# Patient Record
Sex: Male | Born: 1935 | Race: White | Hispanic: No | Marital: Married | Smoking: Former smoker
Health system: Southern US, Community
[De-identification: ages and names within clinical notes are randomized; demographics above are authoritative.]

## PROBLEM LIST (undated history)

## (undated) DIAGNOSIS — N390 Urinary tract infection, site not specified: Secondary | ICD-10-CM

## (undated) DIAGNOSIS — R079 Chest pain, unspecified: Secondary | ICD-10-CM

## (undated) DIAGNOSIS — N12 Tubulo-interstitial nephritis, not specified as acute or chronic: Secondary | ICD-10-CM

## (undated) DIAGNOSIS — I219 Acute myocardial infarction, unspecified: Secondary | ICD-10-CM

## (undated) DIAGNOSIS — L0233 Carbuncle of buttock: Secondary | ICD-10-CM

## (undated) DIAGNOSIS — I2510 Atherosclerotic heart disease of native coronary artery without angina pectoris: Secondary | ICD-10-CM

## (undated) DIAGNOSIS — L723 Sebaceous cyst: Secondary | ICD-10-CM

## (undated) DIAGNOSIS — M109 Gout, unspecified: Secondary | ICD-10-CM

## (undated) DIAGNOSIS — N179 Acute kidney failure, unspecified: Secondary | ICD-10-CM

## (undated) DIAGNOSIS — C4490 Unspecified malignant neoplasm of skin, unspecified: Secondary | ICD-10-CM

## (undated) DIAGNOSIS — F329 Major depressive disorder, single episode, unspecified: Secondary | ICD-10-CM

## (undated) DIAGNOSIS — I714 Abdominal aortic aneurysm, without rupture, unspecified: Secondary | ICD-10-CM

## (undated) DIAGNOSIS — M2550 Pain in unspecified joint: Secondary | ICD-10-CM

## (undated) DIAGNOSIS — N99528 Other complication of other external stoma of urinary tract: Secondary | ICD-10-CM

## (undated) DIAGNOSIS — C61 Malignant neoplasm of prostate: Secondary | ICD-10-CM

## (undated) DIAGNOSIS — K439 Ventral hernia without obstruction or gangrene: Secondary | ICD-10-CM

## (undated) DIAGNOSIS — L539 Erythematous condition, unspecified: Secondary | ICD-10-CM

## (undated) DIAGNOSIS — G609 Hereditary and idiopathic neuropathy, unspecified: Secondary | ICD-10-CM

## (undated) DIAGNOSIS — L210 Seborrhea capitis: Secondary | ICD-10-CM

## (undated) DIAGNOSIS — R609 Edema, unspecified: Secondary | ICD-10-CM

## (undated) DIAGNOSIS — K432 Incisional hernia without obstruction or gangrene: Secondary | ICD-10-CM

## (undated) DIAGNOSIS — G629 Polyneuropathy, unspecified: Secondary | ICD-10-CM

## (undated) DIAGNOSIS — A6920 Lyme disease, unspecified: Secondary | ICD-10-CM

## (undated) DIAGNOSIS — H259 Unspecified age-related cataract: Secondary | ICD-10-CM

## (undated) DIAGNOSIS — K9409 Other complications of colostomy: Secondary | ICD-10-CM

## (undated) DIAGNOSIS — J449 Chronic obstructive pulmonary disease, unspecified: Secondary | ICD-10-CM

## (undated) DIAGNOSIS — F32A Depression, unspecified: Secondary | ICD-10-CM

## (undated) DIAGNOSIS — K4090 Unilateral inguinal hernia, without obstruction or gangrene, not specified as recurrent: Secondary | ICD-10-CM

## (undated) DIAGNOSIS — L409 Psoriasis, unspecified: Secondary | ICD-10-CM

## (undated) DIAGNOSIS — D696 Thrombocytopenia, unspecified: Secondary | ICD-10-CM

## (undated) DIAGNOSIS — J069 Acute upper respiratory infection, unspecified: Secondary | ICD-10-CM

## (undated) DIAGNOSIS — IMO0002 Reserved for concepts with insufficient information to code with codable children: Secondary | ICD-10-CM

## (undated) DIAGNOSIS — N360 Urethral fistula: Secondary | ICD-10-CM

## (undated) DIAGNOSIS — M545 Low back pain: Secondary | ICD-10-CM

## (undated) DIAGNOSIS — R5381 Other malaise: Secondary | ICD-10-CM

## (undated) DIAGNOSIS — K5730 Diverticulosis of large intestine without perforation or abscess without bleeding: Secondary | ICD-10-CM

## (undated) DIAGNOSIS — R634 Abnormal weight loss: Secondary | ICD-10-CM

## (undated) DIAGNOSIS — R5383 Other fatigue: Secondary | ICD-10-CM

## (undated) DIAGNOSIS — E785 Hyperlipidemia, unspecified: Secondary | ICD-10-CM

## (undated) DIAGNOSIS — R001 Bradycardia, unspecified: Secondary | ICD-10-CM

## (undated) DIAGNOSIS — N321 Vesicointestinal fistula: Secondary | ICD-10-CM

## (undated) HISTORY — DX: Thrombocytopenia, unspecified: D69.6

## (undated) HISTORY — DX: Acute upper respiratory infection, unspecified: J06.9

## (undated) HISTORY — DX: Acute kidney failure, unspecified: N17.9

## (undated) HISTORY — DX: Incisional hernia without obstruction or gangrene: K43.2

## (undated) HISTORY — DX: Acute myocardial infarction, unspecified: I21.9

## (undated) HISTORY — DX: Abdominal aortic aneurysm, without rupture: I71.4

## (undated) HISTORY — DX: Other fatigue: R53.83

## (undated) HISTORY — DX: Diverticulosis of large intestine without perforation or abscess without bleeding: K57.30

## (undated) HISTORY — DX: Atherosclerotic heart disease of native coronary artery without angina pectoris: I25.10

## (undated) HISTORY — DX: Gout, unspecified: M10.9

## (undated) HISTORY — DX: Other malaise: R53.81

## (undated) HISTORY — DX: Pain in unspecified joint: M25.50

## (undated) HISTORY — DX: Erythematous condition, unspecified: L53.9

## (undated) HISTORY — DX: Vesicointestinal fistula: N32.1

## (undated) HISTORY — DX: Abnormal weight loss: R63.4

## (undated) HISTORY — DX: Edema, unspecified: R60.9

## (undated) HISTORY — DX: Carbuncle of buttock: L02.33

## (undated) HISTORY — DX: Ventral hernia without obstruction or gangrene: K43.9

## (undated) HISTORY — DX: Unilateral inguinal hernia, without obstruction or gangrene, not specified as recurrent: K40.90

## (undated) HISTORY — DX: Abdominal aortic aneurysm, without rupture, unspecified: I71.40

## (undated) HISTORY — DX: Sebaceous cyst: L72.3

## (undated) HISTORY — DX: Unspecified age-related cataract: H25.9

## (undated) HISTORY — DX: Lyme disease, unspecified: A69.20

## (undated) HISTORY — PX: EYE SURGERY: SHX253

## (undated) HISTORY — DX: Low back pain: M54.5

## (undated) HISTORY — DX: Seborrhea capitis: L21.0

## (undated) HISTORY — DX: Reserved for concepts with insufficient information to code with codable children: IMO0002

## (undated) HISTORY — DX: Tubulo-interstitial nephritis, not specified as acute or chronic: N12

## (undated) HISTORY — DX: Other complication of incontinent external stoma of urinary tract: N99.528

## (undated) HISTORY — DX: Chest pain, unspecified: R07.9

## (undated) HISTORY — DX: Hereditary and idiopathic neuropathy, unspecified: G60.9

---
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
## 1968-11-29 HISTORY — PX: HERNIA REPAIR: SHX51

## 1987-11-30 DIAGNOSIS — A6920 Lyme disease, unspecified: Secondary | ICD-10-CM

## 1987-11-30 HISTORY — DX: Lyme disease, unspecified: A69.20

## 1995-11-30 DIAGNOSIS — I219 Acute myocardial infarction, unspecified: Secondary | ICD-10-CM

## 1995-11-30 HISTORY — DX: Acute myocardial infarction, unspecified: I21.9

## 1996-08-10 DIAGNOSIS — I2510 Atherosclerotic heart disease of native coronary artery without angina pectoris: Secondary | ICD-10-CM

## 1996-08-10 HISTORY — DX: Atherosclerotic heart disease of native coronary artery without angina pectoris: I25.10

## 1998-04-29 ENCOUNTER — Ambulatory Visit (HOSPITAL_COMMUNITY): Admission: RE | Admit: 1998-04-29 | Discharge: 1998-04-29 | Payer: Self-pay | Admitting: Internal Medicine

## 1998-05-08 ENCOUNTER — Ambulatory Visit (HOSPITAL_COMMUNITY): Admission: RE | Admit: 1998-05-08 | Discharge: 1998-05-08 | Payer: Self-pay | Admitting: Internal Medicine

## 1999-03-27 ENCOUNTER — Other Ambulatory Visit: Admission: RE | Admit: 1999-03-27 | Discharge: 1999-03-27 | Payer: Self-pay | Admitting: Urology

## 1999-04-08 ENCOUNTER — Encounter: Admission: RE | Admit: 1999-04-08 | Discharge: 1999-06-29 | Payer: Self-pay | Admitting: Radiation Oncology

## 1999-06-30 ENCOUNTER — Encounter: Admission: RE | Admit: 1999-06-30 | Discharge: 1999-09-28 | Payer: Self-pay | Admitting: Radiation Oncology

## 1999-07-07 ENCOUNTER — Encounter: Payer: Self-pay | Admitting: Urology

## 1999-07-07 ENCOUNTER — Ambulatory Visit (HOSPITAL_BASED_OUTPATIENT_CLINIC_OR_DEPARTMENT_OTHER): Admission: RE | Admit: 1999-07-07 | Discharge: 1999-07-07 | Payer: Self-pay | Admitting: Urology

## 1999-07-28 ENCOUNTER — Ambulatory Visit (HOSPITAL_COMMUNITY): Admission: RE | Admit: 1999-07-28 | Discharge: 1999-07-28 | Payer: Self-pay | Admitting: Radiation Oncology

## 1999-08-10 ENCOUNTER — Ambulatory Visit (HOSPITAL_COMMUNITY): Admission: RE | Admit: 1999-08-10 | Discharge: 1999-08-10 | Payer: Self-pay | Admitting: Radiation Oncology

## 1999-12-09 ENCOUNTER — Encounter: Admission: RE | Admit: 1999-12-09 | Discharge: 2000-03-08 | Payer: Self-pay | Admitting: Radiation Oncology

## 1999-12-15 ENCOUNTER — Ambulatory Visit (HOSPITAL_BASED_OUTPATIENT_CLINIC_OR_DEPARTMENT_OTHER): Admission: RE | Admit: 1999-12-15 | Discharge: 1999-12-15 | Payer: Self-pay | Admitting: Urology

## 1999-12-15 ENCOUNTER — Encounter: Payer: Self-pay | Admitting: Urology

## 2000-07-07 ENCOUNTER — Ambulatory Visit (HOSPITAL_COMMUNITY): Admission: RE | Admit: 2000-07-07 | Discharge: 2000-07-07 | Payer: Self-pay | Admitting: Gastroenterology

## 2000-08-10 DIAGNOSIS — G609 Hereditary and idiopathic neuropathy, unspecified: Secondary | ICD-10-CM

## 2000-08-10 HISTORY — DX: Hereditary and idiopathic neuropathy, unspecified: G60.9

## 2000-11-29 DIAGNOSIS — N321 Vesicointestinal fistula: Secondary | ICD-10-CM

## 2000-11-29 HISTORY — PX: ILEOSTOMY: SHX1783

## 2000-11-29 HISTORY — DX: Vesicointestinal fistula: N32.1

## 2000-11-29 HISTORY — PX: OTHER SURGICAL HISTORY: SHX169

## 2000-12-01 ENCOUNTER — Ambulatory Visit (HOSPITAL_COMMUNITY): Admission: RE | Admit: 2000-12-01 | Discharge: 2000-12-01 | Payer: Self-pay | Admitting: Gastroenterology

## 2000-12-23 ENCOUNTER — Ambulatory Visit (HOSPITAL_COMMUNITY): Admission: RE | Admit: 2000-12-23 | Discharge: 2000-12-23 | Payer: Self-pay | Admitting: Gastroenterology

## 2001-01-11 ENCOUNTER — Ambulatory Visit (HOSPITAL_COMMUNITY): Admission: RE | Admit: 2001-01-11 | Discharge: 2001-01-11 | Payer: Self-pay | Admitting: Gastroenterology

## 2001-02-20 ENCOUNTER — Encounter: Admission: RE | Admit: 2001-02-20 | Discharge: 2001-05-21 | Payer: Self-pay | Admitting: Anesthesiology

## 2001-03-12 ENCOUNTER — Encounter: Payer: Self-pay | Admitting: Emergency Medicine

## 2001-03-12 ENCOUNTER — Emergency Department (HOSPITAL_COMMUNITY): Admission: EM | Admit: 2001-03-12 | Discharge: 2001-03-12 | Payer: Self-pay | Admitting: Emergency Medicine

## 2001-03-20 ENCOUNTER — Ambulatory Visit (HOSPITAL_COMMUNITY): Admission: RE | Admit: 2001-03-20 | Discharge: 2001-03-20 | Payer: Self-pay | Admitting: Gastroenterology

## 2001-03-20 ENCOUNTER — Encounter (INDEPENDENT_AMBULATORY_CARE_PROVIDER_SITE_OTHER): Payer: Self-pay

## 2001-03-28 ENCOUNTER — Encounter (INDEPENDENT_AMBULATORY_CARE_PROVIDER_SITE_OTHER): Payer: Self-pay | Admitting: Specialist

## 2001-03-29 ENCOUNTER — Encounter: Payer: Self-pay | Admitting: Urology

## 2001-03-29 ENCOUNTER — Inpatient Hospital Stay (HOSPITAL_COMMUNITY): Admission: EM | Admit: 2001-03-29 | Discharge: 2001-04-01 | Payer: Self-pay | Admitting: Emergency Medicine

## 2001-06-17 ENCOUNTER — Emergency Department (HOSPITAL_COMMUNITY): Admission: EM | Admit: 2001-06-17 | Discharge: 2001-06-17 | Payer: Self-pay | Admitting: Emergency Medicine

## 2001-06-29 ENCOUNTER — Inpatient Hospital Stay (HOSPITAL_COMMUNITY): Admission: RE | Admit: 2001-06-29 | Discharge: 2001-07-20 | Payer: Self-pay | Admitting: Urology

## 2001-06-29 ENCOUNTER — Encounter (INDEPENDENT_AMBULATORY_CARE_PROVIDER_SITE_OTHER): Payer: Self-pay | Admitting: Specialist

## 2001-06-29 ENCOUNTER — Encounter: Payer: Self-pay | Admitting: Urology

## 2001-07-03 ENCOUNTER — Encounter: Payer: Self-pay | Admitting: *Deleted

## 2001-07-04 HISTORY — PX: CARDIAC CATHETERIZATION: SHX172

## 2001-07-05 ENCOUNTER — Encounter: Payer: Self-pay | Admitting: Urology

## 2001-07-06 ENCOUNTER — Encounter: Payer: Self-pay | Admitting: Anesthesiology

## 2001-07-08 ENCOUNTER — Encounter: Payer: Self-pay | Admitting: Pulmonary Disease

## 2001-07-25 ENCOUNTER — Encounter: Payer: Self-pay | Admitting: General Surgery

## 2001-07-25 ENCOUNTER — Inpatient Hospital Stay (HOSPITAL_COMMUNITY): Admission: EM | Admit: 2001-07-25 | Discharge: 2001-08-25 | Payer: Self-pay | Admitting: Emergency Medicine

## 2001-07-26 ENCOUNTER — Encounter: Payer: Self-pay | Admitting: General Surgery

## 2001-07-27 ENCOUNTER — Encounter: Payer: Self-pay | Admitting: General Surgery

## 2001-07-28 ENCOUNTER — Encounter: Payer: Self-pay | Admitting: General Surgery

## 2001-07-29 ENCOUNTER — Encounter: Payer: Self-pay | Admitting: General Surgery

## 2001-07-30 ENCOUNTER — Encounter: Payer: Self-pay | Admitting: General Surgery

## 2001-08-01 ENCOUNTER — Encounter: Payer: Self-pay | Admitting: General Surgery

## 2001-08-02 ENCOUNTER — Encounter: Payer: Self-pay | Admitting: General Surgery

## 2001-08-03 ENCOUNTER — Encounter: Payer: Self-pay | Admitting: General Surgery

## 2001-08-03 HISTORY — PX: COLOSTOMY: SHX63

## 2001-08-05 ENCOUNTER — Encounter: Payer: Self-pay | Admitting: General Surgery

## 2001-08-08 ENCOUNTER — Encounter: Payer: Self-pay | Admitting: Urology

## 2001-08-21 ENCOUNTER — Encounter: Payer: Self-pay | Admitting: Urology

## 2003-09-25 DIAGNOSIS — M545 Low back pain, unspecified: Secondary | ICD-10-CM

## 2003-09-25 DIAGNOSIS — R609 Edema, unspecified: Secondary | ICD-10-CM

## 2003-09-25 HISTORY — DX: Low back pain, unspecified: M54.50

## 2003-09-25 HISTORY — DX: Edema, unspecified: R60.9

## 2005-02-11 DIAGNOSIS — K5730 Diverticulosis of large intestine without perforation or abscess without bleeding: Secondary | ICD-10-CM

## 2005-02-11 HISTORY — DX: Diverticulosis of large intestine without perforation or abscess without bleeding: K57.30

## 2005-06-04 ENCOUNTER — Inpatient Hospital Stay (HOSPITAL_COMMUNITY): Admission: EM | Admit: 2005-06-04 | Discharge: 2005-06-09 | Payer: Self-pay | Admitting: Emergency Medicine

## 2005-08-30 DIAGNOSIS — M2550 Pain in unspecified joint: Secondary | ICD-10-CM

## 2005-08-30 HISTORY — DX: Pain in unspecified joint: M25.50

## 2006-02-18 DIAGNOSIS — H259 Unspecified age-related cataract: Secondary | ICD-10-CM

## 2006-02-18 HISTORY — DX: Unspecified age-related cataract: H25.9

## 2006-07-22 ENCOUNTER — Emergency Department (HOSPITAL_COMMUNITY): Admission: EM | Admit: 2006-07-22 | Discharge: 2006-07-22 | Payer: Self-pay | Admitting: Emergency Medicine

## 2007-09-22 DIAGNOSIS — R5381 Other malaise: Secondary | ICD-10-CM

## 2007-09-22 HISTORY — DX: Other malaise: R53.81

## 2007-11-30 HISTORY — PX: OTHER SURGICAL HISTORY: SHX169

## 2008-02-15 DIAGNOSIS — M109 Gout, unspecified: Secondary | ICD-10-CM

## 2008-02-15 HISTORY — DX: Gout, unspecified: M10.9

## 2008-03-20 DIAGNOSIS — K432 Incisional hernia without obstruction or gangrene: Secondary | ICD-10-CM

## 2008-03-20 HISTORY — DX: Incisional hernia without obstruction or gangrene: K43.2

## 2008-11-29 HISTORY — PX: OTHER SURGICAL HISTORY: SHX169

## 2008-12-04 DIAGNOSIS — K439 Ventral hernia without obstruction or gangrene: Secondary | ICD-10-CM

## 2008-12-04 HISTORY — DX: Ventral hernia without obstruction or gangrene: K43.9

## 2009-01-08 DIAGNOSIS — L0233 Carbuncle of buttock: Secondary | ICD-10-CM

## 2009-01-08 HISTORY — DX: Carbuncle of buttock: L02.33

## 2009-01-22 DIAGNOSIS — L723 Sebaceous cyst: Secondary | ICD-10-CM

## 2009-01-22 HISTORY — DX: Sebaceous cyst: L72.3

## 2009-10-02 DIAGNOSIS — J069 Acute upper respiratory infection, unspecified: Secondary | ICD-10-CM

## 2009-10-02 HISTORY — DX: Acute upper respiratory infection, unspecified: J06.9

## 2009-12-04 ENCOUNTER — Inpatient Hospital Stay (HOSPITAL_COMMUNITY): Admission: EM | Admit: 2009-12-04 | Discharge: 2009-12-22 | Payer: Self-pay | Admitting: Emergency Medicine

## 2010-10-31 IMAGING — CR DG ABDOMEN 2V
4 series · 4 of 4 positions shown · non-contrast
Comparison: Abdomen films of 12/14/2009

CLINICAL DATA: Abdominal pain, small bowel obstruction

ABDOMEN - 2 VIEW

[w abdomen upright *]
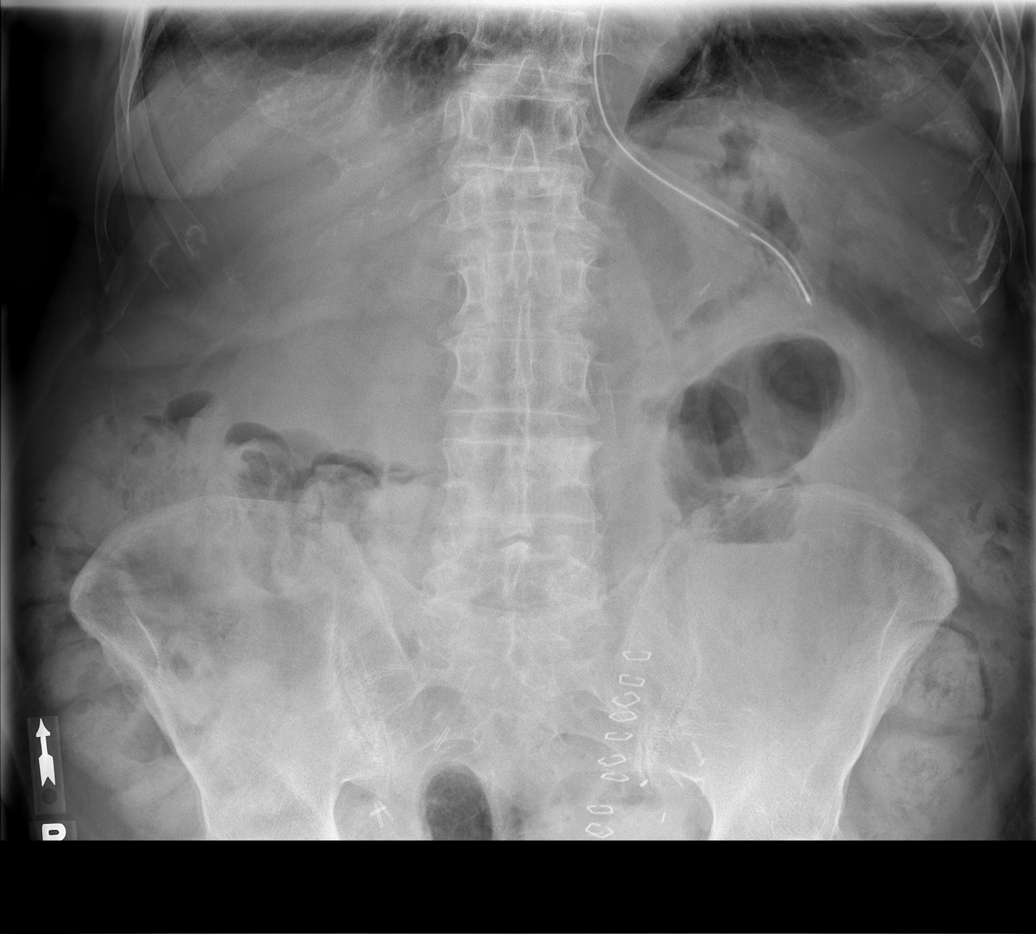

[t abdomen supine (1 of 2)]
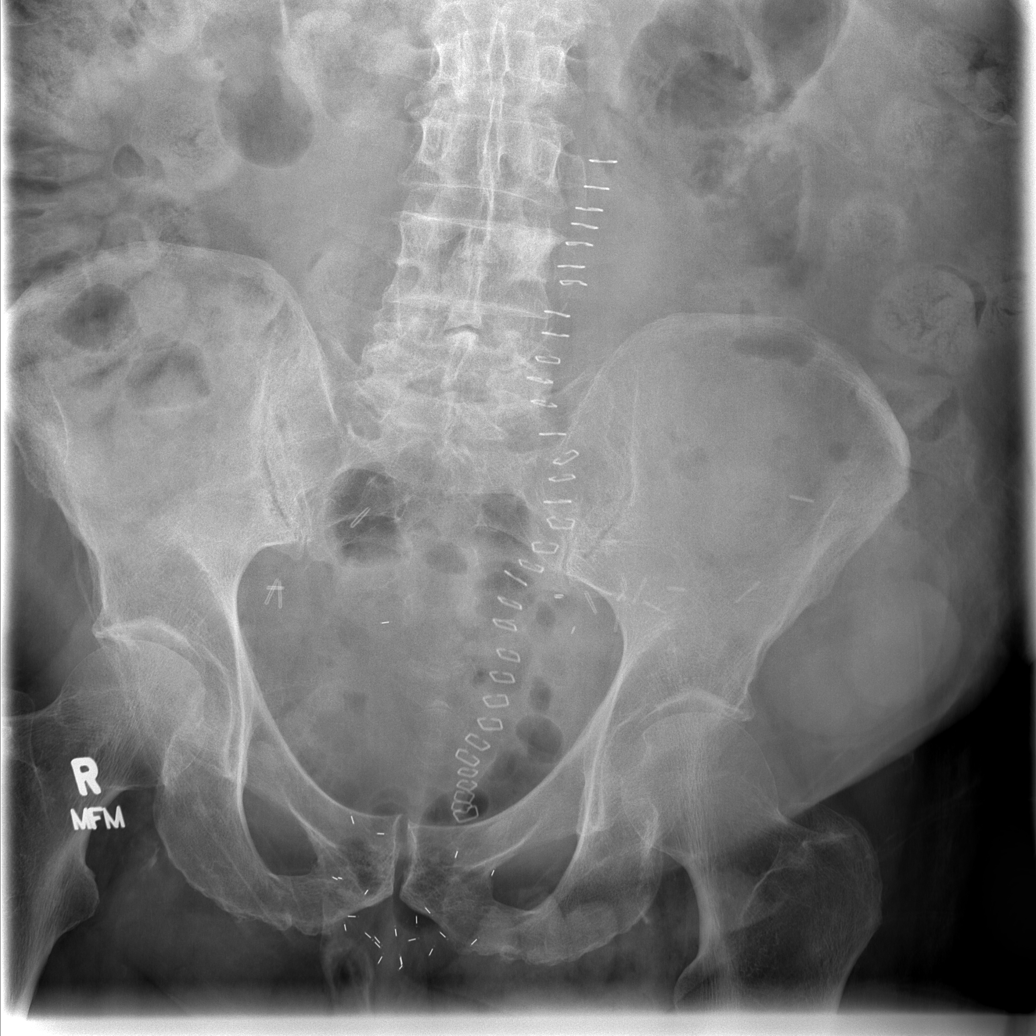

[t abdomen supine (2 of 2)]
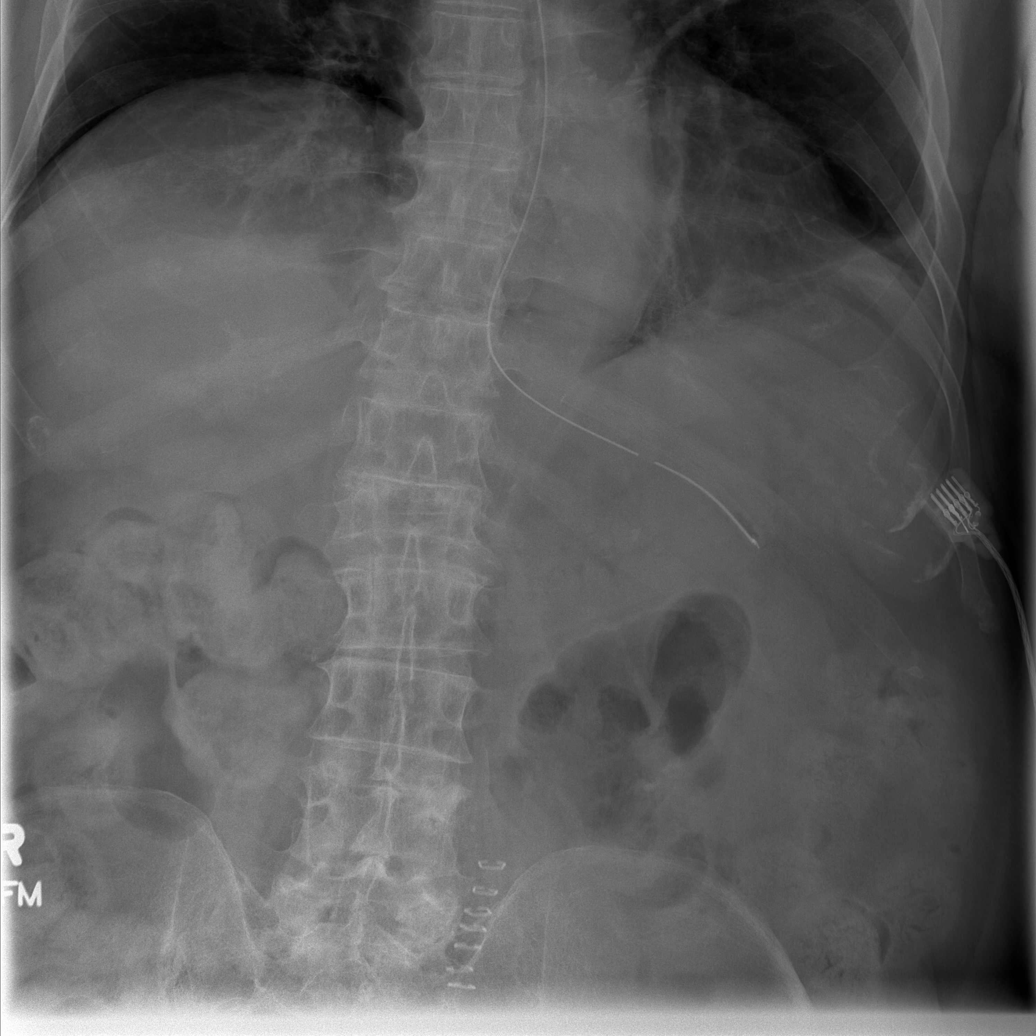

[view not recorded]
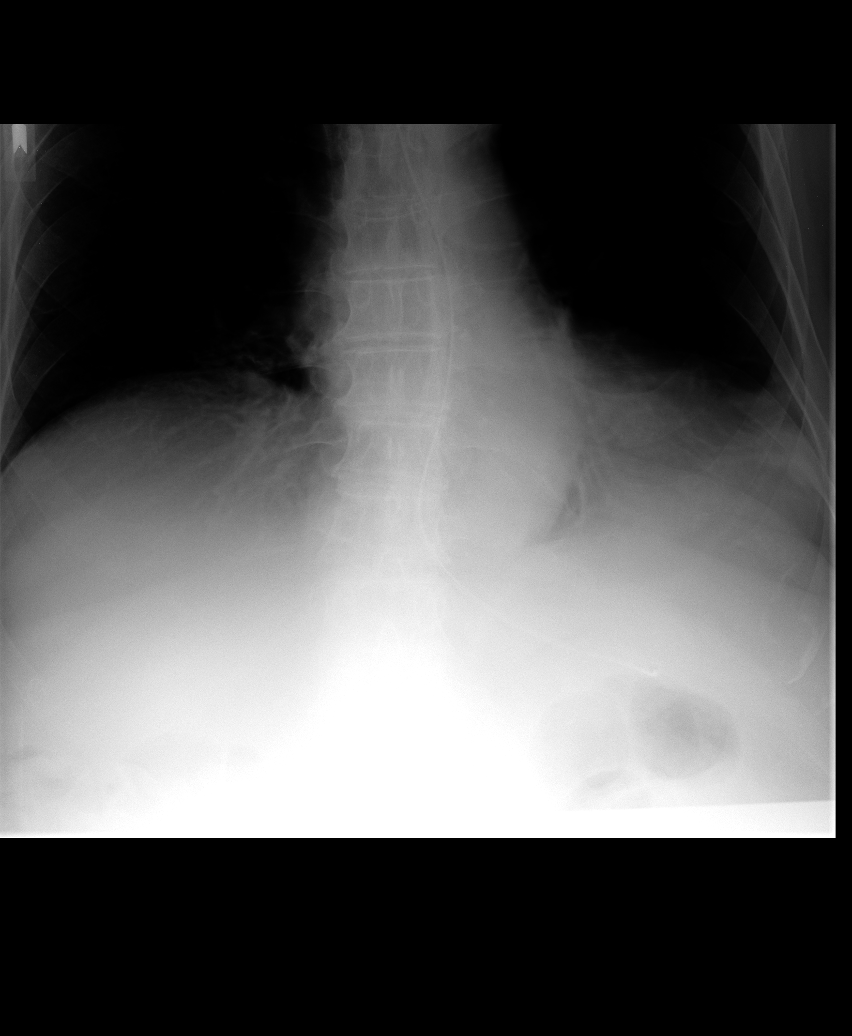

[4 of 4 positions shown; findings below may reference images not displayed]

FINDINGS: An NG tube remains with the tip in the proximal body of
the stomach.  No evidence of small bowel obstruction is currently
seen.  A small amount of colonic bowel gas is present, with feces
noted throughout the entire colon.  Surgical staples are noted in
the mid pelvis and lower abdomen with prostate implant seeds noted
in the mid pelvis as well.  Mild bibasilar linear atelectasis is
present.
IMPRESSION: NG tube remains with tip in the proximal body of the stomach.
Resolution of SBO.  No free air.

## 2011-02-13 LAB — CBC
HCT: 38.6 % — ABNORMAL LOW (ref 39.0–52.0)
HCT: 42.2 % (ref 39.0–52.0)
HCT: 43.1 % (ref 39.0–52.0)
Hemoglobin: 12.4 g/dL — ABNORMAL LOW (ref 13.0–17.0)
Hemoglobin: 12.9 g/dL — ABNORMAL LOW (ref 13.0–17.0)
Hemoglobin: 13.3 g/dL (ref 13.0–17.0)
Hemoglobin: 14.1 g/dL (ref 13.0–17.0)
Hemoglobin: 14.6 g/dL (ref 13.0–17.0)
Hemoglobin: 17 g/dL (ref 13.0–17.0)
MCHC: 33.4 g/dL (ref 30.0–36.0)
MCHC: 33.6 g/dL (ref 30.0–36.0)
MCHC: 33.7 g/dL (ref 30.0–36.0)
MCHC: 33.7 g/dL (ref 30.0–36.0)
MCHC: 33.9 g/dL (ref 30.0–36.0)
MCV: 96.2 fL (ref 78.0–100.0)
MCV: 96.4 fL (ref 78.0–100.0)
Platelets: 153 10*3/uL (ref 150–400)
Platelets: 161 10*3/uL (ref 150–400)
RBC: 3.89 MIL/uL — ABNORMAL LOW (ref 4.22–5.81)
RBC: 4.38 MIL/uL (ref 4.22–5.81)
RBC: 4.49 MIL/uL (ref 4.22–5.81)
RBC: 4.51 MIL/uL (ref 4.22–5.81)
RDW: 13.7 % (ref 11.5–15.5)
RDW: 13.8 % (ref 11.5–15.5)
RDW: 13.8 % (ref 11.5–15.5)
RDW: 13.9 % (ref 11.5–15.5)
RDW: 13.9 % (ref 11.5–15.5)
RDW: 14.1 % (ref 11.5–15.5)
WBC: 5.9 10*3/uL (ref 4.0–10.5)
WBC: 6 10*3/uL (ref 4.0–10.5)
WBC: 8.8 10*3/uL (ref 4.0–10.5)

## 2011-02-13 LAB — TYPE AND SCREEN
ABO/RH(D): AB POS
Antibody Screen: NEGATIVE

## 2011-02-13 LAB — LIPID PANEL
Cholesterol: 118 mg/dL (ref 0–200)
HDL: 45 mg/dL (ref >39)
LDL Cholesterol: 53 mg/dL (ref 0–99)
Total CHOL/HDL Ratio: 2.6 RATIO
Triglycerides: 102 mg/dL (ref <150)
VLDL: 20 mg/dL (ref 0–40)

## 2011-02-13 LAB — COMPREHENSIVE METABOLIC PANEL
ALT: 14 U/L (ref 0–53)
AST: 22 U/L (ref 0–37)
AST: 30 U/L (ref 0–37)
Albumin: 4.5 g/dL (ref 3.5–5.2)
Alkaline Phosphatase: 24 U/L — ABNORMAL LOW (ref 39–117)
Alkaline Phosphatase: 46 U/L (ref 39–117)
CO2: 28 mEq/L (ref 19–32)
CO2: 30 mEq/L (ref 19–32)
Creatinine, Ser: 1.45 mg/dL (ref 0.4–1.5)
GFR calc Af Amer: 57 mL/min — ABNORMAL LOW (ref >60)
GFR calc Af Amer: >60 mL/min (ref >60)
Glucose, Bld: 157 mg/dL — ABNORMAL HIGH (ref 70–99)
Potassium: 3.3 mEq/L — ABNORMAL LOW (ref 3.5–5.1)
Potassium: 4.6 mEq/L (ref 3.5–5.1)
Sodium: 139 mEq/L (ref 135–145)
Total Bilirubin: 0.9 mg/dL (ref 0.3–1.2)
Total Protein: 5.2 g/dL — ABNORMAL LOW (ref 6.0–8.3)

## 2011-02-13 LAB — URINALYSIS, ROUTINE W REFLEX MICROSCOPIC
Bilirubin Urine: NEGATIVE
Specific Gravity, Urine: 1.029 (ref 1.005–1.030)

## 2011-02-13 LAB — DIFFERENTIAL
Basophils Absolute: 0 10*3/uL (ref 0.0–0.1)
Basophils Absolute: 0 10*3/uL (ref 0.0–0.1)
Basophils Relative: 0 % (ref 0–1)
Eosinophils Absolute: 0 10*3/uL (ref 0.0–0.7)
Eosinophils Absolute: 0 10*3/uL (ref 0.0–0.7)
Eosinophils Relative: 0 % (ref 0–5)
Eosinophils Relative: 0 % (ref 0–5)
Eosinophils Relative: 1 % (ref 0–5)
Lymphocytes Relative: 13 % (ref 12–46)
Lymphocytes Relative: 8 % — ABNORMAL LOW (ref 12–46)
Lymphs Abs: 0.7 10*3/uL (ref 0.7–4.0)
Lymphs Abs: 0.8 10*3/uL (ref 0.7–4.0)
Lymphs Abs: 1 10*3/uL (ref 0.7–4.0)
Monocytes Relative: 5 % (ref 3–12)
Monocytes Relative: 9 % (ref 3–12)
Neutrophils Relative %: 70 % (ref 43–77)
Neutrophils Relative %: 73 % (ref 43–77)
Neutrophils Relative %: 87 % — ABNORMAL HIGH (ref 43–77)

## 2011-02-13 LAB — BASIC METABOLIC PANEL
BUN: 23 mg/dL (ref 6–23)
CO2: 26 mEq/L (ref 19–32)
CO2: 27 mEq/L (ref 19–32)
CO2: 27 mEq/L (ref 19–32)
CO2: 28 mEq/L (ref 19–32)
CO2: 33 mEq/L — ABNORMAL HIGH (ref 19–32)
Calcium: 8 mg/dL — ABNORMAL LOW (ref 8.4–10.5)
Calcium: 8 mg/dL — ABNORMAL LOW (ref 8.4–10.5)
Calcium: 8.2 mg/dL — ABNORMAL LOW (ref 8.4–10.5)
Calcium: 8.7 mg/dL (ref 8.4–10.5)
Calcium: 8.7 mg/dL (ref 8.4–10.5)
Calcium: 8.7 mg/dL (ref 8.4–10.5)
Chloride: 103 mEq/L (ref 96–112)
Chloride: 105 mEq/L (ref 96–112)
Chloride: 107 mEq/L (ref 96–112)
Creatinine, Ser: 1.32 mg/dL (ref 0.4–1.5)
Creatinine, Ser: 1.35 mg/dL (ref 0.4–1.5)
Creatinine, Ser: 1.45 mg/dL (ref 0.4–1.5)
GFR calc Af Amer: 55 mL/min — ABNORMAL LOW (ref >60)
GFR calc Af Amer: 57 mL/min — ABNORMAL LOW (ref >60)
GFR calc Af Amer: >60 mL/min (ref >60)
GFR calc Af Amer: >60 mL/min (ref >60)
GFR calc Af Amer: >60 mL/min (ref >60)
GFR calc Af Amer: >60 mL/min (ref >60)
GFR calc non Af Amer: 51 mL/min — ABNORMAL LOW (ref >60)
GFR calc non Af Amer: 53 mL/min — ABNORMAL LOW (ref >60)
GFR calc non Af Amer: 55 mL/min — ABNORMAL LOW (ref >60)
GFR calc non Af Amer: >60 mL/min (ref >60)
Glucose, Bld: 104 mg/dL — ABNORMAL HIGH (ref 70–99)
Glucose, Bld: 124 mg/dL — ABNORMAL HIGH (ref 70–99)
Glucose, Bld: 146 mg/dL — ABNORMAL HIGH (ref 70–99)
Glucose, Bld: 146 mg/dL — ABNORMAL HIGH (ref 70–99)
Glucose, Bld: 147 mg/dL — ABNORMAL HIGH (ref 70–99)
Potassium: 3.6 mEq/L (ref 3.5–5.1)
Potassium: 3.7 mEq/L (ref 3.5–5.1)
Potassium: 3.8 mEq/L (ref 3.5–5.1)
Sodium: 138 mEq/L (ref 135–145)
Sodium: 139 mEq/L (ref 135–145)
Sodium: 139 mEq/L (ref 135–145)
Sodium: 139 mEq/L (ref 135–145)
Sodium: 140 mEq/L (ref 135–145)
Sodium: 141 mEq/L (ref 135–145)

## 2011-02-13 LAB — TRIGLYCERIDES: Triglycerides: 100 mg/dL (ref <150)

## 2011-02-13 LAB — GLUCOSE, CAPILLARY
Glucose-Capillary: 106 mg/dL — ABNORMAL HIGH (ref 70–99)
Glucose-Capillary: 115 mg/dL — ABNORMAL HIGH (ref 70–99)
Glucose-Capillary: 116 mg/dL — ABNORMAL HIGH (ref 70–99)
Glucose-Capillary: 120 mg/dL — ABNORMAL HIGH (ref 70–99)
Glucose-Capillary: 121 mg/dL — ABNORMAL HIGH (ref 70–99)
Glucose-Capillary: 121 mg/dL — ABNORMAL HIGH (ref 70–99)
Glucose-Capillary: 122 mg/dL — ABNORMAL HIGH (ref 70–99)
Glucose-Capillary: 134 mg/dL — ABNORMAL HIGH (ref 70–99)
Glucose-Capillary: 135 mg/dL — ABNORMAL HIGH (ref 70–99)
Glucose-Capillary: 136 mg/dL — ABNORMAL HIGH (ref 70–99)
Glucose-Capillary: 137 mg/dL — ABNORMAL HIGH (ref 70–99)
Glucose-Capillary: 142 mg/dL — ABNORMAL HIGH (ref 70–99)
Glucose-Capillary: 93 mg/dL (ref 70–99)

## 2011-02-13 LAB — PROTIME-INR: INR: 1.14 (ref 0.00–1.49)

## 2011-02-13 LAB — HEMOGLOBIN A1C
Hgb A1c MFr Bld: 5.9 % (ref 4.6–6.1)
Mean Plasma Glucose: 123 mg/dL

## 2011-02-13 LAB — PREALBUMIN: Prealbumin: 10.4 mg/dL — ABNORMAL LOW (ref 18.0–45.0)

## 2011-02-14 LAB — BASIC METABOLIC PANEL
BUN: 24 mg/dL — ABNORMAL HIGH (ref 6–23)
BUN: 26 mg/dL — ABNORMAL HIGH (ref 6–23)
BUN: 30 mg/dL — ABNORMAL HIGH (ref 6–23)
CO2: 28 mEq/L (ref 19–32)
CO2: 29 mEq/L (ref 19–32)
Calcium: 8.3 mg/dL — ABNORMAL LOW (ref 8.4–10.5)
Calcium: 8.3 mg/dL — ABNORMAL LOW (ref 8.4–10.5)
Calcium: 8.5 mg/dL (ref 8.4–10.5)
Calcium: 8.7 mg/dL (ref 8.4–10.5)
Calcium: 8.9 mg/dL (ref 8.4–10.5)
Chloride: 108 mEq/L (ref 96–112)
Chloride: 99 mEq/L (ref 96–112)
Creatinine, Ser: 0.97 mg/dL (ref 0.4–1.5)
Creatinine, Ser: 0.99 mg/dL (ref 0.4–1.5)
Creatinine, Ser: 1.12 mg/dL (ref 0.4–1.5)
Creatinine, Ser: 1.34 mg/dL (ref 0.4–1.5)
GFR calc Af Amer: >60 mL/min (ref >60)
GFR calc Af Amer: >60 mL/min (ref >60)
GFR calc Af Amer: >60 mL/min (ref >60)
GFR calc Af Amer: >60 mL/min (ref >60)
GFR calc non Af Amer: >60 mL/min (ref >60)
GFR calc non Af Amer: >60 mL/min (ref >60)
GFR calc non Af Amer: >60 mL/min (ref >60)
GFR calc non Af Amer: >60 mL/min (ref >60)
GFR calc non Af Amer: >60 mL/min (ref >60)
Glucose, Bld: 125 mg/dL — ABNORMAL HIGH (ref 70–99)
Glucose, Bld: 164 mg/dL — ABNORMAL HIGH (ref 70–99)
Glucose, Bld: 194 mg/dL — ABNORMAL HIGH (ref 70–99)
Glucose, Bld: 194 mg/dL — ABNORMAL HIGH (ref 70–99)
Potassium: 3.9 mEq/L (ref 3.5–5.1)
Potassium: 4.4 mEq/L (ref 3.5–5.1)
Sodium: 133 mEq/L — ABNORMAL LOW (ref 135–145)
Sodium: 137 mEq/L (ref 135–145)
Sodium: 140 mEq/L (ref 135–145)

## 2011-02-14 LAB — GLUCOSE, CAPILLARY
Glucose-Capillary: 106 mg/dL — ABNORMAL HIGH (ref 70–99)
Glucose-Capillary: 112 mg/dL — ABNORMAL HIGH (ref 70–99)
Glucose-Capillary: 113 mg/dL — ABNORMAL HIGH (ref 70–99)
Glucose-Capillary: 114 mg/dL — ABNORMAL HIGH (ref 70–99)
Glucose-Capillary: 117 mg/dL — ABNORMAL HIGH (ref 70–99)
Glucose-Capillary: 118 mg/dL — ABNORMAL HIGH (ref 70–99)
Glucose-Capillary: 125 mg/dL — ABNORMAL HIGH (ref 70–99)
Glucose-Capillary: 126 mg/dL — ABNORMAL HIGH (ref 70–99)
Glucose-Capillary: 127 mg/dL — ABNORMAL HIGH (ref 70–99)
Glucose-Capillary: 129 mg/dL — ABNORMAL HIGH (ref 70–99)
Glucose-Capillary: 130 mg/dL — ABNORMAL HIGH (ref 70–99)
Glucose-Capillary: 130 mg/dL — ABNORMAL HIGH (ref 70–99)
Glucose-Capillary: 136 mg/dL — ABNORMAL HIGH (ref 70–99)
Glucose-Capillary: 138 mg/dL — ABNORMAL HIGH (ref 70–99)
Glucose-Capillary: 138 mg/dL — ABNORMAL HIGH (ref 70–99)
Glucose-Capillary: 138 mg/dL — ABNORMAL HIGH (ref 70–99)
Glucose-Capillary: 138 mg/dL — ABNORMAL HIGH (ref 70–99)
Glucose-Capillary: 139 mg/dL — ABNORMAL HIGH (ref 70–99)
Glucose-Capillary: 139 mg/dL — ABNORMAL HIGH (ref 70–99)
Glucose-Capillary: 140 mg/dL — ABNORMAL HIGH (ref 70–99)
Glucose-Capillary: 142 mg/dL — ABNORMAL HIGH (ref 70–99)
Glucose-Capillary: 143 mg/dL — ABNORMAL HIGH (ref 70–99)
Glucose-Capillary: 148 mg/dL — ABNORMAL HIGH (ref 70–99)
Glucose-Capillary: 148 mg/dL — ABNORMAL HIGH (ref 70–99)
Glucose-Capillary: 156 mg/dL — ABNORMAL HIGH (ref 70–99)
Glucose-Capillary: 169 mg/dL — ABNORMAL HIGH (ref 70–99)
Glucose-Capillary: 170 mg/dL — ABNORMAL HIGH (ref 70–99)
Glucose-Capillary: 172 mg/dL — ABNORMAL HIGH (ref 70–99)
Glucose-Capillary: 188 mg/dL — ABNORMAL HIGH (ref 70–99)

## 2011-02-14 LAB — CBC
Hemoglobin: 12.5 g/dL — ABNORMAL LOW (ref 13.0–17.0)
MCHC: 33.3 g/dL (ref 30.0–36.0)
MCV: 94.7 fL (ref 78.0–100.0)
MCV: 94.9 fL (ref 78.0–100.0)
Platelets: 316 10*3/uL (ref 150–400)
RBC: 3.89 MIL/uL — ABNORMAL LOW (ref 4.22–5.81)
WBC: 6.4 10*3/uL (ref 4.0–10.5)
WBC: 7.1 10*3/uL (ref 4.0–10.5)

## 2011-02-14 LAB — DIFFERENTIAL
Lymphs Abs: 0.8 10*3/uL (ref 0.7–4.0)
Monocytes Absolute: 0.6 10*3/uL (ref 0.1–1.0)
Monocytes Relative: 10 % (ref 3–12)
Neutro Abs: 5 10*3/uL (ref 1.7–7.7)
Neutrophils Relative %: 78 % — ABNORMAL HIGH (ref 43–77)

## 2011-02-14 LAB — COMPREHENSIVE METABOLIC PANEL
ALT: 16 U/L (ref 0–53)
Albumin: 2.4 g/dL — ABNORMAL LOW (ref 3.5–5.2)
Alkaline Phosphatase: 29 U/L — ABNORMAL LOW (ref 39–117)
Alkaline Phosphatase: 30 U/L — ABNORMAL LOW (ref 39–117)
BUN: 25 mg/dL — ABNORMAL HIGH (ref 6–23)
BUN: 26 mg/dL — ABNORMAL HIGH (ref 6–23)
CO2: 26 mEq/L (ref 19–32)
CO2: 30 mEq/L (ref 19–32)
Calcium: 8.2 mg/dL — ABNORMAL LOW (ref 8.4–10.5)
Calcium: 8.3 mg/dL — ABNORMAL LOW (ref 8.4–10.5)
Chloride: 105 mEq/L (ref 96–112)
Creatinine, Ser: 1.02 mg/dL (ref 0.4–1.5)
GFR calc Af Amer: >60 mL/min (ref >60)
GFR calc non Af Amer: >60 mL/min (ref >60)
Glucose, Bld: 140 mg/dL — ABNORMAL HIGH (ref 70–99)
Glucose, Bld: 146 mg/dL — ABNORMAL HIGH (ref 70–99)
Glucose, Bld: 147 mg/dL — ABNORMAL HIGH (ref 70–99)
Potassium: 3.6 mEq/L (ref 3.5–5.1)
Potassium: 4 mEq/L (ref 3.5–5.1)
Sodium: 140 mEq/L (ref 135–145)
Total Bilirubin: 0.2 mg/dL — ABNORMAL LOW (ref 0.3–1.2)
Total Protein: 5.3 g/dL — ABNORMAL LOW (ref 6.0–8.3)
Total Protein: 5.4 g/dL — ABNORMAL LOW (ref 6.0–8.3)

## 2011-02-14 LAB — PREALBUMIN: Prealbumin: 15.9 mg/dL — ABNORMAL LOW (ref 18.0–45.0)

## 2011-02-14 LAB — PHOSPHORUS
Phosphorus: 3.6 mg/dL (ref 2.3–4.6)
Phosphorus: 3.8 mg/dL (ref 2.3–4.6)

## 2011-02-14 LAB — MAGNESIUM: Magnesium: 2.1 mg/dL (ref 1.5–2.5)

## 2011-04-16 NOTE — H&P (Signed)
[facility]  Patient:    Michael Novak, Michael Novak                      MRN: [id number] Adm. Date:  [id number] Attending:  Thyra Breed CC:         Excell Seltzer. Annabell Howells, M.D.  Titus Dubin. Alwyn Ren, M.D. [facility]   History and Physical  NEW PATIENT EVALUATION:  Michael Novak is a 77 year old gentleman who is sent to [place] by Excell Seltzer. Annabell Howells, M.D. for evaluation of rectal and penile pain.  The patients chief complaint is rectal and penile pain.  The patient states he was in his usual state of health up until he had a health maintenance evaluation by Titus Dubin. Alwyn Ren, M.D. and was found to have a prostate nodule. He was seen by Excell Seltzer. Annabell Howells, M.D. and underwent biopsies which showed cancer of the prostate. He underwent seed implantations at [facility] in August of 2000. A CT scan showed that he had incomplete coverage and had redo seed placements in January of 2001. Since then, he stated that he has had progressively increased problems with pain in his rectal area and bladder. He was initially treated with Ultram and Percocet and eventually evolved to OxyContin with Percocet, Flomax, and Vioxx. He notes that he has pain which he describes as a sense of a hot poker being pushed up his penis and a hand grenade exploding in his rectum. It is made worse with urination or defecation. He has had a lot of difficulties with constipation and hard, bulky stools. He has been using stool bulkers but not laxatives for this. Intermittently, he will use milk of magnesia, and this will extrude a lot of his discomfort. He has been back to see Barbette Hair. Arlyce Dice, M.D., his GI specialist, who states he has some scarring from the radiation therapy. Excell Seltzer. Annabell Howells, M.D. has recystoscoped him recently and demonstrated what the patient described as scarring from the implants in addition. The patient states that bowel movements are such a painful experience that he dreads them. He has some mild weakness of  his legs. He denied numbness or tingling. He denied loss of control of his bowel or bladder, but he does have severe pain and a sense of urgency with both. He states that his discomfort is made better with normal bowel movements.  He does not recall being treated with Neurontin or with amitriptyline. He is currently taking about eight Percocet a day, in addition to 20 mg of OxyContin twice a day. He has not been treated with a laxative.  The patient presents with notes from Excell Seltzer. Annabell Howells, M.D.  CURRENT MEDICATIONS: 1. Norvasc 5 mg per day. 2. Zocor 20 mg per day. 3. Meclizine which he rarely takes. 4. Aspirin 81 mg per day. 5. Paxil 30 mg per day. 6. Vioxx 25 mg a day which he states is not very helpful. 7. OxyContin 20 mg twice a day. 8. Percocet 5/325 two tablets q.4-6h. 9. Vitamin supplements, as well as Metamucil.  ALLERGIES:  AUGMENTIN.  FAMILY HISTORY:  Positive for asthma, congestive heart failure, cancer, renal failure, and hypertension.  PAST SURGICAL HISTORY:  Significant for colonoscopy, laser surgery of the rectum through sigmoidoscopy, catheterization cystoscopies, right shoulder surgery, and inguinal hernia repairs x 4.  SOCIAL HISTORY:  The patient quit smoking 2 years ago. He does not drink alcohol. He retired from the [employer] in [place] and moved to [place] to be closer to his son.  ACTIVE MEDICAL PROBLEMS:  Coronary artery disease, history of MI in 1997, hypertension, peptic ulcer disease, osteoarthritis, history of Lyme disease diagnosed in 1989 associated with arthritis but no neurologic deficits, and history of vertigo which has resolved in recent years.  REVIEW OF SYSTEMS:  GENERAL:  Significant for night sweats, especially over the past 2 weeks, but he attributes this some of his medication changes. May be related to his opiates. HEAD:  Negative. EYES: Significant for corrective lens. NOSE, MOUTH, THROAT:  Significant for dry mouth. EARS:   Significant for decreased hearing and wax build up and recurrent sinusitis. PULMONARY: Negative. CARDIOVASCULAR:  He has a history of hypertension and coronary artery disease. See active medical problems. GI:  History of peptic ulcer disease. GU:  See HPI. MUSCULOSKELETAL:  History of osteoarthritis and Lyme disease. NEUROLOGICAL:  Negative. HEMATOLOGIC:  Negative. CUTANEOUS: Negative. ENDOCRINE:  Negative. PSYCHIATRIC:  Positive for depression related to his pain. ALLERGY/IMMUNOLOGIC:  Negative.  PHYSICAL EXAMINATION:  VITAL SIGNS:  Blood pressure is 114/64, heart rate is 86, respiratory rate is 20, O2 saturation is 96%, pain level is 7/10.  GENERAL:  This is a pleasant male who is in obvious discomfort from his pain.  HEENT:  Head was normocephalic, atraumatic. Eyes:  Extraocular movements intact with conjunctivae and sclerae clear. Nose:  Patent nares with large, bulbous nose. Oropharynx was free of lesions.  NECK:  Demonstrated good range of motion with carotids 2+ and symmetric without bruits.  LUNGS:  Clear.  HEART:  Regular rate and rhythm.  ABDOMEN:  Bowel sounds present with intact abdominal reflexes.  RECTAL:  Mildly decreased rectal sphincter tone with a large amount of hard stool in the rectal vault.  GENITALIA:  Not examined since he just had cystoscopy 2 days ago.  EXTREMITIES:  Radial pulses and dorsalis pedis pulses 2+ and symmetric. He exhibited no active synovitis at the present time.  NEUROLOGICAL:  The patient was oriented x 4. Cranial nerves 2 through 12 are grossly intact. Deep tendon reflexes were symmetric in the upper and lower extremities with hyperreflexia of 3+ at the knees and ankles with five beats of clonus on the right side, three beats on the left side with down ______ . Sensory exam showing attenuated pinprick to the upper calves and attenuated vibratory sense in the lower extremities. Upper extremities were intact.  IMPRESSION:  1.  Penile and rectal pain, status post radiation therapy with associated    scarring for prostate cancer. 2. Constipation exacerbating #1. 3. Multiple other medical problems per Titus Dubin. Alwyn Ren, M.D. which include    coronary artery disease, hypertension, peptic ulcer disease,    osteoarthritis, and remote history of Lyme disease in 1989. 4. Peripheral neuropathy, etiology unclear.  DISPOSITION: 1. I advised the patient that the first order of the day was to try and get    his bowels more regulated and that using Metamucil was probably    exacerbating his underlying problem that he needed to use a stool softener    and a laxative while he is on opiates. It seems clear that some of his    problems are related to the hardness of his stools and the infrequency of    stools followed by the use of milk of magnesia which apparently gives him a    harsh evacuation. I recommended that we start on Senokot S to see whether    we could get some better results with this. 2. He will continue on his current opiates  to see whether improving his stools    will allow him to get by with less of the rescue medication. If he is    requiring eight tablets per day of Percocet on top of his OxyContin 20 mg    twice a day, then we need to go ahead and put him up to 40 mg twice a day    since he is basically taking that amount. 3. I advised him to go ahead and stop the Vioxx, since I do not really think    it is doing a whole lot of help for him. 4. I wrote him prescriptions for OxyContin 20 mg tablets, #60; and Percocet    5/325, #200. 5. We reviewed a controlled substance contract with him. 6. I will see him back in follow-up in 3 to 4 weeks. At which time, we will    address how to manipulate some of his medications. He may benefit from a    trial of Neurontin and/or tricyclic antidepressants to try and reduce some    of his discomfort.  Additional exam:  The patient demonstrated an intact pinprick sense  over his perirectal area. DD:  02/21/01 TD:  02/21/01 Job: [id number]

## 2011-04-16 NOTE — H&P (Signed)
[facility]  Patient:    Michael Novak, Michael Novak                      MRN: [id number] Adm. Date:  [id number] Attending:  Londell Moh                         History and Physical  ADMITTING DIAGNOSES:  1. 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  2. Adenocarcinoma of the prostate.  3. Past history of coronary artery disease.  4. Possible pulmonary insufficiency.  HISTORY OF PRESENT ILLNESS:  This 77 year old male developed carcinoma of the prostate and was under the care of Dr. Bjorn Pippin. The patient underwent a radiation seed implantation and was found on the postoperative CT to have a technically poor quality implant. The decision was made for him to have additional seeds implanted and since that time has had severe problems with urinary urgency, frequency, bladder pain, and rectal pain. The patient eventually went into retention and transferred to my care. The patient underwent a cystoscopy and was found to have evidence of radiation cystitis as well as some degree of bladder outlet obstruction. The obstructing tissue was removed and the patient was able to urinate; however, he eventually required reinsertion of the Foley catheter due to the severity of his pain which was definitely worsened by voiding.  The patient was referred to [facility] for hyperbaric oxygen therapy and while at [facility], the decision was made to remove his catheter because it was thought he had developed a urinary tract infection. When the patients pain got worse, the Foley catheter was reinserted and it was inserted directly into his rectum. He now has a severe GI/GU fistula. The patient did not respond to hyperbaric oxygen and was told at [facility] that he would require a pelvic exenteration. Unfortunately, they were unwilling the operate on him for at least one month and the patient is in such extreme pain that he has requested this be done locally. I have had a  chance to review these findings very carefully with the patient, with his son and with his family and they would like to go ahead and be admitted for pain management and preparation for an eventual pelvic exenteration.  PAST MEDICAL HISTORY:  Remarkable for hernia repairs, Limes disease, as well as his seed implantation. The patient is known to have had a myocardial infarction in 1996 and known to have elevated cholesterol. He generally has minimal pulmonary problems but we do want a pulmonary function tests prior to this surgery. His principle problem right now is the fact that all of his urine is coming out through his rectum.  MEDICATIONS ON ADMISSION:  Multivitamins, Megace which has helped his ______ somewhat, iron, Zocor, Ditropan, Paxil, nortriptyline, Ultram and a Duragesic patch.  SOCIAL HISTORY:  The patient denies tobacco or alcohol.  FAMILY HISTORY:  Noncontributory.  REVIEW OF SYSTEMS:  As described above.  PHYSICAL EXAMINATION:  GENERAL:  He is a very thin, cachectic male in no acute distress.  HEENT:  Normocephalic, atraumatic. Cranial nerves 2-12 appear grossly intact.  NECK:  Supple with no adenopathy, or thyromegaly.  LUNGS:  Clear.  HEART:  Regular rate and rhythm with no murmurs, thrills, gallops, rubs or heaves.  ABDOMEN:  Soft.  RECTAL:  Consistent with a fistula.  GU:  Unremarkable. Testicles normal in size, shape and consistency.  EXTREMITIES:  There is no cyanosis, clubbing or edema.  IMPRESSION:  GI/GU fistula secondary to radiation injury.  PLAN:  Pelvic exenteration with preoperative pulmonary, general surgery and cardiology consults. The patient is placed on TPN to improve his nutritional status. DD:  06/30/01 TD:  07/03/01 Job: [id number]

## 2011-04-16 NOTE — Op Note (Signed)
[facility]  Patient:    Michael Novak, Michael Novak                      MRN: [id number] Proc. Date: 07/06/01 Adm. Date:  [id number] Disc. Date: [id number] Attending:  Londell Moh CC:         Billie Lade, M.D.  Excell Seltzer. Annabell Howells, M.D.  Meade Maw, M.D.  [facility] Critical Care Management Team  Anselm Pancoast. Zachery Dakins, M.D.   Operative Report  PREOPERATIVE DIAGNOSES: 1. Rectourethral fistula, status post radiation therapy. 2. Prostate cancer.  POSTOPERATIVE DIAGNOSES: 1. Rectourethral fistula, status post radiation therapy. 2. Prostate cancer.  PROCEDURE: 1. Pelvic exenteration with ileal conduit diversion and colostomy. 2. Cystoprostatectomy and ileal conduit diversion.  Dr. Logan Bores with    Dr. Brunilda Payor assisting. 3. Anterior peritoneal resection of sigmoid with colostomy, Dr. Zachery Dakins with    Dr. Logan Bores assisting. 4. Incidental appendectomy.  COMPLICATIONS:  None.  SPECIMENS:  Bladder, prostate, seminal vesicles, and sigmoid colon.  SECONDARY SPECIMEN:  Appendix.  DRAINS: 1. NG tube. 2. Internal jugular CVP line. 3. Two #2 flat Blake drains in the pelvis. 4. Two single-j catheters within the ileostomy. 5. Colostomy. 6. 24 French Ainsworth Foley catheter per urethra functioning as a pelvic    drain.  BRIEF HISTORY:  This 77 year old male had carcinoma of the prostate and underwent radiation seed implantation by Dr. Bjorn Pippin and Dr. Arnette Schaumann. The patient had inadequate coverage of the prostate on the postradiation CT scan and had additional radiation seeds implanted.  From a cancer-control standpoint, the patient has done very well but has developed problems with urinary retention as well as severe radiation cystitis and radiation proctitis.  Because of the pain and retention, the patient underwent cystoscopy and TUR to open up the prostate.  This allowed him to urinate, but the patient had such severe pain and such difficulty that he  preferred to keep the catheter in place.  The patient was referred to [facility] for hyperbaric oxygen therapy.  At [facility], the Foley catheter was removed.  When the patient was able to urinate, the catheter was reinserted, and traumatic development of a rectourethral fistula was noted.  The patient had a suprapubic tube placed. the hyperbaric oxygen was continued, and the patient remained quite miserable.  The patient was seen at [facility] by urology as well as by general surgery and was told that a pelvic exenteration with ileal conduit diversion and colostomy was the appropriate form of therapy.  They did tell him, however, that he could not have this done until the every end of August.  This was at the end of July.  The patient was in extreme pain.  He came to my office requesting that the operation be performed in [place].  After a lengthy discussion with the family and careful delineation of the pros and cons of intervention, the decision was made to bring the patient to the hospital for pain management and eventual pelvic exenteration.  The patient was started on TPN because of low albumin and was placed on a PCA pump to try and control his pain.  A preoperative assessment by cardiology was requested.  They felt that the patient needed to undergo a Cardiolite.  The Cardiolite showed possible ischemia on the right-hand side, near the area where he had had previous myocardial infarction.  Fortunately, the cardiac catheterization came back negative.  This did force the cancellation of his scheduled surgery on Tuesday.  The patient  had pulmonary function testing and was cleared for surgery by pulmonary.  The general surgeon saw him and agreed that this was the appropriate course of therapy.  Dr. Ailene Ards. Carolynne Edouard recommended that the patient have a Gastrografin enema to determine what the rest of his bowel looked like.  The Gastrografin enema revealed the presence of a fistula and because of the size of  the fistula, no contrast material could get into the more proximal portions of the bowel.  The patient was clear for surgery after the cardiac catheterization and is now ready to undergo the planned pelvic exenteration.  The patient had been transfused with two units of packed red blood cells on two separate occasions prior to the procedure to get his hematocrit up into the optimal range.  The patients other laboratory studies are important for the fact that he does have low serum albumin as well as the fact that he has mild elevation of his bleeding parameters for reasons that are not well known but may simply be nutritional in nature.  He is certainly not been on any form of blood thinners, and all aspirin has been discontinued. The patient is now as prepared as possible for his pelvic exenteration. Antibiotics have been given.  A full bowel prep has been performed including an antibiotic bowel prep.  He gave full and informed consent.  DESCRIPTION OF PROCEDURE:  After the successful induction of general anesthesia, the patient was placed in the low lithotomy position with PAS stockings in place and Allen stirrups utilized.  The patient had a suprapubic tube in place.  The suprapubic tube was cut and tracked out, and it was ellipsed out with the incision.  An incision was made beginning at the symphysis pubis, extending around the umbilicus, and to a point just above the umbilicus.  The skin around the midline suprapubic tube site was excised and removed.  The incision was carried down through Scarpas fascia until the rectus sheath was identified.  The rectus was opened in the midline.  The posterior peritoneum was opened, and the abdomen was entered.  The incision  was opened all the way down to the symphysis pubis.  The lateral lengths of peritoneum were taken down with the Ligasure.  The cord was identified on each side.  This was clamped with Kelly clamps and was tied off and  divided.  The retroperitoneum was exposed.  The Bookwalter retractor was placed.  The ureters were identified on each side.  They were dissected free from the surrounding tissue, and they were then freed all the way down to the bladder. On each side, they were doubly clipped and divided.  On each side, the iliac artery could be identified.  This was traced down until the bifurcation.  As the internal iliac was exposed, it was tied off at the takeoff of the superior vesicle artery.  It was not divided.  On each side, the superior vesicle artery was then appropriately tied with silk sutures and divided.  The posterior dissection behind the colon allowed for development of a plane back behind the sigmoid colon.  The sigmoid and bladder were then taken as a single specimen.  The pedicles on each side were taken with the Ligasure as well as with appropriate surgical ties, tying off all the branches of the internal iliac.  This dissection was taken all the way down to the endopelvic fascia which was opened.  In the area of the prostate, sharp dissection needed to be utilized,  as the area had become quite fibrotic.  Electrocautery was used to dissect that tissue off the underside of the pubic bone.  This came out nicely.  The prostate itself was noted to be markedly abscessed, and the material there was black and completely necrotic.  The entry into the colon could be identified.  The colon was transected within the sigmoid colon up above the bowel.  The entire specimen consisted of bowel, bladder, prostate, seminal vesicles, and the distal ureter was mobilized all the way down to the rectum.  Dr. Zachery Dakins then performed the abdominoperineal portion of the procedure and removed the rectum so that the entire specimen could be taken as a single specimen.  This was sent off for pathologic evaluation.  During the procedure, a Foley catheter was left in place through the abdominal wall opening, and  this was also sent with the specimen.  During the procedure, several large lymph nodes along the iliac chain were identified.  These were sent to have frozen section and were negative.  Dr. Zachery Dakins closed the rectal area, and he will dictate all of his portions of the procedure.  As he was closing, a careful inspection of the pelvis showed that there was no bleeding and that all of the portions of the pedicle had been adequately controlled.  A section of bowel was then harvested, and the bowel was put back together in the usual fashion with the GIA and a TA 55.  Prior to reanastomosis of the bowel, the tip of the bowel was resected additionally, as it looked a little dusky.  This was carefully inspected by Dr. Zachery Dakins and was felt to be adequate.  The conduit was then constructed in the usual fashion using the single-J stents.  The single-Js were passed out through small stab incisions at the bottom of the ileal conduit and passed up into the kidney on each side.  The stents were stitched in place with a 5-0 Vicryl. The anastomosis was performed on each side with a series of sutures of 4-0 Vicryl.  Prior to the formation of the ileal conduit, the staples had been removed from the bottom of the conduit.  This was then oversewn in two layers with 2-0 Vicryl.  The ileal conduit was then matured in the usual fashion with 3-0 Vicryls in an everting fashion.  The colostomy was also matured in an identical fashion.  This was done, however, after closure.  The colostomy and everything involved with the sigmoid colon will be dictated by Dr. Zachery Dakins. A careful inspection showed that the conduit appeared to be well situated. The ureters were in the retroperitoneal position.  The anastomotic traps had been carefully closed.  The bowel was not going to pinch off the conduit in any way.  The deep pelvis was carefully inspected. It was wide open with no signs of any residual tumor.  Adequate  hemostasis had been obtained, and the area was irrigated with antibiotic solution and then drained.  It should be noted, that an incidental appendectomy was performed during the procedure. This was done in the usual fashion by clamping across the bowel, tying it off with silk sutures, and then burying it with a series of pursestring sutures of Vicryl.  That was inspected at the end of the procedure and appeared to be normal.  The final inspection showed that the conduit and colostomy were appropriately positioned.  All mesenteric traps had been closed.  The conduit and the colostomy had been secured to  the underside of the peritoneum with no openings that could allow for hernia formation.  The patient had two #10 JP drains placed, one on each side.  These were sutured in place with 2-0 silk. The Ainsworth Foley catheter was placed into the urethra.  The entire area was irrigated and drained.  The patient was then closed in two layers.  The first retroperitoneal area consisted of 0 Vicryl.  The fascial layer consisted of #1 PDS.  The incision was irrigated and then closed with surgical clips.  The patients single-Js were brought out through the conduit, and the conduit bag was applied as was the colostomy bag.  The patient tolerated the procedure well and was taken to the recovery room in good condition.  He will have his H&H checked there.  He will be taken to the intensive care unit and may not be extubated immediately. DD:  07/06/01 TD:  07/07/01 Job: [id number]

## 2011-04-16 NOTE — Cardiovascular Report (Signed)
[place]. [facility]  Patient:    MENDEL, BINSFELD                      MRN: [id number] Proc. Date: 07/04/01 Adm. Date:  [id number] Attending:  Meade Maw A CC:         Jamison Neighbor, M.D.   Cardiac Catheterization  REFERRING PHYSICIAN:  Jamison Neighbor, M.D.  INDICATIONS FOR PROCEDURE:  Reversible ischemia in the inferoapical wall prior to planned surgical procedure.  DESCRIPTION OF PROCEDURE:  After obtaining written informed consent, the patient was brought to the cardiac catheterization lab in the postabsorptive state.  Preoperative sedation was achieved using IV Versed.  The right groin was prepped and draped in the usual sterile fashion.  Local anesthesia was achieved using 1% Xylocaine.  A 6 French hemostasis sheath was placed into the right femoral artery using the modified Seldinger technique.  Selective coronary angiography was performed using a JL5 and a JR4 Judkins catheter. Nonionic contrast was used and was hand injected.  All catheter exchange were made over a guide wire.  Single plane ventriculogram was performed in the RAO position using a 6 French pigtail curved catheter.  The patient was given 3000 units of intravenous heparin prior to the initiation of the procedure.  ACT aT the completion of the procedure was 150 and he was transferred to the holding area.  Hemostasis sheath was removed.  Hemostasis was achieved using digital pressure.  FINDINGS:  The aorta pressure is 132/70, LV pressure was 131/11.  Single plane ventriculogram revealed normal wall motion with an ejection fraction of 55%.  CORONARY ANGIOGRAPHY:  All arteries were large and ectatic.  Left main coronary artery bifurcated into the left anterior descending and circumflex vessel.  There was no significant disease in the left main coronary artery.  Left anterior descending:  The left anterior descending gave rise to a small diagonal #1, diagonal #2, diagonal #3 and  went on to end as the apical recurrent branch.  There was no significant disease in the left anterior descending.  The left anterior descending was ectatic.  The circumflex vessel was a large ectatic vessel as well, gave rise to a large OM-1 and went on to end as a large AV groove vessel.  There is a suggestion that there may be a total occlusion of a prior obtuse marginal as demonstrated by the stub.  Right coronary artery was a dominant vessel.  It was also ectatic in its proximal portion and had a 50% mid vessel stenosis.  Single plane ventriculogram revealed normal wall motion.  There was no mitral regurgitation noted.  The ejection fraction was 60%.  IMPRESSION: 1. Ectatic coronary arteries. 2. A 50% right coronary artery lesion previously noted. 3. Suggestion of a first obtuse marginal which was totally occluded. 4. Preserved left ventricular function.  RECOMMENDATION:  No further cardiac work-up as required.  Would restart his aspirin as soon as possible, as ectatic vessels are prone to thrombosis. Would again minimize fluid shifts with the anticipated surgery and attempt to keep his hematocrit more than 25.  I will discuss the patient further with you. DD:  07/04/01 TD:  07/04/01 Job: [id number]

## 2011-04-16 NOTE — Discharge Summary (Signed)
[facility]  Patient:    Michael Novak, Michael Novak Visit Number: [id number] MRN: [id number]          Service Type: SUR Location: 3W [id number] [id number] Attending Physician:  Michael Novak Dictated by:   Anselm Pancoast. Zachery Dakins, M.D. Admit Date:  07/25/2001 Discharge Date: 08/25/2001   CC:         Michael Novak, M.D.  Rockey Situ. Flavia Shipper., M.D.   Discharge Summary  DISCHARGE DIAGNOSES: 1. Postoperative infection of pelvis following a cystectomy and prostatectomy    with ileal loop and end colostomy. 2. Sepsis, fungal, probably secondary to pelvic abscess, possibly    subclavian peripherally inserted central catheter (PICC) line. 3. Partial small-bowel obstruction, resolved.  SURGERY:  No operative surgery, did have a PICC line placed.  CONSULTATIONS:  Infectious disease, Michael Novak.  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  The patient had been treated, developed necrosis of the prostate and bladder with a fistula into the rectum, an in spite of conservative management, had no improvement.  The patient was having significant pelvic pain in addition to the diarrhea, etc., and a cystectomy, prostatectomy, and removal of rectum was recommended by Michael Novak, and I assisted with the procedure performed with the proctectomy on August 8.  The patient had a slow recovery but no definite signs of infection.  He originally had significant drains, both Jackson-Pratts and, of course, he was draining the prostate area through the Foley that was in the balloon in the pelvic bed. The patient was discharged approximately two days prior to this readmission. The patient had been doing well for the few days prior to going home, and then shortly after going home, he started having nausea and vomiting, became bloated.  This persisted over the  next 24 hours.  Michael Novak office was called, and they recommended that he call me since this was an intestinal problem.  The patients wife called, and I suggested I see him in he emergency room, and I did.  HOSPITAL COURSE:  The x-rays and labs showed very dilated proximal small bowel consistent with small-bowel obstruction.  There was no gas actually in the colon even though there was a moderate amount of gas within the colostomy bag when the patient was seen in the emergency room.  The wounds appeared to be healing nicely.  I placed a nasogastric tube, and about 400 cc of bilious drainage was removed, and the patient felt better.  His initial lab study showed a white count of 14,200 with hemoglobin of 12.3. His electrolytes were normal.  Sodium 131, BUN 29, creatinine 1.1.  BUN when discharge had been 12.  It was felt that he was moderately dehydrated.  There were a few bowel sounds.  Both stomas appeared pink and viable and the stents in his ureters.  His current medications had been Megace and Tylox.  His pulse was 120.  He was afebrile.  He appeared chronically ill.  He was cooperative, and I readmitted him, gave him a fluid bolus, and then set his fluids at 175 cc an hour to replace the NG loss, and contacted Michael Novak that I thought that this was a postoperative problem and asked that he also see the patient in spite of his office thinking that it was a general surgical problem.  He saw the patient and was in agreement.  Our original opinion was that we thought he had a partial small-bowel obstruction.  Suction was performed, replaced an intravenous fluids.  Originally I did not place him on antibiotics since I could not obviously demonstrate an infection.  He was afebrile.  We did put him on Pepcid and repeated a flat and upright x-ray and obtained a urine culture.  He continued to be nauseated.  His BUN and creatinine improved.  He developed a low-grade fever, and the culture  of the initial urine grew out Klebsiella greater than 100,000 colonies.  Of course, this was from an ileal loop, and the blood cultures that were originally done did not confirm positive cultures.  Michael Novak recommended starting him on Cipro which we did for the urine, and this was started on August 29.  We obtained a CT of the abdomen and pelvis on that day that showed a probable small-bowel obstruction, NG tube in place, ureteral stents in satisfactory position, mild COPD, small dilatation of the aorta.  Changes thought to be related to the surgery in the pelvis. There were a few remaining radiation seeds in the pelvic area and obvious abscess noted.  The patient, of course, had now only been in about three days, but from his previous surgery, etc., we thought that hyperalimentation was definitely needed.  We obtained a PICC line.  He was beginning to have some gas pass per colostomy, and we thought that he was improving. Hyperalimentation was started; pharmacy managed this.  The patient showed some improvement.  Still mildly dilated small-bowel loops. There was still some bile within the nasogastric tube.  With the increased colostomy output then, we could see the contrast definitely within the colon. We were thinking that this hopefully is not a complete obstruction and were encouraged.  He then, and this was about a week after surgery, started having a low-grade temperature.  Now, his bowels were beginning to work definitely better, and we had removed the nasogastric fever.  But then he spiked a fever of 103 on September 6 and was recultured.  His white count at this time did become abnormal.  We switched the Cipro to Primaxin and repeated the CT of the abdomen and pelvis.  The CT was completed in the early a.m. of September 7, and it showed now a fluid collection basically where the prostate used to be, and a Foley was placed through the penis, and about 4 ounces of white purulent,  foul-smelling material was  removed, and I irrigated this with saline and removed the Foley.  There was no communication from this to the little area of the rectal and skin incision that was still open, and I placed him on vancomycin because of the fever and thought that we possibly had a staph resistant organism.  Michael Novak saw him and thought that we could get better irrigation and drainage if we would leave the Foley in place, and he placed a another larger Foley within the prostate area and placed it to drainage.  The cultures that had been obtained and also the pelvic drainage showed Candida.  I asked for an infectious disease consult by Dr. Roxan Novak.  We removed the subclavian.  I am not sure if it was an infected catheter.  I expect the source was the pelvis and not the catheter.  Michael Novak recommended that we add Diflucan which was done.  The patient, for several days, had significant fevers and chills.  He had been transfused a couple of  units, not that we had any active bleeding, and he was in intensive care unit.  Then, after about two days of the very broad antibiotic coverage, he obviously was not having as much temperature elevation.  He appeared to be improving. Inspection of the pelvis, although he has always had significant pelvic pain, could not find a fluctuant abscess.  There was some question of whether or not we would have to do a repeat laparotomy, but with the patient improving, his temperature subsiding, and the pain decreasing, we felt that continuing the antibiotics was the wiser option instead of trying to do a pelvic exploration with some recent major surgery and the colostomy and the ileal loop, and continued the antibiotic coverage.  He was not having an elevated white count.  He slowly improved.  His appetite was improving, temperature subsiding.  Over the next about one week, he just kind of slowly improved, and the drainage through the catheter  placed through the penis into the old area where the bladder, prostate, and abscess used to be controlled things.  He was then transferred out of the unit, continued on the vancomycin for a total of two weeks, and his wounds, his pain, his everything was all improving, and consideration was being made about possibly discharging him.  The patient had a repeat CT which showed that the little fluid collection where the prostate used to be had resolved.  The Foley was still in place.  A new PICC line had been placed about 48 hours after we removed the subclavian catheter when he was obviously septic and receiving the hyperalimentation.  We first considered removing the Foley before he went home, but then Michael Novak thought that it would probably be best to leave that in and remove it in the office.  I basically removed the PICC line and made sure that he was afebrile for two days prior to letting him go home.  He is still on oral antibiotics.  DISCHARGE MEDICATIONS: 1. Cipro 500 mg 2 times a day. 2. Flagyl 500 mg 2 times a day. 3. Tylox for pain. 4. He continues on his Megace.  DISPOSITION:  He was discharged on September 26 in improved condition.  I think this was a partial bowel obstruction, but probably this was more related to a kind of smoldering pelvic infection from his previous surgery than that of a true small-bowel obstruction.  He is eating and having good colostomy and urine output at this time.  FOLLOWUP:  He will be seen in Michael Novak office in approximately five days at which time we think the Foley will be removed.  He will follow up with me in approximately two to three weeks.  He is aware that if his nausea, vomiting, fever, or has greatly increasing pain to call either my office or Michael Novak office. Dictated by:   Anselm Pancoast. Zachery Dakins, M.D. Attending Physician:  Michael Novak DD:  09/14/01 TD:  09/14/01 Job: [id number]

## 2011-04-16 NOTE — H&P (Signed)
[facility]  Patient:    Michael Novak, Michael Novak                      MRN: [id number] Adm. Date:  [id number] Attending:  Thyra Breed CC:         Excell Seltzer. Annabell Howells, M.D.  Titus Dubin. Alwyn Ren, M.D. [facility]   History and Physical  FOLLOW-UP:  [name] called to day to let me know that the lidocaine ointment is helping for about three or four hours.  He is applying it every eight hours. He developed sweats and does not feel that the Percocet is quite doing what it should be, so I advised him that I would probably switch him on over to Dilaudid, and I suspect the sweats are coming from the OxyContin, which we will have to consider taking him off of if this continues.  I advised him I would like to go ahead and add in Pamelor in a low dose.  He may ultimately need to see Dr. Alwyn Ren with regard to his high level of anxiety.  He has called me on Good Friday, the Saturday following, and then again today because of his pain, and a lot of his concerns are the anxiety associated with this. He is not interested in seeing a psychiatrist right now or a psychologist.  I will go ahead and prescribe Dilaudid 4 mg one to two p.o. q.4h. p.r.n. pain in place of the Percocet, #200, and start him on Pamelor 10 mg one p.o. q.p.m. He is already taking OxyContin 20 mg three times a day at my request.  He did get disimpacted over the weekend, but apparently he did not get up with Dr. Arlyce Dice, and Dr. Virginia Rochester, Dr. Nita Sells associate, did not offer any assistance to the patient with regard to his impaction, so he is very frustrated from that perspective.  He was able to get himself more or less freed up using a Fleets enema with the subsequent use of the lidocaine.  He is scheduled to see me back in the very near future.  He will likely need to go ahead and see Dr. Alwyn Ren for his anxiety. DD:  02/27/01 TD:  02/27/01 Job: [id number]

## 2011-04-16 NOTE — Op Note (Signed)
[facility]  Patient:    Michael, Novak                      MRN: [id number] Proc. Date: 03/29/01 Adm. Date:  [id number] Attending:  Evlyn Clines CC:         Excell Seltzer. Annabell Howells, M.D.   Operative Report  PREOPERATIVE DIAGNOSIS:  Clot urinary retention, status post seed implantation for carcinoma of the prostate.  POSTOPERATIVE DIAGNOSIS:  Clot urinary retention, status post seed implantation for carcinoma of the prostate.  OPERATION PERFORMED:  Cystoscopy, clot evacuation and transurethral resection of the prostate.  SURGEON:  Jamison Neighbor, M.D.  ANESTHESIA:  General.  COMPLICATIONS:  None.  DRAINS:  A 24 French 3-way Foley catheter.  INDICATIONS FOR PROCEDURE:  This 77 year old male has a somewhat complicated urologic history.  The patient was found to have carcinoma of the prostate in April 2000.  The patient underwent external beam radiation therapy and seed implantation performed by Dr. Bjorn Pippin in August of 2001.  The patient had numerous cold spots on his postimplant CT.  Dr. Roselind Messier recommended additional seeds be placed anteriorly.  This was performed by Dr. Annabell Howells in January of 2002.  The patient has had problems with radiation cystitis and radiation proctitis. He is known to have a rectal ulcer.  He was scheduled to go to [facility] for hyperbaric oxygen therapy.  The patient developed problems with retention on or around April 14 and had a Foley catheter placed in the emergency room.  At first his urine ran quite clear and he was very comfortable with the catheter in place.  The patient developed some clot-like material passing around the urethra and the Foley catheter was removed on March 28, 2001.  The patient did not urinate for almost 12 hours and presented to the emergency room on March 28, 2001.  A Foley catheter was inserted.  Grossly bloody urine was obtained, 900 cc was drained.  The patient still was not relieved.  The  catheter was clotted, so it was removed and a second Foley catheter was inserted.  400 cc was obtained.  He still had clots and was not empty.  Attempt to place an Ainsworth catheter was unsuccessful.  Urologic consultation was sought.  The Ainsworth catheter was eventually inserted using a ____________ guide and it was clear that the patient had a very rigid, fixed prostate following the radiation.  This did give him some relief but it clotted off as well.  The decision was made to proceed with clot evacuation and possible TURP.  The patient was told that he would have to wait until he was n.p.o. for an adequate length of time.  He is now to under a cystoscopy, clot evacuation and TURP.  These findings have been discussed with the patient, his wife and his son.  They understand that he might have problems with injury to the bladder or rectum because of the fact that he is postradiation.  He also understands that there is a very real risk for incontinence.  He also knows there may be need for additional therapy to try and stop the rectal bleeding and the bleeding per urethra.  The patient gave full and informed consent.  DESCRIPTION OF PROCEDURE:  After successful induction of general anesthesia, the patient was placed in the dorsal lithotomy position and prepped with Betadine and draped in the usual sterile fashion.  The urethra was calibrated to 30 [place] with Zenaida Niece  Buren sounds.  The Olympus continuous flow resectoscope sheath was then inserted using a Timberlake obturator.  The Stern-McCarthy resectoscope was inserted.  It was clear upon initial inspection that the entire bladder was filled with clot.  The Toomey syringe was used then used to evacuate out the clot until all clot had been removed.  The resectoscope was reinserted.  The bladder was carefully inspected.  No tumors or stones were seen.  It was difficult to completely visualize the bladder because of the fixed and fibrotic  nature of the prostate and bladder neck.  The bleeding was seen to be coming primarily from the prostatic urethra.  The bladder neck was extremely tight and this would explain why the patient had trouble urinating. The [employer] was used to split the bladder neck and then additional resection was performed.  Great care was taken to avoid excessive resection posteriorly because of the patients known rectal injury.  The tissue was felt to be very hard and fibrotic and certainly did not cut well.  Care was taken to avoid removing excessive tissue for fear of exposing a lot of seeds.  Only one seed was actually exposed. The radiation oncologists were contacted.  They will use a Geiger counter to check all of the specimen as well as the drapes, etc. to make sure that there is no activity.  It was felt that the seeds were ____________  but if one was cut it was possible that additional radiation might leak out.  At the end of the procedure, the prostate fossa was wide open.  Great care was taken to avoid excessive resection out towards the veru and the resection in that area was quite minimal.  It was known that this area was very fibrotic and certainly it was feared that the patient might have problems with postoperative stress incontinence.  There was no evidence of any injury to the rectum or to the bladder.  The ureteral orifices were not injured.  The bladder neck was not undermined.  The prostatic fossa did appear to be wide open.  All chips were irrigated from the bladder.  Final inspection showed no additional bleeding spots and there was good coagulation of everything within the prostatic fossa.  The resectoscope was removed and a Foley catheter was inserted.  This went very easily into the bladder and no longer was any restriction to passage of the catheter.  This irrigated freely. Continuous bladder irrigation was started with normal saline.  The patient tolerated the procedure well  and was taken to recovery room in good condition. He will continue with continuous bladder irrigation ____________ at 24 hours  and may be ready for a voiding trial. DD:  03/29/01 TD:  03/29/01 Job: [id number]

## 2011-04-16 NOTE — Discharge Summary (Signed)
[facility]  Patient:    Michael Novak, Michael Novak                      MRN: [id number] Adm. Date:  [id number] Disc. Date: [id number] Attending:  Londell Moh                           Discharge Summary  ADMITTING DIAGNOSES: 1. Clot urinary retention. 2. Radiation cystitis. 3. Radiation proctitis. 4. Past history of carcinoma of the prostate. 5. History of coronary artery disease.  PROCEDURE:  Cystoscopy with clot evacuation and transurethral resection of prostate on Mar 29, 2001.  HISTORY OF PRESENT ILLNESS:  This 77 year old male was found to have carcinoma of the prostate in April 2000.  The patient was treated with seed implantation and underwent this implant by Dr. Bjorn Pippin in August 2000.  Followup CT scan showed what was felt to be an incomplete implant with poor placement anterior, so the patient had additional seeds placed in January 2001.  The patients PSA has apparently dropped into normal level, but unfortunately, the patient has had a lot of problems with hematuria and what has been felt to be radiation cystitis and radiation proctitis.  He apparently underwent cystoscopy for evaluation of bleeding and has had what he describes as a laser fulguration. The patient developed problems with urinary retention and on May 14, had a Foley catheter inserted.  The patient subsequently had his catheter removed on the day of admission.  He could not urinate and developed problems with grossly bloody urine.  The patient presented to the emergency room where initial Foley catheter was placed and 900 cc of grossly bloody urine was drained.  Catheter clotted off and it was removed.  A second catheter was inserted and an additional 400 cc came out before it clotted off.  Attempts at placing and anterior Foley catheter were unsuccessful.  Urologic complications prompted admission and management of the patients problem.  PAST MEDICAL HISTORY: 1. Coronary  artery disease. 2. Small heart attack in 1997. 3. Multiple inguinal hernia repairs, but no other surgery.  The patients family history, social history and review of systems is noncontributory.  One thing that is important to note is that the patients pain is severe and he has been seen in the pain clinic by Dr. Thyra Breed.  MEDICATIONS:  1. Zocor.  2. Multivitamins.  3. Vitamin E and C.  4. Aspirin.  5. Norvasc.  6. OxyContin.  7. Nortriptyline.  8. Paxil.  9. Xylocaine. 10. Senokot. 11. Azo-Standard.  HOSPITAL COURSE:  The patient was taken to the operating room where he underwent cystoscopy and clot evacuation.  The patient had prostatic tissue trimmed in order to help him to urinate.  He did feel much more comfortable with the Foley catheter in place.  The patient had an unremarkable postoperative course.  His urine remained clear.  He had a Foley catheter removed.  On Mar 30, 2001, he failed that voiding trial.  The decision was made to send him home with the Foley catheter as he felt significantly more comfortable with the urine drain.  The patient was sent home with pain medication, antibiotics, Pyridium and B&O suppositories.  We do think the patient will likely require a hypobaric oxygen treatment at [facility] in order to try and work on his radiation proctitis.  More importantly, however, something will need to be done about his long-term urinary drainage  as the patient really has a difficult time voiding with severe pain. DD:  04/06/01 TD:  04/07/01 Job: [id number]

## 2011-04-16 NOTE — Discharge Summary (Signed)
NAME:  Michael Novak, Michael Novak NO.:  [id number]   MEDICAL RECORD NO.:  [id number]          PATIENT TYPE:  INP   LOCATION:  1402                         FACILITY:  [facility]   PHYSICIAN:  Vikki Ports, MDDATE OF BIRTH:  04/08/1930   DATE OF ADMISSION:  06/04/2005  DATE OF DISCHARGE:  06/09/2005                                 DISCHARGE SUMMARY   ADMISSION DIAGNOSIS:  Small bowel obstruction.   DISCHARGE DIAGNOSIS:  Small bowel obstruction.   CONDITION ON DISCHARGE:  Good and improved.   FOLLOW UP:  With me on p.r.n. basis.   HISTORY OF PRESENT ILLNESS:  The patient is a very pleasant 77 year old  white male who presented to the emergency room with bowel obstruction.  He  was admitted for IV hydration and clinical followup, NG tube decompression.   HOSPITAL COURSE:  The patient was admitted, had NG tube placed and IV  fluids.  By hospital day 1, the patient was doing very well, was still not  having much colostomy output but he did respond to a Fleet enema at his  ostomy.  Repeat acute abdominal series showed no evidence of small bowel  obstruction.  He was advanced to a clear liquid diet, then a regular diet  which he was tolerating well.  He is discharged to home with resolution of  his small bowel obstruction and given instructions to follow up with me  p.r.n.       KRH/MEDQ  D:  06/09/2005  T:  06/09/2005  Job:  [id number]

## 2011-04-16 NOTE — H&P (Signed)
NAME:  Michael Novak, Michael Novak NO.:  [id number]   MEDICAL RECORD NO.:  [id number]          PATIENT TYPE:  INP   LOCATION:  0104                         FACILITY:  [facility]   PHYSICIAN:  Vikki Ports, MDDATE OF BIRTH:  13-Nov-1930   DATE OF ADMISSION:  06/04/2005  DATE OF DISCHARGE:                                HISTORY & PHYSICAL   CHIEF COMPLAINT:  Nausea, vomiting, abdominal pain.   HISTORY OF PRESENT ILLNESS:  The patient is a 77 year old white male who is  now 4 years status post pelvic exoneration with ileal conduit for urinary  diversion and end colostomy by Dr. Zachery Dakins and Dr. Logan Bores. The patient  presents to the emergency room with a 2-day history of crampy abdominal  pain, low colostomy output, nausea, and two episodes of emesis.   PAST MEDICAL HISTORY:  None.   PAST SURGICAL HISTORY:  As above.   MEDICATIONS:  None.   PHYSICAL EXAMINATION:  VITAL SIGNS:  His heart rate is 99, temperature is  98.6, blood pressure is 140/72.  GENERAL:  The patient looks apparently in no distress.  HEENT:  Benign, normocephalic, atraumatic. Pupils are equal, round, reactive  to light.  NECK:  Supple and soft without thyromegaly or cervical adenopathy.  LUNGS:  Clear to auscultation and percussion x2.  HEART:  Regular rate and rhythm without murmurs, rubs, or gallops.  ABDOMEN:  Distended though nontender, with a colostomy in the left lower  quadrant and an ileal conduit in the right lower quadrant. No evidence of  colostomy output. There is urine in the ileal conduit bag. There is a  ventral hernia in the midline as well.  EXTREMITIES:  Shows normal muscular tone.   REVIEW OF SYSTEMS:  Significant for emesis and nausea and low colostomy  output. Otherwise, negative for constitutional, respiratory, cardiac,  neurologic symptomatology.   Acute abdominal series is consistent with a partial small-bowel obstruction.   IMPRESSION:  Partial small-bowel obstruction.   PLAN:  NG tube decompression and clinical follow-up. The patient has had  significant pelvic radiation and surgery would be quite difficult and I am  hoping we can manage him nonoperatively with resolution of his partial small-  bowel obstruction.       KRH/MEDQ  D:  06/04/2005  T:  06/04/2005  Job:  [id number]

## 2011-04-16 NOTE — H&P (Signed)
[facility]  Patient:    Michael Novak, Michael Novak                      MRN: [id number] Adm. Date:  [id number] Attending:  Thyra Breed CC:         Billie Lade, M.D.  Excell Seltzer. Annabell Howells, M.D.  Barbette Hair. Arlyce Dice, M.D. [facility]  Titus Dubin. Alwyn Ren, M.D. [facility]   History and Physical  FOLLOWUP EVALUATION:  Tayari comes in for followup evaluation of his rectal pain and penile pain.  Since his last evaluation, we have had to place him on lidocaine topically, which he is using on an average of two times a day, occasionally three times a day, and Pyridium.  The Pyridium was helping but unfortunately, he developed urinary retention, which is probably reflective of the opiates he was taking, as he continued on the Percocet with the Dilaudid, on top of the OxyContin.  He had to go into the hospital on Saturday and have a catheter placed; this has significantly reduced his penile pain and he has none today.  He continues to have rectal pain and has had some blood per rectum.  He plans to see Dr. Barbette Hair. Arlyce Dice tomorrow.  Dr. Billie Lade, his radiation oncologist, has recommended that he go to [facility] for hyperbaric medicine treatments and he is in the process of setting this up.  The patient does not note a great deal of improvement with the opiates at the present time, nor from the nortriptyline he is taking.  He cannot sit.  In general, the patient seems to have a much better disposition than when I spoke to him last on the phone a couple of weeks ago and he does seem to be significantly improved in my eyes, although he is still very anxious over his current situation.  He rates his pain at 7/10.  His pain in his rectal area is brought on by sitting on the area or bowel movements.  PHYSICAL EXAMINATION:  VITAL SIGNS:  Blood pressure 128/73.  Heart rate is 84.  Respiratory rate is 12.  O2 saturation is 91%.  Pain level is 7/10.  GENERAL:  His general exam is little changed  from his last visit.  IMPRESSION: 1. Rectal pain with rectal ulcer with recent bleeding.  He is currently using    topical lidocaine p.r.n. two to three times per day.  He was advised of the    potential risks of this. 2. Constipation secondary to opiates. 3. Urinary retention which I suspect is partially related to his opiates. 4. 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 5. Peripheral neuropathy. 6. Other medical problems per Dr. Titus Dubin. Hopper.  DISPOSITION: 1. Continue with the topical lidocaine but use only twice a day.  He was given    some samples of this. 2. Stop the OxyContin and the Dilaudid and use Duragesic patch 50 mcg    1 applied every 3 days, #10.  The nurses have reviewed this with him. 3. He will continue with Percocet 5/325 mg for breakthrough pain, 1 to 2 p.o.    q.6h., #200 with no refill. 4. Continue with Senokot. 5. Stop Pyridium since he has got a catheter in place. 6. Continue other medications as previously. 7. Follow up with me in four weeks.  In the meantime, he is encouraged to    follow up closely with Dr. Arlyce Dice with regard to his rectal bleeding and    with  Dr. Excell Seltzer. Wrenn with regard to his bladder problem. DD:  03/15/01 TD:  03/16/01 Job: [id number]

## 2011-04-16 NOTE — H&P (Signed)
[facility]  Patient:    Michael Novak, Michael Novak                      MRN: [id number] Adm. Date:  [id number] Attending:  Benny Lennert CC:         Excell Seltzer. Annabell Howells, M.D.   History and Physical  ADMISSION DIAGNOSIS:  Carcinoma of the prostate with clot and urinary retention.  HISTORY OF PRESENT ILLNESS:  This 77 year old male was found to have carcinoma of the prostate in April 2000.  The patient was given several options for therapy including radium seed implantation, external beam radiation therapy, and radical prostatectomy.  The patient elected to undergo radium seed implantation and underwent his first implant in August 2000.  Followup CT scan apparently showed the patient had a very incomplete implant, so he had additional seeds placed in January 2001.  According to the family, the patients PSA dropped to 1.8 but they do not know of any lower values, so it is unknown to me whether the patient had a good clinical response, insofar as cancer control.  It does appear, however, that the patient has had a lot of problems with hematuria and what sounds like radiation cystitis and possible radiation proctitis.  We note that he underwent some form of a cystoscopy with laser fulguration, which may have been for bleeding from the procedure.  The patient presented to the emergency room on April 14 in urinary retention. He had a Foley catheter placed.  The urine at that time ran clear.  He actually felt better with the Foley catheter in place as his urgency, frequency, and spasms disappeared.  The urine flowed freely.  The patient did develop a little bit of discharge around the meatus and for that reason requested the Foley catheter be removed.  The Foley was removed earlier today in Dr. Aaron Edelman office.  The patient was given antibiotics after removal of the catheter.  He was unable to urinate whatsoever and presented to the emergency room this evening.  The  emergency room staff initially put a Foley catheter and got 900 cc of what appeared to be grossly bloody urine.  That catheter clotted off and could not be irrigated.  It was removed.  A second catheter, a 22-French coude, was placed and an additional 400 cc came out before it clotted off.  They attempted to place an ______ Foley catheter without success.  The patient is to be admitted for clot evacuation and further therapy by Dr. Annabell Howells.  PAST MEDICAL HISTORY:  Otherwise remarkable for coronary artery disease.  He had what his family describes as a small heart attack in 1997.  His only other surgery, aside from the urologic procedures described above, are four different inguinal hernia repairs, the last one back in 1974.  FAMILY HISTORY:  The patients father died of old age.  His mother died of old age.  He has one sister with an abdominal cancer of unknown primary and a sister who died of a myocardial infarction.  A brother died of lung cancer, apparently from secondhand smoke, since he was a nonsmoker.  REVIEW OF SYSTEMS:  The patient describes constipation secondary to pain medication.  He has been on a significant number of pain medications since developing his urologic problem.  He has had severe pain and has been seen in the pain clinic by Dr. Thyra Breed.  ALLERGIES:  AUGMENTIN.  MEDICATIONS:  1. Zocor 20 mg daily.  2.  Multivitamin 1 daily.  3. Vitamin E.  4. Vitamin C.  5. Aspirin 1 daily.  6. Norvasc 5 mg daily.  7. OxyContin 20 mg b.i.d.  He previously tried Dilaudid.  9. Nortriptyline 10 mg q.h.s. 10. Flomax 0.4 mg daily. 11. Paxil 30 mg q.a.m. (20 mg tablets 1-1/2 tablets). 12. Xylocaine to the penis. 13. Senokot on a p.r.n. basis. 14. AZO-Standard for burning.  PHYSICAL EXAMINATION:  VITAL SIGNS:  Temperature 97.7, pulse 81, respirations 20, blood pressure 131/78.  GENERAL:  The patient is a pale, diaphoretic, ill-appearing male in significant pain from  his bladder and penis.  HEENT:  Normocephalic, atraumatic.  Cranial nerves II-XII appear grossly intact.  NECK:  Supple.  No adenopathy or thyromegaly.  LUNGS:  Clear.  HEART:  Regular rate and rhythm.  No murmurs, thrills, gallops, rubs, or heaves.  ABDOMEN:  The abdomen was very tender.  The bladder does appear still to be slightly distended but he is very rigid from trying to pass the clots.  GENITOURINARY:  Normal testicles bilaterally.  Penis is unremarkable.  RECTAL:  Exam is not performed secondary to the patients pain.  EXTREMITIES:  No cyanosis, clubbing, or edema.  NEUROLOGIC:  Appears grossly intact.  IMPRESSION:  Clot retention, carcinoma of the prostate, radiation cystitis implanted.  The patient admitted for pain management and will require clot evacuation once he has been n.p.o. for an adequate length of time. DD:  03/29/01 TD:  03/29/01 Job: [id number]

## 2011-04-16 NOTE — H&P (Signed)
[facility]  Patient:    Michael Novak, Michael Novak Visit Number: [id number] MRN: [id number]          Service Type: SUR Location: 4W [id number] [id number] Attending Physician:  Henrene Dodge Dictated by:   Anselm Pancoast. Zachery Dakins, M.D. Adm. Date:  07/25/2001   CC:         Jamison Neighbor, M.D.   History and Physical  CHIEF COMPLAINT:  Nausea and vomiting and abdominal pain.  HISTORY OF PRESENT ILLNESS:  Michael Novak is a 77 year old Caucasian male who was recently hospitalized by Dr. Logan Bores with complications from radiation seed implants for carcinoma of the prostate.  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  He then underwent a cystectomy, prostatectomy, and removal of his rectum by Dr. Logan Bores. I assisted and performed a proctectomy on July 06, 2001.  The patient had sort of a slow recovery, but had no problems with infection.  His ileal loop and also his colostomy were working satisfactory, and he was discharged home on Thursday of last week.  The patient had three days in which he said he was doing well and then after lunch on Sunday started having nausea and vomiting, bloated, and vomited Sunday evening.  He has had persistent nausea and vomiting over the next 24 hours and this morning he called Dr. Logan Bores office who recommended he call me. On discussion with the patients wife, it appears really that he has not been able to keep any fluids down yesterday, and I recommended he come to the emergency room.  He did and lab studies were obtained and x-rays were obtained.  The x-rays show a very dilated proximal small intestine consistent with a small-bowel obstruction.  There is no actual gas that I seen in the colon, even though the colostomy bag is very distended with air.  The patient of course has an ileal loop in the right lower  quadrant and an end-colostomy sigmoid on left.  His wounds appear to be healing nicely.  An NG tube was placed and there was about 400 cc of bilious drainage with some relief of the patient.  His labs studies show a white count of 14,200, hemoglobin 12.3.  His electrolytes were normal; _____ slightly low.  Chloride 95, glucose 139, BUN 29, and creatinine 1.1.  His BUN when he was discharged I think was approximately 12 and with the pulse elevated, I think he is slightly dehydrated.  The patients abdominal exam after NG tube was placed was vaguely tender and a few bowel sounds.  Both stomas appear viable.  There is a little bloody urine where his urine at the time of discharge was clear.  He still has stents in place.  There is a lot of gas in the colostomy bag, but not much stool as noted above.  The patient really does not act like he has an intra-abdominal infection, but mechanical small-bowel obstruction, and he has received some fluids with a small bolus.  I will give him a second bolus and he needs to be readmitted.  I think that we will recheck later today on whether he needs antibiotics.  I do want to get a urine culture and Dr. Logan Bores will be notified of his readmission.  I am looking at the preliminary films, and it looks like that obstruction is probably about two-thirds of the way  down from the small-bowel, and this is of course where the ileal loop is.  CURRENT MEDICATIONS:  Megace and pain medications.  I am not sure what he was taking at home.  I think it was Tylox.  PAST MEDICAL HISTORY:  Please refer to recent admission and hospitalization.  PHYSICAL EXAMINATION:  VITAL SIGNS:  Pulse 120, temperature 97.1, blood pressure 137/91, and respirations 24.  GENERAL:  The patient is a chronically ill male who is cooperative.  HEENT:  Appears not extremely dehydrated.  LUNGS:  Good breath sounds bilaterally.  HEART:  Sinus tachycardia.  ABDOMEN:  Lower midline  incision healing without infection.  End-colostomy and ileal loop both appear viable.  Stents coming out of ileal loop and the urine is a little bloody.  RECTUM:  Closed and no evidence of any infection here.  He does have a little skin excoriation from a fungal infection.  PLAN:  The patients NG tube has been placed.  We will keep him on NG suction. Replace his intravenous fluids and repeat electrolytes in a.m. and obtain urine culture.  Hopefully this will be managed with conservative NG suctioning, but the patient is aware that if he does develop a small-bowel obstruction and it does not promptly respond, he may need a repeat laparotomy. Dictated by:   Anselm Pancoast. Zachery Dakins, M.D. Attending Physician:  Henrene Dodge DD:  07/25/01 TD:  07/25/01 Job: [id number]

## 2011-04-16 NOTE — Consult Note (Signed)
[facility]  Patient:    Michael Novak, Michael Novak                      MRN: [id number] Adm. Date:  [id number] Attending:  Londell Moh                          Consultation Report  REFERRING PHYSICIAN:  Dr. Jamison Neighbor.  INDICATION FOR REFERRAL:  Preop evaluation prior to abdominal laparotomy and repair of enteral fistula.  HISTORY:  Michael Novak is a very pleasant 77 year old gentleman with past medical history of coronary artery disease, status post myocardial infarction in 1997; details of this event are not available.  Michael Novak states that he had a heart catheterization performed at [facility] in 1997; I am unable to locate these records.  He states that no intervention was performed.  His myocardial infarction presented as a sudden onset of chest pain while driving. He has had no further episodes of chest pain.  He has had a relatively sedentary lifestyle in the past six months secondary to co-morbid condition. His most strenuous activities have been that of assisting his wife with grocery shopping.  He has discontinued all tobacco products for approximately two years.  He has had no further cardiac evaluation since his previous MI. Michael Novak notes an episode of shortness of breath in the past week while out in extreme heat.  He notes, "I do fine while under the air conditioner."  The shortness of breath was associated with pedal edema but no chest pain or nausea or vomiting.  PAST MEDICAL HISTORY:  1. Coronary artery disease, myocardial infarction as noted above.  2. Remote tobacco use.  3. Prostate cancer.  4. Dyslipidemia.  5. Poor nutritional status.  6. Anemia.  7. Depression.  PAST SURGICAL HISTORY:  1. Colonoscopy.  2. Laser surgery of the rectum.  3. Right shoulder repair.  4. Inguinal hernia repair.  MEDICATIONS AT TIME OF ADMISSION:  1. Aspirin 81 mg daily.  2. Paxil 30 mg p.o. q.d.  3. Vioxx 25 mg p.r.n.  4.  OxyContin 20 mg p.o. b.i.d.  5. Percocet 5/325 mg q.4-6h.  6. Vitamin supplements.  CURRENT MEDICATIONS:  1. Erythromycin, Neomycin and GoLYTELY for bowel prep.  2. Morphine sulfate.  3. Central TNA.  4. Cefotetan.  5. Compazine.  6. Reglan.  7. Zofran.  8. Benadryl.  9. Tylenol p.r.n. 10. Ambien.  His aspirin has been held.  FAMILY HISTORY:  Significant for asthma, congestive heart failure, cancer, renal failure and hypertension.  SOCIAL HISTORY:  Stopped smoking two years ago, as noted above, while receiving encouragement from his wife.  He is retired from the [employer] and moved to [place] eight years prior to live with his son.  REVIEW OF SYSTEMS:  Significant for ongoing night sweats, constipation, hematuria, as noted above.  He had been living independently at home.  He has chronic back pain, wears glasses, alternating constipation and diarrhea. Appetite had been poor until the initiation of the Megace.  PHYSICAL EXAMINATION:  GENERAL:  Physical exam reveals an elderly, cachectic-appearing male in no acute distress.  VITAL SIGNS:  His systolic blood pressure is ranging 110-130 over a diastolic of 70.  Heart rate is ranging 70s to 80 with an O2 saturation of 96%.  His T-max has been 99.9.  HEENT:  Unremarkable.  NECK:  Good carotid upstrokes.  No carotid bruit.  Thyroid is  not palpable.  PULMONARY:  Exam reveals breath sounds which are equal and clear to auscultation.  CARDIOVASCULAR:  Exam reveals a regular rate and rhythm, normal S1, normal S2. No rubs, murmurs or gallops noted.  His PMI is within normal limits.  ABDOMEN:  Soft and benign.  No unusual bruits or pulsations are noted. Hyperactive bowel sounds are noted.  EXTREMITIES:  No clubbing, cyanosis, or edema.  Distal pulses are equal and palpable.  LABORATORY DATA:  Laboratory data was reviewed.  His ECG reveals a sinus rhythm with left anterior fascicular block, probable old septal  MI.  Chest x-ray reveals normal heart size.  The aorta is tortuous and calcified. He is noted to have discoid atelectasis in his right middle lobe.  His WBC is 10 with hematocrit of 25.  His platelet count is 458,000.  Normal creatinine, normal potassium, normal liver enzymes.  His albumin is 2.7, cholesterol is 103, triglycerides are 65.  IMPRESSION:  15. A 77 year old gentleman with known coronary artery disease.  He is unable     to provide an adequate exercise history to proceed with further stress     testing.  We will schedule an adenosine Cardiolite for review of possible     reversible ischemia.  Would initiate his baby aspirin when felt safe to do     so from a urological standpoint.  2. Anemia.  This is presumed to be secondary to chronic illness and     hematuria.  He is currently receiving a blood transfusion with attempt to     maintain his hematocrit more than 25.  3. Hypoalbuminemia.  Aggressive dietary support as you are doing.  Further recommendations pending the outcome of his adenosine Cardiolite.  We will attempt to obtain the records of his previous cardiac catheterization. DD:  06/30/01 TD:  07/02/01 Job: [id number]

## 2011-04-16 NOTE — Discharge Summary (Signed)
[facility]  Patient:    Michael Novak, Michael Novak Visit Number: [id number] MRN: [id number]          Service Type: SUR Location: 4W [id number] 01 Attending Physician:  Henrene Dodge Dictated by:   Jamison Neighbor, M.D. Adm. Date:  07/25/2001 Disc. Date: 07/20/01   CC:         Billie Lade, M.D.  Excell Seltzer. Annabell Howells, M.D.  Meade Maw, M.D.   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  SECONDARY DIAGNOSES: 1. History of carcinoma of the prostate. 2. History of diverticulosis. 3. Coronary artery disease. 4. Previous myocardial infarction. 5. Postoperative anemia. 6. History of tobacco use. 7. Candidiasis.  PRINCIPAL PROCEDURE DURING THE HOSPITAL STAY:  Pelvic exoneration, including removal of bladder, prostate, and distal colon with ileal conduit diversion and colostomy diversion.  HISTORY OF PRESENT ILLNESS:  This 77 year old male developed carcinoma of the prostate and underwent radiation seed implantation by Excell Seltzer. Annabell Howells, M.D.  The postoperative CT showed this was a technically poor quality implant without reaching additional seeds implanted.  The patient has had good control of his cancer with decreasing PSAs, but has had a terrible problem with radiation cystitis, radiation proctitis, and urinary retention.  The patient had a resection which did help him to urinate.  However, he required reinsertion of the Foley catheter due to the severity of pain.  The patient was eventually referred to [facility] for hyperbaric oxygen therapy.  At [facility], a catheter was placed that went right through the posterior portion of the prostate into his rectum.  The patient has a severe GI/GU fistula.  It was recommended to him that he undergo pelvic exoneration, but he was told at [facility] that they could not do that for nearly one month.  For  that reason, he returned to [place], [place], and requested that this procedure be done for him here.  After careful discussion of the pros and cons, the decision was made to bring the patient in for hyperalimentation to improve his nutritional status and eventually to perform the pelvic exoneration.  HOSPITAL COURSE:  Because of catheter access issues, a PICC line was placed and the patient was started on TPN.  His preoperative laboratory values showed that he had a hematocrit of only 23.6, so he was given transfusions.  The patient had a very low albumin and for that reason it was felt that he would require appropriate nutritional support.  A pulmonary consultation was sought and they felt that the patient would be an adequate candidate for surgery of this nature.  A cardiology consult was also sought.  They recommended that the patient undergo nuclear scanning.  The scan was somewhat equivocal and they recommended that he undergo a cardiac catheterization.  The catheterization was performed.  This did not show any evidence of active disease.  The patients initial surgery was canceled and put on delay because of requirement for the catheterization.  Once that had been cleared, the patient was cleared for surgery and additional plans were made for him to undergo the procedure on July 06, 2001.  The patient did require additional blood in the preoperative period in order to normalize his studies prior to the procedure.  Prior to undergoing the procedure, a Gastrografin enema was done.  This showed a very large fistula and did not really allow for any improved imaging of the remainder of the lower  GI tract.  On July 06, 2001, the patient underwent pelvic exoneration with Dr. Bobbye Charleston assistance.  The GI portion of the procedure was performed by Anselm Pancoast. Zachery Dakins, M.D.  The patient went to the intensive care unit with an NG, an IJ line, a PICC line, two #10 Blake  drains, a Foley through the penis as a pelvic drain, the ileostomy with bilateral double-Js, and a colostomy.  He had a very long, but relatively unremarkable postoperative course.  We had critical care medicine follow him during his time in the ICU.  The patient did surprisingly well in the early postoperative period.  He did not have much JP drainage.  He had good output by the Foley. He did not have much in the way of pain.  The patient was moved to the floor. Once he began to pass gas and had early bowel movements, his NG tube was removed.  He tolerated this without difficulty.  The patient was eventually started on a clear liquid diet and was slowly advanced.  We monitored his laboratory studies carefully and did give him additional blood in the postoperative period.  The patient had some intermittent abdominal pain and decided that he felt "sick."  He really did not like the clear liquid diet. This actually settled down once he was given a full liquid diet.  By July 13, 2001, the patients PCA was stopped, his JP drains were removed, and he was started on p.o. medications.  The patient was very concerned about his long-term care and requested that we look into a nursing home or to the subacute unit.  Arrangements were made for the patient to go to subacute.  We maintained him on TPN until the following week and then began to slowly taper him.  It was thought that once that was tapered, the patient would be a candidate either for discharge or to the subacute unit.  The patient had slow wean of his TPN.  By the time the patient was scheduled to go to [facility], he actually felt well enough to go home.  The patient had been ambulating with physical therapy.  He changed his mind and requested that he be discharged. The patient was sent home on Megace, aspirin, Zocor, and Nystatin to his anal  area.  We will watch to see if the patient will require return of his antidepressants.  The strong  pain medicines that he had required prior to the procedure were no longer required.  The patient had no problems with his wound.  He was able to handle both the colostomy and the ostomy without any difficulty.  He noted that the severe pain he had prior to the procedure had cleared.  The patient will return in follow-up in one to two weeks. Dictated by:   Jamison Neighbor, M.D. Attending Physician:  Henrene Dodge DD:  07/25/01 TD:  07/25/01 Job: [id number]

## 2011-04-16 NOTE — Procedures (Signed)
[facility]  Patient:    Michael Novak, Michael Novak                      MRN: [id number] Proc. Date: 07/07/00 Adm. Date:  [id number] Attending:  Judeth Cornfield CC:         Excell Seltzer. Annabell Howells, M.D.  Billie Lade, M.D.   Procedure Report  HISTORY OF PRESENT ILLNESS:  Mr. Bhola has been complaining of diarrhea. He occasionally has rectal bleeding. He received radiation implants for prostate cancer in August of 2000 and January 2001.  INFORMED CONSENT:  The patient provided consent after risks, benefits, and alternatives were explained.  MEDICATIONS:  Versed 5, fentanyl 100 mcg IV.  DESCRIPTION OF PROCEDURE:  The patient was placed in the left lateral decubitus position, administered continuous low flow oxygen and was placed on pulse oximetry. The Olympus video colonoscope was inserted into the cecum which was identified the ileocecal valve. The prep was good. The scope was then withdrawn. All areas of the colon were examined.  FINDINGS:  There are a few scattered diverticulum in the sigmoid, transverse and ascending colon. No mucosal abnormalities were seen. There was no fresh or old blood.  IMPRESSION: 1. Diverticulosis. 2. Rectal bleeding that is probably secondary to internal hemorrhoids.  RECOMMENDATIONS: 1. Imodium p.r.n. 2. Fiber supplementation Perdiem 1 teaspoon daily). DD:  07/07/00 TD:  07/07/00 Job: [id number]

## 2011-05-18 ENCOUNTER — Encounter (INDEPENDENT_AMBULATORY_CARE_PROVIDER_SITE_OTHER): Payer: Self-pay | Admitting: General Surgery

## 2011-05-18 DIAGNOSIS — Z972 Presence of dental prosthetic device (complete) (partial): Secondary | ICD-10-CM | POA: Insufficient documentation

## 2011-05-18 DIAGNOSIS — H9190 Unspecified hearing loss, unspecified ear: Secondary | ICD-10-CM | POA: Insufficient documentation

## 2011-05-18 DIAGNOSIS — C61 Malignant neoplasm of prostate: Secondary | ICD-10-CM

## 2011-05-18 DIAGNOSIS — K469 Unspecified abdominal hernia without obstruction or gangrene: Secondary | ICD-10-CM | POA: Insufficient documentation

## 2011-05-18 DIAGNOSIS — Z460 Encounter for fitting and adjustment of spectacles and contact lenses: Secondary | ICD-10-CM | POA: Insufficient documentation

## 2011-06-11 ENCOUNTER — Inpatient Hospital Stay (HOSPITAL_COMMUNITY)
Admission: EM | Admit: 2011-06-11 | Discharge: 2011-06-17 | DRG: 683 | Disposition: A | Discharge status: Home / Self Care | Payer: Medicare Other | Attending: Family Medicine | Admitting: Family Medicine

## 2011-06-11 DIAGNOSIS — N179 Acute kidney failure, unspecified: Principal | ICD-10-CM | POA: Diagnosis present

## 2011-06-11 DIAGNOSIS — Z8546 Personal history of malignant neoplasm of prostate: Secondary | ICD-10-CM

## 2011-06-11 DIAGNOSIS — Z938 Other artificial opening status: Secondary | ICD-10-CM

## 2011-06-11 DIAGNOSIS — N1330 Unspecified hydronephrosis: Secondary | ICD-10-CM | POA: Diagnosis present

## 2011-06-11 DIAGNOSIS — J441 Chronic obstructive pulmonary disease with (acute) exacerbation: Secondary | ICD-10-CM | POA: Diagnosis present

## 2011-06-11 DIAGNOSIS — K5900 Constipation, unspecified: Secondary | ICD-10-CM | POA: Diagnosis present

## 2011-06-11 DIAGNOSIS — M109 Gout, unspecified: Secondary | ICD-10-CM | POA: Diagnosis present

## 2011-06-11 DIAGNOSIS — Z933 Colostomy status: Secondary | ICD-10-CM

## 2011-06-11 DIAGNOSIS — E785 Hyperlipidemia, unspecified: Secondary | ICD-10-CM | POA: Diagnosis present

## 2011-06-11 DIAGNOSIS — E860 Dehydration: Secondary | ICD-10-CM | POA: Diagnosis present

## 2011-06-11 DIAGNOSIS — R112 Nausea with vomiting, unspecified: Secondary | ICD-10-CM | POA: Diagnosis present

## 2011-06-11 DIAGNOSIS — R0902 Hypoxemia: Secondary | ICD-10-CM | POA: Diagnosis present

## 2011-06-12 ENCOUNTER — Observation Stay (HOSPITAL_COMMUNITY): Payer: Medicare Other

## 2011-06-12 ENCOUNTER — Emergency Department (HOSPITAL_COMMUNITY): Payer: Medicare Other

## 2011-06-12 LAB — BLOOD GAS, ARTERIAL
Acid-Base Excess: 0.2 mmol/L (ref 0.0–2.0)
Drawn by: 347621
O2 Content: 4 L/min
O2 Saturation: 93.4 %
Patient temperature: 98.6
pO2, Arterial: 70.9 mmHg — ABNORMAL LOW (ref 80.0–100.0)

## 2011-06-12 LAB — PROTIME-INR
INR: 1.29 (ref 0.00–1.49)
Prothrombin Time: 16.3 seconds — ABNORMAL HIGH (ref 11.6–15.2)

## 2011-06-12 LAB — DIFFERENTIAL
Basophils Relative: 0 % (ref 0–1)
Eosinophils Absolute: 0 10*3/uL (ref 0.0–0.7)
Lymphs Abs: 1.4 10*3/uL (ref 0.7–4.0)
Monocytes Absolute: 1.3 10*3/uL — ABNORMAL HIGH (ref 0.1–1.0)
Monocytes Relative: 17 % — ABNORMAL HIGH (ref 3–12)
Neutro Abs: 5.1 10*3/uL (ref 1.7–7.7)
Neutrophils Relative %: 65 % (ref 43–77)

## 2011-06-12 LAB — CBC
HCT: 42.3 % (ref 39.0–52.0)
Hemoglobin: 14.4 g/dL (ref 13.0–17.0)
Hemoglobin: 13 g/dL (ref 13.0–17.0)
MCH: 31 pg (ref 26.0–34.0)
MCH: 30.2 pg (ref 26.0–34.0)
MCHC: 34 g/dL (ref 30.0–36.0)
MCHC: 33.3 g/dL (ref 30.0–36.0)
MCV: 91 fL (ref 78.0–100.0)
MCV: 90.7 fL (ref 78.0–100.0)
Platelets: 189 10*3/uL (ref 150–400)
RBC: 4.3 MIL/uL (ref 4.22–5.81)
RDW: 13.5 % (ref 11.5–15.5)
WBC: 9.6 10*3/uL (ref 4.0–10.5)

## 2011-06-12 LAB — BASIC METABOLIC PANEL
BUN: 28 mg/dL — ABNORMAL HIGH (ref 6–23)
CO2: 27 mEq/L (ref 19–32)
Calcium: 9.5 mg/dL (ref 8.4–10.5)
Calcium: 8.8 mg/dL (ref 8.4–10.5)
Chloride: 99 mEq/L (ref 96–112)
Creatinine, Ser: 1.96 mg/dL — ABNORMAL HIGH (ref 0.50–1.35)
Creatinine, Ser: 2.07 mg/dL — ABNORMAL HIGH (ref 0.50–1.35)
GFR calc Af Amer: 38 mL/min — ABNORMAL LOW (ref >60)
GFR calc non Af Amer: 33 mL/min — ABNORMAL LOW (ref >60)
GFR calc non Af Amer: 31 mL/min — ABNORMAL LOW (ref >60)
Glucose, Bld: 108 mg/dL — ABNORMAL HIGH (ref 70–99)
Potassium: 4.3 mEq/L (ref 3.5–5.1)
Sodium: 135 mEq/L (ref 135–145)
Sodium: 134 mEq/L — ABNORMAL LOW (ref 135–145)

## 2011-06-12 LAB — URINALYSIS, ROUTINE W REFLEX MICROSCOPIC
Glucose, UA: NEGATIVE mg/dL
Ketones, ur: NEGATIVE mg/dL
Protein, ur: >300 mg/dL — AB

## 2011-06-12 LAB — URINE MICROSCOPIC-ADD ON

## 2011-06-12 LAB — APTT: aPTT: 38 seconds — ABNORMAL HIGH (ref 24–37)

## 2011-06-12 MED ORDER — TECHNETIUM TO 99M ALBUMIN AGGREGATED
5.5000 | Freq: Once | INTRAVENOUS | Status: AC | PRN
  Administered 2011-06-12: 6 via INTRAVENOUS

## 2011-06-12 MED ORDER — XENON XE 133 GAS
12.6000 | GAS_FOR_INHALATION | Freq: Once | RESPIRATORY_TRACT | Status: AC | PRN
  Administered 2011-06-12: 13 via RESPIRATORY_TRACT

## 2011-06-13 ENCOUNTER — Observation Stay (HOSPITAL_COMMUNITY): Payer: Medicare Other

## 2011-06-13 LAB — CBC
HCT: 36.7 % — ABNORMAL LOW (ref 39.0–52.0)
Hemoglobin: 12.4 g/dL — ABNORMAL LOW (ref 13.0–17.0)
MCH: 31.1 pg (ref 26.0–34.0)
MCHC: 33.8 g/dL (ref 30.0–36.0)
MCV: 92 fL (ref 78.0–100.0)
RBC: 3.99 MIL/uL — ABNORMAL LOW (ref 4.22–5.81)

## 2011-06-13 LAB — URINE CULTURE
Colony Count: NO GROWTH
Culture  Setup Time: 201207141951
Culture: NO GROWTH

## 2011-06-13 LAB — BASIC METABOLIC PANEL
CO2: 26 mEq/L (ref 19–32)
Calcium: 9.1 mg/dL (ref 8.4–10.5)
Creatinine, Ser: 1.97 mg/dL — ABNORMAL HIGH (ref 0.50–1.35)
Glucose, Bld: 105 mg/dL — ABNORMAL HIGH (ref 70–99)

## 2011-06-14 ENCOUNTER — Inpatient Hospital Stay (HOSPITAL_COMMUNITY): Payer: Medicare Other

## 2011-06-14 ENCOUNTER — Observation Stay (HOSPITAL_COMMUNITY): Payer: Medicare Other

## 2011-06-14 LAB — BASIC METABOLIC PANEL
CO2: 27 mEq/L (ref 19–32)
Calcium: 9.1 mg/dL (ref 8.4–10.5)
Chloride: 103 mEq/L (ref 96–112)
GFR calc Af Amer: 41 mL/min — ABNORMAL LOW (ref >60)
Sodium: 138 mEq/L (ref 135–145)

## 2011-06-14 LAB — CBC
MCH: 30.2 pg (ref 26.0–34.0)
Platelets: 179 10*3/uL (ref 150–400)
RBC: 4.04 MIL/uL — ABNORMAL LOW (ref 4.22–5.81)
RDW: 13.5 % (ref 11.5–15.5)
WBC: 6.3 10*3/uL (ref 4.0–10.5)

## 2011-06-15 ENCOUNTER — Inpatient Hospital Stay (HOSPITAL_COMMUNITY): Payer: Medicare Other

## 2011-06-15 LAB — COMPREHENSIVE METABOLIC PANEL
ALT: 17 U/L (ref 0–53)
AST: 17 U/L (ref 0–37)
Alkaline Phosphatase: 42 U/L (ref 39–117)
CO2: 26 mEq/L (ref 19–32)
Calcium: 9.6 mg/dL (ref 8.4–10.5)
Chloride: 102 mEq/L (ref 96–112)
GFR calc Af Amer: 56 mL/min — ABNORMAL LOW (ref >60)
GFR calc non Af Amer: 46 mL/min — ABNORMAL LOW (ref >60)
Glucose, Bld: 129 mg/dL — ABNORMAL HIGH (ref 70–99)
Potassium: 4.8 mEq/L (ref 3.5–5.1)
Sodium: 138 mEq/L (ref 135–145)
Total Bilirubin: 0.3 mg/dL (ref 0.3–1.2)

## 2011-06-15 LAB — DIFFERENTIAL
Basophils Relative: 0 % (ref 0–1)
Eosinophils Absolute: 0 10*3/uL (ref 0.0–0.7)
Monocytes Relative: 5 % (ref 3–12)
Neutro Abs: 3.6 10*3/uL (ref 1.7–7.7)
Neutrophils Relative %: 73 % (ref 43–77)

## 2011-06-15 LAB — CBC
Hemoglobin: 13 g/dL (ref 13.0–17.0)
MCH: 31.1 pg (ref 26.0–34.0)
Platelets: 199 10*3/uL (ref 150–400)
RBC: 4.18 MIL/uL — ABNORMAL LOW (ref 4.22–5.81)
WBC: 4.9 10*3/uL (ref 4.0–10.5)

## 2011-06-16 LAB — BASIC METABOLIC PANEL
Calcium: 9.6 mg/dL (ref 8.4–10.5)
GFR calc Af Amer: >60 mL/min (ref >60)
GFR calc non Af Amer: 50 mL/min — ABNORMAL LOW (ref >60)
Glucose, Bld: 102 mg/dL — ABNORMAL HIGH (ref 70–99)
Potassium: 4.1 mEq/L (ref 3.5–5.1)
Sodium: 139 mEq/L (ref 135–145)

## 2011-06-16 LAB — CBC
Hemoglobin: 12.3 g/dL — ABNORMAL LOW (ref 13.0–17.0)
MCH: 30.4 pg (ref 26.0–34.0)
MCHC: 33.7 g/dL (ref 30.0–36.0)
Platelets: 213 10*3/uL (ref 150–400)
RDW: 13.1 % (ref 11.5–15.5)

## 2011-06-21 NOTE — H&P (Signed)
NAME:  Michael Novak, Michael Novak NO.:  [id number]  MEDICAL RECORD NO.:  [id number]  LOCATION:  1432                         FACILITY:  [facility]  PHYSICIAN:  Della Goo, M.D. DATE OF BIRTH:  1930-11-05  DATE OF ADMISSION:  06/12/2011 DATE OF DISCHARGE:                             HISTORY & PHYSICAL   PRIMARY CARE PHYSICIAN:  [place].  CHIEF COMPLAINT:  Weakness, nausea, vomiting, fever.  HISTORY OF PRESENT ILLNESS:  This is a 77 year old male who presents to the emergency department secondary to complaints of worsening nausea with vomiting over the past 5 days.  The patient also reports having fevers and chills.  He has a colostomy and states that he did have high output of watery stool from his colostomy the first day, but then began to have no output from the ostomy at all.  He reports also having decreased appetite and not eating for the past 4 days.  The patient was seen in the emergency department and evaluated.  He was administered gentle IV fluids and antiemetic therapy; however, when he was stood up he became hypoxic with decreased O2 saturation into the 80s.  An arterial blood gas was performed along with D-dimer and the patient was retained for medical admission.    The patient has recently been started on antibiotic therapy of Cipro for urinary tract infection, of which he has only taken one days worth.   The second day he could not take any of the medication secondary to his  nausea and vomiting.  In the emergency department, the patient was  evaluated for urinary tract infection and was found to have a positive UA and was administered ciprofloxacin IV.  PAST MEDICAL HISTORY: 1. Prostate cancer diagnosed in 2001, status post radical therapy with     colostomy placement and urostomy placement. 2. History of hyperlipidemia.  PAST SURGICAL HISTORY: 1. History of small bowel obstructions in the past, status post colon     resection. 2. Status post  right clavicle separation repair. 3. Status post inguinal hernias on both sides, repaired x2.  ALLERGIES:  No known drug allergies.  MEDICATIONS:  Ciprofloxacin, Neurontin, Wellbutrin, Zocor, aspirin, multivitamin, and glucosamine/chondroitin.  SOCIAL HISTORY:  The patient is married.  His son lives in the area.  He is a nonsmoker and he does report alcohol usage of 1 glass of red wine with dinner.  He denies any illicit drug usage.  FAMILY HISTORY:  Positive for longevity in both parents.  REVIEW OF SYSTEMS:  Pertinents mentioned above.  PHYSICAL EXAMINATION FINDINGS:  GENERAL:  This is a pleasant, morbidly obese elderly 77 year old Caucasian male who is in no acute distress. VITAL SIGNS:  Temperature 99.1, blood pressure 121/67, heart rate 81, respirations 18, O2 saturation 89% to 94%. HEENT:  Normocephalic, atraumatic.  Pupils equally round, reactive to light.  Extraocular movements are intact.  Funduscopic benign.  There is no scleral icterus.  Nares are patent bilaterally.  Oropharynx is clear. NECK:  Supple.  Full range of motion.  No thyromegaly, adenopathy, jugular venous distention. CARDIOVASCULAR:  Regular rate and rhythm.  No murmurs, gallops, or rubs appreciated. LUNGS:  Clear to auscultation bilaterally.  No rales, rhonchi, or  wheezes. ABDOMEN:  Positive bowel sounds, soft, nontender, nondistended.  No hepatosplenomegaly.  The patient has a colostomy.  On the left lower quadrant of the abdominal area, there is also a large hernia around the colostomy.  The patient also has a urostomy on the right lower quadrant of the abdomen.  The patient has a midline surgical scar, which is healed. EXTREMITIES:  Without cyanosis, clubbing, or edema. NEUROLOGIC:  Generalized weakness, but otherwise no focal deficits.  LABORATORY STUDIES:  White blood cell count 9.6, hemoglobin 14.4, hematocrit 42.3, MCV 91.0, platelets 189.  Sodium 135, potassium 4.5, chloride 99, CO2 27, BUN  27, creatinine 1.96, and glucose 110. Urinalysis, positive large hemoglobin, greater than 300 protein, large leukocyte esterase.  Urine microscopic with white blood cells and red blood cells, which are too numerous to count.  Acute abdominal series revealing no acute abnormality, emphysematous changes are seen.  ASSESSMENT:  A 77 year old male being admitted with: 1. Hypoxic episode. 2. Shortness of breath. 3. Nausea and vomiting. 4. Mild dehydration.  PLAN:  The patient will be admitted.  He will be placed on gentle IV fluids for rehydration therapy.  Antiemetics have been ordered for his nausea and vomiting.  His diet will be started at a clear liquids and advanced as tolerated to a regular diet.  His medications will be further reconciled and the patient has been placed on supplemental oxygen at this time.  An ABG is pending as well as a D-dimer.  Pending these results, further workup will ensue.     Della Goo, M.D.     HJ/MEDQ  D:  06/12/2011  T:  06/12/2011  Job:  [id number]  Electronically Signed by Della Goo M.D. on 06/21/2011 12:38:14 PM

## 2011-06-24 NOTE — Discharge Summary (Signed)
NAME:  Michael Novak, Michael Novak NO.:  [id number]  MEDICAL RECORD NO.:  [id number]  LOCATION:  1432                         FACILITY:  [facility]  PHYSICIAN:  Erick Blinks, MD     DATE OF BIRTH:  06-12-1930  DATE OF ADMISSION:  06/11/2011 DATE OF DISCHARGE:  06/17/2011                              DISCHARGE SUMMARY   PRIMARY CARE PHYSICIAN:  Lenon Curt. Chilton Si, MD  UROLOGIST:  Jamison Neighbor, MD, in [place].  DISCHARGE DIAGNOSES: 1. Acute renal failure, improved. 2. Constipation with decrease colostomy output, resolved. 3. Acute gouty arthritis, improved. 4. Mild exacerbation of chronic obstructive pulmonary disease,     resolved. 5. Chronic bilateral hydronephrosis. 6. Nausea and vomiting secondary to constipation with decrease     colostomy output, resolved. 7. History of prostate cancer, status post transurethral resection of     prostate, ileal conduit and colostomy. 8. Hyperlipidemia.  DISCHARGE MEDICATIONS:1. Prednisone 10 mg via taper. 2. MiraLax 17 g p.o. daily. 3. Neurontin 300 mg p.o. t.i.d. 4. Wellbutrin SR 150 mg p.o. b.i.d. 5. Zocor 20 mg p.o. daily. 6. Enteric-coated aspirin 81 mg daily. 7. Senior multivitamins 1 tablet p.o. daily. 8. Calcium, magnesium over-the-counter 1 tablet p.o. daily. 9. Vitamin B12 500 mg 1 tablet p.o. daily. 10.Glucosamine/chondroitin over-the-counter 1 tablet p.o. b.i.d. 11.Albuterol inhaler 2 puffs inhaled q.4 h p.r.n.  ADMISSION HISTORY:  This is a pleasant 77 year old gentleman who presents to the emergency room with weakness, nausea, and vomiting.  The patient also reported some fevers and chills.  He had noted high output watery stools from his colostomy on the first day after which he noticed no output at all.  He started to have decreased appetite and was not eating anything for 4 days prior to admission.  He was evaluated in the emergency room, was given IV fluids and antiemetic therapy.  Upon standing, he was  noted to have a decrease in oxygen saturation to 80%. He was subsequently admitted for further evaluation.  For details, please refer to the history and physical per Dr. Lovell Sheehan.  HOSPITAL COURSE: 1. Acute renal failure.  The patient was noted to have elevation of     creatinine to 2.07 on admission.  He was given IV fluids and this     improved to 1.38.  Also conducted in the workup was a renal     ultrasound which showed mild-to-moderate right-sided     hydronephrosis.  This was also confirmed on subsequent CAT scan of     the abdomen and pelvis, which showed progressive right-sided     prominent hydronephrosis with persistent moderate left-sided     hydronephrosis.  This was further reviewed with Dr. Annabell Howells from     Urology.  It was compared to prior CT abdomen and pelvis done in     January 2011 and it was noted that the patient has chronic     hydronephrosis.  It was felt unlikely that this was the cause of     his renal failure.  The patient's creatinine improves with IV     hydration.  He is scheduled to follow up with his primary urologist     on  Monday and he is advised to keep this appointment. 2. Constipation.  The patient had a CT of abdomen and pelvis done to     rule out any signs of obstruction.  Due to his complicated bowel     history, it was noted that there was formation of a parastomal     hernia containing bowel loops but not causing obstruction.  There     was right posterior inferior pararectal hernia containing bowel     loops without obstruction.  This was again reviewed with Dr.     Johna Sheriff from [facility].  CT of the abdomen and     pelvis was reviewed and it was felt appropriate to start the     patient on a gentle laxative.  The patient was started on MiraLax     as well as milk of magnesia.  With these, he is having good bowel     movements.  His abdominal distention has since resolved and he is     feeling significantly better. 3. Acute  gout.  Due to the patient's renal failure, indomethacin as     well as colchicine could not be used.  He was placed on a     prednisone taper which improved his gout. 4. Mild COPD.  The patient was noted to have some wheezing on     admission.  He does have smoking history in the past but up until     this point has not had any trouble with his breathing.  The     steroids he received for his gout also improved his breathing.  He     was placed on albuterol nebulization treatments.  Currently he is     not having any breathing difficulty.  He is saturating in the 90s     on room air and is ambulating without any difficulty.  He does not     have any audible wheezes or any abnormal lung sounds.  He will be     discharged on with albuterol inhaler as needed and can follow up     with his primary care physician for further management.  CONSULTATION: 1. Telephone consultation with Dr. Annabell Howells. 2. Telephone consultation with Dr. Johna Sheriff.  DIAGNOSTIC IMAGING: 1. Acute abdominal series on admission shows no acute abnormalities,     scarring, and emphysematous changes at the lung bases.  Abdominal     aortic aneurysm appear stable, no visible bowel obstruction. 2. V/Q scan done on admission shows low probability for acute     pulmonary embolus, obstructive pulmonary disease. 3. Renal ultrasound done on June 14, 2011, shows mild-to-moderate     right-sided hydronephrosis. 4. CT abdomen and pelvis on June 14, 2011, shows post left lower upper     abdomen/pelvic colostomy formation with parastomal hernia     containing bowel loops but not causing obstruction.  Right     posterior inferior pararectal hernia containing bowel loops without     obstruction.  No extraluminal bowel inflammatory process noted.     Post cystectomy with right ureterostomy, progressive right-sided     prominent hydronephrosis with persistent moderate left-sided     hydronephrosis without any gallstones versus radiopaque  sludge,     prominent coronary artery calcifications, slight increase size of     abdominal aneurysm. 5. Chest x-ray from June 15, 2011, shows linear atelectasis in both     lung bases.  DISCHARGE INSTRUCTIONS:  The patient should follow  up with his primary care physician in the next 1 to 2 weeks.  He already has a followup appointment with his urologist on Monday.  He should continue on a heart- healthy, low-calorie diet.  Conduct his activity as tolerated.  CONDITION AT TIME OF DISCHARGE:  Improved.  He was also assessed by Physical Therapy and Occupational Therapy and felt that he did not require any outpatient therapy.  Plan was discussed with the patient as well as his family who agree. Time spent coordinating this discharge is 45 minutes.     Erick Blinks, MD     JM/MEDQ  D:  06/17/2011  T:  06/17/2011  Job:  [id number]  cc:   Lenon Curt. Chilton Si, M.D. Fax: [phone]  Jamison Neighbor, M.D. Fax: [phone]  Electronically Signed by Erick Blinks  on 06/24/2011 12:58:01 AM

## 2011-09-21 ENCOUNTER — Other Ambulatory Visit: Payer: Self-pay | Admitting: Internal Medicine

## 2011-11-03 ENCOUNTER — Other Ambulatory Visit: Payer: Self-pay | Admitting: Urology

## 2011-11-03 DIAGNOSIS — Z9889 Other specified postprocedural states: Secondary | ICD-10-CM

## 2011-11-04 ENCOUNTER — Ambulatory Visit
Admission: RE | Admit: 2011-11-04 | Discharge: 2011-11-04 | Disposition: A | Discharge status: Home / Self Care | Payer: Medicare Other | Source: Ambulatory Visit | Attending: Urology | Admitting: Urology

## 2011-11-04 DIAGNOSIS — Z9889 Other specified postprocedural states: Secondary | ICD-10-CM

## 2011-11-25 ENCOUNTER — Encounter (INDEPENDENT_AMBULATORY_CARE_PROVIDER_SITE_OTHER): Payer: Self-pay | Admitting: Surgery

## 2011-11-26 ENCOUNTER — Ambulatory Visit (INDEPENDENT_AMBULATORY_CARE_PROVIDER_SITE_OTHER): Payer: Medicare Other | Admitting: Surgery

## 2011-11-26 ENCOUNTER — Encounter (INDEPENDENT_AMBULATORY_CARE_PROVIDER_SITE_OTHER): Payer: Self-pay | Admitting: Surgery

## 2011-11-26 VITALS — BP 128/70 | HR 45 | Temp 97.8°F | Resp 16 | Ht 74.0 in | Wt 226.0 lb

## 2011-11-26 DIAGNOSIS — K9409 Other complications of colostomy: Secondary | ICD-10-CM

## 2011-11-26 DIAGNOSIS — R001 Bradycardia, unspecified: Secondary | ICD-10-CM | POA: Insufficient documentation

## 2011-11-26 DIAGNOSIS — IMO0002 Reserved for concepts with insufficient information to code with codable children: Secondary | ICD-10-CM

## 2011-11-26 DIAGNOSIS — I498 Other specified cardiac arrhythmias: Secondary | ICD-10-CM

## 2011-11-26 DIAGNOSIS — J449 Chronic obstructive pulmonary disease, unspecified: Secondary | ICD-10-CM | POA: Insufficient documentation

## 2011-11-26 HISTORY — DX: Other complications of colostomy: K94.09

## 2011-11-26 HISTORY — DX: Bradycardia, unspecified: R00.1

## 2011-11-26 NOTE — Progress Notes (Signed)
Subjective:     Patient ID: Michael Novak, male   DOB: Mar 30, 1930, 77 y.o.   MRN: [id number]  HPI  Michael Novak  1930-09-28 [id number]  Patient Care Team: Kimber Relic, MD as PCP - General (Internal Medicine) Jamison Neighbor as Consulting Physician (Urology)  This patient is a 77 y.o.male who presents today for surgical evaluation at the request of Dr. Logan Bores.   Reason for visit: Recurrent parastomal hernia around the colostomy and left abdomen. Consideration of 3-repair  Patient is an overweight male who's had numerous abdominal surgeries. He had seeding done for prostate cancer. This was complicated with a rectourethral fistula. He required pelvic exoneration with ileal conduit and permanent end colostomy in 2002. He recovered from that. He did develop a bowel obstruction that imroved conservatively in 2006.  He was noted to have parastomal hernias around that time.  Our group evaluated him and felt that risks of repair was higher than risks of not. Therefore no surgery was done.  The parastomal hernias got larger. He went to [facility] & had laparoscopic repair of both ileal conduit and colostomy peristomal hernias. Dr. Barnetta Chapel used Proceed  around of the ileal conduit. They used PArietex & Surgimend around the colostomy site on the left.  He developed obstruction and had to have a hematoma evacuated around the colostomy on postop day one. He gradually recovered  The patient developed a bowel obstruction last year. He required open lysis of adhesions. He required removal of a fair amount of his pericolostomy mesh. He had primary repair of a paracolostomy hernia. He had lysed adhesions for transition point seen to be down in the pelvis. He gradually improved. He's been followed by Dr. Zachery Dakins this past year. He notes that the parastomal hernia around the colostomy has come back and has gotten larger over the past year. Dr. Logan Bores, his urologist, recommended considering surgical evaluation to see if  he would benefit from repeat attempt at repair. Because I felt Dr. Logan Bores in the past, he sent the patient to me.  The patient used exercise regularly but has not done so the past year. No severe shortness of breath or dyspnea.  He did note that his heart rate has been found to be in the 30-40s for the past month. That is unusual for him. No lightheadedness or dizziness. He wonders if he needs to have that checked.  Patient Active Problem List  Diagnoses  . Wears dentures/gum disease  . Prostate cancer  . Hearing loss  . Contact lens/glasses fitting  . Parastomal hernia at left colostomy  . Bradycardia, HR 30-40s, Nov-Dec2012  . COPD (chronic obstructive pulmonary disease)    Past Medical History  Diagnosis Date  . History of colostomy   . Urostomy stenosis     Ask patient to clarify, per history form dated 04/02/11.  Marland Kitchen Peristomal hernia     Past Surgical History  Procedure Date  . Cystectomy, ileal coduit, colostomy 2002    for severe rectourethral fistula  . Ex lap, lysis of adhesions 2010    for SBO  . Lap repair of parastomal hernias 2009    Dr. Barnetta Chapel [facility],  Hematoma evac POD#1    History   Social History  . Marital Status: Married    Spouse Name: N/A    Number of Children: N/A  . Years of Education: N/A   Occupational History  . Not on file.   Social History Main Topics  . Smoking status: Never Smoker   .  Smokeless tobacco: Not on file  . Alcohol Use: No  . Drug Use:   . Sexually Active:    Other Topics Concern  . Not on file   Social History Narrative  . No narrative on file    No family history on file.  Current outpatient prescriptions:aspirin 81 MG tablet, Take 81 mg by mouth daily. Ask patient to clarify dosage. Not indicated on history form dated 04/02/11. , Disp: , Rfl: ;  buPROPion (WELLBUTRIN SR) 150 MG 12 hr tablet, Take 150 mg by mouth 2 (two) times daily.  , Disp: , Rfl: ;  calcium & magnesium carbonates (MYLANTA) 311-232 MG per tablet, Take 1  tablet by mouth daily.  , Disp: , Rfl:  gabapentin (NEURONTIN) 300 MG capsule, Take 300 mg by mouth 3 (three) times daily.  , Disp: , Rfl: ;  Multiple Vitamin (MULTIVITAMIN) tablet, Take 1 tablet by mouth daily.  , Disp: , Rfl: ;  simvastatin (ZOCOR) 20 MG tablet, Take 20 mg by mouth at bedtime.  , Disp: , Rfl: ;  vitamin B-12 (CYANOCOBALAMIN) 500 MCG tablet, Take 500 mcg by mouth daily.  , Disp: , Rfl:   No Known Allergies  BP 128/70  Pulse 45  Temp(Src) 97.8 F (36.6 C) (Temporal)  Resp 16  Ht 6\' 2"  (1.88 m)  Wt 226 lb (102.513 kg)  BMI 29.02 kg/m2     Review of Systems  Constitutional: Negative for fever, chills and diaphoresis.  HENT: Positive for hearing loss and congestion. Negative for nosebleeds, sore throat, facial swelling, mouth sores, trouble swallowing and ear discharge.   Eyes: Negative for photophobia, discharge and visual disturbance.  Respiratory: Negative for cough, choking, chest tightness, wheezing and stridor.   Cardiovascular: Negative for chest pain, palpitations and leg swelling.       Low heart rate noted past month  Gastrointestinal: Negative for nausea, vomiting, abdominal pain, diarrhea, constipation, blood in stool, anal bleeding and rectal pain.  Genitourinary: Negative for dysuria, urgency, difficulty urinating and testicular pain.  Musculoskeletal: Negative for myalgias, back pain, arthralgias and gait problem.  Skin: Negative for color change, pallor, rash and wound.  Neurological: Negative for dizziness, speech difficulty, weakness, numbness and headaches.  Hematological: Negative for adenopathy. Does not bruise/bleed easily.  Psychiatric/Behavioral: Negative for hallucinations, confusion and agitation.       Objective:   Physical Exam  Constitutional: He is oriented to person, place, and time. He appears well-developed and well-nourished. No distress.  HENT:  Head: Normocephalic.  Mouth/Throat: Oropharynx is clear and moist. No oropharyngeal  exudate.  Eyes: Conjunctivae and EOM are normal. Pupils are equal, round, and reactive to light. No scleral icterus.  Neck: Normal range of motion. Neck supple. No tracheal deviation present.  Cardiovascular: Normal rate, regular rhythm and intact distal pulses.   Pulmonary/Chest: Effort normal and breath sounds normal. No respiratory distress.  Abdominal: Soft. He exhibits no distension. There is no tenderness. There is no rebound and no guarding. Hernia confirmed negative in the right inguinal area and confirmed negative in the left inguinal area.       Obvious parastomal hernia LLQ at colostomy.  Partially reducible.  No pain.  Colostomy pink with gas & stool.  Ileal conduit urostomy RLQ clean with no parastomal hernia  Musculoskeletal: Normal range of motion. He exhibits no tenderness.  Lymphadenopathy:    He has no cervical adenopathy.       Right: No inguinal adenopathy present.       Left: No  inguinal adenopathy present.  Neurological: He is alert and oriented to person, place, and time. No cranial nerve deficit. He exhibits normal muscle tone. Coordination normal.  Skin: Skin is warm and dry. No rash noted. He is not diaphoretic. No erythema. No pallor.  Psychiatric: He has a normal mood and affect. His behavior is normal. Judgment and thought content normal.       Assessment:     Recurrent parastomal hernia around colostomy site. No definite evidence of obstruction or strangulation at this time. Numerous prior surgeries    Plan:     This is a difficult situation. He is at risk for obstruction and strangulation with obvious bowel up in the paracolostomy hernia. However, with his numerous prior surgeries including prior hernia repair, he is at higher risk of enterotomy, fistula, bowel injury, ileus, Cardiac & other problems. with attempt at repair. It would be reasonable to try and do this laparoscopically with possible need to convert to open. I would leave the ileal conduit alone.  There is no evidence of hernia in July on CT & not by exam now.  He has no symptoms. That mesh has not been disturbed.  I did discuss the hernia repair with that.  The anatomy & physiology of the abdominal wall was discussed.  The pathophysiology of hernias was discussed.  Natural history risks without surgery of enlargement, pain, incarceration & strangulation was discussed.   Contributors to complications such as smoking, obesity, diabetes, prior surgery, etc were discussed.  I explained laparoscopic techniques with possible need for an open approach.  I noted the probable use of mesh to patch and/or buttress hernia repair.  Usually, I used GoreTex mesh as there is good data that it minimizes erosion into the colon with a more permanent repair over biologic only.  Risks such as bleeding, infection, abscess, need for further treatment, heart attack, death, and other risks were discussed.  I noted a good likelihood this will help address the problem.   Goals of post-operative recovery were discussed as well.  Possibility that this will not correct all symptoms was explained.  I stressed the importance of low-impact activity, aggressive pain control, avoiding constipation, & not pushing through pain to minimize risk of post-operative chronic pain or injury. I stressed the importance of trying to continue to lose weight and keep weight down to minimize progression or recurrence.  Possibility of reherniation was discussed.  We will work to minimize complications.   An educational handout further explaining the pathology & treatment options was given as well.  Questions were answered.  The patient expresses understanding & wishes to proceed with surgery.  With a newly diagnosed bradycardia and him not on any medications that should trigger that, I strongly recommend he discuss with his primary care physician about getting a cardiology evaluation to make sure he does not have other problems. I would want cardiac  clearance anyway.  Overall, he does look okay for a 77 year old.  Basically came down if he had severe unbearable pain and he was willing to put up with high risks, I willing to proceed with surgery. However, I did caution he is at a high risk for having significant problems. I did not sugar coat this at all. He and his wife expressed understanding.   For now, they will hold off on doing any surgery.   They agree that they will see a cardiologist to help address his newly diagnosed bradycardia.

## 2011-11-26 NOTE — Patient Instructions (Signed)
See a CARDIOLOGIST for your slow heart rate = Bradycardia Bradycardia is a term for a heart rate (pulse) that, in adults, is slower than 60 beats per minute. A normal rate is 60 to 100 beats per minute. A heart rate below 60 beats per minute may be normal for some adults with healthy hearts. If the rate is too slow, the heart may have trouble pumping the volume of blood the body needs. If the heart rate gets too low, blood flow to the brain may be decreased and may make you feel lightheaded, dizzy, or faint. The heart has a natural pacemaker in the top of the heart called the SA node (sinoatrial or sinus node). This pacemaker sends out regular electrical signals to the muscle of the heart, telling the heart muscle when to beat (contract). The electrical signal travels from the upper parts of the heart (atria) through the AV node (atrioventricular node), to the lower chambers of the heart (ventricles). The ventricles squeeze, pumping the blood from your heart to your lungs and to the rest of your body. CAUSES   Problem with the heart's electrical system.   Problem with the heart's natural pacemaker.   Heart disease, damage, or infection.   Medications.   Problems with minerals and salts (electrolytes).  SYMPTOMS   Fainting (syncope).   Fatigue and weakness.   Shortness of breath (dyspnea).   Chest pain (angina).   Drowsiness.   Confusion.  DIAGNOSIS   An electrocardiogram (ECG) can help your caregiver determine the type of slow heart rate you have.   If the cause is not seen on an ECG, you may need to wear a heart monitor that records your heart rhythm for several hours or days.   Blood tests.  TREATMENT   Electrolyte supplements.   Medications.   Withholding medication which is causing a slow heart rate.   Pacemaker placement.  SEEK IMMEDIATE MEDICAL CARE IF:   You feel lightheaded or faint.   You develop an irregular heart rate.   You feel chest pain or have trouble  breathing.  MAKE SURE YOU:   Understand these instructions.   Will watch your condition.   Will get help right away if you are not doing well or get worse.  Document Released: 08/07/2002 Document Revised: 07/28/2011 Document Reviewed: 07/03/2008 [facility] Patient Information 2012 [place], [place].  Hernia A hernia occurs when an internal organ pushes out through a weak spot in the abdominal wall. Hernias most commonly occur in the groin and around the navel. Hernias often can be pushed back into place (reduced). Most hernias tend to get worse over time. Some abdominal hernias can get stuck in the opening (irreducible or incarcerated hernia) and cannot be reduced. An irreducible abdominal hernia which is tightly squeezed into the opening is at risk for impaired blood supply (strangulated hernia). A strangulated hernia is a medical emergency. Because of the risk for an irreducible or strangulated hernia, surgery may be recommended to repair a hernia. CAUSES   Heavy lifting.   Prolonged coughing.   Straining to have a bowel movement.   A cut (incision) made during an abdominal surgery.  HOME CARE INSTRUCTIONS   Bed rest is not required. You may continue your normal activities.   Avoid lifting more than 10 pounds (4.5 kg) or straining.   Cough gently. If you are a smoker it is best to stop. Even the best hernia repair can break down with the continual strain of coughing. Even if you  do not have your hernia repaired, a cough will continue to aggravate the problem.   Do not wear anything tight over your hernia. Do not try to keep it in with an outside bandage or truss. These can damage abdominal contents if they are trapped within the hernia sac.   Eat a normal diet.   Avoid constipation. Straining over long periods of time will increase hernia size and encourage breakdown of repairs. If you cannot do this with diet alone, stool softeners may be used.  SEEK IMMEDIATE MEDICAL CARE IF:    You have a fever.   You develop increasing abdominal pain.   You feel nauseous or vomit.   Your hernia is stuck outside the abdomen, looks discolored, feels hard, or is tender.   You have any changes in your bowel habits or in the hernia that are unusual for you.   You have increased pain or swelling around the hernia.   You cannot push the hernia back in place by applying gentle pressure while lying down.  MAKE SURE YOU:   Understand these instructions.   Will watch your condition.   Will get help right away if you are not doing well or get worse.  Document Released: 11/15/2005 Document Revised: 07/28/2011 Document Reviewed: 07/04/2008 [facility] Patient Information 2012 [place], [place].

## 2011-12-01 DIAGNOSIS — R079 Chest pain, unspecified: Secondary | ICD-10-CM

## 2011-12-01 DIAGNOSIS — K4090 Unilateral inguinal hernia, without obstruction or gangrene, not specified as recurrent: Secondary | ICD-10-CM

## 2011-12-01 HISTORY — DX: Chest pain, unspecified: R07.9

## 2011-12-01 HISTORY — DX: Unilateral inguinal hernia, without obstruction or gangrene, not specified as recurrent: K40.90

## 2011-12-13 ENCOUNTER — Inpatient Hospital Stay (HOSPITAL_COMMUNITY)
Admission: EM | Admit: 2011-12-13 | Discharge: 2011-12-17 | DRG: 690 | Disposition: A | Discharge status: Home / Self Care | Payer: Medicare Other | Attending: Internal Medicine | Admitting: Internal Medicine

## 2011-12-13 ENCOUNTER — Encounter (HOSPITAL_COMMUNITY): Payer: Self-pay

## 2011-12-13 ENCOUNTER — Emergency Department (HOSPITAL_COMMUNITY): Payer: Medicare Other

## 2011-12-13 DIAGNOSIS — D696 Thrombocytopenia, unspecified: Secondary | ICD-10-CM | POA: Diagnosis present

## 2011-12-13 DIAGNOSIS — R112 Nausea with vomiting, unspecified: Secondary | ICD-10-CM | POA: Diagnosis present

## 2011-12-13 DIAGNOSIS — Z933 Colostomy status: Secondary | ICD-10-CM

## 2011-12-13 DIAGNOSIS — N179 Acute kidney failure, unspecified: Secondary | ICD-10-CM | POA: Diagnosis present

## 2011-12-13 DIAGNOSIS — N183 Chronic kidney disease, stage 3 unspecified: Secondary | ICD-10-CM | POA: Diagnosis present

## 2011-12-13 DIAGNOSIS — N12 Tubulo-interstitial nephritis, not specified as acute or chronic: Principal | ICD-10-CM | POA: Diagnosis present

## 2011-12-13 DIAGNOSIS — E860 Dehydration: Secondary | ICD-10-CM | POA: Diagnosis present

## 2011-12-13 DIAGNOSIS — E871 Hypo-osmolality and hyponatremia: Secondary | ICD-10-CM | POA: Diagnosis present

## 2011-12-13 DIAGNOSIS — Z8546 Personal history of malignant neoplasm of prostate: Secondary | ICD-10-CM

## 2011-12-13 DIAGNOSIS — M109 Gout, unspecified: Secondary | ICD-10-CM | POA: Diagnosis not present

## 2011-12-13 DIAGNOSIS — E669 Obesity, unspecified: Secondary | ICD-10-CM | POA: Diagnosis present

## 2011-12-13 DIAGNOSIS — N19 Unspecified kidney failure: Secondary | ICD-10-CM | POA: Diagnosis present

## 2011-12-13 DIAGNOSIS — Z938 Other artificial opening status: Secondary | ICD-10-CM

## 2011-12-13 DIAGNOSIS — R0902 Hypoxemia: Secondary | ICD-10-CM | POA: Diagnosis present

## 2011-12-13 DIAGNOSIS — B952 Enterococcus as the cause of diseases classified elsewhere: Secondary | ICD-10-CM | POA: Diagnosis present

## 2011-12-13 DIAGNOSIS — E785 Hyperlipidemia, unspecified: Secondary | ICD-10-CM | POA: Diagnosis present

## 2011-12-13 HISTORY — DX: Malignant neoplasm of prostate: C61

## 2011-12-13 HISTORY — DX: Unspecified malignant neoplasm of skin, unspecified: C44.90

## 2011-12-13 LAB — BASIC METABOLIC PANEL
BUN: 30 mg/dL — ABNORMAL HIGH (ref 6–23)
CO2: 21 mEq/L (ref 19–32)
Chloride: 98 mEq/L (ref 96–112)
Glucose, Bld: 168 mg/dL — ABNORMAL HIGH (ref 70–99)
Potassium: 4.5 mEq/L (ref 3.5–5.1)
Sodium: 132 mEq/L — ABNORMAL LOW (ref 135–145)

## 2011-12-13 LAB — DIFFERENTIAL
Lymphocytes Relative: 7 % — ABNORMAL LOW (ref 12–46)
Lymphs Abs: 0.6 10*3/uL — ABNORMAL LOW (ref 0.7–4.0)
Monocytes Relative: 11 % (ref 3–12)
Neutrophils Relative %: 83 % — ABNORMAL HIGH (ref 43–77)

## 2011-12-13 LAB — URINALYSIS, ROUTINE W REFLEX MICROSCOPIC
Bilirubin Urine: NEGATIVE
Nitrite: POSITIVE — AB
Protein, ur: >300 mg/dL — AB
Specific Gravity, Urine: 1.022 (ref 1.005–1.030)
Urobilinogen, UA: 0.2 mg/dL (ref 0.0–1.0)

## 2011-12-13 LAB — URINE MICROSCOPIC-ADD ON

## 2011-12-13 LAB — CBC
Hemoglobin: 15.4 g/dL (ref 13.0–17.0)
Platelets: 140 10*3/uL — ABNORMAL LOW (ref 150–400)
RBC: 4.88 MIL/uL (ref 4.22–5.81)
WBC: 8.4 10*3/uL (ref 4.0–10.5)

## 2011-12-13 LAB — URINE CULTURE

## 2011-12-13 MED ORDER — SODIUM CHLORIDE 0.9 % IV SOLN
INTRAVENOUS | Status: DC
  Administered 2011-12-13 – 2011-12-16 (×7): via INTRAVENOUS

## 2011-12-13 MED ORDER — ALUM & MAG HYDROXIDE-SIMETH 200-200-20 MG/5ML PO SUSP
30.0000 mL | Freq: Four times a day (QID) | ORAL | Status: DC | PRN
  Administered 2011-12-13: 30 mL via ORAL
  Filled 2011-12-13: qty 30

## 2011-12-13 MED ORDER — SODIUM CHLORIDE 0.9 % IV BOLUS (SEPSIS)
1000.0000 mL | Freq: Once | INTRAVENOUS | Status: AC
  Administered 2011-12-13: 1000 mL via INTRAVENOUS

## 2011-12-13 MED ORDER — CALCIUM & MAGNESIUM CARBONATES 311-232 MG PO TABS
1.0000 | ORAL_TABLET | Freq: Every day | ORAL | Status: DC
  Administered 2011-12-13 – 2011-12-14 (×2): 1 via ORAL

## 2011-12-13 MED ORDER — MORPHINE SULFATE 4 MG/ML IJ SOLN
4.0000 mg | Freq: Once | INTRAMUSCULAR | Status: AC
  Administered 2011-12-13: 4 mg via INTRAVENOUS
  Filled 2011-12-13: qty 1

## 2011-12-13 MED ORDER — SODIUM CHLORIDE 0.9 % IV SOLN
Freq: Once | INTRAVENOUS | Status: AC
  Administered 2011-12-13: 03:00:00 via INTRAVENOUS

## 2011-12-13 MED ORDER — HYDROMORPHONE HCL PF 1 MG/ML IJ SOLN
0.5000 mg | INTRAMUSCULAR | Status: DC | PRN
  Administered 2011-12-13: 1 mg via INTRAVENOUS
  Filled 2011-12-13: qty 1

## 2011-12-13 MED ORDER — SIMVASTATIN 20 MG PO TABS
20.0000 mg | ORAL_TABLET | Freq: Every day | ORAL | Status: DC
  Administered 2011-12-13 – 2011-12-16 (×4): 20 mg via ORAL
  Filled 2011-12-13 (×6): qty 1

## 2011-12-13 MED ORDER — CIPROFLOXACIN IN D5W 400 MG/200ML IV SOLN
400.0000 mg | Freq: Once | INTRAVENOUS | Status: AC
  Administered 2011-12-13: 400 mg via INTRAVENOUS
  Filled 2011-12-13: qty 200

## 2011-12-13 MED ORDER — ONDANSETRON HCL 4 MG/2ML IJ SOLN
4.0000 mg | Freq: Once | INTRAMUSCULAR | Status: AC
  Administered 2011-12-13: 4 mg via INTRAVENOUS
  Filled 2011-12-13: qty 2

## 2011-12-13 MED ORDER — DEXTROSE 5 % IV SOLN
1.0000 g | INTRAVENOUS | Status: DC
  Administered 2011-12-13: 1 g via INTRAVENOUS
  Filled 2011-12-13: qty 10

## 2011-12-13 MED ORDER — GLUCOSAMINE-CHONDROITIN 500-400 MG PO TABS: 1.0000 | ORAL_TABLET | Freq: Three times a day (TID) | ORAL | Status: DC

## 2011-12-13 MED ORDER — ASPIRIN 81 MG PO CHEW
81.0000 mg | CHEWABLE_TABLET | Freq: Every day | ORAL | Status: DC
  Administered 2011-12-13 – 2011-12-17 (×5): 81 mg via ORAL
  Filled 2011-12-13 (×6): qty 1

## 2011-12-13 MED ORDER — ALBUTEROL SULFATE (5 MG/ML) 0.5% IN NEBU: 2.5000 mg | INHALATION_SOLUTION | RESPIRATORY_TRACT | Status: DC | PRN

## 2011-12-13 MED ORDER — GABAPENTIN 300 MG PO CAPS
300.0000 mg | ORAL_CAPSULE | Freq: Three times a day (TID) | ORAL | Status: DC
  Administered 2011-12-13 – 2011-12-17 (×14): 300 mg via ORAL
  Filled 2011-12-13 (×18): qty 1

## 2011-12-13 MED ORDER — DEXTROSE 5 % IV SOLN
2.0000 g | INTRAVENOUS | Status: DC
  Administered 2011-12-13 – 2011-12-15 (×3): 2 g via INTRAVENOUS
  Filled 2011-12-13 (×3): qty 2

## 2011-12-13 MED ORDER — HYDROMORPHONE HCL PF 1 MG/ML IJ SOLN
1.0000 mg | Freq: Once | INTRAMUSCULAR | Status: AC
  Administered 2011-12-13: 1 mg via INTRAVENOUS
  Filled 2011-12-13: qty 1

## 2011-12-13 MED ORDER — SODIUM CHLORIDE 0.9 % IV SOLN
INTRAVENOUS | Status: AC
  Administered 2011-12-13: 13:00:00 via INTRAVENOUS

## 2011-12-13 MED ORDER — OXYCODONE HCL 5 MG PO TABS
5.0000 mg | ORAL_TABLET | ORAL | Status: DC | PRN
  Administered 2011-12-15 – 2011-12-16 (×3): 5 mg via ORAL
  Filled 2011-12-13 (×3): qty 1

## 2011-12-13 MED ORDER — ONDANSETRON HCL 4 MG/2ML IJ SOLN
4.0000 mg | Freq: Four times a day (QID) | INTRAMUSCULAR | Status: DC | PRN
  Administered 2011-12-13 (×2): 4 mg via INTRAVENOUS
  Filled 2011-12-13 (×2): qty 2

## 2011-12-13 MED ORDER — ENOXAPARIN SODIUM 40 MG/0.4ML ~~LOC~~ SOLN
40.0000 mg | Freq: Every day | SUBCUTANEOUS | Status: DC
  Administered 2011-12-13 – 2011-12-14 (×2): 40 mg via SUBCUTANEOUS
  Filled 2011-12-13 (×3): qty 0.4

## 2011-12-13 MED ORDER — ZOLPIDEM TARTRATE 5 MG PO TABS: 5.0000 mg | ORAL_TABLET | Freq: Every evening | ORAL | Status: DC | PRN

## 2011-12-13 MED ORDER — ACETAMINOPHEN 325 MG PO TABS
650.0000 mg | ORAL_TABLET | Freq: Four times a day (QID) | ORAL | Status: DC | PRN
  Administered 2011-12-15 (×2): 650 mg via ORAL
  Filled 2011-12-13 (×2): qty 2

## 2011-12-13 MED ORDER — CYANOCOBALAMIN 500 MCG PO TABS
500.0000 ug | ORAL_TABLET | Freq: Every day | ORAL | Status: DC
  Administered 2011-12-13 – 2011-12-17 (×5): 500 ug via ORAL
  Filled 2011-12-13 (×6): qty 1

## 2011-12-13 MED ORDER — BUPROPION HCL ER (SR) 150 MG PO TB12
150.0000 mg | ORAL_TABLET | Freq: Two times a day (BID) | ORAL | Status: DC
  Administered 2011-12-13 – 2011-12-17 (×9): 150 mg via ORAL
  Filled 2011-12-13 (×12): qty 1

## 2011-12-13 MED ORDER — ONDANSETRON HCL 4 MG PO TABS: 4.0000 mg | ORAL_TABLET | Freq: Four times a day (QID) | ORAL | Status: DC | PRN

## 2011-12-13 MED ORDER — ADULT MULTIVITAMIN W/MINERALS CH
1.0000 | ORAL_TABLET | Freq: Every day | ORAL | Status: DC
  Administered 2011-12-13 – 2011-12-17 (×5): 1 via ORAL
  Filled 2011-12-13 (×6): qty 1

## 2011-12-13 MED ORDER — ACETAMINOPHEN 650 MG RE SUPP: 650.0000 mg | Freq: Four times a day (QID) | RECTAL | Status: DC | PRN

## 2011-12-13 NOTE — Progress Notes (Signed)
ANTIBIOTIC CONSULT NOTE - INITIAL  Pharmacy Consult for Cefepime Indication: pyelonephritis  Allergies  Allergen Reactions  . Augmentin     Patient Measurements: Height: 6\' 2"  (188 cm) Weight: 226 lb (102.513 kg) IBW/kg (Calculated) : 82.2    Vital Signs: Temp: 97.4 F (36.3 C) (01/14 0821) Temp src: Oral (01/14 0821) BP: 117/67 mmHg (01/14 1157) Pulse Rate: 57  (01/14 1157) Intake/Output from previous day:   Intake/Output from this shift:    Labs:  [facility] 12/13/11 0253  WBC Michael.4  HGB 15.4  PLT 140*  LABCREA --  CREATININE 1.48*   Estimated Creatinine Clearance: 51.7 ml/min (by C-G formula based on Cr of 1.48). No results found for this basename: VANCOTROUGH:2,VANCOPEAK:2,VANCORANDOM:2,GENTTROUGH:2,GENTPEAK:2,GENTRANDOM:2,TOBRATROUGH:2,TOBRAPEAK:2,TOBRARND:2,AMIKACINPEAK:2,AMIKACINTROU:2,AMIKACIN:2, in the last 72 hours   Microbiology: No results found for this or any previous visit (from the past 720 hour(s)).  Medical History: Past Medical History  Diagnosis Date  . History of colostomy   . Urostomy stenosis     Ask patient to clarify, per history form dated 04/02/11.  Marland Kitchen Peristomal hernia     Medications:  Scheduled:    . sodium chloride   Intravenous Once  . sodium chloride   Intravenous STAT  . aspirin  81 mg Oral Daily  . buPROPion  150 mg Oral BID  . calcium & magnesium carbonates  1 tablet Oral Daily  . ceFEPime (MAXIPIME) IV  2 g Intravenous Q24H  . ciprofloxacin  400 mg Intravenous Once  . cyanocobalamin  500 mcg Oral Daily  . enoxaparin (LOVENOX) injection  40 mg Subcutaneous QHS  . gabapentin  300 mg Oral TID  .  HYDROmorphone (DILAUDID) injection  1 mg Intravenous Once  . morphine  4 mg Intravenous Once  . mulitivitamin with minerals  1 tablet Oral Daily  . ondansetron (ZOFRAN) IV  4 mg Intravenous Once  . simvastatin  20 mg Oral QHS  . sodium chloride  1,000 mL Intravenous Once  . DISCONTD: cefTRIAXone (ROCEPHIN)  IV  1 g Intravenous  Q24H  . DISCONTD: glucosamine-chondroitin  1 tablet Oral TID   Infusions:    . sodium chloride 75 mL/hr at 12/13/11 1253   PRN: acetaminophen, acetaminophen, albuterol, alum & mag hydroxide-simeth, HYDROmorphone, ondansetron (ZOFRAN) IV, ondansetron, oxyCODONE, zolpidem Assessment:  78 yo Michael Novak with pyelonephritis. Pt. Has a history of recurrent UTI with chronic antibiotic suppression therapy (most recently with cipro and macrodantin on Med Rec).   Pt. To start cefepime for anti-pseudomonal coverage  Aware of Augmentin allergy - pt. Received rocephin on admission and tolerated that fine.   Scr 1.48 (CrCl 51 ml/min)  WBC WNL   Urine clx pending  Afebrile  Goal of Therapy:  Cefepime per renal fnx  Plan:  1.) Cefepime 2 gm IV q24h 2.) Monitor Scr and f/u with urine clx  Stephane Niemann, Loma Messing 12/13/2011,1:52 PM

## 2011-12-13 NOTE — ED Notes (Signed)
Jenkins, MD at bedside.  

## 2011-12-13 NOTE — ED Provider Notes (Signed)
History     CSN: [id number]  Arrival date & time 12/13/11  0111   First MD Initiated Contact with Patient 12/13/11 0206      Chief Complaint  Patient presents with  . Flank Pain  . Emesis    (Consider location/radiation/quality/duration/timing/severity/associated sxs/prior treatment) HPI Comments: 78 year old male with a history of prostate cancer status post radiation which resulted in scarring and radiation injury to both kidneys, bladder,:. He has had resultant colostomy and urostomy of his right lower abdomen. Over the last 9 hours he has developed a pain in the right abdomen and bilateral flanks. This pain is gradual in onset, gradually getting worse, constant, severe and is associated with nausea and vomiting. He has not developed a fever or chills, rash, shortness of breath, chest pain. He does have mild headache. He has been known to have urinary infections frequently and has been admitted to the hospital in the past with severe pyelonephritis per his report  Patient is a 78 y.o. male presenting with flank pain and vomiting. The history is provided by the patient and the spouse.  Flank Pain  Emesis     Past Medical History  Diagnosis Date  . History of colostomy   . Urostomy stenosis     Ask patient to clarify, per history form dated 04/02/11.  Marland Kitchen Peristomal hernia     Past Surgical History  Procedure Date  . Cystectomy, ileal coduit, colostomy 2002    for severe rectourethral fistula  . Ex lap, lysis of adhesions 2010    for SBO  . Lap repair of parastomal hernias 2009    Dr. Barnetta [facility],  Hematoma evac POD#1    No family history on file.  History  Substance Use Topics  . Smoking status: Never Smoker   . Smokeless tobacco: Not on file  . Alcohol Use: No      Review of Systems  Gastrointestinal: Positive for vomiting.  Genitourinary: Positive for flank pain.  All other systems reviewed and are negative.    Allergies  Augmentin  Home Medications    Current Outpatient Rx  Name Route Sig Dispense Refill  . ASPIRIN 81 MG PO TABS Oral Take 81 mg by mouth daily. Ask patient to clarify dosage. Not indicated on history form dated 04/02/11.     Marland Kitchen BUPROPION HCL ER (SR) 150 MG PO TB12 Oral Take 150 mg by mouth 2 (two) times daily.      Marland Kitchen CALCIUM & MAGNESIUM CARBONATES 311-232 MG PO TABS Oral Take 1 tablet by mouth daily.      Marland Kitchen CIPROFLOXACIN HCL 500 MG PO TABS Oral Take 500 mg by mouth 2 (two) times daily.    Marland Kitchen GABAPENTIN 300 MG PO CAPS Oral Take 300 mg by mouth 3 (three) times daily.      Marland Kitchen GLUCOSAMINE-CHONDROITIN 500-400 MG PO TABS Oral Take 1 tablet by mouth 3 (three) times daily.    Marland Kitchen ONE-DAILY MULTI VITAMINS PO TABS Oral Take 1 tablet by mouth daily.      Marland Kitchen NITROFURANTOIN MACROCRYSTAL 50 MG PO CAPS Oral Take 50 mg by mouth at bedtime.    Marland Kitchen SIMVASTATIN 20 MG PO TABS Oral Take 20 mg by mouth at bedtime.      Marland Kitchen VITAMIN B-12 500 MCG PO TABS Oral Take 500 mcg by mouth daily.        BP 117/79  Pulse 65  Temp(Src) 98.9 F (37.2 C) (Oral)  Resp 20  SpO2 93%  Physical Exam  Nursing  note and vitals reviewed. Constitutional: He appears well-developed and well-nourished. No distress.  HENT:  Head: Normocephalic and atraumatic.  Mouth/Throat: No oropharyngeal exudate.       MM dry  Eyes: Conjunctivae and EOM are normal. Pupils are equal, round, and reactive to light. Right eye exhibits no discharge. Left eye exhibits no discharge. No scleral icterus.  Neck: Normal range of motion. Neck supple. No JVD present. No thyromegaly present.  Cardiovascular: Normal rate, regular rhythm, normal heart sounds and intact distal pulses.  Exam reveals no gallop and no friction rub.   No murmur heard. Pulmonary/Chest: Effort normal and breath sounds normal. No respiratory distress. He has no wheezes. He has no rales.  Abdominal: Soft. Bowel sounds are normal. He exhibits no distension and no mass. There is tenderness.       Right-sided abdominal, CVA ttp bil   Musculoskeletal: Normal range of motion. He exhibits no edema and no tenderness.  Lymphadenopathy:    He has no cervical adenopathy.  Neurological: He is alert. Coordination normal.  Skin: Skin is warm and dry. No rash noted. No erythema.  Psychiatric: He has a normal mood and affect. His behavior is normal.    ED Course  Procedures (including critical care time)  Labs Reviewed  URINALYSIS, ROUTINE W REFLEX MICROSCOPIC - Abnormal; Notable for the following:    APPearance TURBID (*)    Hgb urine dipstick LARGE (*)    Ketones, ur 40 (*)    Protein, ur >300 (*)    Nitrite POSITIVE (*)    Leukocytes, UA LARGE (*)    All other components within normal limits  CBC - Abnormal; Notable for the following:    Platelets 140 (*)    All other components within normal limits  DIFFERENTIAL - Abnormal; Notable for the following:    Neutrophils Relative 83 (*)    Lymphocytes Relative 7 (*)    Lymphs Abs 0.6 (*)    All other components within normal limits  BASIC METABOLIC PANEL - Abnormal; Notable for the following:    Sodium 132 (*)    Glucose, Bld 168 (*)    BUN 30 (*)    Creatinine, Ser 1.48 (*)    GFR calc non Af Amer 43 (*)    GFR calc Af Amer 50 (*)    All other components within normal limits  URINE MICROSCOPIC-ADD ON - Abnormal; Notable for the following:    Squamous Epithelial / LPF FEW (*)    Bacteria, UA MANY (*)    All other components within normal limits  LACTIC ACID, PLASMA  URINE CULTURE   No results found.   1. Pyelonephritis       MDM  Possible recurrent urinary infection. Urine appears cloudy, according to family member he does look like milk with coffee unit.  Patient has stable vital signs but appears very uncomfortable. Labs, urinalysis, culture, antibiotics anticipated. Intravenous morphine and Zofran given  Patient has ongoing pain, no tachycardia or fever. Blood pressure is: Stable. He does have significant pain in his abdomen and his right flank  greater than left flank. Urine sample shows urinary tract infection, culture sent, ketones, well-hydrated, antibiotics and admit.      Vida Roller, MD 12/13/11 [phone]

## 2011-12-13 NOTE — ED Notes (Signed)
Called to give report nurse unavailable will call back.  

## 2011-12-13 NOTE — ED Notes (Signed)
Room air O2 sat 89%. Patient placed on O2 at 2 liters/min via [place].

## 2011-12-13 NOTE — ED Notes (Signed)
Patient vomited small amount of liquid, green emesis.

## 2011-12-13 NOTE — Progress Notes (Signed)
PATIENT DETAILS Name: Michael Novak Age: 78 y.o. Sex: male Date of Birth: Aug 27, 1930 Admit Date: 12/13/2011 [id number], Lenon Curt, MD, MD  Subjective: Feels better, bilateral flank pain has almost resolved. Still somewhat nauseous. Afebrile so far.  Objective: Vital signs in last 24 hours: Filed Vitals:   12/13/11 0111 12/13/11 0121 12/13/11 0424 12/13/11 0821  BP: 142/92 138/70 117/79 128/76  Pulse: 62 58 65 60  Temp:  98.5 F (36.9 C) 98.9 F (37.2 C) 97.4 F (36.3 C)  TempSrc:  Oral Oral Oral  Resp: 20 22 20    SpO2:  94% 93% 93%    Weight change:   There is no height or weight on file to calculate BMI.  Intake/Output from previous day: No intake or output data in the 24 hours ending 12/13/11 1140  PHYSICAL EXAM: Gen Exam: Awake and alert with clear speech.   Neck: Supple, No JVD.   Chest: B/L Clear.   CVS: S1 S2 Regular, no murmurs.  Abdomen: soft, BS +, non tender, non distended. Urostomy and colostomy in place. Extremities: no edema, lower extremities warm to touch. Neurologic: Non Focal.   Skin: No Rash.   Wounds: N/A.    CONSULTS:  none  LAB RESULTS: CBC  Lab 12/13/11 0253  WBC 8.4  HGB 15.4  HCT 43.4  PLT 140*  MCV 88.9  MCH 31.6  MCHC 35.5  RDW 13.5  LYMPHSABS 0.6*  MONOABS 0.9  EOSABS 0.0  BASOSABS 0.0  BANDABS --    Chemistries   Lab 12/13/11 0253  NA 132*  K 4.5  CL 98  CO2 21  GLUCOSE 168*  BUN 30*  CREATININE 1.48*  CALCIUM 9.5  MG --    GFR The CrCl is unknown because both a height and weight (above a minimum accepted value) are required for this calculation.  Coagulation profile No results found for this basename: INR:5,PROTIME:5 in the last 168 hours  Cardiac Enzymes No results found for this basename: CK:3,CKMB:3,TROPONINI:3,MYOGLOBIN:3 in the last 168 hours  No components found with this basename: POCBNP:3 No results found for this basename: DDIMER:2 in the last 72 hours No results found for this basename:  HGBA1C:2 in the last 72 hours No results found for this basename: CHOL:2,HDL:2,LDLCALC:2,TRIG:2,CHOLHDL:2,LDLDIRECT:2 in the last 72 hours No results found for this basename: TSH,T4TOTAL,FREET3,T3FREE,THYROIDAB in the last 72 hours No results found for this basename: VITAMINB12:2,FOLATE:2,FERRITIN:2,TIBC:2,IRON:2,RETICCTPCT:2 in the last 72 hours No results found for this basename: LIPASE:2,AMYLASE:2 in the last 72 hours  Urine Studies No results found for this basename: UACOL:2,UAPR:2,USPG:2,UPH:2,UTP:2,UGL:2,UKET:2,UBIL:2,UHGB:2,UNIT:2,UROB:2,ULEU:2,UEPI:2,UWBC:2,URBC:2,UBAC:2,CAST:2,CRYS:2,UCOM:2,BILUA:2 in the last 72 hours  MICROBIOLOGY: No results found for this or any previous visit (from the past 240 hour(s)).  RADIOLOGY STUDIES/RESULTS: No results found.  MEDICATIONS: Scheduled Meds:   . sodium chloride   Intravenous Once  . sodium chloride   Intravenous STAT  . aspirin  81 mg Oral Daily  . buPROPion  150 mg Oral BID  . calcium & magnesium carbonates  1 tablet Oral Daily  . ciprofloxacin  400 mg Intravenous Once  . cyanocobalamin  500 mcg Oral Daily  . gabapentin  300 mg Oral TID  .  HYDROmorphone (DILAUDID) injection  1 mg Intravenous Once  . morphine  4 mg Intravenous Once  . mulitivitamin with minerals  1 tablet Oral Daily  . ondansetron (ZOFRAN) IV  4 mg Intravenous Once  . simvastatin  20 mg Oral QHS  . sodium chloride  1,000 mL Intravenous Once  . DISCONTD: cefTRIAXone (ROCEPHIN)  IV  1 g Intravenous Q24H  . DISCONTD: glucosamine-chondroitin  1 tablet Oral TID   Continuous Infusions:   . sodium chloride 75 mL/hr at 12/13/11 0952   PRN Meds:.acetaminophen, acetaminophen, alum & mag hydroxide-simeth, HYDROmorphone, ondansetron (ZOFRAN) IV, ondansetron, oxyCODONE, zolpidem  Antibiotics: Anti-infectives     Start     Dose/Rate Route Frequency Ordered Stop   12/13/11 0630   cefTRIAXone (ROCEPHIN) 1 g in dextrose 5 % 50 mL IVPB  Status:  Discontinued        1  g 100 mL/hr over 30 Minutes Intravenous Every 24 hours 12/13/11 0630 12/13/11 1139   12/13/11 0415   ciprofloxacin (CIPRO) IVPB 400 mg        400 mg 200 mL/hr over 60 Minutes Intravenous  Once 12/13/11 0409 12/13/11 0526          Assessment/Plan: Patient Active Hospital Problem List:  #1 pyelonephritis -Symptomatically better, given history of chronic antibiotic suppression and history of recurrent urinary tract infection, will broaden antibiotic coverage to cefepime to cover for Pseudomonas. -Await urine culture results. We'll get a renal ultrasound as well.  #2 dyslipidemia -Continue with statin, check lipids as outpatient.  #3 mild renal insufficiency -Continue with IV fluids, suspect this is from #1 -Check renal ultrasound.  #4 history of prostate cancer-status post prostatectomy with ileal conduit and colostomy -Follows up with urologist-Dr. Logan Bores in [place] -This seems stable, as noted above patient is on antibiotics and will get a renal ultrasound, if it shows any gross abnormality then we'll consult urology, otherwise further followup will be done as an outpatient with primary urologist  #5 history of COPD -Lungs are clear, will order as needed nebulizers  Disposition: Remaining patient, await urine culture results  DVT Prophylaxis: Subcutaneous Lovenox  Code Status: Full code  Maretta Bees,   MD. 12/13/2011, 11:40 AM

## 2011-12-13 NOTE — Progress Notes (Signed)
PHARMACIST - PHYSICIAN ORDER COMMUNICATION  CONCERNING: P&T Medication Policy on Herbal Medications  DESCRIPTION:  This patient's order for:  Glucosamine-chondroitin  has been noted.  This product(s) is classified as an "herbal" or natural product. Due to a lack of definitive safety studies or FDA approval, nonstandard manufacturing practices, plus the potential risk of unknown drug-drug interactions while on inpatient medications, the Pharmacy and Therapeutics Committee does not permit the use of "herbal" or natural products of this type within [place].   ACTION TAKEN: The pharmacy department is unable to verify this order at this time and your patient has been informed of this safety policy. Please reevaluate patient's clinical condition at discharge and address if the herbal or natural product(s) should be resumed at that time.   

## 2011-12-13 NOTE — ED Notes (Signed)
Per EMS: pt from home, pt c/o flank pain/ 10/10, states started about 1900 last night, intense sharp pain "feels like someone is punching me in the kidnies", pt also reports having frequent kidney infections,pt with ostomy presented.

## 2011-12-13 NOTE — ED Notes (Signed)
[id number]<GN> Expected date:12/13/11<BR> Expected time:12:54 AM<BR> Means of arrival:Ambulance<BR> Comments:<BR> EMS 15 GC, 81 yom kidney pain

## 2011-12-13 NOTE — H&P (Addendum)
DATE OF ADMISSION:  12/13/2011  PCP:    Kimber Relic, MD, MD   Chief Complaint: Back Pain, Nausea and Vomiting   HPI: Michael Novak is an 78 y.o. male who presents with complaints of severe back pain in the kidney areas that started 1 day ago.  He reports taking 2 doses of Cipro that he had but the pain continued to worsen.  He had chills and nausea and dry heaves.  He asked his wife to call the ambulance to take him to the hospital.  He has a colostomy and a urostomy and is on prophylactic nitrofurantoin.  He has a history of recurrent Urinary Tract Infections.     Past Medical History  Diagnosis Date  . History of colostomy   . Urostomy stenosis     Ask patient to clarify, per history form dated 04/02/11.  Marland Kitchen Peristomal hernia        History of Prostate Cancer in 2001      Hyperlipidemia    Past Surgical History  Procedure Date  . Cystectomy, ileal coduit, colostomy 2002    for severe rectourethral fistula  . Ex lap, lysis of adhesions 2010    for SBO  . Lap repair of parastomal hernias 2009    Dr. Barnetta Chapel [facility],  Hematoma evac POD#1        Clavicular Separation Repair  Medications:  HOME MEDS: Prior to Admission medications   Medication Sig Start Date End Date Taking? Authorizing Provider  aspirin 81 MG tablet Take 81 mg by mouth daily. Ask patient to clarify dosage. Not indicated on history form dated 04/02/11.    Yes Historical Provider, MD  buPROPion (WELLBUTRIN SR) 150 MG 12 hr tablet Take 150 mg by mouth 2 (two) times daily.     Yes Historical Provider, MD  calcium & magnesium carbonates (MYLANTA) 311-232 MG per tablet Take 1 tablet by mouth daily.     Yes Historical Provider, MD  ciprofloxacin (CIPRO) 500 MG tablet Take 500 mg by mouth 2 (two) times daily.   Yes Historical Provider, MD  gabapentin (NEURONTIN) 300 MG capsule Take 300 mg by mouth 3 (three) times daily.     Yes Historical Provider, MD  glucosamine-chondroitin 500-400 MG tablet Take 1 tablet by mouth 3  (three) times daily.   Yes Historical Provider, MD  Multiple Vitamin (MULTIVITAMIN) tablet Take 1 tablet by mouth daily.     Yes Historical Provider, MD  nitrofurantoin (MACRODANTIN) 50 MG capsule Take 50 mg by mouth at bedtime.   Yes Historical Provider, MD  simvastatin (ZOCOR) 20 MG tablet Take 20 mg by mouth at bedtime.     Yes Historical Provider, MD  vitamin B-12 (CYANOCOBALAMIN) 500 MCG tablet Take 500 mcg by mouth daily.     Yes Historical Provider, MD    Allergies:  Allergies  Allergen Reactions  . Augmentin     Social History:   reports that he has never smoked. He does not have any smokeless tobacco history on file. He reports that he does not drink alcohol. His drug history not on file.  Family History: No family history on file.  Review of Systems:  The patient denies anorexia, fever, weight loss, vision loss, decreased hearing, hoarseness, chest pain, syncope, dyspnea on exertion, peripheral edema, balance deficits, hemoptysis, abdominal pain, melena, hematochezia, severe indigestion/heartburn, hematuria, incontinence, genital sores, suspicious skin lesions, transient blindness, difficulty walking, depression, unusual weight change, abnormal bleeding, enlarged lymph nodes, angioedema, and breast masses.   Physical  Exam:  GEN:  Pleasant 78 year old elderly Obese Caucasian male examined  and in no acute distress; cooperative with exam Filed Vitals:   12/13/11 0111 12/13/11 0121 12/13/11 0424  BP: 142/92 138/70 117/79  Pulse: 62 58 65  Temp:  98.5 F (36.9 C) 98.9 F (37.2 C)  TempSrc:  Oral Oral  Resp: 20 22 20   SpO2:  94% 93%   Blood pressure 117/79, pulse 65, temperature 98.9 F (37.2 C), temperature source Oral, resp. rate 20, SpO2 93.00%. PSYCH: He is alert and oriented x4; does not appear anxious does not appear depressed; affect is normal HEENT: Normocephalic and Atraumatic, Mucous membranes pink; PERRLA; EOM intact; Fundi:  Benign;  No scleral icterus, Nares:  Patent, Oropharynx: Clear, Neck:  FROM, no cervical lymphadenopathy nor thyromegaly or carotid bruit; no JVD; Breasts:: Not examined CHEST WALL: No tenderness CHEST: Normal respiration, clear to auscultation bilaterally HEART: Regular rate and rhythm; no murmurs rubs or gallops BACK: No kyphosis or scoliosis; no CVA tenderness ABDOMEN: Positive Bowel Sounds, Obese, soft non-tender;  Colostomy and Urostomy present,  Both stomas are pink.   no masses, no organomegaly. Rectal Exam: Not done EXTREMITIES:  No cyanosis, clubbing or edema; no ulcerations. Genitalia: not examined PULSES: 2+ and symmetric SKIN: Normal hydration no rash or ulceration CNS: Cranial nerves 2-12 grossly intact no focal neurologic deficit   Labs & Imaging Results for orders placed during the hospital encounter of 12/13/11 (from the past 48 hour(s))  URINALYSIS, ROUTINE W REFLEX MICROSCOPIC     Status: Abnormal   Collection Time   12/13/11  2:53 AM      Component Value Range Comment   Color, Urine YELLOW  YELLOW     APPearance TURBID (*) CLEAR     Specific Gravity, Urine 1.022  1.005 - 1.030     pH 6.0  5.0 - 8.0     Glucose, UA NEGATIVE  NEGATIVE (mg/dL)    Hgb urine dipstick LARGE (*) NEGATIVE     Bilirubin Urine NEGATIVE  NEGATIVE     Ketones, ur 40 (*) NEGATIVE (mg/dL)    Protein, ur >213 (*) NEGATIVE (mg/dL)    Urobilinogen, UA 0.2  0.0 - 1.0 (mg/dL)    Nitrite POSITIVE (*) NEGATIVE     Leukocytes, UA LARGE (*) NEGATIVE    CBC     Status: Abnormal   Collection Time   12/13/11  2:53 AM      Component Value Range Comment   WBC 8.4  4.0 - 10.5 (K/uL)    RBC 4.88  4.22 - 5.81 (MIL/uL)    Hemoglobin 15.4  13.0 - 17.0 (g/dL)    HCT 08.6  57.8 - 46.9 (%)    MCV 88.9  78.0 - 100.0 (fL)    MCH 31.6  26.0 - 34.0 (pg)    MCHC 35.5  30.0 - 36.0 (g/dL)    RDW 62.9  52.8 - 41.3 (%)    Platelets 140 (*) 150 - 400 (K/uL)   DIFFERENTIAL     Status: Abnormal   Collection Time   12/13/11  2:53 AM      Component Value  Range Comment   Neutrophils Relative 83 (*) 43 - 77 (%)    Neutro Abs 7.0  1.7 - 7.7 (K/uL)    Lymphocytes Relative 7 (*) 12 - 46 (%)    Lymphs Abs 0.6 (*) 0.7 - 4.0 (K/uL)    Monocytes Relative 11  3 - 12 (%)    Monocytes  Absolute 0.9  0.1 - 1.0 (K/uL)    Eosinophils Relative 0  0 - 5 (%)    Eosinophils Absolute 0.0  0.0 - 0.7 (K/uL)    Basophils Relative 0  0 - 1 (%)    Basophils Absolute 0.0  0.0 - 0.1 (K/uL)   BASIC METABOLIC PANEL     Status: Abnormal   Collection Time   12/13/11  2:53 AM      Component Value Range Comment   Sodium 132 (*) 135 - 145 (mEq/L)    Potassium 4.5  3.5 - 5.1 (mEq/L)    Chloride 98  96 - 112 (mEq/L)    CO2 21  19 - 32 (mEq/L)    Glucose, Bld 168 (*) 70 - 99 (mg/dL)    BUN 30 (*) 6 - 23 (mg/dL)    Creatinine, Ser 1.61 (*) 0.50 - 1.35 (mg/dL)    Calcium 9.5  8.4 - 10.5 (mg/dL)    GFR calc non Af Amer 43 (*) >90 (mL/min)    GFR calc Af Amer 50 (*) >90 (mL/min)   LACTIC ACID, PLASMA     Status: Normal   Collection Time   12/13/11  2:53 AM      Component Value Range Comment   Lactic Acid, Venous 1.5  0.5 - 2.2 (mmol/L)   URINE MICROSCOPIC-ADD ON     Status: Abnormal   Collection Time   12/13/11  2:53 AM      Component Value Range Comment   Squamous Epithelial / LPF FEW (*) RARE     WBC, UA TOO NUMEROUS TO COUNT  <3 (WBC/hpf)    RBC / HPF TOO NUMEROUS TO COUNT  <3 (RBC/hpf)    Bacteria, UA MANY (*) RARE     No results found.    Assessment/Plan:  1.  Pyelonephritis 2.  SOB 3.  Hypoxemia 4.  Nausea and Vomiting 5.  ARF 6.  Mild Hyponatremia   Plan:    Admit, Urine sent for Culture and IV Cipro initially given, and changed to IV Rocephin. Supplemental oxygen has been ordered PRN.   IVFs ordered for rehydration, and electrolytes will be monitored and repleted as needed.  Antiemetic and Pain control therapy have been ordered, and SCDs have been ordered for DVT prophylaxis.  And, other plans as per orders.    CODE STATUS:      FULL CODE          Briel Gallicchio C 12/13/2011, 5:45 AM

## 2011-12-14 DIAGNOSIS — E860 Dehydration: Secondary | ICD-10-CM | POA: Diagnosis present

## 2011-12-14 DIAGNOSIS — E785 Hyperlipidemia, unspecified: Secondary | ICD-10-CM | POA: Diagnosis present

## 2011-12-14 DIAGNOSIS — N12 Tubulo-interstitial nephritis, not specified as acute or chronic: Secondary | ICD-10-CM | POA: Diagnosis present

## 2011-12-14 DIAGNOSIS — E871 Hypo-osmolality and hyponatremia: Secondary | ICD-10-CM | POA: Diagnosis present

## 2011-12-14 DIAGNOSIS — N19 Unspecified kidney failure: Secondary | ICD-10-CM | POA: Diagnosis present

## 2011-12-14 HISTORY — DX: Hyperlipidemia, unspecified: E78.5

## 2011-12-14 LAB — BASIC METABOLIC PANEL
Calcium: 8.7 mg/dL (ref 8.4–10.5)
GFR calc Af Amer: 35 mL/min — ABNORMAL LOW (ref >90)
GFR calc non Af Amer: 30 mL/min — ABNORMAL LOW (ref >90)
Sodium: 136 mEq/L (ref 135–145)

## 2011-12-14 LAB — CBC
Platelets: 125 10*3/uL — ABNORMAL LOW (ref 150–400)
RBC: 4.34 MIL/uL (ref 4.22–5.81)
WBC: 7.7 10*3/uL (ref 4.0–10.5)

## 2011-12-14 MED ORDER — VANCOMYCIN HCL 1000 MG IV SOLR
1500.0000 mg | INTRAVENOUS | Status: DC
  Administered 2011-12-14 – 2011-12-16 (×3): 1500 mg via INTRAVENOUS
  Filled 2011-12-14 (×5): qty 1500

## 2011-12-14 MED ORDER — GUAIFENESIN 100 MG/5ML PO SOLN
5.0000 mL | ORAL | Status: DC | PRN
  Administered 2011-12-14 – 2011-12-17 (×4): 100 mg via ORAL
  Filled 2011-12-14 (×4): qty 10

## 2011-12-14 NOTE — Progress Notes (Signed)
CARE MANAGEMENT NOTE 12/14/2011  Patient:  BRANSYN, ADAMI   Account Number:  [id number]  Date Initiated:  12/14/2011  Documentation initiated by:  DAVIS,RHONDA  Subjective/Objective Assessment:   pt with fever, abd pain,     Action/Plan:   lives at home with spouse   Anticipated DC Date:  12/17/2011   Anticipated DC Plan:  HOME/SELF CARE  In-house referral  NA      DC Planning Services  NA      [facility] Choice  NA   Choice offered to / List presented to:  NA   DME arranged  NA      DME agency  NA     HH arranged  NA      HH agency  NA   Status of service:  In process, will continue to follow Medicare Important Message given?  YES (If response is "NO", the following Medicare IM given date fields will be blank) Date Medicare IM given:  12/13/2011 Date Additional Medicare IM given:    Discharge Disposition:    Per UR Regulation:  Reviewed for med. necessity/level of care/duration of stay  Comments:  [id number]/Rhonda Davis,RN,BSN,CCM

## 2011-12-14 NOTE — Progress Notes (Signed)
Subjective: Pt states that he feels better than he did yesterday but still not back to normal. Objective: Filed Vitals:   12/14/11 0535 12/14/11 0945 12/14/11 1325 12/14/11 1805  BP: 115/78 115/68 129/67 134/72  Pulse: 98 74 67 71  Temp: 100 F (37.8 C) 98.7 F (37.1 C) 98.6 F (37 C) 100.6 F (38.1 C)  TempSrc: Oral Oral Oral Oral  Resp: 19 20 18 16   Height:      Weight:      SpO2: 92% 93% 95% 93%   Weight change:   Intake/Output Summary (Last 24 hours) at 12/14/11 1928 Last data filed at 12/14/11 1800  Gross per 24 hour  Intake   3410 ml  Output   1500 ml  Net   1910 ml    General: Alert, awake, oriented x3, in no acute distress.  HEENT: [place]/AT PEERL, EOMI Neck: Trachea midline,  no masses, no thyromegal,y no JVD, no carotid bruit OROPHARYNX:  Moist, No exudate/ erythema/lesions.  Heart: Regular rate and rhythm, without murmurs, rubs, gallops Lungs: Clear to auscultation, no wheezing or rhonchi noted.  Abdomen: Soft, nontender, nondistended, positive bowel sounds. Ostomy sites without signs of infection. Neuro: No focal neurological deficits noted cranial nerves II through XII grossly intact. DTRs 2+ bilaterally upper and lower extremities. Strength functional in bilateral upper and lower extremities. Musculoskeletal: No warm swelling or erythema around joints, no spinal tenderness noted. Psychiatric: Patient alert and oriented x3, good insight and cognition, good recent to remote recall. Lymph node survey: No cervical axillary or inguinal lymphadenopathy noted.     Lab Results:  Basename 12/14/11 0330 12/13/11 0253  NA 136 132*  K 3.8 4.5  CL 99 98  CO2 27 21  GLUCOSE 107* 168*  BUN 31* 30*  CREATININE 1.99* 1.48*  CALCIUM 8.7 9.5  MG -- --  PHOS -- --   No results found for this basename: AST:2,ALT:2,ALKPHOS:2,BILITOT:2,PROT:2,ALBUMIN:2 in the last 72 hours No results found for this basename: LIPASE:2,AMYLASE:2 in the last 72 hours  Basename 12/14/11  0330 12/13/11 0253  WBC 7.7 8.4  NEUTROABS -- 7.0  HGB 13.6 15.4  HCT 39.7 43.4  MCV 91.5 88.9  PLT 125* 140*   No results found for this basename: CKTOTAL:3,CKMB:3,CKMBINDEX:3,TROPONINI:3 in the last 72 hours No components found with this basename: POCBNP:3 No results found for this basename: DDIMER:2 in the last 72 hours No results found for this basename: HGBA1C:2 in the last 72 hours No results found for this basename: CHOL:2,HDL:2,LDLCALC:2,TRIG:2,CHOLHDL:2,LDLDIRECT:2 in the last 72 hours No results found for this basename: TSH,T4TOTAL,FREET3,T3FREE,THYROIDAB in the last 72 hours No results found for this basename: VITAMINB12:2,FOLATE:2,FERRITIN:2,TIBC:2,IRON:2,RETICCTPCT:2 in the last 72 hours  Micro Results: Recent Results (from the past 240 hour(s))  URINE CULTURE     Status: Normal (Preliminary result)   Collection Time   12/13/11  2:53 AM      Component Value Range Status Comment   Specimen Description URINE, CATHETERIZED   Final    Special Requests Normal   Final    Setup Time [id number]   Final    Colony Count PENDING   Incomplete    Culture Culture reincubated for better growth   Final    Report Status PENDING   Incomplete     Studies/Results: US Renal  12/13/2011  *RADIOLOGY REPORT*  Clinical Data: Pyelonephritis.  Ureterostomy.  RENAL/URINARY TRACT ULTRASOUND COMPLETE  Comparison:  Ultrasound dated 11/04/2011 and CT scan dated 06/14/2011  Findings:  Right Kidney:  13 cm in length.  Moderate right hydronephrosis, unchanged.  No new cortical lesions.  Left Kidney:  12.7 cm in length.  Mild left hydronephrosis, slightly more prominent than on the prior study.  No cortical lesions.  Bladder:  Removed.  IMPRESSION: Bilateral hydronephrosis, minimally progressed on the left.  No ultrasound evidence of pyelonephritis.  Original Report Authenticated By: Gwynn Burly, M.D.    Medications: I have reviewed the patient's current medications. Scheduled Meds:   . aspirin   81 mg Oral Daily  . buPROPion  150 mg Oral BID  . calcium & magnesium carbonates  1 tablet Oral Daily  . ceFEPime (MAXIPIME) IV  2 g Intravenous Q24H  . cyanocobalamin  500 mcg Oral Daily  . enoxaparin (LOVENOX) injection  40 mg Subcutaneous QHS  . gabapentin  300 mg Oral TID  . mulitivitamin with minerals  1 tablet Oral Daily  . simvastatin  20 mg Oral QHS   Continuous Infusions:   . sodium chloride 75 mL/hr at 12/14/11 1800   PRN Meds:.acetaminophen, acetaminophen, albuterol, alum & mag hydroxide-simeth, HYDROmorphone, ondansetron (ZOFRAN) IV, ondansetron, oxyCODONE, zolpidem Assessment/Plan: Patient Active Hospital Problem List:  Pyelonephritis (12/14/2011)   Assessment: Urine culture shows Enterococcus. Will add vancomycin and narrow spectrum when cultures back.   Dyslipidemia (12/14/2011)   Assessment: Follow up as out patient   Renal failure (12/14/2011)   Assessment: patient has a baseline Cr of 1.96 six months ago. Thus Cr. is at baseline.   Dehydration (12/14/2011)   Assessment: Pt's urine still concentration will increase fluids.    Hyponatremia (12/14/2011)   Assessment: inproved      LOS: 1 day

## 2011-12-14 NOTE — Progress Notes (Signed)
ANTIBIOTIC CONSULT NOTE - INITIAL  Pharmacy Consult for Vanco Indication: Enterococcal pyelonephritis  Allergies  Allergen Reactions  . Augmentin     Patient Measurements: Height: 6\' 2"  (188 cm) Weight: 226 lb 13.7 oz (102.9 kg) IBW/kg (Calculated) : 82.2    Vital Signs: Temp: 100.6 F (38.1 C) (01/15 1805) Temp src: Oral (01/15 1805) BP: 134/72 mmHg (01/15 1805) Pulse Rate: 71  (01/15 1805) Intake/Output from previous day: 01/14 0701 - 01/15 0700 In: 180 [P.O.:180] Out: 600 [Urine:600] Intake/Output from this shift:    Labs:  Basename 12/14/11 0330 12/13/11 0253  WBC 7.7 8.4  HGB 13.6 15.4  PLT 125* 140*  LABCREA -- --  CREATININE 1.99* 1.48*   Estimated Creatinine Clearance: 38.5 ml/min (by C-G formula based on Cr of 1.99). CrCl ~ 72ml/min (normalized)  No results found for this basename: VANCOTROUGH:2,VANCOPEAK:2,VANCORANDOM:2,GENTTROUGH:2,GENTPEAK:2,GENTRANDOM:2,TOBRATROUGH:2,TOBRAPEAK:2,TOBRARND:2,AMIKACINPEAK:2,AMIKACINTROU:2,AMIKACIN:2, in the last 72 hours   Microbiology: Recent Results (from the past 720 hour(s))  URINE CULTURE     Status: Normal (Preliminary result)   Collection Time   12/13/11  2:53 AM      Component Value Range Status Comment   Specimen Description URINE, CATHETERIZED   Final    Special Requests Normal   Final    Setup Time [id number]   Final    Colony Count >=100,000 COLONIES/ML   Final    Culture ENTEROCOCCUS SPECIES   Final    Report Status PENDING   Incomplete     Medical History: Past Medical History  Diagnosis Date  . History of colostomy   . Urostomy stenosis     Ask patient to clarify, per history form dated 04/02/11.  Marland Kitchen Peristomal hernia   . Prostate cancer   . Skin cancer     Medications:  Scheduled:     . aspirin  81 mg Oral Daily  . buPROPion  150 mg Oral BID  . calcium & magnesium carbonates  1 tablet Oral Daily  . ceFEPime (MAXIPIME) IV  2 g Intravenous Q24H  . cyanocobalamin  500 mcg Oral Daily  .  enoxaparin (LOVENOX) injection  40 mg Subcutaneous QHS  . gabapentin  300 mg Oral TID  . mulitivitamin with minerals  1 tablet Oral Daily  . simvastatin  20 mg Oral QHS   Infusions:     . sodium chloride 75 mL/hr at 12/14/11 1800   PRN: acetaminophen, acetaminophen, albuterol, alum & mag hydroxide-simeth, HYDROmorphone, ondansetron (ZOFRAN) IV, ondansetron, oxyCODONE, zolpidem  Assessment: 78 yo M with pyelonephritis. Pt. has a history of recurrent UTI with chronic antibiotic suppression therapy (most recently with cipro and macrodantin on Med Rec). Started on Cefepime on 12/13/11, now to start on Vanco for Enterococcus spp. growing in urine.  Goal of Therapy:  Vanco trough 10-15  Plan:  1) Vanco 1500 mg IV q24h 2) F/u with urine clx  Thomasena Edis, Cathlean Cower 12/14/2011,8:03 PM

## 2011-12-15 MED ORDER — ALUM & MAG HYDROXIDE-SIMETH 200-200-20 MG/5ML PO SUSP
15.0000 mL | Freq: Every day | ORAL | Status: DC
  Administered 2011-12-16 – 2011-12-17 (×2): 15 mL via ORAL
  Filled 2011-12-15: qty 30

## 2011-12-15 MED ORDER — ENOXAPARIN SODIUM 30 MG/0.3ML ~~LOC~~ SOLN
30.0000 mg | Freq: Every day | SUBCUTANEOUS | Status: DC
  Filled 2011-12-15: qty 0.3

## 2011-12-15 NOTE — Progress Notes (Signed)
Michael Novak is a pleasant  78 y.o. male patient admitted with flank pain, found to have enterococcus uti. I have reviewed his chart, seen and examined him at bed side. He denies any complaints but had temp100.56F last night.    1. Pyelonephritis     Past Medical History  Diagnosis Date  . History of colostomy   . Urostomy stenosis     Ask patient to clarify, per history form dated 04/02/11.  Marland Kitchen Peristomal hernia   . Prostate cancer   . Skin cancer    Current Facility-Administered Medications  Medication Dose Route Frequency Provider Last Rate Last Dose  . 0.9 %  sodium chloride infusion   Intravenous Continuous Michael Heidt, MD 75 mL/hr at 12/15/11 1318    . acetaminophen (TYLENOL) tablet 650 mg  650 mg Oral Q6H PRN Michael Parker, MD   650 mg at 12/15/11 1610   Or  . acetaminophen (TYLENOL) suppository 650 mg  650 mg Rectal Q6H PRN Michael Velora Heckler, MD      . albuterol (PROVENTIL) (5 MG/ML) 0.5% nebulizer solution 2.5 mg  2.5 mg Nebulization Q2H PRN Michael Massed, MD      . alum & mag hydroxide-simeth (MAALOX/MYLANTA) 200-200-20 MG/5ML suspension 15 mL  15 mL Oral Daily Michael Novak, Michael Novak      . alum & mag hydroxide-simeth (MAALOX/MYLANTA) 200-200-20 MG/5ML suspension 30 mL  30 mL Oral Q6H PRN Michael Parker, MD   30 mL at 12/13/11 2307  . aspirin chewable tablet 81 mg  81 mg Oral Daily Michael Parker, MD   81 mg at 12/15/11 1048  . buPROPion (WELLBUTRIN SR) 12 hr tablet 150 mg  150 mg Oral BID Michael Parker, MD   150 mg at 12/15/11 1048  . cyanocobalamin tablet 500 mcg  500 mcg Oral Daily Michael Parker, MD   500 mcg at 12/15/11 1048  . gabapentin (NEURONTIN) capsule 300 mg  300 mg Oral TID Michael Parker, MD   300 mg at 12/15/11 1537  . guaiFENesin (ROBITUSSIN) 100 MG/5ML solution 100 mg  5 mL Oral Q4H PRN Michael A. Ashley Royalty, MD   100 mg at 12/15/11 1537  . HYDROmorphone (DILAUDID) injection 0.5-1 mg  0.5-1 mg Intravenous Q3H PRN Michael Parker, MD   1 mg at 12/13/11 1938  . mulitivitamin with minerals tablet 1 tablet  1 tablet Oral Daily Michael Parker, MD   1 tablet at 12/15/11 1048  . ondansetron (ZOFRAN) tablet 4 mg  4 mg Oral Q6H PRN Michael Parker, MD       Or  . ondansetron [facility]) injection 4 mg  4 mg Intravenous Q6H PRN Michael Parker, MD   4 mg at 12/13/11 2129  . oxyCODONE (Oxy IR/ROXICODONE) immediate release tablet 5 mg  5 mg Oral Q4H PRN Michael Parker, MD      . simvastatin (ZOCOR) tablet 20 mg  20 mg Oral QHS Michael Parker, MD   20 mg at 12/14/11 2253  . vancomycin (VANCOCIN) 1,500 mg in sodium chloride 0.9 % 500 mL IVPB  1,500 mg Intravenous Q24H Michael Novak, Michael Novak   1,500 mg at 12/14/11 2251  . zolpidem (AMBIEN) tablet 5 mg  5 mg Oral QHS PRN Michael Parker, MD      . DISCONTD: calcium & magnesium carbonates (MYLANTA) 960-454 MG per tablet 1 tablet  1 tablet Oral Daily Michael Parker, MD   1 tablet at  12/14/11 1000  . DISCONTD: ceFEPIme (MAXIPIME) 2 g in dextrose 5 % 50 mL IVPB  2 g Intravenous Q24H Michael Massed, MD   2 g at 12/15/11 1434  . DISCONTD: enoxaparin (LOVENOX) injection 30 mg  30 mg Subcutaneous QHS Michael Bugaj, MD      . DISCONTD: enoxaparin (LOVENOX) injection 40 mg  40 mg Subcutaneous QHS Michael Massed, MD   40 mg at 12/14/11 2252   Allergies  Allergen Reactions  . Augmentin    Active Problems:  Pyelonephritis  Dyslipidemia  Renal failure  Dehydration  Hyponatremia   Vital signs in last 24 hours: Temp:  [97.9 F (36.6 C)-100.6 F (38.1 C)] 97.9 F (36.6 C) (01/16 1315) Pulse Rate:  [57-82] 57  (01/16 1315) Resp:  [16-20] 18  (01/16 1315) BP: (117-134)/(68-81) 122/70 mmHg (01/16 1315) SpO2:  [91 %-94 %] 93 % (01/16 1315) Weight change:  Last BM Date: 12/14/11  Intake/Output from previous day: 01/15 0701 - 01/16 0700 In: 3230 [P.O.:440; I.V.:2690; IV Piggyback:100] Out: 4300 [Urine:4300] Intake/Output this shift: Total I/O In:  1180 [P.O.:480; I.V.:600; IV Piggyback:100] Out: 600 [Urine:600]  Lab Results:  [facility] 12/14/11 0330 12/13/11 0253  WBC 7.7 8.4  HGB 13.6 15.4  HCT 39.7 43.4  PLT 125* 140*   BMET  Basename 12/14/11 0330 12/13/11 0253  NA 136 132*  K 3.8 4.5  CL 99 98  CO2 27 21  GLUCOSE 107* 168*  BUN 31* 30*  CREATININE 1.99* 1.48*  CALCIUM 8.7 9.5    Studies/Results: No results found.  Medications: I have reviewed the patient's current medications.   Physical exam GENERAL- alert HEAD- normal atraumatic, no neck masses, normal thyroid, no jvd RESPIRATORY- appears well, vitals normal, no respiratory distress, acyanotic, normal RR, ear and throat exam is normal, neck free of mass or lymphadenopathy, chest clear, no wheezing, crepitations, rhonchi, normal symmetric air entry CVS- regular rate and rhythm, S1, S2 normal, no murmur, click, rub or gallop ABDOMEN-colostomy and urostomy bags in place. Non tender. +BS. NEURO- Grossly normal EXTREMITIES- extremities normal, atraumatic, no cyanosis or edema  Plan  1. Enterococcus Pyelonephritis- sensitivity reviewed. Continue vanc for now. ?slight increase in hydronephrosis left side. Needs 7 days of vanc/ampicillin/linezolid.  2. Acute on chronic ckd stage 3- wonder hiow much is due to hydronephrosis. Monitor with ivf.  3. Thrombocytopenia- new. ? meds related versus infection versus hit. D/c lovenox. Follow level in am.  4. Dyslipidemia     Michael Novak 12/15/2011 4:25 PM Pager: [phone].

## 2011-12-16 LAB — CBC
HCT: 34.7 % — ABNORMAL LOW (ref 39.0–52.0)
Hemoglobin: 11.7 g/dL — ABNORMAL LOW (ref 13.0–17.0)
WBC: 5.5 10*3/uL (ref 4.0–10.5)

## 2011-12-16 LAB — COMPREHENSIVE METABOLIC PANEL
ALT: 15 U/L (ref 0–53)
Albumin: 2.8 g/dL — ABNORMAL LOW (ref 3.5–5.2)
Alkaline Phosphatase: 37 U/L — ABNORMAL LOW (ref 39–117)
BUN: 23 mg/dL (ref 6–23)
Chloride: 104 mEq/L (ref 96–112)
GFR calc Af Amer: 58 mL/min — ABNORMAL LOW (ref >90)
Glucose, Bld: 103 mg/dL — ABNORMAL HIGH (ref 70–99)
Potassium: 3.9 mEq/L (ref 3.5–5.1)
Total Bilirubin: 0.3 mg/dL (ref 0.3–1.2)

## 2011-12-16 LAB — PROTIME-INR: Prothrombin Time: 14.9 seconds (ref 11.6–15.2)

## 2011-12-16 MED ORDER — PREDNISONE 20 MG PO TABS
40.0000 mg | ORAL_TABLET | Freq: Every day | ORAL | Status: DC
  Administered 2011-12-17: 40 mg via ORAL
  Filled 2011-12-16: qty 2

## 2011-12-16 MED ORDER — PREDNISONE 20 MG PO TABS
40.0000 mg | ORAL_TABLET | Freq: Once | ORAL | Status: AC
  Administered 2011-12-16: 40 mg via ORAL
  Filled 2011-12-16: qty 2

## 2011-12-16 MED ORDER — POLYETHYLENE GLYCOL 3350 17 G PO PACK
17.0000 g | PACK | Freq: Every day | ORAL | Status: DC
  Administered 2011-12-16 – 2011-12-17 (×2): 17 g via ORAL
  Filled 2011-12-16 (×3): qty 1

## 2011-12-16 MED ORDER — PREDNISONE 20 MG PO TABS
20.0000 mg | ORAL_TABLET | Freq: Once | ORAL | Status: AC
Start: 2011-12-16 — End: 2011-12-16
  Administered 2011-12-16: 20 mg via ORAL
  Filled 2011-12-16: qty 1

## 2011-12-16 NOTE — Progress Notes (Signed)
SUBJECTIVE Complaints of pain and swelling of big toes, consistent with previous gout attacks.   1. Pyelonephritis     Past Medical History  Diagnosis Date  . History of colostomy   . Urostomy stenosis     Ask patient to clarify, per history form dated 04/02/11.  Marland Kitchen Peristomal hernia   . Prostate cancer   . Skin cancer    Current Facility-Administered Medications  Medication Dose Route Frequency Provider Last Rate Last Dose  . 0.9 %  sodium chloride infusion   Intravenous Continuous Laure Leone, MD 40 mL/hr at 12/16/11 1440    . acetaminophen (TYLENOL) tablet 650 mg  650 mg Oral Q6H PRN Ron Parker, MD   650 mg at 12/15/11 2204   Or  . acetaminophen (TYLENOL) suppository 650 mg  650 mg Rectal Q6H PRN Harvette Velora Heckler, MD      . albuterol (PROVENTIL) (5 MG/ML) 0.5% nebulizer solution 2.5 mg  2.5 mg Nebulization Q2H PRN Jeoffrey Massed, MD      . alum & mag hydroxide-simeth (MAALOX/MYLANTA) 200-200-20 MG/5ML suspension 15 mL  15 mL Oral Daily Elvira Chika Madueme, PHARMD   15 mL at 12/16/11 1057  . alum & mag hydroxide-simeth (MAALOX/MYLANTA) 200-200-20 MG/5ML suspension 30 mL  30 mL Oral Q6H PRN Ron Parker, MD   30 mL at 12/13/11 2307  . aspirin chewable tablet 81 mg  81 mg Oral Daily Ron Parker, MD   81 mg at 12/16/11 1056  . buPROPion (WELLBUTRIN SR) 12 hr tablet 150 mg  150 mg Oral BID Ron Parker, MD   150 mg at 12/16/11 1056  . cyanocobalamin tablet 500 mcg  500 mcg Oral Daily Ron Parker, MD   500 mcg at 12/16/11 1056  . gabapentin (NEURONTIN) capsule 300 mg  300 mg Oral TID Ron Parker, MD   300 mg at 12/16/11 1750  . guaiFENesin (ROBITUSSIN) 100 MG/5ML solution 100 mg  5 mL Oral Q4H PRN Michelle A. Ashley Royalty, MD   100 mg at 12/15/11 1537  . HYDROmorphone (DILAUDID) injection 0.5-1 mg  0.5-1 mg Intravenous Q3H PRN Ron Parker, MD   1 mg at 12/13/11 1938  . mulitivitamin with minerals tablet 1 tablet  1 tablet Oral Daily  Ron Parker, MD   1 tablet at 12/16/11 1056  . ondansetron (ZOFRAN) tablet 4 mg  4 mg Oral Q6H PRN Ron Parker, MD       Or  . ondansetron [facility]) injection 4 mg  4 mg Intravenous Q6H PRN Ron Parker, MD   4 mg at 12/13/11 2129  . oxyCODONE (Oxy IR/ROXICODONE) immediate release tablet 5 mg  5 mg Oral Q4H PRN Ron Parker, MD   5 mg at 12/16/11 1439  . predniSONE (DELTASONE) tablet 20 mg  20 mg Oral Once Melanie Pellot, MD      . predniSONE (DELTASONE) tablet 40 mg  40 mg Oral Once Serene Kopf, MD   40 mg at 12/16/11 1406  . predniSONE (DELTASONE) tablet 40 mg  40 mg Oral Q breakfast Decklin Weddington, MD      . simvastatin (ZOCOR) tablet 20 mg  20 mg Oral QHS Ron Parker, MD   20 mg at 12/15/11 2029  . vancomycin (VANCOCIN) 1,500 mg in sodium chloride 0.9 % 500 mL IVPB  1,500 mg Intravenous Q24H Annia Belt, PHARMD   1,500 mg at 12/15/11 2030  . zolpidem (AMBIEN) tablet 5 mg  5  mg Oral QHS PRN Ron Parker, MD       Allergies  Allergen Reactions  . Augmentin    Active Problems:  Pyelonephritis  Dyslipidemia  Renal failure  Dehydration  Hyponatremia   Vital signs in last 24 hours: Temp:  [98.2 F (36.8 C)-99.2 F (37.3 C)] 98.7 F (37.1 C) (01/17 1357) Pulse Rate:  [59-60] 59  (01/17 1357) Resp:  [18] 18  (01/17 1357) BP: (128-161)/(75-90) 161/90 mmHg (01/17 1357) SpO2:  [93 %-97 %] 95 % (01/17 1357) Weight change:  Last BM Date: 12/13/11  Intake/Output from previous day: 01/16 0701 - 01/17 0700 In: 1180 [P.O.:480; I.V.:600; IV Piggyback:100] Out: 2500 [Urine:2500] Intake/Output this shift: Total I/O In: 240 [P.O.:240] Out: 1150 [Urine:1150]  Lab Results:  Basename 12/16/11 0335 12/14/11 0330  WBC 5.5 7.7  HGB 11.7* 13.6  HCT 34.7* 39.7  PLT 143* 125*   BMET  Basename 12/16/11 0335 12/14/11 0330  NA 136 136  K 3.9 3.8  CL 104 99  CO2 24 27  GLUCOSE 103* 107*  BUN 23 31*  CREATININE 1.32 1.99*  CALCIUM 8.5 8.7     Studies/Results: No results found.  Medications: I have reviewed the patient's current medications.   Physical exam GENERAL- alert HEAD- normal atraumatic, no neck masses, normal thyroid, no jvd RESPIRATORY- appears well, vitals normal, no respiratory distress, acyanotic, normal RR, ear and throat exam is normal, neck free of mass or lymphadenopathy, chest clear, no wheezing, crepitations, rhonchi, normal symmetric air entry CVS- regular rate and rhythm, S1, S2 normal, no murmur, click, rub or gallop ABDOMEN- no acute changes. NEURO- Grossly normal EXTREMITIES- some erythema and swelling both great toes.  Plan  1. Enterococcus Pyelonephritis- day3 vanc. Afebrile. Wbc normal. Continue vanc for now.  Needs 7 days of vanc/ampicillin/linezolid.  2. Acute on chronic ckd stage 3-improved with ivf. Monitor. 3. Thrombocytopenia- improving. Monitor.  4. Dyslipidemia 5. Gout attack- steroids, in view of ckd.      Tyrhonda Georgiades 12/16/2011 6:49 PM Pager: [phone].

## 2011-12-17 DIAGNOSIS — M109 Gout, unspecified: Secondary | ICD-10-CM | POA: Diagnosis not present

## 2011-12-17 LAB — BASIC METABOLIC PANEL
BUN: 18 mg/dL (ref 6–23)
CO2: 23 mEq/L (ref 19–32)
Calcium: 9.2 mg/dL (ref 8.4–10.5)
Creatinine, Ser: 1.11 mg/dL (ref 0.50–1.35)
Glucose, Bld: 165 mg/dL — ABNORMAL HIGH (ref 70–99)

## 2011-12-17 LAB — CBC
HCT: 36.7 % — ABNORMAL LOW (ref 39.0–52.0)
MCH: 30.8 pg (ref 26.0–34.0)
MCV: 88.9 fL (ref 78.0–100.0)
Platelets: 168 10*3/uL (ref 150–400)
RBC: 4.13 MIL/uL — ABNORMAL LOW (ref 4.22–5.81)

## 2011-12-17 MED ORDER — PREDNISONE 20 MG PO TABS
30.0000 mg | ORAL_TABLET | Freq: Every day | ORAL | Status: DC
  Filled 2011-12-17: qty 1

## 2011-12-17 MED ORDER — PREDNISONE 10 MG PO TABS: ORAL_TABLET | ORAL | Status: DC

## 2011-12-17 MED ORDER — INDOMETHACIN 50 MG PO CAPS
50.0000 mg | ORAL_CAPSULE | Freq: Once | ORAL | Status: AC
  Administered 2011-12-17: 50 mg via ORAL
  Filled 2011-12-17: qty 1

## 2011-12-17 MED ORDER — LINEZOLID 600 MG PO TABS: 600.0000 mg | ORAL_TABLET | Freq: Two times a day (BID) | ORAL | Status: AC

## 2011-12-17 NOTE — Progress Notes (Signed)
ANTIBIOTIC CONSULT NOTE - FOLLOW UP  Pharmacy Consult for Vancomycin Indication: Pyelonephritis  Allergies  Allergen Reactions  . Augmentin     Patient Measurements: Height: 6\' 2"  (188 cm) Weight: 226 lb 13.7 oz (102.9 kg) IBW/kg (Calculated) : 82.2    Vital Signs: Temp: 98.1 F (36.7 C) (01/18 0456) Temp src: Oral (01/18 0456) BP: 125/63 mmHg (01/18 0456) Pulse Rate: 55  (01/18 0456) Intake/Output from previous day: 01/17 0701 - 01/18 0700 In: 1330 [P.O.:1330] Out: 4550 [Urine:4550] Intake/Output from this shift:    Labs:  Basename 12/17/11 0347 12/16/11 0335  WBC 5.1 5.5  HGB 12.7* 11.7*  PLT 168 143*  LABCREA -- --  CREATININE 1.11 1.32   Estimated Creatinine Clearance: 69.1 ml/min (by C-G formula based on Cr of 1.11). Normalized CrCl 22ml/min/1.73m2    Microbiology: Recent Results (from the past 720 hour(s))  URINE CULTURE     Status: Normal   Collection Time   12/13/11  2:53 AM      Component Value Range Status Comment   Specimen Description URINE, CATHETERIZED   Final    Special Requests Normal   Final    Setup Time [id number]   Final    Colony Count >=100,000 COLONIES/ML   Final    Culture ENTEROCOCCUS SPECIES   Final    Report Status 12/15/2011 FINAL   Final    Organism ID, Bacteria ENTEROCOCCUS SPECIES   Final     Anti-infectives     Start     Dose/Rate Route Frequency Ordered Stop   12/14/11 2100   vancomycin (VANCOCIN) 1,500 mg in sodium chloride 0.9 % 500 mL IVPB        1,500 mg 250 mL/hr over 120 Minutes Intravenous Every 24 hours 12/14/11 2018     12/13/11 1430   ceFEPIme (MAXIPIME) 2 g in dextrose 5 % 50 mL IVPB  Status:  Discontinued        2 g 100 mL/hr over 30 Minutes Intravenous Every 24 hours 12/13/11 1351 12/15/11 1602   12/13/11 0630   cefTRIAXone (ROCEPHIN) 1 g in dextrose 5 % 50 mL IVPB  Status:  Discontinued        1 g 100 mL/hr over 30 Minutes Intravenous Every 24 hours 12/13/11 0630 12/13/11 1139   12/13/11 0415    ciprofloxacin (CIPRO) IVPB 400 mg        400 mg 200 mL/hr over 60 Minutes Intravenous  Once 12/13/11 0409 12/13/11 0526          Assessment: 78 yo M on D#4 Vancomycin 1500mg  IV q24h. SCr improved. WBC wnl.   Goal of Therapy:  Vancomycin trough level 10-15 mcg/ml  Plan:  Current dose is still appropriate. No change. Will not check trough given expected d/c tomorrow. Continue to monitor.   Gwen Her PharmD  [phone] 12/17/2011 1:37 PM

## 2011-12-17 NOTE — Progress Notes (Signed)
SUBJECTIVE Says leg pain better.   1. Pyelonephritis     Past Medical History  Diagnosis Date  . History of colostomy   . Urostomy stenosis     Ask patient to clarify, per history form dated 04/02/11.  Marland Kitchen Peristomal hernia   . Prostate cancer   . Skin cancer    Current Facility-Administered Medications  Medication Dose Route Frequency Provider Last Rate Last Dose  . 0.9 %  sodium chloride infusion   Intravenous Continuous Naketa Daddario, MD 40 mL/hr at 12/16/11 1440    . acetaminophen (TYLENOL) tablet 650 mg  650 mg Oral Q6H PRN Ron Parker, MD   650 mg at 12/15/11 2204   Or  . acetaminophen (TYLENOL) suppository 650 mg  650 mg Rectal Q6H PRN Harvette Velora Heckler, MD      . albuterol (PROVENTIL) (5 MG/ML) 0.5% nebulizer solution 2.5 mg  2.5 mg Nebulization Q2H PRN Jeoffrey Massed, MD      . alum & mag hydroxide-simeth (MAALOX/MYLANTA) 200-200-20 MG/5ML suspension 15 mL  15 mL Oral Daily Elvira Chika Madueme, PHARMD   15 mL at 12/17/11 1100  . alum & mag hydroxide-simeth (MAALOX/MYLANTA) 200-200-20 MG/5ML suspension 30 mL  30 mL Oral Q6H PRN Ron Parker, MD   30 mL at 12/13/11 2307  . aspirin chewable tablet 81 mg  81 mg Oral Daily Ron Parker, MD   81 mg at 12/17/11 1100  . buPROPion (WELLBUTRIN SR) 12 hr tablet 150 mg  150 mg Oral BID Ron Parker, MD   150 mg at 12/17/11 1100  . cyanocobalamin tablet 500 mcg  500 mcg Oral Daily Ron Parker, MD   500 mcg at 12/17/11 1100  . gabapentin (NEURONTIN) capsule 300 mg  300 mg Oral TID Ron Parker, MD   300 mg at 12/17/11 1100  . guaiFENesin (ROBITUSSIN) 100 MG/5ML solution 100 mg  5 mL Oral Q4H PRN Michelle A. Ashley Royalty, MD   100 mg at 12/15/11 1537  . HYDROmorphone (DILAUDID) injection 0.5-1 mg  0.5-1 mg Intravenous Q3H PRN Ron Parker, MD   1 mg at 12/13/11 1938  . indomethacin (INDOCIN) capsule 50 mg  50 mg Oral Once Conley Canal, MD      . mulitivitamin with minerals tablet 1 tablet  1 tablet  Oral Daily Ron Parker, MD   1 tablet at 12/17/11 1100  . ondansetron (ZOFRAN) tablet 4 mg  4 mg Oral Q6H PRN Ron Parker, MD       Or  . ondansetron [facility]) injection 4 mg  4 mg Intravenous Q6H PRN Ron Parker, MD   4 mg at 12/13/11 2129  . oxyCODONE (Oxy IR/ROXICODONE) immediate release tablet 5 mg  5 mg Oral Q4H PRN Ron Parker, MD   5 mg at 12/16/11 1439  . polyethylene glycol (MIRALAX / GLYCOLAX) packet 17 g  17 g Oral Daily Catalea Labrecque, MD   17 g at 12/17/11 1100  . predniSONE (DELTASONE) tablet 20 mg  20 mg Oral Once Lyllie Cobbins, MD   20 mg at 12/16/11 2328  . predniSONE (DELTASONE) tablet 30 mg  30 mg Oral Q breakfast Miela Desjardin, MD      . predniSONE (DELTASONE) tablet 40 mg  40 mg Oral Once Chalonda Schlatter, MD   40 mg at 12/16/11 1406  . simvastatin (ZOCOR) tablet 20 mg  20 mg Oral QHS Ron Parker, MD   20 mg at 12/16/11 2329  .  vancomycin (VANCOCIN) 1,500 mg in sodium chloride 0.9 % 500 mL IVPB  1,500 mg Intravenous Q24H Annia Belt, PHARMD   1,500 mg at 12/16/11 2328  . zolpidem (AMBIEN) tablet 5 mg  5 mg Oral QHS PRN Ron Parker, MD      . DISCONTD: predniSONE (DELTASONE) tablet 40 mg  40 mg Oral Q breakfast Kerly Rigsbee, MD   40 mg at 12/17/11 0909   Allergies  Allergen Reactions  . Augmentin    Active Problems:  Pyelonephritis  Dyslipidemia  Renal failure  Dehydration  Hyponatremia   Vital signs in last 24 hours: Temp:  [89.3 F (31.8 C)-98.7 F (37.1 C)] 98.1 F (36.7 C) (01/18 0456) Pulse Rate:  [55-62] 55  (01/18 0456) Resp:  [18-20] 18  (01/18 0456) BP: (125-161)/(63-90) 125/63 mmHg (01/18 0456) SpO2:  [92 %-96 %] 96 % (01/18 0456) Weight change:  Last BM Date: 12/13/11  Intake/Output from previous day: 01/17 0701 - 01/18 0700 In: 1330 [P.O.:1330] Out: 4550 [Urine:4550] Intake/Output this shift:    Lab Results:  Basename 12/17/11 0347 12/16/11 0335  WBC 5.1 5.5  HGB 12.7* 11.7*  HCT 36.7*  34.7*  PLT 168 143*   BMET  Basename 12/17/11 0347 12/16/11 0335  NA 134* 136  K 4.4 3.9  CL 102 104  CO2 23 24  GLUCOSE 165* 103*  BUN 18 23  CREATININE 1.11 1.32  CALCIUM 9.2 8.5    Studies/Results: No results found.  Medications: I have reviewed the patient's current medications.   Physical exam GENERAL- alert HEAD- normal atraumatic, no neck masses, normal thyroid, no jvd RESPIRATORY- appears well, vitals normal, no respiratory distress, acyanotic, normal RR, ear and throat exam is normal, neck free of mass or lymphadenopathy, chest clear, no wheezing, crepitations, rhonchi, normal symmetric air entry CVS- regular rate and rhythm, S1, S2 normal, no murmur, click, rub or gallop ABDOMEN- abdomen is soft without significant changes. NEURO- Grossly normal EXTREMITIES- left great remains swollen, though some what subsided. Right toe seems at baseline.  Plan  1. Enterococcus Pyelonephritis- day4 vanc. Continues to improve. Afebrile. Wbc normal. Continue vanc for now. Needs 7 days of vanc/ampicillin/linezolid.  2. Acute on chronic ckd stage 3-improved with ivf.  D/c ivf. 3. Thrombocytopenia- improving. Monitor.  4. Dyslipidemia  5. Gout attack- clinically better. renal fxn better. Will give dose of indocid. 6. Mobilize. Hopefully home in am.     Katiria Calame 12/17/2011 11:34 AM Pager: [phone].

## 2011-12-17 NOTE — Discharge Summary (Signed)
DISCHARGE SUMMARY  Michael Novak  MR#: [id number]  DOB:1930/04/12  Date of Admission: 12/13/2011 Date of Discharge: 12/17/2011  Attending Physician:Clarabell Matsuoka  Patient's WUJ:WJXBJ, Lenon Curt, MD, MD  Consults: none.  Discharge Diagnoses: Present on Admission:  .Enterococcus Pyelonephritis .Dyslipidemia .Renal failure .Dehydration .Hyponatremia .Thrombocytopenia . Gout arthropathy.    Current Discharge Medication List    START taking these medications   Details  linezolid (ZYVOX) 600 MG tablet Take 1 tablet (600 mg total) by mouth every 12 (twelve) hours. Qty: 10 tablet, Refills: 0    predniSONE (DELTASONE) 10 MG tablet Take 3 tablets daily for 2 days, then 2 tablets daily for 2 days, then 1 tablet daily for 3 days. Qty: 13 tablet, Refills: 0      CONTINUE these medications which have NOT CHANGED   Details  aspirin 81 MG tablet Take 81 mg by mouth daily. Ask patient to clarify dosage. Not indicated on history form dated 04/02/11.     buPROPion (WELLBUTRIN SR) 150 MG 12 hr tablet Take 150 mg by mouth 2 (two) times daily.      calcium & magnesium carbonates (MYLANTA) 311-232 MG per tablet Take 1 tablet by mouth daily.      gabapentin (NEURONTIN) 300 MG capsule Take 300 mg by mouth 3 (three) times daily.      glucosamine-chondroitin 500-400 MG tablet Take 1 tablet by mouth 3 (three) times daily.    Multiple Vitamin (MULTIVITAMIN) tablet Take 1 tablet by mouth daily.      nitrofurantoin (MACRODANTIN) 50 MG capsule Take 50 mg by mouth at bedtime.    simvastatin (ZOCOR) 20 MG tablet Take 20 mg by mouth at bedtime.      vitamin B-12 (CYANOCOBALAMIN) 500 MCG tablet Take 500 mcg by mouth daily.        STOP taking these medications     ciprofloxacin (CIPRO) 500 MG tablet          Hospital Course: Pleasant elderly male with multiple comorbids who came in with costophrenic angle pain. He was found to have Enterococcus pyelonephritis. The enterococcus sensitive to  ampicillin and vanc. He  Has been discharged on 5 more days of abx(linezolid). He will need follow up of platelets in the next week as plt count was slightly low. He developed a gout attack involving both great toes. Responded to prednisone. He uses indocid at home but I held this because of the ckd. Renal function improved with ivf, bun/cr 18/1.11 at d/c. plt count 168. He will d/c on zyvox to complete course of treatment.   Day of Discharge BP 132/62  Pulse 63  Temp(Src) 97.5 F (36.4 C) (Oral)  Resp 20  Ht 6\' 2"  (1.88 m)  Wt 102.9 kg (226 lb 13.7 oz)  BMI 29.13 kg/m2  SpO2 91%  Physical Exam: At baseline.  Results for orders placed during the hospital encounter of 12/13/11 (from the past 24 hour(s))  CBC     Status: Abnormal   Collection Time   12/17/11  3:47 AM      Component Value Range   WBC 5.1  4.0 - 10.5 (K/uL)   RBC 4.13 (*) 4.22 - 5.81 (MIL/uL)   Hemoglobin 12.7 (*) 13.0 - 17.0 (g/dL)   HCT 47.8 (*) 29.5 - 52.0 (%)   MCV 88.9  78.0 - 100.0 (fL)   MCH 30.8  26.0 - 34.0 (pg)   MCHC 34.6  30.0 - 36.0 (g/dL)   RDW 62.1  30.8 - 65.7 (%)   Platelets  168  150 - 400 (K/uL)  BASIC METABOLIC PANEL     Status: Abnormal   Collection Time   12/17/11  3:47 AM      Component Value Range   Sodium 134 (*) 135 - 145 (mEq/L)   Potassium 4.4  3.5 - 5.1 (mEq/L)   Chloride 102  96 - 112 (mEq/L)   CO2 23  19 - 32 (mEq/L)   Glucose, Bld 165 (*) 70 - 99 (mg/dL)   BUN 18  6 - 23 (mg/dL)   Creatinine, Ser 1.61  0.50 - 1.35 (mg/dL)   Calcium 9.2  8.4 - 09.6 (mg/dL)   GFR calc non Af Amer 61 (*) >90 (mL/min)   GFR calc Af Amer 71 (*) >90 (mL/min)    Disposition:    Follow-up Appts: Discharge Orders    Future Orders Please Complete By Expires   Diet - low sodium heart healthy      Increase activity slowly         Follow-up Information    Follow up with GREEN, Lenon Curt, MD .         Tests Needing Follow-up: Cbc/bmp.  Time spent in discharge (includes decision making &  examination of pt): 40 minutes  Signed: Teneka Malmberg 12/17/2011, 7:00 PM

## 2011-12-22 DIAGNOSIS — N12 Tubulo-interstitial nephritis, not specified as acute or chronic: Secondary | ICD-10-CM

## 2011-12-22 DIAGNOSIS — D696 Thrombocytopenia, unspecified: Secondary | ICD-10-CM

## 2011-12-22 DIAGNOSIS — N179 Acute kidney failure, unspecified: Secondary | ICD-10-CM

## 2011-12-22 HISTORY — DX: Thrombocytopenia, unspecified: D69.6

## 2011-12-22 HISTORY — DX: Tubulo-interstitial nephritis, not specified as acute or chronic: N12

## 2011-12-22 HISTORY — DX: Acute kidney failure, unspecified: N17.9

## 2012-01-25 ENCOUNTER — Encounter (HOSPITAL_COMMUNITY): Payer: Self-pay | Admitting: *Deleted

## 2012-01-25 ENCOUNTER — Inpatient Hospital Stay (HOSPITAL_COMMUNITY)
Admission: EM | Admit: 2012-01-25 | Discharge: 2012-02-01 | DRG: 689 | Disposition: A | Discharge status: Home / Self Care | Payer: Medicare Other | Attending: Internal Medicine | Admitting: Internal Medicine

## 2012-01-25 ENCOUNTER — Emergency Department (HOSPITAL_COMMUNITY): Payer: Medicare Other

## 2012-01-25 DIAGNOSIS — N189 Chronic kidney disease, unspecified: Secondary | ICD-10-CM | POA: Diagnosis present

## 2012-01-25 DIAGNOSIS — Z8546 Personal history of malignant neoplasm of prostate: Secondary | ICD-10-CM

## 2012-01-25 DIAGNOSIS — G9341 Metabolic encephalopathy: Secondary | ICD-10-CM | POA: Diagnosis not present

## 2012-01-25 DIAGNOSIS — M109 Gout, unspecified: Secondary | ICD-10-CM

## 2012-01-25 DIAGNOSIS — Z7982 Long term (current) use of aspirin: Secondary | ICD-10-CM

## 2012-01-25 DIAGNOSIS — N12 Tubulo-interstitial nephritis, not specified as acute or chronic: Secondary | ICD-10-CM | POA: Diagnosis present

## 2012-01-25 DIAGNOSIS — Z79899 Other long term (current) drug therapy: Secondary | ICD-10-CM

## 2012-01-25 DIAGNOSIS — R197 Diarrhea, unspecified: Secondary | ICD-10-CM | POA: Diagnosis not present

## 2012-01-25 DIAGNOSIS — N1330 Unspecified hydronephrosis: Secondary | ICD-10-CM | POA: Diagnosis present

## 2012-01-25 DIAGNOSIS — E876 Hypokalemia: Secondary | ICD-10-CM | POA: Diagnosis not present

## 2012-01-25 DIAGNOSIS — N179 Acute kidney failure, unspecified: Secondary | ICD-10-CM | POA: Diagnosis present

## 2012-01-25 DIAGNOSIS — Z933 Colostomy status: Secondary | ICD-10-CM

## 2012-01-25 DIAGNOSIS — E860 Dehydration: Secondary | ICD-10-CM | POA: Diagnosis present

## 2012-01-25 DIAGNOSIS — J449 Chronic obstructive pulmonary disease, unspecified: Secondary | ICD-10-CM | POA: Diagnosis present

## 2012-01-25 DIAGNOSIS — Z87440 Personal history of urinary (tract) infections: Secondary | ICD-10-CM

## 2012-01-25 DIAGNOSIS — Z906 Acquired absence of other parts of urinary tract: Secondary | ICD-10-CM

## 2012-01-25 DIAGNOSIS — J4489 Other specified chronic obstructive pulmonary disease: Secondary | ICD-10-CM | POA: Diagnosis present

## 2012-01-25 DIAGNOSIS — B961 Klebsiella pneumoniae [K. pneumoniae] as the cause of diseases classified elsewhere: Secondary | ICD-10-CM | POA: Diagnosis present

## 2012-01-25 DIAGNOSIS — D696 Thrombocytopenia, unspecified: Secondary | ICD-10-CM | POA: Diagnosis present

## 2012-01-25 LAB — URINE MICROSCOPIC-ADD ON

## 2012-01-25 LAB — BASIC METABOLIC PANEL
CO2: 24 mEq/L (ref 19–32)
Calcium: 9.5 mg/dL (ref 8.4–10.5)
Chloride: 99 mEq/L (ref 96–112)
Potassium: 4.3 mEq/L (ref 3.5–5.1)
Sodium: 135 mEq/L (ref 135–145)

## 2012-01-25 LAB — CBC
Platelets: 137 10*3/uL — ABNORMAL LOW (ref 150–400)
RDW: 14.3 % (ref 11.5–15.5)
WBC: 9 10*3/uL (ref 4.0–10.5)

## 2012-01-25 LAB — DIFFERENTIAL
Basophils Absolute: 0 10*3/uL (ref 0.0–0.1)
Lymphocytes Relative: 8 % — ABNORMAL LOW (ref 12–46)
Neutro Abs: 7.2 10*3/uL (ref 1.7–7.7)
Neutrophils Relative %: 81 % — ABNORMAL HIGH (ref 43–77)

## 2012-01-25 LAB — URINALYSIS, ROUTINE W REFLEX MICROSCOPIC
Glucose, UA: NEGATIVE mg/dL
Protein, ur: >300 mg/dL — AB
pH: 5.5 (ref 5.0–8.0)

## 2012-01-25 MED ORDER — HYDROMORPHONE HCL PF 1 MG/ML IJ SOLN
0.5000 mg | INTRAMUSCULAR | Status: DC | PRN
  Administered 2012-01-25: 17:00:00 via INTRAVENOUS
  Administered 2012-01-25: 0.5 mg via INTRAVENOUS
  Filled 2012-01-25 (×2): qty 1

## 2012-01-25 MED ORDER — VITAMIN B-12 500 MCG PO TABS
500.0000 ug | ORAL_TABLET | Freq: Every day | ORAL | Status: DC
Start: 2012-01-25 — End: 2012-01-25

## 2012-01-25 MED ORDER — CIPROFLOXACIN IN D5W 400 MG/200ML IV SOLN
400.0000 mg | Freq: Two times a day (BID) | INTRAVENOUS | Status: DC
  Filled 2012-01-25 (×2): qty 200

## 2012-01-25 MED ORDER — ONE-DAILY MULTI VITAMINS PO TABS
1.0000 | ORAL_TABLET | Freq: Every day | ORAL | Status: DC
Start: 2012-01-25 — End: 2012-01-25

## 2012-01-25 MED ORDER — OMEGA-3-ACID ETHYL ESTERS 1 G PO CAPS
1.0000 g | ORAL_CAPSULE | Freq: Two times a day (BID) | ORAL | Status: DC
  Administered 2012-01-25 – 2012-02-01 (×15): 1 g via ORAL
  Filled 2012-01-25 (×16): qty 1

## 2012-01-25 MED ORDER — GUAIFENESIN-DM 100-10 MG/5ML PO SYRP
10.0000 mL | ORAL_SOLUTION | ORAL | Status: DC | PRN
  Administered 2012-01-25 – 2012-01-27 (×3): 10 mL via ORAL
  Filled 2012-01-25 (×5): qty 10

## 2012-01-25 MED ORDER — ONDANSETRON HCL 4 MG/2ML IJ SOLN: 4.0000 mg | Freq: Three times a day (TID) | INTRAMUSCULAR | Status: DC | PRN

## 2012-01-25 MED ORDER — DEXTROSE 5 % IV SOLN
1.0000 g | INTRAVENOUS | Status: DC
  Administered 2012-01-25 – 2012-01-29 (×5): 1 g via INTRAVENOUS
  Filled 2012-01-25 (×5): qty 10

## 2012-01-25 MED ORDER — ALUM & MAG HYDROXIDE-SIMETH 200-200-20 MG/5ML PO SUSP
5.0000 mL | Freq: Every day | ORAL | Status: DC
  Administered 2012-01-25: 11:00:00 via ORAL
  Administered 2012-01-26 – 2012-02-01 (×8): 5 mL via ORAL
  Filled 2012-01-25 (×9): qty 30

## 2012-01-25 MED ORDER — BUPROPION HCL ER (SR) 150 MG PO TB12
150.0000 mg | ORAL_TABLET | Freq: Two times a day (BID) | ORAL | Status: DC
  Administered 2012-01-25 – 2012-02-01 (×15): 150 mg via ORAL
  Filled 2012-01-25 (×16): qty 1

## 2012-01-25 MED ORDER — SODIUM CHLORIDE 0.9 % IV SOLN: INTRAVENOUS | Status: DC

## 2012-01-25 MED ORDER — ASPIRIN EC 81 MG PO TBEC
81.0000 mg | DELAYED_RELEASE_TABLET | Freq: Every day | ORAL | Status: DC
  Administered 2012-01-25 – 2012-02-01 (×8): 81 mg via ORAL
  Filled 2012-01-25 (×8): qty 1

## 2012-01-25 MED ORDER — CIPROFLOXACIN IN D5W 400 MG/200ML IV SOLN
400.0000 mg | Freq: Once | INTRAVENOUS | Status: AC
  Administered 2012-01-25: 400 mg via INTRAVENOUS
  Filled 2012-01-25: qty 200

## 2012-01-25 MED ORDER — SODIUM CHLORIDE 0.9 % IV SOLN
INTRAVENOUS | Status: DC
  Administered 2012-01-25: 12:00:00 via INTRAVENOUS
  Administered 2012-01-26 (×2): 1000 mL via INTRAVENOUS
  Administered 2012-01-26: 06:00:00 via INTRAVENOUS
  Administered 2012-01-27 (×3): 1000 mL via INTRAVENOUS
  Administered 2012-01-28 (×2): via INTRAVENOUS
  Administered 2012-01-29: 1000 mL via INTRAVENOUS
  Administered 2012-01-29: 100 mL/h via INTRAVENOUS
  Administered 2012-01-30: 1000 mL via INTRAVENOUS
  Administered 2012-01-30: 23:00:00 via INTRAVENOUS
  Administered 2012-01-31: 100 mL/h via INTRAVENOUS
  Administered 2012-01-31: 20:00:00 via INTRAVENOUS

## 2012-01-25 MED ORDER — HYDROMORPHONE HCL PF 1 MG/ML IJ SOLN: 1.0000 mg | INTRAMUSCULAR | Status: DC | PRN

## 2012-01-25 MED ORDER — ONDANSETRON HCL 4 MG/2ML IJ SOLN
4.0000 mg | Freq: Once | INTRAMUSCULAR | Status: AC
  Administered 2012-01-25: 4 mg via INTRAVENOUS
  Filled 2012-01-25: qty 2

## 2012-01-25 MED ORDER — ALBUTEROL SULFATE (5 MG/ML) 0.5% IN NEBU: 2.5000 mg | INHALATION_SOLUTION | Freq: Four times a day (QID) | RESPIRATORY_TRACT | Status: DC | PRN

## 2012-01-25 MED ORDER — CYANOCOBALAMIN 500 MCG PO TABS
500.0000 ug | ORAL_TABLET | Freq: Every day | ORAL | Status: DC
  Administered 2012-01-25 – 2012-02-01 (×8): 500 ug via ORAL
  Filled 2012-01-25 (×8): qty 1

## 2012-01-25 MED ORDER — PROMETHAZINE HCL 25 MG/ML IJ SOLN
25.0000 mg | INTRAMUSCULAR | Status: AC
  Administered 2012-01-25: 25 mg via INTRAVENOUS
  Filled 2012-01-25: qty 1

## 2012-01-25 MED ORDER — HYDROMORPHONE HCL PF 1 MG/ML IJ SOLN
1.0000 mg | Freq: Once | INTRAMUSCULAR | Status: AC
  Administered 2012-01-25: 1 mg via INTRAVENOUS
  Filled 2012-01-25: qty 1

## 2012-01-25 MED ORDER — GABAPENTIN 300 MG PO CAPS
300.0000 mg | ORAL_CAPSULE | Freq: Three times a day (TID) | ORAL | Status: DC
  Administered 2012-01-25 – 2012-02-01 (×22): 300 mg via ORAL
  Filled 2012-01-25 (×24): qty 1

## 2012-01-25 MED ORDER — SODIUM CHLORIDE 0.9 % IV SOLN
Freq: Once | INTRAVENOUS | Status: AC
  Administered 2012-01-25: 06:00:00 via INTRAVENOUS

## 2012-01-25 MED ORDER — VANCOMYCIN HCL 1000 MG IV SOLR
1500.0000 mg | INTRAVENOUS | Status: DC
  Administered 2012-01-25: 1500 mg via INTRAVENOUS
  Filled 2012-01-25 (×2): qty 1500

## 2012-01-25 MED ORDER — ADULT MULTIVITAMIN W/MINERALS CH
1.0000 | ORAL_TABLET | Freq: Every day | ORAL | Status: DC
  Administered 2012-01-25 – 2012-02-01 (×8): 1 via ORAL
  Filled 2012-01-25 (×8): qty 1

## 2012-01-25 MED ORDER — CALCIUM & MAGNESIUM CARBONATES 311-232 MG PO TABS: 1.0000 | ORAL_TABLET | Freq: Every day | ORAL | Status: DC

## 2012-01-25 MED ORDER — SIMVASTATIN 20 MG PO TABS
20.0000 mg | ORAL_TABLET | Freq: Every day | ORAL | Status: DC
  Administered 2012-01-25 – 2012-01-31 (×7): 20 mg via ORAL
  Filled 2012-01-25 (×8): qty 1

## 2012-01-25 MED ORDER — ASPIRIN 81 MG PO TABS: 81.0000 mg | ORAL_TABLET | Freq: Every day | ORAL | Status: DC

## 2012-01-25 NOTE — ED Provider Notes (Signed)
History     CSN: [id number]  Arrival date & time 01/25/12  0455   First MD Initiated Contact with Patient 01/25/12 [phone]      Chief Complaint  Patient presents with  . Nausea  . Emesis    (Consider location/radiation/quality/duration/timing/severity/associated sxs/prior treatment) HPI Comments: 78 year old male with a history of prostate cancer, diverticula urostomy and divergent colostomy who presents with the complaint of bilateral flank pain, nausea. Symptoms are gradual in onset over the course of the last 12 hours, persistent, gradually getting worse and associated with nausea and vomiting. He denies fevers. According to my review of the medical record the patient was admitted to the hospital approximately one month ago with similar symptoms and found to have pyelonephritis at that time. He does note that he started ciprofloxacin tonight with medication that he had set aside just in case he develop symptoms again. He denies chest pain, shortness of breath, headache, rashes, swelling, diarrhea, fever  Patient is a 78 y.o. male presenting with vomiting. The history is provided by the patient and medical records.  Emesis     Past Medical History  Diagnosis Date  . History of colostomy   . Urostomy stenosis     Ask patient to clarify, per history form dated 04/02/11.  Marland Kitchen Peristomal hernia   . Prostate cancer   . Skin cancer     Past Surgical History  Procedure Date  . Cystectomy, ileal coduit, colostomy 2002    for severe rectourethral fistula  . Ex lap, lysis of adhesions 2010    for SBO  . Lap repair of parastomal hernias 2009    Dr. Barnetta [facility],  Hematoma evac POD#1    History reviewed. No pertinent family history.  History  Substance Use Topics  . Smoking status: Never Smoker   . Smokeless tobacco: Never Used  . Alcohol Use: No      Review of Systems  Gastrointestinal: Positive for vomiting.  All other systems reviewed and are negative.    Allergies    Augmentin  Home Medications   Current Outpatient Rx  Name Route Sig Dispense Refill  . ASPIRIN 81 MG PO TABS Oral Take 81 mg by mouth daily. Ask patient to clarify dosage. Not indicated on history form dated 04/02/11.     Marland Kitchen BUPROPION HCL ER (SR) 150 MG PO TB12 Oral Take 150 mg by mouth 2 (two) times daily.      Marland Kitchen CALCIUM & MAGNESIUM CARBONATES 311-232 MG PO TABS Oral Take 1 tablet by mouth daily.      Marland Kitchen CIPROFLOXACIN HCL 500 MG PO TABS Oral Take 500 mg by mouth 2 (two) times daily.    Marland Kitchen GABAPENTIN 300 MG PO CAPS Oral Take 300 mg by mouth 3 (three) times daily.      Marland Kitchen GLUCOSAMINE-CHONDROITIN 500-400 MG PO TABS Oral Take 1 tablet by mouth 3 (three) times daily.    Marland Kitchen ONE-DAILY MULTI VITAMINS PO TABS Oral Take 1 tablet by mouth daily.      Marland Kitchen NITROFURANTOIN MACROCRYSTAL 50 MG PO CAPS Oral Take 50 mg by mouth at bedtime.    . OMEGA-3-ACID ETHYL ESTERS 1 G PO CAPS Oral Take 1 g by mouth 2 (two) times daily.    Marland Kitchen SIMVASTATIN 20 MG PO TABS Oral Take 20 mg by mouth at bedtime.      Marland Kitchen VITAMIN B-12 500 MCG PO TABS Oral Take 500 mcg by mouth daily.        BP 146/83  Pulse  59  Temp(Src) 98.5 F (36.9 C) (Oral)  Resp 24  SpO2 92%  Physical Exam  Nursing note and vitals reviewed. Constitutional: He appears well-developed and well-nourished. No distress.  HENT:  Head: Normocephalic and atraumatic.  Mouth/Throat: Oropharynx is clear and moist. No oropharyngeal exudate.  Eyes: Conjunctivae and EOM are normal. Pupils are equal, round, and reactive to light. Right eye exhibits no discharge. Left eye exhibits no discharge. No scleral icterus.  Neck: Normal range of motion. Neck supple. No JVD present. No thyromegaly present.  Cardiovascular: Normal rate, regular rhythm, normal heart sounds and intact distal pulses.  Exam reveals no gallop and no friction rub.   No murmur heard. Pulmonary/Chest: Effort normal and breath sounds normal. No respiratory distress. He has no wheezes. He has no rales.   Abdominal: Soft. Bowel sounds are normal. He exhibits no distension and no mass. There is tenderness ( Mild midabdominal tenderness, urostomy and colostomy bags appear to be well-healed and in place with bags appropriately attached to the skin. Urostomy is full of milky colored fluid).  Musculoskeletal: Normal range of motion. He exhibits no edema and no tenderness.  Lymphadenopathy:    He has no cervical adenopathy.  Neurological: He is alert. Coordination normal.  Skin: Skin is warm and dry. No rash noted. No erythema.  Psychiatric: He has a normal mood and affect. His behavior is normal.    ED Course  Procedures (including critical care time)  Labs Reviewed  CBC - Abnormal; Notable for the following:    Platelets 137 (*)    All other components within normal limits  DIFFERENTIAL - Abnormal; Notable for the following:    Neutrophils Relative 81 (*)    Lymphocytes Relative 8 (*)    All other components within normal limits  BASIC METABOLIC PANEL - Abnormal; Notable for the following:    Glucose, Bld 158 (*)    BUN 32 (*)    Creatinine, Ser 1.55 (*)    GFR calc non Af Amer 41 (*)    GFR calc Af Amer 47 (*)    All other components within normal limits  URINALYSIS, ROUTINE W REFLEX MICROSCOPIC - Abnormal; Notable for the following:    APPearance CLOUDY (*)    Hgb urine dipstick LARGE (*)    Protein, ur >300 (*)    Nitrite POSITIVE (*)    Leukocytes, UA MODERATE (*)    All other components within normal limits  URINE MICROSCOPIC-ADD ON - Abnormal; Notable for the following:    Bacteria, UA MANY (*)    All other components within normal limits  URINE CULTURE   No results found.   1. Pyelonephritis       MDM  Has bilateral CVA tenderness, urostomy for foot appears to be urinary infection, vital signs without tachycardia, hypotension or fever. CBC pending, fluids, anticipate antibiotics.   Laboratory evaluation shows no leukocytosis, renal function has decreased with  creatinine of 1.5 BUN of 32 a urinalysis confirms urinary infection likely in the kidneys positive nitrate, moderate leukocytes, many bacteria in the urine.  Discussed with hospitalist who will admit     Vida Roller, MD 01/25/12 [phone]

## 2012-01-25 NOTE — ED Notes (Signed)
JYN:[id number]<AO> Expected date:01/25/12<BR> Expected time: 4:26 AM<BR> Means of arrival:Ambulance<BR> Comments:<BR> Nausea, 78 yr old

## 2012-01-25 NOTE — Progress Notes (Signed)
ANTIBIOTIC CONSULT NOTE - INITIAL  Pharmacy Consult for Vancomycin Indication: UTI, recurrent  Allergies  Allergen Reactions  . Augmentin     Patient Measurements: Normalized Cr Cl ~40 ml/min   Vital Signs: Temp: 98.7 F (37.1 C) (02/26 1012) Temp src: Oral (02/26 1012) BP: 137/72 mmHg (02/26 1012) Pulse Rate: 65  (02/26 1012) Intake/Output from previous day: 02/25 0701 - 02/26 0700 In: -  Out: 250 [Urine:250] Intake/Output from this shift:    Labs:  [facility] 01/25/12 0524  WBC 9.0  HGB 15.5  PLT 137*  LABCREA --  CREATININE 1.55*   The CrCl is unknown because both a height and weight (above a minimum accepted value) are required for this calculation. No results found for this basename: VANCOTROUGH:2,VANCOPEAK:2,VANCORANDOM:2,GENTTROUGH:2,GENTPEAK:2,GENTRANDOM:2,TOBRATROUGH:2,TOBRAPEAK:2,TOBRARND:2,AMIKACINPEAK:2,AMIKACINTROU:2,AMIKACIN:2, in the last 72 hours   Microbiology: No results found for this or any previous visit (from the past 720 hour(s)).  Medical History: Past Medical History  Diagnosis Date  . History of colostomy   . Urostomy stenosis     Ask patient to clarify, per history form dated 04/02/11.  Marland Kitchen Peristomal hernia   . Prostate cancer   . Skin cancer     Medications:  Anti-infectives     Start     Dose/Rate Route Frequency Ordered Stop   01/25/12 1200   vancomycin (VANCOCIN) 1,500 mg in sodium chloride 0.9 % 500 mL IVPB        1,500 mg 250 mL/hr over 120 Minutes Intravenous Every 24 hours 01/25/12 1130     01/25/12 1100   cefTRIAXone (ROCEPHIN) 1 g in dextrose 5 % 50 mL IVPB        1 g 100 mL/hr over 30 Minutes Intravenous Every 24 hours 01/25/12 1050     01/25/12 0715   ciprofloxacin (CIPRO) IVPB 400 mg        400 mg 200 mL/hr over 60 Minutes Intravenous  Once 01/25/12 0709 01/25/12 0834         Assessment:  78 yo male with hx recurrent UTI  Was on Cipro PTA  Clearance ~ 40 ml/min (normalized)  Goal of Therapy:  Vancomycin  trough level 10-15 mcg/ml   Plan:  Begin Vancomycin 1500mg  IV q24hr Follow up culture results Narrow abx if able based on culture results, response.  Otho Bellows PharmD Pager [phone] 01/25/2012,11:32 AM

## 2012-01-25 NOTE — ED Notes (Signed)
Per EMS: pt is from home with c/o n/v. Pt was dx last month with chronic kidney infections. Pt started Cipro today for kidney infections. Pt reports lower abdominal pain. Pt is A&O x4. ems reports pt was dry heaving en route. Pt vomited x1 upon arrival to room.

## 2012-01-25 NOTE — H&P (Signed)
PCP:  Kimber Relic, MD, MD   DOA:  01/25/2012  4:55 AM  Chief Complaint:  Nausea,vomiting and flanks pain  HPI: 78 years old man with history of prostate cancer status post urostomy and diverting  Colostomy, who was recently admitted to the hospital for enterococcal pyelonephritis about a month ago . Patient presented to the ER today with chief complaint of nausea, vomiting and bilateral flanks pain since yesterday which is progressively worsening. He denies any change in bowel habits, fever or chills. Denies any chest pain, cough or shortness of breath. In the ED urinalysis showed pyuria, his creatinine elevated to 1.5 (baseline 1.1) .urine culture was sent ,patient was given ciprofloxacin, Zofran, dilaudid  and IV fluid. We are asked to admit for further management.   Alergies: Allergies  Allergen Reactions  . Augmentin     Prior to Admission medications   Medication Sig Start Date End Date Taking? Authorizing Provider  aspirin 81 MG tablet Take 81 mg by mouth daily. Ask patient to clarify dosage. Not indicated on history form dated 04/02/11.    Yes Historical Provider, MD  buPROPion (WELLBUTRIN SR) 150 MG 12 hr tablet Take 150 mg by mouth 2 (two) times daily.     Yes Historical Provider, MD  calcium & magnesium carbonates (MYLANTA) 311-232 MG per tablet Take 1 tablet by mouth daily.     Yes Historical Provider, MD  ciprofloxacin (CIPRO) 500 MG tablet Take 500 mg by mouth 2 (two) times daily.   Yes Historical Provider, MD  gabapentin (NEURONTIN) 300 MG capsule Take 300 mg by mouth 3 (three) times daily.     Yes Historical Provider, MD  glucosamine-chondroitin 500-400 MG tablet Take 1 tablet by mouth 3 (three) times daily.   Yes Historical Provider, MD  Multiple Vitamin (MULTIVITAMIN) tablet Take 1 tablet by mouth daily.     Yes Historical Provider, MD  nitrofurantoin (MACRODANTIN) 50 MG capsule Take 50 mg by mouth at bedtime.   Yes Historical Provider, MD  omega-3 acid ethyl esters  (LOVAZA) 1 G capsule Take 1 g by mouth 2 (two) times daily.   Yes Historical Provider, MD  simvastatin (ZOCOR) 20 MG tablet Take 20 mg by mouth at bedtime.     Yes Historical Provider, MD  vitamin B-12 (CYANOCOBALAMIN) 500 MCG tablet Take 500 mcg by mouth daily.     Yes Historical Provider, MD    Past Medical History  Diagnosis Date  . History of colostomy   . Urostomy stenosis     Ask patient to clarify, per history form dated 04/02/11.  Marland Kitchen Peristomal hernia   . Prostate cancer   . Skin cancer     Past Surgical History  Procedure Date  . Cystectomy, ileal coduit, colostomy 2002    for severe rectourethral fistula  . Ex lap, lysis of adhesions 2010    for SBO  . Lap repair of parastomal hernias 2009    Dr. Barnetta [facility],  Hematoma evac POD#1    Social History: Lives with his wife, reports that he has never smoked.  Marland Kitchen He reports that he does not drink alcohol or use illicit drugs .   Review of Systems:  Constitutional: Denies fever, chills, diaphoresis, appetite change and fatigue.  HEENT: Denies photophobia, eye pain, redness, hearing loss, ear pain, congestion, sore throat, rhinorrhea, sneezing, mouth sores, trouble swallowing, neck pain, neck stiffness and tinnitus.   Respiratory: Denies SOB, DOE, cough, chest tightness,  and wheezing.   Cardiovascular: Denies chest pain,  palpitations and leg swelling.  Gastrointestinal: c/o  nausea, vomiting, abdominal pain,  Flanks pain . Denies blood in stool and abdominal distention.  Neurological: Denies dizziness, seizures, syncope, weakness, light-headedness, numbness and headaches.     Physical Exam:  Filed Vitals:   01/25/12 0500 01/25/12 0507  BP: 146/83   Pulse: 59   Temp: 98.5 F (36.9 C)   TempSrc: Oral   Resp: 24   SpO2: 92% 92%    Constitutional: Vital signs reviewed.  Patient is  in no acute distress and cooperative with exam. Alert but slightly lethargic after given meds as above.  Eyes: PERRL, EOMI, conjunctivae  normal, No scleral icterus.  Neck: Supple, Trachea midline normal ROM, No JVD, mass, thyromegaly, or carotid bruit present.  Cardiovascular: RRR, S1 normal, S2 normal, no MRG, pulses symmetric and intact bilaterally Pulmonary/Chest: CTAB, no wheezes, rales, or rhonchi Abdominal: Soft. Positive CVA tenderness , non-distended, bowel sounds are normal, no masses, organomegaly, or guarding present. Urostomy and colostomy bags in place Ext: no edema and no cyanosis, pulses palpable bilaterally  Neurological: A&O x3, Strenght is normal and symmetric bilaterally, cranial nerve II-XII are grossly intact, no focal motor deficit, sensory intact to light touch bilaterally.    Labs on Admission:  Results for orders placed during the hospital encounter of 01/25/12 (from the past 48 hour(s))  CBC     Status: Abnormal   Collection Time   01/25/12  5:24 AM      Component Value Range Comment   WBC 9.0  4.0 - 10.5 (K/uL)    RBC 4.92  4.22 - 5.81 (MIL/uL)    Hemoglobin 15.5  13.0 - 17.0 (g/dL)    HCT 14.7  82.9 - 56.2 (%)    MCV 90.4  78.0 - 100.0 (fL)    MCH 31.5  26.0 - 34.0 (pg)    MCHC 34.8  30.0 - 36.0 (g/dL)    RDW 13.0  86.5 - 78.4 (%)    Platelets 137 (*) 150 - 400 (K/uL)   DIFFERENTIAL     Status: Abnormal   Collection Time   01/25/12  5:24 AM      Component Value Range Comment   Neutrophils Relative 81 (*) 43 - 77 (%)    Neutro Abs 7.2  1.7 - 7.7 (K/uL)    Lymphocytes Relative 8 (*) 12 - 46 (%)    Lymphs Abs 0.8  0.7 - 4.0 (K/uL)    Monocytes Relative 11  3 - 12 (%)    Monocytes Absolute 1.0  0.1 - 1.0 (K/uL)    Eosinophils Relative 0  0 - 5 (%)    Eosinophils Absolute 0.0  0.0 - 0.7 (K/uL)    Basophils Relative 0  0 - 1 (%)    Basophils Absolute 0.0  0.0 - 0.1 (K/uL)   BASIC METABOLIC PANEL     Status: Abnormal   Collection Time   01/25/12  5:24 AM      Component Value Range Comment   Sodium 135  135 - 145 (mEq/L)    Potassium 4.3  3.5 - 5.1 (mEq/L)    Chloride 99  96 - 112 (mEq/L)      CO2 24  19 - 32 (mEq/L)    Glucose, Bld 158 (*) 70 - 99 (mg/dL)    BUN 32 (*) 6 - 23 (mg/dL)    Creatinine, Ser 6.96 (*) 0.50 - 1.35 (mg/dL)    Calcium 9.5  8.4 - 10.5 (mg/dL)    GFR  calc non Af Amer 41 (*) >90 (mL/min)    GFR calc Af Amer 47 (*) >90 (mL/min)   URINALYSIS, ROUTINE W REFLEX MICROSCOPIC     Status: Abnormal   Collection Time   01/25/12  5:49 AM      Component Value Range Comment   Color, Urine YELLOW  YELLOW     APPearance CLOUDY (*) CLEAR     Specific Gravity, Urine 1.022  1.005 - 1.030     pH 5.5  5.0 - 8.0     Glucose, UA NEGATIVE  NEGATIVE (mg/dL)    Hgb urine dipstick LARGE (*) NEGATIVE     Bilirubin Urine NEGATIVE  NEGATIVE     Ketones, ur NEGATIVE  NEGATIVE (mg/dL)    Protein, ur >147 (*) NEGATIVE (mg/dL)    Urobilinogen, UA 0.2  0.0 - 1.0 (mg/dL)    Nitrite POSITIVE (*) NEGATIVE     Leukocytes, UA MODERATE (*) NEGATIVE    URINE MICROSCOPIC-ADD ON     Status: Abnormal   Collection Time   01/25/12  5:49 AM      Component Value Range Comment   Squamous Epithelial / LPF RARE  RARE     WBC, UA 11-20  <3 (WBC/hpf)    RBC / HPF TOO NUMEROUS TO COUNT  <3 (RBC/hpf) WITH CLUMPS   Bacteria, UA MANY (*) RARE     Urine-Other MUCOUS PRESENT   AMORPHOUS URATES/PHOSPHATES    Radiological Exams on Admission: No results found.  Assessment/Plan Principal Problem:  *Pyelonephritis  admit to med/surg floor  Hydrate normal saline. Zofran when necessary for nausea and vomiting.  Given recurrent UTIs, antibiotics choice was discussed with Dr. Daiva Eves , would treat with Rocephin and vancomycin , follow culture results and adjust accordingly.  Active Problems:  COPD (chronic obstructive pulmonary disease)  stable, nebs when necessary  History of Gout: No acute flare noted, avoid NSAIDs.  Renal failure (ARF), acute on chronic/Dehydration Continue IV fluid and monitor renal function .avoid NSAIDs and adjust medication to GFR.  Chronic thrombocytopenia  Stable continue to  monitor   DVT prophylaxis: SCDs Code status: Full code  Family contact:  I spoke to Wife Michael Novak  @ [phone] and updated her with patient's status and treatment plan.   Time Spent on Admission:  approximately 45 minutes.  Michael Novak 01/25/2012, 8:51 AM

## 2012-01-26 LAB — AMMONIA: Ammonia: 23 umol/L (ref 11–60)

## 2012-01-26 LAB — URINE CULTURE: Colony Count: 90000

## 2012-01-26 LAB — BASIC METABOLIC PANEL
BUN: 36 mg/dL — ABNORMAL HIGH (ref 6–23)
GFR calc non Af Amer: 29 mL/min — ABNORMAL LOW (ref >90)
Glucose, Bld: 145 mg/dL — ABNORMAL HIGH (ref 70–99)
Potassium: 4.1 mEq/L (ref 3.5–5.1)

## 2012-01-26 LAB — RAPID URINE DRUG SCREEN, HOSP PERFORMED
Amphetamines: NOT DETECTED
Barbiturates: NOT DETECTED
Benzodiazepines: NOT DETECTED

## 2012-01-26 LAB — CBC
Hemoglobin: 13.5 g/dL (ref 13.0–17.0)
MCH: 31.2 pg (ref 26.0–34.0)
MCHC: 34.3 g/dL (ref 30.0–36.0)

## 2012-01-26 MED ORDER — VANCOMYCIN HCL 1000 MG IV SOLR: 1500.0000 mg | INTRAVENOUS | Status: DC

## 2012-01-26 MED ORDER — SODIUM CHLORIDE 0.9 % IV BOLUS (SEPSIS)
500.0000 mL | Freq: Once | INTRAVENOUS | Status: AC
  Administered 2012-01-26: 500 mL via INTRAVENOUS

## 2012-01-26 NOTE — Progress Notes (Signed)
Subjective: Very sedated, garbled/nonsensical speech. Fever of 101.9 today.  Objective: Vital signs in last 24 hours: Temp:  [99.6 F (37.6 C)-101.9 F (38.8 C)] 101.9 F (38.8 C) (02/27 1344) Pulse Rate:  [74-93] 93  (02/27 1344) Resp:  [18] 18  (02/27 1344) BP: (121-145)/(66-75) 145/75 mmHg (02/27 1344) SpO2:  [85 %-92 %] 85 % (02/27 1344) Weight change:  Last BM Date: 01/25/12  Intake/Output from previous day: 02/26 0701 - 02/27 0700 In: 2666.7 [P.O.:240; I.V.:1876.7; IV Piggyback:550] Out: 300 [Urine:300] Total I/O In: 480 [P.O.:480] Out: -    Physical Exam: General:drowsy HEENT: No bruits, no goiter. Heart: Regular rate and rhythm, without murmurs, rubs, gallops. Lungs: Clear to auscultation bilaterally. Abdomen: Soft, nontender, nondistended, positive bowel sounds. Extremities: No clubbing cyanosis or edema with positive pedal pulses.    Lab Results: Basic Metabolic Panel:  Basename 01/26/12 0330 01/25/12 0524  NA 134* 135  K 4.1 4.3  CL 104 99  CO2 24 24  GLUCOSE 145* 158*  BUN 36* 32*  CREATININE 2.03* 1.55*  CALCIUM 8.4 9.5  MG -- --  PHOS -- --   CBC:  Basename 01/26/12 0330 01/25/12 0524  WBC 8.9 9.0  NEUTROABS -- 7.2  HGB 13.5 15.5  HCT 39.4 44.5  MCV 91.0 90.4  PLT 142* 137*   Urinalysis:  Basename 01/25/12 0549  COLORURINE YELLOW  LABSPEC 1.022  PHURINE 5.5  GLUCOSEU NEGATIVE  HGBUR LARGE*  BILIRUBINUR NEGATIVE  KETONESUR NEGATIVE  PROTEINUR >300*  UROBILINOGEN 0.2  NITRITE POSITIVE*  LEUKOCYTESUR MODERATE*    Recent Results (from the past 240 hour(s))  URINE CULTURE     Status: Normal (Preliminary result)   Collection Time   01/25/12  5:49 AM      Component Value Range Status Comment   Specimen Description URINE, RANDOM   Final    Special Requests NONE   Final    Culture  Setup Time [id number]   Final    Colony Count 90,000 COLONIES/ML   Final    Culture GRAM NEGATIVE RODS   Final    Report Status PENDING    Incomplete     Studies/Results: Dg Chest Port 1 View  01/25/2012  *RADIOLOGY REPORT*  Clinical Data: Shortness of breath, altered mental status  PORTABLE CHEST - 1 VIEW  Comparison: Portable chest x-ray of 06/15/2011  Findings: The lungs are poorly aerated.  There is cardiomegaly present with apparent moderate pulmonary vascular congestion. There may be small effusions, findings most consistent with congestive heart failure.  Two-view chest x-ray would be helpful. No bony abnormality is seen.  IMPRESSION: Poor inspiration and probable mild CHF.  Recommend two-view chest x- ray if possible.  Original Report Authenticated By: Juline Patch, M.D.    Medications: Scheduled Meds:   . alum & mag hydroxide-simeth  5 mL Oral Daily  . aspirin EC  81 mg Oral Daily  . buPROPion  150 mg Oral BID  . cefTRIAXone (ROCEPHIN)  IV  1 g Intravenous Q24H  . vitamin B-12  500 mcg Oral Daily  . gabapentin  300 mg Oral TID  . mulitivitamin with minerals  1 tablet Oral Daily  . omega-3 acid ethyl esters  1 g Oral BID  . simvastatin  20 mg Oral QHS  . DISCONTD: ciprofloxacin  400 mg Intravenous Q12H  . DISCONTD: vancomycin  1,500 mg Intravenous Q24H  . DISCONTD: vancomycin  1,500 mg Intravenous Q48H   Continuous Infusions:   . sodium chloride 1,000 mL (01/26/12 1136)  PRN Meds:.albuterol, guaiFENesin-dextromethorphan, DISCONTD:  HYDROmorphone (DILAUDID) injection  Assessment/Plan:  Principal Problem:  *Pyelonephritis Active Problems:  COPD (chronic obstructive pulmonary disease)  Gout  Renal failure (ARF), acute on chronic  Dehydration   #1 Pyelonephritis: urine cx with GNR. Will DC vanc and continue rocephin pending final culture. Has complicated kidney/ureteral anatomy with a urostomy and h/o rectourethral fistula.  #2 ARF: Creatinine increased despite IVF. Will check renal US. May need to consult urology this admission. Give IVF bolus.  #3 Fever: presumed 2/2 pyelo.  #4 Acute delirium: new  per wife. ?early sirs/sepsis. Per RN no pain meds given this shift. Check UDS, ETOH, ammonia level and LFTs. If no improvement may need to scan his head to r/o CVA.  Wife Britta Mccreedy present and updated on care plan.   LOS: 1 day   [facility] Pager: [phone] 01/26/2012, 3:58 PM

## 2012-01-26 NOTE — Progress Notes (Signed)
UR complete 

## 2012-01-26 NOTE — Progress Notes (Signed)
ANTIBIOTIC CONSULT NOTE - INITIAL  Pharmacy Consult for Vancomycin Indication: UTI, recurrent  Allergies  Allergen Reactions  . Augmentin     Patient Measurements: Normalized Cr Cl ~40 ml/min Height: 6\' 2"  (188 cm) Weight: 227 lb 11.2 oz (103.284 kg) IBW/kg (Calculated) : 82.2  Vital Signs: Temp: 99.6 F (37.6 C) (02/27 0505) Temp src: Oral (02/27 0505) BP: 121/71 mmHg (02/27 0505) Pulse Rate: 84  (02/27 0505) Intake/Output from previous day: 02/26 0701 - 02/27 0700 In: 2666.7 [P.O.:240; I.V.:1876.7; IV Piggyback:550] Out: 300 [Urine:300] Intake/Output from this shift:    Labs:  Basename 01/26/12 0330 01/25/12 0524  WBC 8.9 9.0  HGB 13.5 15.5  PLT 142* 137*  LABCREA -- --  CREATININE 2.03* 1.55*   Estimated Creatinine Clearance: 37.8 ml/min (by C-G formula based on Cr of 2.03). No results found for this basename: VANCOTROUGH:2,VANCOPEAK:2,VANCORANDOM:2,GENTTROUGH:2,GENTPEAK:2,GENTRANDOM:2,TOBRATROUGH:2,TOBRAPEAK:2,TOBRARND:2,AMIKACINPEAK:2,AMIKACINTROU:2,AMIKACIN:2, in the last 72 hours   Microbiology: Recent Results (from the past 720 hour(s))  URINE CULTURE     Status: Normal (Preliminary result)   Collection Time   01/25/12  5:49 AM      Component Value Range Status Comment   Specimen Description URINE, RANDOM   Final    Special Requests NONE   Final    Culture  Setup Time [id number]   Final    Colony Count 90,000 COLONIES/ML   Final    Culture GRAM NEGATIVE RODS   Final    Report Status PENDING   Incomplete     Medical History: Past Medical History  Diagnosis Date  . History of colostomy   . Urostomy stenosis     Ask patient to clarify, per history form dated 04/02/11.  Marland Kitchen Peristomal hernia   . Prostate cancer   . Skin cancer     Medications:  Anti-infectives     Start     Dose/Rate Route Frequency Ordered Stop   01/25/12 2000   ciprofloxacin (CIPRO) IVPB 400 mg  Status:  Discontinued        400 mg 200 mL/hr over 60 Minutes Intravenous Every 12  hours 01/25/12 1157 01/25/12 1929   01/25/12 1200   vancomycin (VANCOCIN) 1,500 mg in sodium chloride 0.9 % 500 mL IVPB        1,500 mg 250 mL/hr over 120 Minutes Intravenous Every 24 hours 01/25/12 1130     01/25/12 1100   cefTRIAXone (ROCEPHIN) 1 g in dextrose 5 % 50 mL IVPB        1 g 100 mL/hr over 30 Minutes Intravenous Every 24 hours 01/25/12 1050     01/25/12 0715   ciprofloxacin (CIPRO) IVPB 400 mg        400 mg 200 mL/hr over 60 Minutes Intravenous  Once 01/25/12 0709 01/25/12 0834         Assessment:  78 yo male with hx recurrent UTI, Day 2 Rocephin/Vancomycin awaiting culture results  Was on Cipro PTA  Worsening renal function - Clearance ~ 30 ml/min (normalized) with SCr increasing from 1.55 to 2.03  Goal of Therapy:  Vancomycin trough level 10-15 mcg/ml   Plan:   Due to rise in SCr will proceed on cautious side and change Vancomycin from 1500mg  IV q24 to 1500mg  IV q48  Recheck Scr in AM  Narrow abx if able based on culture results, response.   Hessie Knows, PharmD, BCPS pager [phone] 01/26/2012 10:29 AM

## 2012-01-27 ENCOUNTER — Inpatient Hospital Stay (HOSPITAL_COMMUNITY): Payer: Medicare Other

## 2012-01-27 LAB — COMPREHENSIVE METABOLIC PANEL
ALT: 11 U/L (ref 0–53)
AST: 13 U/L (ref 0–37)
Albumin: 2.7 g/dL — ABNORMAL LOW (ref 3.5–5.2)
CO2: 23 mEq/L (ref 19–32)
Calcium: 8.6 mg/dL (ref 8.4–10.5)
Creatinine, Ser: 1.69 mg/dL — ABNORMAL HIGH (ref 0.50–1.35)
GFR calc non Af Amer: 37 mL/min — ABNORMAL LOW (ref >90)
Sodium: 137 mEq/L (ref 135–145)

## 2012-01-27 LAB — CBC
Platelets: 133 10*3/uL — ABNORMAL LOW (ref 150–400)
RBC: 3.93 MIL/uL — ABNORMAL LOW (ref 4.22–5.81)
WBC: 7.1 10*3/uL (ref 4.0–10.5)

## 2012-01-27 MED ORDER — BIOTENE DRY MOUTH MT LIQD
15.0000 mL | Freq: Two times a day (BID) | OROMUCOSAL | Status: DC
  Administered 2012-01-27 – 2012-02-01 (×9): 15 mL via OROMUCOSAL

## 2012-01-27 NOTE — Clinical Documentation Improvement (Signed)
SEPSIS DOCUMENTATION QUERY  THIS DOCUMENT IS NOT A PERMANENT PART OF THE MEDICAL RECORD  TO RESPOND TO THE THIS QUERY, FOLLOW THE INSTRUCTIONS BELOW:  1. If needed, update documentation for the patient's encounter via the notes activity.  2. Access this query again and click edit on the In [employer].  3. After updating, or not, click F2 to complete all highlighted (required) fields concerning your review. Select "additional documentation in the medical record" OR "no additional documentation provided".  4. Click Sign note button.  5. The deficiency will fall out of your In Basket *Please let us know if you are not able to complete this workflow by phone or e-mail (listed below).  Please update your documentation within the medical record to reflect your response to this query.                                                                                    01/27/12  Dear Dr. Philip Aspen, Bea Laura Marton Redwood,  In a better effort to capture your patient's severity of illness, reflect appropriate length of stay and utilization of resources, a review of the patient medical record has revealed the following indicators.    Based on your clinical judgment, please clarify and document in a progress note and/or discharge summary the clinical condition associated with the following supporting information:  In responding to this query please exercise your independent judgment.  The fact that a query is asked, does not imply that any particular answer is desired or expected.   According to pn 01/26/12 pt with ? Early sepsis.   Please clarify whether or not sepsis qualifies as being present on admission (POA) and document in pn or d/c summary.   Possible Clinical Conditions? Present on admission  Not present on admission and developed during hospital stay.  Other Condition __________________ Cannot clinically Determine _______________  Risk Factors: Plyneprhrititis, UTI, ARF, Acute  Delirium, R/O CVA  Presenting Signs and Symptoms: PN 01/26/12 Acute delirium: new per wife. ?early sirs/sepsis. Per RN no pain meds given this shift. Check UDS, ETOH, ammonia level and LFTs. If no improvement may need to scan his head to r/o CVA  Diagnostics: Dg Chest Port 1 View  2 IMPRESSION: Poor inspiration and probable mild CHF.  Recommend two-view chest x- ray if possible.  Original Report Authenticated By: Juline Patch, M.D.    Treatment: Vancomycin    Reviewed: additional documentation in the medical record 3/1/2013ljh  Thank You,  Enis Slipper  RN, BSN, CCDS Clinical Documentation Specialist [facility] Dept Pager: [phone] / E-mail: [email or web address]   Health Information Management [place]

## 2012-01-27 NOTE — Progress Notes (Addendum)
Upon entering the room, patient observed watching TV and stated that he was well rested.Pt assisted into chair as requested to receive his breakfast. After eating 3/4 amt of breakfast;  I assisted patient into bathroom for am care. After am care, pt voiced moderate distress from activity. Pt put back to bed and repositioned to increase relaxation.  After putting Michael Novak back to bed and admistering his medications, the patient is now sleeping with no complaints of nausea, distress, or pain. Valentina Gu, SN.

## 2012-01-27 NOTE — Progress Notes (Signed)
Subjective: More awake today. Able to have full conversation with me. Feels "wiped out" and weak.  Objective: Vital signs in last 24 hours: Temp:  [98.5 F (36.9 C)-99.6 F (37.6 C)] 99.2 F (37.3 C) (02/28 1357) Pulse Rate:  [75-94] 77  (02/28 1357) Resp:  [18-24] 20  (02/28 1357) BP: (142-160)/(77-86) 143/77 mmHg (02/28 1357) SpO2:  [90 %-95 %] 95 % (02/28 1357) Weight change:  Last BM Date: 01/27/12  Intake/Output from previous day: 02/27 0701 - 02/28 0700 In: 3838.8 [P.O.:1110; I.V.:2728.8] Out: 1050 [Urine:1050] Total I/O In: 480 [P.O.:480] Out: 700 [Urine:700]   Physical Exam: General: Alert, awake, oriented x3, in no acute distress. HEENT: No bruits, no goiter. Heart: Regular rate and rhythm, without murmurs, rubs, gallops. Lungs: Clear to auscultation bilaterally. Abdomen: Soft, nontender, nondistended, positive bowel sounds. Extremities: No clubbing cyanosis or edema with positive pedal pulses.    Lab Results: Basic Metabolic Panel:  Basename 01/27/12 0330 01/26/12 0330  NA 137 134*  K 3.9 4.1  CL 106 104  CO2 23 24  GLUCOSE 95 145*  BUN 26* 36*  CREATININE 1.69* 2.03*  CALCIUM 8.6 8.4  MG -- --  PHOS -- --   Liver Function Tests:  Basename 01/27/12 0330  AST 13  ALT 11  ALKPHOS 41  BILITOT 0.4  PROT 6.0  ALBUMIN 2.7*    Basename 01/26/12 1705  AMMONIA 23   CBC:  Basename 01/27/12 0330 01/26/12 0330 01/25/12 0524  WBC 7.1 8.9 --  NEUTROABS -- -- 7.2  HGB 12.2* 13.5 --  HCT 36.1* 39.4 --  MCV 91.9 91.0 --  PLT 133* 142* --   Urine Drug Screen: Drugs of Abuse     Component Value Date/Time   LABOPIA NONE DETECTED 01/26/2012 2013   COCAINSCRNUR NONE DETECTED 01/26/2012 2013   LABBENZ NONE DETECTED 01/26/2012 2013   AMPHETMU NONE DETECTED 01/26/2012 2013   THCU NONE DETECTED 01/26/2012 2013   LABBARB NONE DETECTED 01/26/2012 2013    Alcohol Level:  Basename 01/26/12 1705  ETH <11   Urinalysis:  Basename 01/25/12 0549    COLORURINE YELLOW  LABSPEC 1.022  PHURINE 5.5  GLUCOSEU NEGATIVE  HGBUR LARGE*  BILIRUBINUR NEGATIVE  KETONESUR NEGATIVE  PROTEINUR >300*  UROBILINOGEN 0.2  NITRITE POSITIVE*  LEUKOCYTESUR MODERATE*    Recent Results (from the past 240 hour(s))  URINE CULTURE     Status: Normal   Collection Time   01/25/12  5:49 AM      Component Value Range Status Comment   Specimen Description URINE, RANDOM   Final    Special Requests NONE   Final    Culture  Setup Time [id number]   Final    Colony Count 90,000 COLONIES/ML   Final    Culture KLEBSIELLA PNEUMONIAE   Final    Report Status 01/26/2012 FINAL   Final    Organism ID, Bacteria KLEBSIELLA PNEUMONIAE   Final     Studies/Results: US Renal  01/27/2012  *RADIOLOGY REPORT*  Clinical Data: Acute renal failure and pyelonephritis.  Question hydronephrosis.  RENAL/URINARY TRACT ULTRASOUND COMPLETE  Comparison:  12/13/2011.  Findings:  Right Kidney:  Measures 16.1 cm with uniform parenchymal echogenicity.  A 2.2 x 1.9 x 2.2 cm anechoic lesion with increased through transmission is seen in the interpolar region, consistent with a cyst.  Moderate to severe right hydronephrosis appears fairly stable.  Left Kidney:  Measures 12.6 cm with uniform parenchymal echogenicity.  Mild hydronephrosis, stable.  Bladder:  Surgically absent.  IMPRESSION:  Bilateral hydronephrosis, right greater than left, stable.  Original Report Authenticated By: Reyes Ivan, M.D.    Medications: Scheduled Meds:   . alum & mag hydroxide-simeth  5 mL Oral Daily  . antiseptic oral rinse  15 mL Mouth Rinse BID  . aspirin EC  81 mg Oral Daily  . buPROPion  150 mg Oral BID  . cefTRIAXone (ROCEPHIN)  IV  1 g Intravenous Q24H  . vitamin B-12  500 mcg Oral Daily  . gabapentin  300 mg Oral TID  . mulitivitamin with minerals  1 tablet Oral Daily  . omega-3 acid ethyl esters  1 g Oral BID  . simvastatin  20 mg Oral QHS  . sodium chloride  500 mL Intravenous Once  .  DISCONTD: vancomycin  1,500 mg Intravenous Q48H   Continuous Infusions:   . sodium chloride 1,000 mL (01/27/12 1341)   PRN Meds:.albuterol, guaiFENesin-dextromethorphan, DISCONTD:  HYDROmorphone (DILAUDID) injection  Assessment/Plan:  Principal Problem:  *Pyelonephritis Active Problems:  COPD (chronic obstructive pulmonary disease)  Gout  Renal failure (ARF), acute on chronic  Dehydration   #1 Klebsiella Pyelo: Continue IV antibiotics for now. Change to PO Bactrim once ready for DC.  #2 ARF: Creatinine improving. I suspect he is close to baseline. Renal US with stable bilateral hydronephrosis.  #3 Fever: 2/2 pyelo. Improved.  #4 Encephalopathy: 2/2 pyelo, improving.  #5 weakness: likely 2/2 acute infection. Patient interested in PT evaluation. Will order.  #6 Dispo: If afebrile overnight and feeling well, anticipate DC in 1-2 days.    LOS: 2 days   [facility] Pager: [phone] 01/27/2012, 3:18 PM

## 2012-01-28 MED ORDER — ACETAMINOPHEN 325 MG PO TABS
ORAL_TABLET | ORAL | Status: AC
  Administered 2012-01-28: 650 mg via ORAL
  Filled 2012-01-28: qty 2

## 2012-01-28 MED ORDER — ACETAMINOPHEN 325 MG PO TABS
650.0000 mg | ORAL_TABLET | Freq: Four times a day (QID) | ORAL | Status: DC | PRN
  Administered 2012-01-28 (×2): 650 mg via ORAL
  Filled 2012-01-28: qty 2

## 2012-01-28 NOTE — Progress Notes (Signed)
Subjective: Doesn't feel good today. Still feels weak and feels like he may have a temperature.  Objective: Vital signs in last 24 hours: Temp:  [98.4 F (36.9 C)-99.2 F (37.3 C)] 98.6 F (37 C) (03/01 1303) Pulse Rate:  [54-78] 59  (03/01 1303) Resp:  [16-20] 18  (03/01 1303) BP: (106-143)/(68-83) 106/68 mmHg (03/01 1303) SpO2:  [92 %-98 %] 98 % (03/01 1303) Weight change:  Last BM Date: 01/27/12  Intake/Output from previous day: 02/28 0701 - 03/01 0700 In: 2541.2 [P.O.:840; I.V.:1701.2] Out: 3150 [Urine:3150] Total I/O In: -  Out: 350 [Urine:350]   Physical Exam: General: Alert, awake, oriented x3, in no acute distress. HEENT: No bruits, no goiter. Heart: Regular rate and rhythm, without murmurs, rubs, gallops. Lungs: Clear to auscultation bilaterally. Abdomen: Soft, nontender, nondistended, positive bowel sounds. Extremities: No clubbing cyanosis or edema with positive pedal pulses.    Lab Results: Basic Metabolic Panel:  Basename 01/27/12 0330 01/26/12 0330  NA 137 134*  K 3.9 4.1  CL 106 104  CO2 23 24  GLUCOSE 95 145*  BUN 26* 36*  CREATININE 1.69* 2.03*  CALCIUM 8.6 8.4  MG -- --  PHOS -- --   Liver Function Tests:  Basename 01/27/12 0330  AST 13  ALT 11  ALKPHOS 41  BILITOT 0.4  PROT 6.0  ALBUMIN 2.7*    Basename 01/26/12 1705  AMMONIA 23   CBC:  Basename 01/27/12 0330 01/26/12 0330  WBC 7.1 8.9  NEUTROABS -- --  HGB 12.2* 13.5  HCT 36.1* 39.4  MCV 91.9 91.0  PLT 133* 142*   Urine Drug Screen: Drugs of Abuse     Component Value Date/Time   LABOPIA NONE DETECTED 01/26/2012 2013   COCAINSCRNUR NONE DETECTED 01/26/2012 2013   LABBENZ NONE DETECTED 01/26/2012 2013   AMPHETMU NONE DETECTED 01/26/2012 2013   THCU NONE DETECTED 01/26/2012 2013   LABBARB NONE DETECTED 01/26/2012 2013    Alcohol Level:  Basename 01/26/12 1705  ETH <11     Recent Results (from the past 240 hour(s))  URINE CULTURE     Status: Normal   Collection  Time   01/25/12  5:49 AM      Component Value Range Status Comment   Specimen Description URINE, RANDOM   Final    Special Requests NONE   Final    Culture  Setup Time [id number]   Final    Colony Count 90,000 COLONIES/ML   Final    Culture KLEBSIELLA PNEUMONIAE   Final    Report Status 01/26/2012 FINAL   Final    Organism ID, Bacteria KLEBSIELLA PNEUMONIAE   Final   CULTURE, BLOOD (ROUTINE X 2)     Status: Normal (Preliminary result)   Collection Time   01/26/12  4:55 PM      Component Value Range Status Comment   Specimen Description BLOOD LEFT HAND   Final    Special Requests BOTTLES DRAWN AEROBIC AND ANAEROBIC 10CC   Final    Culture  Setup Time [id number]   Final    Culture     Final    Value:        BLOOD CULTURE RECEIVED NO GROWTH TO DATE CULTURE WILL BE HELD FOR 5 DAYS BEFORE ISSUING A FINAL NEGATIVE REPORT   Report Status PENDING   Incomplete   CULTURE, BLOOD (ROUTINE X 2)     Status: Normal (Preliminary result)   Collection Time   01/26/12  5:05 PM  Component Value Range Status Comment   Specimen Description BLOOD LEFT ARM   Final    Special Requests BOTTLES DRAWN AEROBIC AND ANAEROBIC 10CC   Final    Culture  Setup Time [id number]   Final    Culture     Final    Value:        BLOOD CULTURE RECEIVED NO GROWTH TO DATE CULTURE WILL BE HELD FOR 5 DAYS BEFORE ISSUING A FINAL NEGATIVE REPORT   Report Status PENDING   Incomplete     Studies/Results: US Renal  01/27/2012  *RADIOLOGY REPORT*  Clinical Data: Acute renal failure and pyelonephritis.  Question hydronephrosis.  RENAL/URINARY TRACT ULTRASOUND COMPLETE  Comparison:  12/13/2011.  Findings:  Right Kidney:  Measures 16.1 cm with uniform parenchymal echogenicity.  A 2.2 x 1.9 x 2.2 cm anechoic lesion with increased through transmission is seen in the interpolar region, consistent with a cyst.  Moderate to severe right hydronephrosis appears fairly stable.  Left Kidney:  Measures 12.6 cm with uniform parenchymal  echogenicity.  Mild hydronephrosis, stable.  Bladder:  Surgically absent.  IMPRESSION: Bilateral hydronephrosis, right greater than left, stable.  Original Report Authenticated By: Reyes Ivan, M.D.    Medications: Scheduled Meds:    . alum & mag hydroxide-simeth  5 mL Oral Daily  . antiseptic oral rinse  15 mL Mouth Rinse BID  . aspirin EC  81 mg Oral Daily  . buPROPion  150 mg Oral BID  . cefTRIAXone (ROCEPHIN)  IV  1 g Intravenous Q24H  . vitamin B-12  500 mcg Oral Daily  . gabapentin  300 mg Oral TID  . mulitivitamin with minerals  1 tablet Oral Daily  . omega-3 acid ethyl esters  1 g Oral BID  . simvastatin  20 mg Oral QHS   Continuous Infusions:    . sodium chloride 100 mL/hr at 01/28/12 0841   PRN Meds:.acetaminophen, albuterol, guaiFENesin-dextromethorphan  Assessment/Plan:  Principal Problem:  *Pyelonephritis Active Problems:  COPD (chronic obstructive pulmonary disease)  Gout  Renal failure (ARF), acute on chronic  Dehydration   #1 Klebsiella Pyelo: Continue IV antibiotics for now. Change to PO Bactrim once ready for DC.  #2 ARF: Creatinine improving. I suspect he is close to baseline. Renal US with stable bilateral hydronephrosis. Recheck labs in the am.  #3 Fever: 2/2 pyelo. Improved.  #4 Encephalopathy: 2/2 pyelo, improving.  #5 weakness: likely 2/2 acute infection. Patient interested in PT evaluation. Will order.  #6 Dispo: If afebrile overnight and feeling well, anticipate DC in 1-2 days.    LOS: 3 days   HERNANDEZ ACOSTA,Marlena Barbato [facility] Pager: [phone] 01/28/2012, 1:05 PM

## 2012-01-29 LAB — CBC
HCT: 37.2 % — ABNORMAL LOW (ref 39.0–52.0)
Hemoglobin: 12.9 g/dL — ABNORMAL LOW (ref 13.0–17.0)
MCH: 31.2 pg (ref 26.0–34.0)
MCHC: 34.7 g/dL (ref 30.0–36.0)
MCV: 89.9 fL (ref 78.0–100.0)
RDW: 14.6 % (ref 11.5–15.5)

## 2012-01-29 LAB — BASIC METABOLIC PANEL
BUN: 21 mg/dL (ref 6–23)
CO2: 21 mEq/L (ref 19–32)
Chloride: 105 mEq/L (ref 96–112)
Creatinine, Ser: 1.17 mg/dL (ref 0.50–1.35)
GFR calc Af Amer: 67 mL/min — ABNORMAL LOW (ref >90)
Glucose, Bld: 116 mg/dL — ABNORMAL HIGH (ref 70–99)
Potassium: 3.4 mEq/L — ABNORMAL LOW (ref 3.5–5.1)

## 2012-01-29 MED ORDER — ONDANSETRON HCL 4 MG/2ML IJ SOLN
INTRAMUSCULAR | Status: AC
  Filled 2012-01-29: qty 2

## 2012-01-29 MED ORDER — SULFAMETHOXAZOLE-TMP DS 800-160 MG PO TABS
1.0000 | ORAL_TABLET | Freq: Two times a day (BID) | ORAL | Status: AC
  Administered 2012-01-29 – 2012-01-31 (×6): 1 via ORAL
  Filled 2012-01-29 (×6): qty 1

## 2012-01-29 MED ORDER — ONDANSETRON HCL 4 MG/2ML IJ SOLN
4.0000 mg | Freq: Four times a day (QID) | INTRAMUSCULAR | Status: DC | PRN
  Administered 2012-01-29 – 2012-01-31 (×3): 4 mg via INTRAVENOUS
  Filled 2012-01-29 (×2): qty 2

## 2012-01-29 NOTE — Plan of Care (Signed)
Problem: Phase II Progression Outcomes Goal: Progress activity as tolerated unless otherwise ordered Outcome: Completed/Met Date Met:  01/29/12 Patient reports that he ambulated with wife in hall on 01/28/12

## 2012-01-29 NOTE — Progress Notes (Addendum)
Physical Therapy Treatment Patient Details Name: Michael Novak MRN: [id number] DOB: [birth year] Today's Date: 01/29/2012  PT Assessment/Plan  PT - Assessment/Plan Comments on Treatment Session: Pt progressing; would certainly need 24hr supervision for safety at this time; pt reports still feeling much weaker than normal but getting better; O2 sats 96-97% on RA; pt had removed O2 prior to PT arrival. HR 72 after amb.  O2 left off and NT notified PT Plan: Discharge plan remains appropriate;Frequency remains appropriate PT Frequency: Min 3X/week Recommendations for Other Services: OT consult Follow Up Recommendations:  (pt refuses HHPT) Equipment Recommended: None recommended by PT PT Goals  Acute Rehab PT Goals Pt will go Supine/Side to Sit: Independently PT Goal: Supine/Side to Sit - Progress: Progressing toward goal Pt will go Sit to Supine/Side: Independently PT Goal: Sit to Supine/Side - Progress: Progressing toward goal Pt will go Sit to Stand: Independently PT Goal: Sit to Stand - Progress: Progressing toward goal Pt will Ambulate: >150 feet;with modified independence;with least restrictive assistive device PT Goal: Ambulate - Progress: Progressing toward goal  PT Treatment Precautions/Restrictions  Precautions Precautions: Fall Restrictions Weight Bearing Restrictions: No Mobility (including Balance) Bed Mobility Bed Mobility: Yes Sit to Supine: 4: Min assist;3: Mod assist;HOB elevated (comment degrees) (30*) Sit to Supine - Details (indicate cue type and reason): assist with LEs, difficulty and pain with getting lifting LEs Transfers Sit to Stand: 4: Min assist;5: Supervision;From chair/3-in-1;With upper extremity assist;With armrests Sit to Stand Details (indicate cue type and reason): min/guard for balance during transitionto RW; cues for hands Stand to Sit: 4: Min assist;To bed;With upper extremity assist;5: Supervision Stand to Sit Details: min/guard to control descent;  cues for hand placement Ambulation/Gait Ambulation/Gait Assistance: 4: Min assist;5: Supervision Ambulation/Gait Assistance Details (indicate cue type and reason): cues for posture and breathing and RW proximity from self; min/guard for safety Ambulation Distance (Feet): 85 Feet Assistive device: Rolling walker Gait Pattern: Decreased step length - right;Decreased step length - left Gait velocity: decreased because of feet pain Stairs: No  Posture/Postural Control Posture/Postural Control: No significant limitations Exercise  General Exercises - Lower Extremity Ankle Circles/Pumps: AROM;Both;15 reps Quad Sets: AROM;Both;10 reps Other Exercises Other Exercises: pt/wife discussed core strengthening; wife reports she will make sure pt complies at home End of Session PT - End of Session Equipment Utilized During Treatment: Gait belt Activity Tolerance: Patient tolerated treatment well Patient left: in bed;with call bell in reach;with family/visitor present Nurse Communication: Mobility status for ambulation General Behavior During Session: [facility] for tasks performed Cognition: [facility] for tasks performed  [facility] 01/29/2012, 3:04 PM

## 2012-01-29 NOTE — Progress Notes (Signed)
Subjective: Doesn't feel good today. Still feels weak. Has liquid stools in his colostomy; this is not usual for him.  Objective: Vital signs in last 24 hours: Temp:  [97.6 F (36.4 C)-98.6 F (37 C)] 97.6 F (36.4 C) (03/02 1312) Pulse Rate:  [56-70] 67  (03/02 1312) Resp:  [18-20] 18  (03/02 1312) BP: (156-165)/(80-94) 165/94 mmHg (03/02 1312) SpO2:  [93 %-95 %] 95 % (03/02 1312) Weight change:  Last BM Date: 01/29/12  Intake/Output from previous day: 03/01 0701 - 03/02 0700 In: 3653.6 [P.O.:340; I.V.:3313.6] Out: 1875 [Urine:1875] Total I/O In: 679 [P.O.:120; I.V.:559] Out: 400 [Urine:400]   Physical Exam: General: Alert, awake, oriented x3. HEENT: No bruits, no goiter. Heart: Regular rate and rhythm, without murmurs, rubs, gallops. Lungs: Clear to auscultation bilaterally. Abdomen: Soft, nontender, nondistended, positive bowel sounds. Extremities: No clubbing cyanosis or edema with positive pedal pulses.    Lab Results: Basic Metabolic Panel:  Basename 01/29/12 0353 01/27/12 0330  NA 137 137  K 3.4* 3.9  CL 105 106  CO2 21 23  GLUCOSE 116* 95  BUN 21 26*  CREATININE 1.17 1.69*  CALCIUM 8.8 8.6  MG -- --  PHOS -- --   Liver Function Tests:  Basename 01/27/12 0330  AST 13  ALT 11  ALKPHOS 41  BILITOT 0.4  PROT 6.0  ALBUMIN 2.7*    Basename 01/26/12 1705  AMMONIA 23   CBC:  Basename 01/29/12 0353 01/27/12 0330  WBC 6.6 7.1  NEUTROABS -- --  HGB 12.9* 12.2*  HCT 37.2* 36.1*  MCV 89.9 91.9  PLT 161 133*   Urine Drug Screen: Drugs of Abuse     Component Value Date/Time   LABOPIA NONE DETECTED 01/26/2012 2013   COCAINSCRNUR NONE DETECTED 01/26/2012 2013   LABBENZ NONE DETECTED 01/26/2012 2013   AMPHETMU NONE DETECTED 01/26/2012 2013   THCU NONE DETECTED 01/26/2012 2013   LABBARB NONE DETECTED 01/26/2012 2013    Alcohol Level:  Basename 01/26/12 1705  ETH <11     Recent Results (from the past 240 hour(s))  URINE CULTURE     Status:  Normal   Collection Time   01/25/12  5:49 AM      Component Value Range Status Comment   Specimen Description URINE, RANDOM   Final    Special Requests NONE   Final    Culture  Setup Time [id number]   Final    Colony Count 90,000 COLONIES/ML   Final    Culture KLEBSIELLA PNEUMONIAE   Final    Report Status 01/26/2012 FINAL   Final    Organism ID, Bacteria KLEBSIELLA PNEUMONIAE   Final   CULTURE, BLOOD (ROUTINE X 2)     Status: Normal (Preliminary result)   Collection Time   01/26/12  4:55 PM      Component Value Range Status Comment   Specimen Description BLOOD LEFT HAND   Final    Special Requests BOTTLES DRAWN AEROBIC AND ANAEROBIC 10CC   Final    Culture  Setup Time [id number]   Final    Culture     Final    Value:        BLOOD CULTURE RECEIVED NO GROWTH TO DATE CULTURE WILL BE HELD FOR 5 DAYS BEFORE ISSUING A FINAL NEGATIVE REPORT   Report Status PENDING   Incomplete   CULTURE, BLOOD (ROUTINE X 2)     Status: Normal (Preliminary result)   Collection Time   01/26/12  5:05 PM  Component Value Range Status Comment   Specimen Description BLOOD LEFT ARM   Final    Special Requests BOTTLES DRAWN AEROBIC AND ANAEROBIC 10CC   Final    Culture  Setup Time [id number]   Final    Culture     Final    Value:        BLOOD CULTURE RECEIVED NO GROWTH TO DATE CULTURE WILL BE HELD FOR 5 DAYS BEFORE ISSUING A FINAL NEGATIVE REPORT   Report Status PENDING   Incomplete     Studies/Results: No results found.  Medications: Scheduled Meds:    . alum & mag hydroxide-simeth  5 mL Oral Daily  . antiseptic oral rinse  15 mL Mouth Rinse BID  . aspirin EC  81 mg Oral Daily  . buPROPion  150 mg Oral BID  . cefTRIAXone (ROCEPHIN)  IV  1 g Intravenous Q24H  . vitamin B-12  500 mcg Oral Daily  . gabapentin  300 mg Oral TID  . mulitivitamin with minerals  1 tablet Oral Daily  . omega-3 acid ethyl esters  1 g Oral BID  . simvastatin  20 mg Oral QHS   Continuous Infusions:    . sodium  chloride 1,000 mL (01/29/12 0530)   PRN Meds:.acetaminophen, albuterol, guaiFENesin-dextromethorphan  Assessment/Plan:  Principal Problem:  *Pyelonephritis Active Problems:  COPD (chronic obstructive pulmonary disease)  Gout  Renal failure (ARF), acute on chronic  Dehydration   #1 Klebsiella Pyelo: Day 5/7 of antibiotics. Change to PO Bactrim.  #2 ARF: Creatinine improving. I suspect he is close to baseline. Renal US with stable bilateral hydronephrosis. Recheck labs in the am.  #3 Fever: 2/2 pyelo. Resolved.  #4 Encephalopathy: 2/2 pyelo, resolved.  #5 weakness: likely 2/2 acute infection. Still waiting on PT eval for DC recs.  #6 Diarrhea: ?antibiotic associated vs c. Diff. Check c diff PCR, change to PO abx. Follow.  #7 Dispo: If afebrile overnight and feeling well, anticipate DC in 1-2 days, pending PT eval.    LOS: 4 days   HERNANDEZ ACOSTA,Salina Stanfield [facility] Pager: [phone] 01/29/2012, 2:32 PM

## 2012-01-30 LAB — BASIC METABOLIC PANEL
BUN: 19 mg/dL (ref 6–23)
Calcium: 8.1 mg/dL — ABNORMAL LOW (ref 8.4–10.5)
Chloride: 106 mEq/L (ref 96–112)
Creatinine, Ser: 1.23 mg/dL (ref 0.50–1.35)
GFR calc Af Amer: 63 mL/min — ABNORMAL LOW (ref >90)

## 2012-01-30 LAB — CBC
HCT: 33.4 % — ABNORMAL LOW (ref 39.0–52.0)
MCH: 30.3 pg (ref 26.0–34.0)
MCHC: 33.8 g/dL (ref 30.0–36.0)
MCV: 89.5 fL (ref 78.0–100.0)
Platelets: 172 10*3/uL (ref 150–400)
RDW: 14.6 % (ref 11.5–15.5)
WBC: 4.5 10*3/uL (ref 4.0–10.5)

## 2012-01-30 LAB — MAGNESIUM: Magnesium: 1.9 mg/dL (ref 1.5–2.5)

## 2012-01-30 MED ORDER — POTASSIUM CHLORIDE CRYS ER 20 MEQ PO TBCR
40.0000 meq | EXTENDED_RELEASE_TABLET | ORAL | Status: AC
  Administered 2012-01-30 (×2): 40 meq via ORAL
  Filled 2012-01-30 (×2): qty 2

## 2012-01-30 NOTE — Progress Notes (Signed)
Subjective: Still weak but improving. Still has liquid stools in his ostomy bag.  Objective: Vital signs in last 24 hours: Temp:  [98.9 F (37.2 C)-100 F (37.8 C)] 98.9 F (37.2 C) (03/03 1300) Pulse Rate:  [57-74] 57  (03/03 1300) Resp:  [18-20] 20  (03/03 1300) BP: (130-143)/(63-72) 130/69 mmHg (03/03 1300) SpO2:  [94 %-96 %] 95 % (03/03 1300) Weight change:  Last BM Date: 01/29/12  Intake/Output from previous day: 03/02 0701 - 03/03 0700 In: 2519 [P.O.:360; I.V.:2159] Out: 2150 [Urine:2150] Total I/O In: 1356 [P.O.:600; I.V.:756] Out: 650 [Urine:600; Stool:50]   Physical Exam: General: Alert, awake, oriented x3. HEENT: No bruits, no goiter. Heart: Regular rate and rhythm, without murmurs, rubs, gallops. Lungs: Clear to auscultation bilaterally. Abdomen: Soft, nontender, nondistended, positive bowel sounds. Extremities: No clubbing cyanosis or edema with positive pedal pulses.    Lab Results: Basic Metabolic Panel:  Basename 01/30/12 0425 01/29/12 0353  NA 138 137  K 2.9* 3.4*  CL 106 105  CO2 25 21  GLUCOSE 91 116*  BUN 19 21  CREATININE 1.23 1.17  CALCIUM 8.1* 8.8  MG -- --  PHOS -- --   CBC:  Basename 01/30/12 0425 01/29/12 0353  WBC 4.5 6.6  NEUTROABS -- --  HGB 11.3* 12.9*  HCT 33.4* 37.2*  MCV 89.5 89.9  PLT 172 161   Urine Drug Screen: Drugs of Abuse     Component Value Date/Time   LABOPIA NONE DETECTED 01/26/2012 2013   COCAINSCRNUR NONE DETECTED 01/26/2012 2013   LABBENZ NONE DETECTED 01/26/2012 2013   AMPHETMU NONE DETECTED 01/26/2012 2013   THCU NONE DETECTED 01/26/2012 2013   LABBARB NONE DETECTED 01/26/2012 2013      Recent Results (from the past 240 hour(s))  URINE CULTURE     Status: Normal   Collection Time   01/25/12  5:49 AM      Component Value Range Status Comment   Specimen Description URINE, RANDOM   Final    Special Requests NONE   Final    Culture  Setup Time [id number]   Final    Colony Count 90,000 COLONIES/ML    Final    Culture KLEBSIELLA PNEUMONIAE   Final    Report Status 01/26/2012 FINAL   Final    Organism ID, Bacteria KLEBSIELLA PNEUMONIAE   Final   CULTURE, BLOOD (ROUTINE X 2)     Status: Normal (Preliminary result)   Collection Time   01/26/12  4:55 PM      Component Value Range Status Comment   Specimen Description BLOOD LEFT HAND   Final    Special Requests BOTTLES DRAWN AEROBIC AND ANAEROBIC 10CC   Final    Culture  Setup Time [id number]   Final    Culture     Final    Value:        BLOOD CULTURE RECEIVED NO GROWTH TO DATE CULTURE WILL BE HELD FOR 5 DAYS BEFORE ISSUING A FINAL NEGATIVE REPORT   Report Status PENDING   Incomplete   CULTURE, BLOOD (ROUTINE X 2)     Status: Normal (Preliminary result)   Collection Time   01/26/12  5:05 PM      Component Value Range Status Comment   Specimen Description BLOOD LEFT ARM   Final    Special Requests BOTTLES DRAWN AEROBIC AND ANAEROBIC 10CC   Final    Culture  Setup Time [id number]   Final    Culture     Final  Value:        BLOOD CULTURE RECEIVED NO GROWTH TO DATE CULTURE WILL BE HELD FOR 5 DAYS BEFORE ISSUING A FINAL NEGATIVE REPORT   Report Status PENDING   Incomplete   CLOSTRIDIUM DIFFICILE BY PCR     Status: Normal   Collection Time   01/29/12  4:31 PM      Component Value Range Status Comment   C difficile by pcr NEGATIVE  NEGATIVE  Final     Studies/Results: No results found.  Medications: Scheduled Meds:    . alum & mag hydroxide-simeth  5 mL Oral Daily  . antiseptic oral rinse  15 mL Mouth Rinse BID  . aspirin EC  81 mg Oral Daily  . buPROPion  150 mg Oral BID  . vitamin B-12  500 mcg Oral Daily  . gabapentin  300 mg Oral TID  . mulitivitamin with minerals  1 tablet Oral Daily  . omega-3 acid ethyl esters  1 g Oral BID  . ondansetron      . potassium chloride  40 mEq Oral Q4H  . simvastatin  20 mg Oral QHS  . sulfamethoxazole-trimethoprim  1 tablet Oral Q12H   Continuous Infusions:    . sodium chloride 1,000  mL (01/30/12 1250)   PRN Meds:.acetaminophen, albuterol, guaiFENesin-dextromethorphan, ondansetron  Assessment/Plan:  Principal Problem:  *Pyelonephritis Active Problems:  COPD (chronic obstructive pulmonary disease)  Gout  Renal failure (ARF), acute on chronic  Dehydration  Hypokalemia  #1 Klebsiella Pyelo: Day 6/7 of antibiotics. Change to PO Bactrim.  #2 ARF: Creatinine improving. I suspect he is close to baseline. Renal US with stable bilateral hydronephrosis. Recheck labs in the am.  #3 Fever: 2/2 pyelo. Resolved.  #4 Encephalopathy: 2/2 pyelo, resolved.  #5 weakness: likely 2/2 acute infection. Refuses HHPT.  #6 Diarrhea: c diff negative. ?viral gastroenteritis vs antibiotic-associated diarrhea.  #7 Hypokalemia: Related to diarrhea. Replete orally and check mag level.  #7 Dispo: If afebrile overnight and feeling well, anticipate DC in 1-2 days.   LOS: 5 days   [facility] Pager: [phone] 01/30/2012, 3:16 PM

## 2012-01-31 LAB — BASIC METABOLIC PANEL
BUN: 16 mg/dL (ref 6–23)
Calcium: 8.5 mg/dL (ref 8.4–10.5)
Chloride: 105 mEq/L (ref 96–112)
Creatinine, Ser: 1.21 mg/dL (ref 0.50–1.35)
GFR calc Af Amer: 64 mL/min — ABNORMAL LOW (ref >90)

## 2012-01-31 LAB — CBC
HCT: 32.3 % — ABNORMAL LOW (ref 39.0–52.0)
MCHC: 34.7 g/dL (ref 30.0–36.0)
MCV: 89.5 fL (ref 78.0–100.0)
Platelets: 178 10*3/uL (ref 150–400)
RDW: 14.5 % (ref 11.5–15.5)
WBC: 4.3 10*3/uL (ref 4.0–10.5)

## 2012-01-31 MED ORDER — INDOMETHACIN ER 75 MG PO CPCR
75.0000 mg | ORAL_CAPSULE | Freq: Two times a day (BID) | ORAL | Status: DC
  Administered 2012-02-01: 75 mg via ORAL
  Filled 2012-01-31 (×3): qty 1

## 2012-01-31 NOTE — Progress Notes (Signed)
. Subjective: Still weak but improving. Stools are now the consistency of apple sauce instead of liquid. C/o bilateral big toe pain; has h/o gout.  Objective: Vital signs in last 24 hours: Temp:  [98 F (36.7 C)-98.9 F (37.2 C)] 98 F (36.7 C) (03/04 1422) Pulse Rate:  [55-61] 58  (03/04 1422) Resp:  [20] 20  (03/04 1422) BP: (122-159)/(77-86) 122/77 mmHg (03/04 1422) SpO2:  [92 %-96 %] 92 % (03/04 1422) Weight change:  Last BM Date: 01/30/12  Intake/Output from previous day: 03/03 0701 - 03/04 0700 In: 2717.7 [P.O.:1040; I.V.:1677.7] Out: 2700 [Urine:2300; Stool:400] Total I/O In: 1001 [P.O.:240; I.V.:761] Out: 1000 [Urine:1000]   Physical Exam: General: Alert, awake, oriented x3. HEENT: No bruits, no goiter. Heart: Regular rate and rhythm, without murmurs, rubs, gallops. Lungs: Clear to auscultation bilaterally. Abdomen: Soft, nontender, nondistended, positive bowel sounds. Extremities: No clubbing cyanosis or edema with positive pedal pulses.    Lab Results: Basic Metabolic Panel:  Basename 01/31/12 0358 01/30/12 0425  NA 137 138  K 3.5 2.9*  CL 105 106  CO2 25 25  GLUCOSE 89 91  BUN 16 19  CREATININE 1.21 1.23  CALCIUM 8.5 8.1*  MG -- 1.9  PHOS -- --   CBC:  Basename 01/31/12 0358 01/30/12 0425  WBC 4.3 4.5  NEUTROABS -- --  HGB 11.2* 11.3*  HCT 32.3* 33.4*  MCV 89.5 89.5  PLT 178 172   Urine Drug Screen: Drugs of Abuse     Component Value Date/Time   LABOPIA NONE DETECTED 01/26/2012 2013   COCAINSCRNUR NONE DETECTED 01/26/2012 2013   LABBENZ NONE DETECTED 01/26/2012 2013   AMPHETMU NONE DETECTED 01/26/2012 2013   THCU NONE DETECTED 01/26/2012 2013   LABBARB NONE DETECTED 01/26/2012 2013      Recent Results (from the past 240 hour(s))  URINE CULTURE     Status: Normal   Collection Time   01/25/12  5:49 AM      Component Value Range Status Comment   Specimen Description URINE, RANDOM   Final    Special Requests NONE   Final    Culture   Setup Time [id number]   Final    Colony Count 90,000 COLONIES/ML   Final    Culture KLEBSIELLA PNEUMONIAE   Final    Report Status 01/26/2012 FINAL   Final    Organism ID, Bacteria KLEBSIELLA PNEUMONIAE   Final   CULTURE, BLOOD (ROUTINE X 2)     Status: Normal (Preliminary result)   Collection Time   01/26/12  4:55 PM      Component Value Range Status Comment   Specimen Description BLOOD LEFT HAND   Final    Special Requests BOTTLES DRAWN AEROBIC AND ANAEROBIC 10CC   Final    Culture  Setup Time [id number]   Final    Culture     Final    Value:        BLOOD CULTURE RECEIVED NO GROWTH TO DATE CULTURE WILL BE HELD FOR 5 DAYS BEFORE ISSUING A FINAL NEGATIVE REPORT   Report Status PENDING   Incomplete   CULTURE, BLOOD (ROUTINE X 2)     Status: Normal (Preliminary result)   Collection Time   01/26/12  5:05 PM      Component Value Range Status Comment   Specimen Description BLOOD LEFT ARM   Final    Special Requests BOTTLES DRAWN AEROBIC AND ANAEROBIC 10CC   Final    Culture  Setup Time [id number]   Final  Culture     Final    Value:        BLOOD CULTURE RECEIVED NO GROWTH TO DATE CULTURE WILL BE HELD FOR 5 DAYS BEFORE ISSUING A FINAL NEGATIVE REPORT   Report Status PENDING   Incomplete   CLOSTRIDIUM DIFFICILE BY PCR     Status: Normal   Collection Time   01/29/12  4:31 PM      Component Value Range Status Comment   C difficile by pcr NEGATIVE  NEGATIVE  Final     Studies/Results: No results found.  Medications: Scheduled Meds:    . alum & mag hydroxide-simeth  5 mL Oral Daily  . antiseptic oral rinse  15 mL Mouth Rinse BID  . aspirin EC  81 mg Oral Daily  . buPROPion  150 mg Oral BID  . vitamin B-12  500 mcg Oral Daily  . gabapentin  300 mg Oral TID  . indomethacin  75 mg Oral BID WC  . mulitivitamin with minerals  1 tablet Oral Daily  . omega-3 acid ethyl esters  1 g Oral BID  . potassium chloride  40 mEq Oral Q4H  . simvastatin  20 mg Oral QHS  .  sulfamethoxazole-trimethoprim  1 tablet Oral Q12H   Continuous Infusions:    . sodium chloride 100 mL/hr (01/31/12 0920)   PRN Meds:.acetaminophen, albuterol, guaiFENesin-dextromethorphan, ondansetron  Assessment/Plan:  Principal Problem:  *Pyelonephritis Active Problems:  COPD (chronic obstructive pulmonary disease)  Gout  Renal failure (ARF), acute on chronic  Dehydration  Hypokalemia  #1 Klebsiella Pyelo: Day 7/7 of antibiotics.   #2 ARF: Creatinine improving. I suspect he is close to baseline. Renal US with stable bilateral hydronephrosis. Recheck labs in the am.  #3 Fever: 2/2 pyelo. Resolved.  #4 Encephalopathy: 2/2 pyelo, resolved.  #5 weakness: likely 2/2 acute infection. Refuses HHPT.  #6 Diarrhea: c diff negative. ?viral gastroenteritis vs antibiotic-associated diarrhea. Improving.  #7 Hypokalemia: Repleted.  #8 Gout: Start indomethacin.  #9 Dispo: Discharge home in am.   LOS: 6 days   HERNANDEZ ACOSTA,Chalee Hirota [facility] Pager: [phone] 01/31/2012, 4:48 PM

## 2012-01-31 NOTE — Progress Notes (Signed)
Physical Therapy Treatment Patient Details Name: Michael Novak MRN: [id number] DOB: Mar 06, 1930 Today's Date: 01/31/2012  PT Assessment/Plan  PT - Assessment/Plan Comments on Treatment Session: Continues improvement.   PT Plan: Discharge plan remains appropriate;Frequency remains appropriate PT Frequency: Min 3X/week Follow Up Recommendations: No PT follow up Equipment Recommended: None recommended by PT PT Goals  Acute Rehab PT Goals PT Goal: Sit to Stand - Progress: Progressing toward goal PT Goal: Ambulate - Progress: Progressing toward goal  PT Treatment Precautions/Restrictions  Precautions Precautions: Fall Required Braces or Orthoses: No Restrictions Weight Bearing Restrictions: No Mobility (including Balance) Bed Mobility Bed Mobility: No Sit to Supine - Details (indicate cue type and reason): pt up in chair per nursing Transfers Transfers: Yes Sit to Stand: 5: Supervision;From chair/3-in-1 Sit to Stand Details (indicate cue type and reason): cues to push up with arms, but pt able to repeat activity > 5 times fro strengthening Stand to Sit: 5: Supervision Ambulation/Gait Ambulation/Gait: Yes Ambulation/Gait Assistance: 5: Supervision Ambulation/Gait Assistance Details (indicate cue type and reason): using RW, better step length and swing phase, but still c/o pain in feet from "gout" Ambulation Distance (Feet): 125 Feet Assistive device: Rolling walker Gait Pattern: Step-through pattern Gait velocity: decreased because of feet pain Stairs: No Wheelchair Mobility Wheelchair Mobility: No  Posture/Postural Control Posture/Postural Control: No significant limitations Balance Balance Assessed: No Exercise    End of Session PT - End of Session Equipment Utilized During Treatment: Gait belt Activity Tolerance: Patient limited by fatigue Patient left: in chair;with call bell in reach Nurse Communication: Mobility status for ambulation General Behavior During  Session: [facility] for tasks performed Cognition: [facility] for tasks performed  Donnetta Hail 01/31/2012, 12:18 PM

## 2012-01-31 NOTE — Evaluation (Signed)
Physical Therapy Evaluation Patient Details Name: Michael Novak MRN: [id number] DOB: 1930-02-10 Today's Date: 01/31/2012  Problem List:  Patient Active Problem List  Diagnoses  . Wears dentures/gum disease  . Prostate cancer  . Hearing loss  . Contact lens/glasses fitting  . Parastomal hernia at left colostomy  . Bradycardia, HR 30-40s, Nov-Dec2012  . COPD (chronic obstructive pulmonary disease)  . Pyelonephritis  . Dyslipidemia  . Gout attack  . Gout  . Renal failure (ARF), acute on chronic  . Dehydration    Past Medical History:  Past Medical History  Diagnosis Date  . History of colostomy   . Urostomy stenosis     Ask patient to clarify, per history form dated 04/02/11.  Marland Kitchen Peristomal hernia   . Prostate cancer   . Skin cancer    Past Surgical History:  Past Surgical History  Procedure Date  . Cystectomy, ileal coduit, colostomy 2002    for severe rectourethral fistula  . Ex lap, lysis of adhesions 2010    for SBO  . Lap repair of parastomal hernias 2009    Dr. Barnetta Chapel [facility],  Hematoma evac POD#1    PT Assessment/Plan/Recommendation PT Assessment PT Recommendation/Assessment: Patient will need skilled PT in the acute care venue PT Problem List: Pain;Decreased strength;Decreased activity tolerance;Decreased knowledge of use of DME PT Therapy Diagnosis : Difficulty walking;Generalized weakness;Acute pain PT Plan PT Frequency: Min 3X/week PT Treatment/Interventions: DME instruction;Gait training;Stair training;Functional mobility training;Therapeutic exercise;Patient/family education PT Recommendation Recommendations for Other Services: OT consult Follow Up Recommendations: Home health PT;Other (comment) (pt refused...states he has had it before ) Equipment Recommended: None recommended by PT PT Goals  Acute Rehab PT Goals PT Goal Formulation: With patient/family Time For Goal Achievement: 2 weeks Pt will go Supine/Side to Sit: Independently Pt will go Sit to  Supine/Side: Independently Pt will go Sit to Stand: Independently Pt will Ambulate: >150 feet;with modified independence;with least restrictive assistive device Pt will Go Up / Down Stairs: 1-2 stairs;with supervision;with least restrictive assistive device  PT Evaluation Precautions/Restrictions  Restrictions Weight Bearing Restrictions: No Prior Functioning  Home Living Lives With: Spouse Receives Help From: Family Type of Home: House Home Layout: One level Home Access: Stairs to enter Entrance Stairs-Rails: None Entrance Stairs-Number of Steps: 1 Home Adaptive Equipment: Walker - rolling Prior Function Level of Independence: Independent with gait;Independent with transfers Able to Take Stairs?: Yes Cognition Cognition Arousal/Alertness: Awake/alert Overall Cognitive Status: Appears within functional limits for tasks assessed Orientation Level: Oriented X4 Sensation/Coordination Coordination Gross Motor Movements are Fluid and Coordinated: Yes Fine Motor Movements are Fluid and Coordinated: Yes Extremity Assessment RLE Assessment RLE Assessment: Within Functional Limits LLE Assessment LLE Assessment: Within Functional Limits Mobility (including Balance) Bed Mobility Bed Mobility: Yes Sit to Supine: 4: Min assist Transfers Transfers: Yes Sit to Stand: 5: Supervision;From elevated surface;With upper extremity assist Stand to Sit: 5: Supervision Ambulation/Gait Ambulation/Gait: Yes Ambulation/Gait Assistance: 5: Supervision Assistive device: Rolling walker Gait Pattern: Step-through pattern Gait velocity: decreased because of feet pain Stairs: No Wheelchair Mobility Wheelchair Mobility: No  Posture/Postural Control Posture/Postural Control: No significant limitations Balance Balance Assessed: No Exercise  Other Exercises Other Exercises: pt issued home ex program handout for core and LE strengthening.  Pt able to demonstrate. Copy lefft in shadow chart End of  Session PT - End of Session Equipment Utilized During Treatment: Gait belt Activity Tolerance: Patient limited by fatigue Patient left: in bed;with family/visitor present Nurse Communication: Mobility status for ambulation General Behavior During Session: [facility] for  tasks performed Cognition: [facility] for tasks performed  Donnetta Hail 01/28/2012, 8:52 AM

## 2012-02-01 MED ORDER — INDOMETHACIN ER 75 MG PO CPCR: 75.0000 mg | ORAL_CAPSULE | Freq: Two times a day (BID) | ORAL | Status: AC

## 2012-02-01 NOTE — Progress Notes (Signed)
Patient d/c at this time, stable.

## 2012-02-01 NOTE — Discharge Summary (Signed)
Physician Discharge Summary  Patient ID: Michael Novak MRN: [id number] DOB/AGE: 06/15/1930 78 y.o.  Admit date: 01/25/2012 Discharge date: 02/01/2012  Primary Care Physician:  Kimber Relic, MD, MD   Discharge Diagnoses:    Principal Problem:  *Pyelonephritis Active Problems:  COPD (chronic obstructive pulmonary disease)  Gout  Renal failure (ARF), acute on chronic  Dehydration    Medication List  As of 02/01/2012 10:01 AM   STOP taking these medications         ciprofloxacin 500 MG tablet         TAKE these medications         aspirin 81 MG tablet   Take 81 mg by mouth daily. Ask patient to clarify dosage. Not indicated on history form dated 04/02/11.      buPROPion 150 MG 12 hr tablet   Commonly known as: WELLBUTRIN SR   Take 150 mg by mouth 2 (two) times daily.      calcium & magnesium carbonates 311-232 MG per tablet   Commonly known as: MYLANTA   Take 1 tablet by mouth daily.      gabapentin 300 MG capsule   Commonly known as: NEURONTIN   Take 300 mg by mouth 3 (three) times daily.      glucosamine-chondroitin 500-400 MG tablet   Take 1 tablet by mouth 3 (three) times daily.      indomethacin 75 MG CR capsule   Commonly known as: INDOCIN SR   Take 1 capsule (75 mg total) by mouth 2 (two) times daily with a meal.      multivitamin tablet   Take 1 tablet by mouth daily.      nitrofurantoin 50 MG capsule   Commonly known as: MACRODANTIN   Take 50 mg by mouth at bedtime.      omega-3 acid ethyl esters 1 G capsule   Commonly known as: LOVAZA   Take 1 g by mouth 2 (two) times daily.      simvastatin 20 MG tablet   Commonly known as: ZOCOR   Take 20 mg by mouth at bedtime.      vitamin B-12 500 MCG tablet   Commonly known as: CYANOCOBALAMIN   Take 500 mcg by mouth daily.             Disposition and Follow-up:  Will be discharged home today in stable and improved condition. Has been instructed to followup with his PCP in 3 weeks.  Consults:   None    Significant Diagnostic Studies:  Dg Chest Port 1 View  01/25/2012  *RADIOLOGY REPORT*  Clinical Data: Shortness of breath, altered mental status  PORTABLE CHEST - 1 VIEW  Comparison: Portable chest x-ray of 06/15/2011  Findings: The lungs are poorly aerated.  There is cardiomegaly present with apparent moderate pulmonary vascular congestion. There may be small effusions, findings most consistent with congestive heart failure.  Two-view chest x-ray would be helpful. No bony abnormality is seen.  IMPRESSION: Poor inspiration and probable mild CHF.  Recommend two-view chest x- ray if possible.  Original Report Authenticated By: Juline Patch, M.D.    Brief H and P: For complete details please refer to admission H and P, but in brief patient is a 78 year old man with history of prostate cancer status post urostomy and diverting Colostomy, who was recently admitted to the hospital for enterococcal pyelonephritis about a month ago . Patient presented to the ER today with chief complaint of nausea, vomiting and  bilateral flanks pain since yesterday which is progressively worsening. Because of this we were asked to admit him for further evaluation and management.     Hospital Course:  Principal Problem:  *Pyelonephritis Active Problems:  COPD (chronic obstructive pulmonary disease)  Gout  Renal failure (ARF), acute on chronic  Dehydration   #1 Klebsiella Pyelonephritis:  Has completed course of antibiotics.Is no longer febrile, does not have a white count.  #2 ARF: resolved. Renal US showed bilateral stable hydronephrosis.  #3 Metabolic Encephalopathy: 2/2 pyelo. Resolved.  #4 Gout: improved on indomethacin.  Stable for discharge home today.  Time spent on Discharge: Greater than 30 minutes.   SignedChaya Jan [facility] Pager: [phone] 02/01/2012, 10:01 AM

## 2012-02-01 NOTE — Progress Notes (Signed)
D/c instructions given, patient verbalized understanding, prescription given, stable, denies pain

## 2012-02-02 LAB — CULTURE, BLOOD (ROUTINE X 2)
Culture  Setup Time: 201302280118
Culture: NO GROWTH
Culture: NO GROWTH

## 2012-02-11 ENCOUNTER — Other Ambulatory Visit: Payer: Self-pay

## 2012-02-11 DIAGNOSIS — I714 Abdominal aortic aneurysm, without rupture: Secondary | ICD-10-CM

## 2012-02-29 DIAGNOSIS — L539 Erythematous condition, unspecified: Secondary | ICD-10-CM

## 2012-02-29 DIAGNOSIS — L210 Seborrhea capitis: Secondary | ICD-10-CM

## 2012-02-29 HISTORY — DX: Seborrhea capitis: L21.0

## 2012-02-29 HISTORY — DX: Erythematous condition, unspecified: L53.9

## 2012-04-10 ENCOUNTER — Encounter: Payer: Self-pay | Admitting: Vascular Surgery

## 2012-04-11 ENCOUNTER — Encounter: Payer: Self-pay | Admitting: Vascular Surgery

## 2012-04-11 ENCOUNTER — Ambulatory Visit (INDEPENDENT_AMBULATORY_CARE_PROVIDER_SITE_OTHER): Payer: Medicare Other | Admitting: Vascular Surgery

## 2012-04-11 ENCOUNTER — Encounter (INDEPENDENT_AMBULATORY_CARE_PROVIDER_SITE_OTHER): Payer: Medicare Other | Admitting: *Deleted

## 2012-04-11 VITALS — BP 157/90 | HR 67 | Resp 20 | Ht 74.0 in | Wt 218.0 lb

## 2012-04-11 DIAGNOSIS — I714 Abdominal aortic aneurysm, without rupture, unspecified: Secondary | ICD-10-CM | POA: Insufficient documentation

## 2012-04-11 NOTE — Progress Notes (Signed)
The patient presents today for followup of his infrarenal abdominal aortic aneurysm. This is an incidental finding with the workup for intra-abdominal pathology number of years ago. He had been seen at [facility] and wishes to have his aneurysm followed here. I do have a prior CT scan from July of 2012 for comparison. At that time his maximal aneurysm size was 4.7 cm. He denies any symptoms related to his aneurysm. He has had extensive prior surgery with colon resection and colonoscopy and also bladder resection. He does have a large parastomal hernia and the sac contemplating repair of this.  Past Medical History  Diagnosis Date  . History of colostomy   . Urostomy stenosis     Ask patient to clarify, per history form dated 04/02/11.  Marland Kitchen Peristomal hernia   . Prostate cancer   . Skin cancer     History  Substance Use Topics  . Smoking status: Never Smoker   . Smokeless tobacco: Never Used  . Alcohol Use: No    No family history on file.  Allergies  Allergen Reactions  . Amoxicillin-Pot Clavulanate     Current outpatient prescriptions:aspirin 81 MG tablet, Take 81 mg by mouth daily. Ask patient to clarify dosage. Not indicated on history form dated 04/02/11. , Disp: , Rfl: ;  buPROPion (WELLBUTRIN SR) 150 MG 12 hr tablet, Take 150 mg by mouth 2 (two) times daily.  , Disp: , Rfl: ;  calcium & magnesium carbonates (MYLANTA) 311-232 MG per tablet, Take 1 tablet by mouth daily.  , Disp: , Rfl:  gabapentin (NEURONTIN) 300 MG capsule, Take 300 mg by mouth 3 (three) times daily.  , Disp: , Rfl: ;  glucosamine-chondroitin 500-400 MG tablet, Take 1 tablet by mouth 3 (three) times daily., Disp: , Rfl: ;  indomethacin (INDOCIN SR) 75 MG CR capsule, Take 1 capsule (75 mg total) by mouth 2 (two) times daily with a meal., Disp: 20 capsule, Rfl: 0;  Multiple Vitamin (MULTIVITAMIN) tablet, Take 1 tablet by mouth daily.  , Disp: , Rfl:  nitrofurantoin (MACRODANTIN) 50 MG capsule, Take 50 mg by mouth at  bedtime., Disp: , Rfl: ;  omega-3 acid ethyl esters (LOVAZA) 1 G capsule, Take 1 g by mouth 2 (two) times daily., Disp: , Rfl: ;  simvastatin (ZOCOR) 20 MG tablet, Take 20 mg by mouth at bedtime.  , Disp: , Rfl: ;  vitamin B-12 (CYANOCOBALAMIN) 500 MCG tablet, Take 500 mcg by mouth daily.  , Disp: , Rfl:   BP 157/90  Pulse 67  Resp 20  Ht 6\' 2"  (1.88 m)  Wt 218 lb (98.884 kg)  BMI 27.99 kg/m2  Body mass index is 27.99 kg/(m^2).       Review of systems: Shortness of breath when lying flat. Otherwise negative except for above.  Physical exam: Well-developed well-nourished obese male in no acute distress Psychiatric alert oriented normal mood Pulmonary equal breath sounds bilateral with no rales or wheezes Heart regular rate and rhythm without murmur Abdomen obese I cannot palpate an aneurysm area does have a left lower quadrant stoma with a large parastomal hernia Extremities 2+ radial femoral and dorsalis pedis pulses without evidence of peripheral aneurysm Carotids without bruits bilaterally Musculoskeletal no major deformities or cyanosis Skin without ulcers or rashes  Abdominal aortic aneurysm duplex ultrasound today reveals maximal diameter of 4.7 cm.  Impression and plan stable infrarenal abdominal aortic aneurysm with no change from ultrasound today compared to CT scan from July 2012. I explained the  potential for slow progression in size and potential eventual elective repair. At the size that he has what would recommend at 6 month interval ultrasound to rule out enlargement. Spleen both open and stent graft elective repair. I explained that he should have new sudden onset of upper abdominal or back pain he is to contact emergency services go immediately to the emergency room. OSA will see him in our office in 6 months for continued ultrasound surveillance

## 2012-04-13 NOTE — Procedures (Unsigned)
DUPLEX ULTRASOUND OF ABDOMINAL AORTA  INDICATION:  Follow up AAA found incidentally on CT scan.  HISTORY: Diabetes:  No. Cardiac:  Yes. Hypertension:  No. Smoking:  Previous. Connective Tissue Disorder: Family History: Previous Surgery:  DUPLEX EXAM:         AP (cm)                   TRANSVERSE (cm) Proximal             2.88 cm                   2.84 cm Mid                  4.7 cm                    4.7 cm Distal               2.16 cm Right Iliac          1.21 cm                   1.21 cm Left Iliac           0.8 cm  PREVIOUS:  Date:  By CT scan  AP:  4.7  TRANSVERSE:  4.4  IMPRESSION:  Abdominal aortic aneurysm measuring approximately 4.7 cm X 4.7 cm on today's examination.  ___________________________________________ Larina Earthly, M.D.  LT/MEDQ  D:  04/11/2012  T:  04/12/2012  Job:  [id number]

## 2012-06-21 ENCOUNTER — Emergency Department (HOSPITAL_COMMUNITY): Payer: Medicare Other

## 2012-06-21 ENCOUNTER — Inpatient Hospital Stay (HOSPITAL_COMMUNITY)
Admission: EM | Admit: 2012-06-21 | Discharge: 2012-07-02 | DRG: 660 | Disposition: A | Discharge status: Home Health | Payer: Medicare Other | Attending: Internal Medicine | Admitting: Internal Medicine

## 2012-06-21 ENCOUNTER — Encounter (HOSPITAL_COMMUNITY): Payer: Self-pay | Admitting: Emergency Medicine

## 2012-06-21 DIAGNOSIS — N10 Acute tubulo-interstitial nephritis: Principal | ICD-10-CM | POA: Diagnosis present

## 2012-06-21 DIAGNOSIS — Z938 Other artificial opening status: Secondary | ICD-10-CM

## 2012-06-21 DIAGNOSIS — I714 Abdominal aortic aneurysm, without rupture, unspecified: Secondary | ICD-10-CM

## 2012-06-21 DIAGNOSIS — N12 Tubulo-interstitial nephritis, not specified as acute or chronic: Secondary | ICD-10-CM

## 2012-06-21 DIAGNOSIS — Z906 Acquired absence of other parts of urinary tract: Secondary | ICD-10-CM

## 2012-06-21 DIAGNOSIS — N179 Acute kidney failure, unspecified: Secondary | ICD-10-CM

## 2012-06-21 DIAGNOSIS — R5381 Other malaise: Secondary | ICD-10-CM

## 2012-06-21 DIAGNOSIS — J4489 Other specified chronic obstructive pulmonary disease: Secondary | ICD-10-CM | POA: Diagnosis present

## 2012-06-21 DIAGNOSIS — K567 Ileus, unspecified: Secondary | ICD-10-CM

## 2012-06-21 DIAGNOSIS — K560 Paralytic ileus: Secondary | ICD-10-CM | POA: Diagnosis not present

## 2012-06-21 DIAGNOSIS — Z933 Colostomy status: Secondary | ICD-10-CM

## 2012-06-21 DIAGNOSIS — C61 Malignant neoplasm of prostate: Secondary | ICD-10-CM

## 2012-06-21 DIAGNOSIS — W19XXXA Unspecified fall, initial encounter: Secondary | ICD-10-CM | POA: Diagnosis not present

## 2012-06-21 DIAGNOSIS — IMO0002 Reserved for concepts with insufficient information to code with codable children: Secondary | ICD-10-CM

## 2012-06-21 DIAGNOSIS — A498 Other bacterial infections of unspecified site: Secondary | ICD-10-CM | POA: Diagnosis present

## 2012-06-21 DIAGNOSIS — N1330 Unspecified hydronephrosis: Secondary | ICD-10-CM

## 2012-06-21 DIAGNOSIS — Z9049 Acquired absence of other specified parts of digestive tract: Secondary | ICD-10-CM

## 2012-06-21 DIAGNOSIS — E785 Hyperlipidemia, unspecified: Secondary | ICD-10-CM

## 2012-06-21 DIAGNOSIS — N189 Chronic kidney disease, unspecified: Secondary | ICD-10-CM

## 2012-06-21 DIAGNOSIS — J449 Chronic obstructive pulmonary disease, unspecified: Secondary | ICD-10-CM | POA: Diagnosis present

## 2012-06-21 HISTORY — DX: Depression, unspecified: F32.A

## 2012-06-21 HISTORY — DX: Polyneuropathy, unspecified: G62.9

## 2012-06-21 HISTORY — DX: Urinary tract infection, site not specified: N39.0

## 2012-06-21 HISTORY — DX: Chronic obstructive pulmonary disease, unspecified: J44.9

## 2012-06-21 HISTORY — DX: Major depressive disorder, single episode, unspecified: F32.9

## 2012-06-21 LAB — CBC WITH DIFFERENTIAL/PLATELET
Basophils Absolute: 0 10*3/uL (ref 0.0–0.1)
Basophils Relative: 0 % (ref 0–1)
Eosinophils Absolute: 0 10*3/uL (ref 0.0–0.7)
Hemoglobin: 16 g/dL (ref 13.0–17.0)
MCH: 31.3 pg (ref 26.0–34.0)
MCHC: 35 g/dL (ref 30.0–36.0)
Monocytes Relative: 11 % (ref 3–12)
Neutro Abs: 7.5 10*3/uL (ref 1.7–7.7)
Neutrophils Relative %: 81 % — ABNORMAL HIGH (ref 43–77)
RDW: 13.1 % (ref 11.5–15.5)

## 2012-06-21 LAB — URINALYSIS, ROUTINE W REFLEX MICROSCOPIC
Bilirubin Urine: NEGATIVE
Nitrite: NEGATIVE
Protein, ur: NEGATIVE mg/dL
Urobilinogen, UA: 0.2 mg/dL (ref 0.0–1.0)

## 2012-06-21 LAB — COMPREHENSIVE METABOLIC PANEL
ALT: 16 U/L (ref 0–53)
AST: 20 U/L (ref 0–37)
Alkaline Phosphatase: 51 U/L (ref 39–117)
CO2: 21 mEq/L (ref 19–32)
Calcium: 9.9 mg/dL (ref 8.4–10.5)
Chloride: 99 mEq/L (ref 96–112)
GFR calc Af Amer: 50 mL/min — ABNORMAL LOW (ref >90)
GFR calc non Af Amer: 43 mL/min — ABNORMAL LOW (ref >90)
Glucose, Bld: 130 mg/dL — ABNORMAL HIGH (ref 70–99)
Potassium: 4.6 mEq/L (ref 3.5–5.1)
Sodium: 137 mEq/L (ref 135–145)
Total Bilirubin: 0.6 mg/dL (ref 0.3–1.2)

## 2012-06-21 LAB — URINE MICROSCOPIC-ADD ON

## 2012-06-21 MED ORDER — ONDANSETRON HCL 4 MG/2ML IJ SOLN
4.0000 mg | Freq: Once | INTRAMUSCULAR | Status: AC
  Administered 2012-06-21: 4 mg via INTRAVENOUS
  Filled 2012-06-21: qty 2

## 2012-06-21 MED ORDER — MORPHINE SULFATE 4 MG/ML IJ SOLN
4.0000 mg | INTRAMUSCULAR | Status: DC | PRN
Start: 2012-06-21 — End: 2012-06-28
  Administered 2012-06-21 – 2012-06-27 (×14): 4 mg via INTRAVENOUS
  Filled 2012-06-21 (×15): qty 1

## 2012-06-21 MED ORDER — ADULT MULTIVITAMIN W/MINERALS CH
1.0000 | ORAL_TABLET | Freq: Every day | ORAL | Status: DC
  Administered 2012-06-22 – 2012-07-02 (×8): 1 via ORAL
  Filled 2012-06-21 (×11): qty 1

## 2012-06-21 MED ORDER — MORPHINE SULFATE 4 MG/ML IJ SOLN
INTRAMUSCULAR | Status: AC
  Filled 2012-06-21: qty 1

## 2012-06-21 MED ORDER — SODIUM CHLORIDE 0.9 % IV SOLN
1000.0000 mL | INTRAVENOUS | Status: DC
  Administered 2012-06-21 – 2012-06-27 (×8): 1000 mL via INTRAVENOUS

## 2012-06-21 MED ORDER — BUPROPION HCL ER (SR) 150 MG PO TB12
150.0000 mg | ORAL_TABLET | Freq: Two times a day (BID) | ORAL | Status: DC
  Administered 2012-06-22 – 2012-07-02 (×16): 150 mg via ORAL
  Filled 2012-06-21 (×23): qty 1

## 2012-06-21 MED ORDER — OMEGA-3-ACID ETHYL ESTERS 1 G PO CAPS
1.0000 g | ORAL_CAPSULE | Freq: Two times a day (BID) | ORAL | Status: DC
  Administered 2012-06-22 – 2012-07-02 (×16): 1 g via ORAL
  Filled 2012-06-21 (×23): qty 1

## 2012-06-21 MED ORDER — HEPARIN SODIUM (PORCINE) 5000 UNIT/ML IJ SOLN
5000.0000 [IU] | Freq: Three times a day (TID) | INTRAMUSCULAR | Status: DC
  Administered 2012-06-22 – 2012-07-02 (×30): 5000 [IU] via SUBCUTANEOUS
  Filled 2012-06-21 (×34): qty 1

## 2012-06-21 MED ORDER — CIPROFLOXACIN IN D5W 400 MG/200ML IV SOLN
400.0000 mg | Freq: Two times a day (BID) | INTRAVENOUS | Status: DC
  Administered 2012-06-22 – 2012-06-28 (×12): 400 mg via INTRAVENOUS
  Filled 2012-06-21 (×13): qty 200

## 2012-06-21 MED ORDER — SIMVASTATIN 20 MG PO TABS
20.0000 mg | ORAL_TABLET | Freq: Every day | ORAL | Status: DC
  Administered 2012-06-22 – 2012-07-01 (×8): 20 mg via ORAL
  Filled 2012-06-21 (×12): qty 1

## 2012-06-21 MED ORDER — ONDANSETRON HCL 4 MG/2ML IJ SOLN
4.0000 mg | Freq: Four times a day (QID) | INTRAMUSCULAR | Status: DC | PRN
  Administered 2012-06-23: 4 mg via INTRAVENOUS
  Filled 2012-06-21 (×2): qty 2

## 2012-06-21 MED ORDER — ALUM & MAG HYDROXIDE-SIMETH 200-200-20 MG/5ML PO SUSP
30.0000 mL | Freq: Every day | ORAL | Status: DC
  Administered 2012-06-22 – 2012-07-02 (×8): 30 mL via ORAL
  Filled 2012-06-21 (×8): qty 30

## 2012-06-21 MED ORDER — CIPROFLOXACIN IN D5W 400 MG/200ML IV SOLN
400.0000 mg | Freq: Once | INTRAVENOUS | Status: AC
Start: 2012-06-21 — End: 2012-06-21
  Administered 2012-06-21: 400 mg via INTRAVENOUS
  Filled 2012-06-21: qty 200

## 2012-06-21 MED ORDER — ONE-DAILY MULTI VITAMINS PO TABS: 1.0000 | ORAL_TABLET | Freq: Every day | ORAL | Status: DC

## 2012-06-21 MED ORDER — SODIUM CHLORIDE 0.9 % IV BOLUS (SEPSIS)
1000.0000 mL | Freq: Once | INTRAVENOUS | Status: AC
  Administered 2012-06-21: 1000 mL via INTRAVENOUS

## 2012-06-21 MED ORDER — CALCIUM & MAGNESIUM CARBONATES 311-232 MG PO TABS: 1.0000 | ORAL_TABLET | Freq: Every day | ORAL | Status: DC

## 2012-06-21 MED ORDER — ONDANSETRON HCL 4 MG PO TABS
4.0000 mg | ORAL_TABLET | Freq: Four times a day (QID) | ORAL | Status: DC | PRN
  Filled 2012-06-21: qty 1

## 2012-06-21 MED ORDER — GLUCOSAMINE-CHONDROITIN 500-400 MG PO TABS: 1.0000 | ORAL_TABLET | Freq: Three times a day (TID) | ORAL | Status: DC

## 2012-06-21 MED ORDER — GABAPENTIN 300 MG PO CAPS
300.0000 mg | ORAL_CAPSULE | Freq: Three times a day (TID) | ORAL | Status: DC
  Administered 2012-06-22 – 2012-07-02 (×22): 300 mg via ORAL
  Filled 2012-06-21 (×35): qty 1

## 2012-06-21 MED ORDER — ASPIRIN EC 81 MG PO TBEC
81.0000 mg | DELAYED_RELEASE_TABLET | Freq: Every day | ORAL | Status: DC
  Administered 2012-06-22 – 2012-07-02 (×8): 81 mg via ORAL
  Filled 2012-06-21 (×11): qty 1

## 2012-06-21 MED ORDER — MORPHINE SULFATE 4 MG/ML IJ SOLN
4.0000 mg | Freq: Once | INTRAMUSCULAR | Status: AC
  Administered 2012-06-21: 4 mg via INTRAVENOUS
  Filled 2012-06-21: qty 1

## 2012-06-21 MED ORDER — ASPIRIN 81 MG PO TABS: 81.0000 mg | ORAL_TABLET | Freq: Every day | ORAL | Status: DC

## 2012-06-21 MED ORDER — MORPHINE SULFATE 4 MG/ML IJ SOLN
4.0000 mg | Freq: Once | INTRAMUSCULAR | Status: AC
  Administered 2012-06-21: 4 mg via INTRAVENOUS

## 2012-06-21 NOTE — ED Provider Notes (Signed)
History     CSN: [id number]  Arrival date & time 06/21/12  1441   First MD Initiated Contact with Patient 06/21/12 1537      Chief Complaint  Patient presents with  . Flank Pain    (Consider location/radiation/quality/duration/timing/severity/associated sxs/prior treatment) HPI Pt presenting with c/o right sided flank pain.  Pt has a urostomy and colostomy in place.  He does note decreased output from his colostomy compared to his usual.  No vomiting.  Pt has not noted any change in his urine.  Denies fever/chills.  States he has not had hx of renal stone, but has had kidney infections in the past that felt similar to this.  Pain is constant, sharp and aching.  Began today at 11am.  There are no other associated systemic symptoms, there are no other alleviating or modifying factors.   Past Medical History  Diagnosis Date  . History of colostomy   . Urostomy stenosis     Ask patient to clarify, per history form dated 04/02/11.  Marland Kitchen Peristomal hernia   . Prostate cancer   . Skin cancer   . Neuropathy     in feet   . COPD (chronic obstructive pulmonary disease)   . Depression   . UTI (lower urinary tract infection)     history     Past Surgical History  Procedure Date  . Cystectomy, ileal coduit, colostomy 2002    for severe rectourethral fistula  . Ex lap, lysis of adhesions 2010    for SBO  . Lap repair of parastomal hernias 2009    Dr. Barnetta Chapel [facility],  Hematoma evac POD#1    History reviewed. No pertinent family history.  History  Substance Use Topics  . Smoking status: Former Smoker -- 37 years    Types: Cigarettes  . Smokeless tobacco: Never Used  . Alcohol Use: Yes     wine occasionally with supper       Review of Systems ROS reviewed and all otherwise negative except for mentioned in HPI  Allergies  Amoxicillin-pot clavulanate  Home Medications   No current outpatient prescriptions on file.  BP 117/66  Pulse 77  Temp 98.9 F (37.2 C) (Oral)  Resp  18  Ht 6\' 2"  (1.88 m)  Wt 218 lb 0.6 oz (98.9 kg)  BMI 27.99 kg/m2  SpO2 91% Vitals reviewed Physical Exam Physical Examination: General appearance - alert, uncomfortable appearing, and in mild distress Mental status - alert, oriented to person, place, and time Eyes - no scleral icterus Mouth - mucous membranes tacky, OP without lesions or erythema Chest - clear to auscultation, no wheezes, rales or rhonchi, symmetric air entry Heart - normal rate, regular rhythm, normal S1, S2, no murmurs, rubs, clicks or gallops Abdomen - soft, diffusely tender, nondistended, no masses or organomegaly, colostomy and urostomy sites c/d/i- pink stoma Back exam - full range of motion, right CVA tenderness, no midline spinal tenderness Extremities - peripheral pulses normal, no pedal edema, no clubbing or cyanosis Skin - normal coloration and turgor, no rashes  ED Course  Procedures (including critical care time)  8:35 PM  Discussed with [facility] hospitalist, Dr. Conley Rolls- pt to be admitted to hospitalist service for further management  Labs Reviewed  URINALYSIS, ROUTINE W REFLEX MICROSCOPIC - Abnormal; Notable for the following:    APPearance CLOUDY (*)     Hgb urine dipstick LARGE (*)     All other components within normal limits  COMPREHENSIVE METABOLIC PANEL - Abnormal; Notable for the  following:    Glucose, Bld 130 (*)     BUN 30 (*)     Creatinine, Ser 1.48 (*)     GFR calc non Af Amer 43 (*)     GFR calc Af Amer 50 (*)     All other components within normal limits  CBC WITH DIFFERENTIAL - Abnormal; Notable for the following:    Neutrophils Relative 81 (*)     Lymphocytes Relative 9 (*)     All other components within normal limits  URINE MICROSCOPIC-ADD ON - Abnormal; Notable for the following:    Bacteria, UA MANY (*)     Crystals URIC ACID CRYSTALS (*)     All other components within normal limits  BASIC METABOLIC PANEL - Abnormal; Notable for the following:    Glucose, Bld 116 (*)     BUN  29 (*)     Creatinine, Ser 1.67 (*)     GFR calc non Af Amer 37 (*)     GFR calc Af Amer 43 (*)     All other components within normal limits  LIPASE, BLOOD  CBC  URINE CULTURE  BODY FLUID CULTURE   Ct Abdomen Pelvis Wo Contrast  06/21/2012  *RADIOLOGY REPORT*  Clinical Data: Right flank pain  CT ABDOMEN AND PELVIS WITHOUT CONTRAST  Technique:  Multidetector CT imaging of the abdomen and pelvis was performed following the standard protocol without intravenous contrast.  Comparison: 12/04/2009  Findings: Stable cholelithiasis.  Chronic changes at the lung bases.  There is mild left hydronephrosis.  Right hydronephrosis has worsened.  There is significant perinephric stranding about the right kidney.  The patient is status post cystectomy with right lower quadrant ileal conduit.  Coronary artery calcifications are noted at the lung bases.  Extensive stool throughout the length of the colon is noted.  Left lower quadrant colostomy is in place.  Peristomal hernia containing several loops of colon is noted.  Small hernia through the pelvic floor on the right containing a small segment of small bowel is not significantly changed.  No disproportionate dilatation of small bowel to suggest obstruction.  Infrarenal abdominal aortic aneurysm is present.  Maximal AP diameter is 5.0 cm.  Previously, this was 4.4 cm.  Stable L1 compression deformity.  No definite evidence of acute appendicitis.  IMPRESSION: Compared the prior study, right hydronephrosis has worsened and right perinephric stranding has worsened.  No ureteral calculi are seen.  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  Previously noted small bowel obstruction pattern has resolved.  Infrarenal abdominal aortic aneurysm has increased from 4.4 cm to 5.0 cm.  Exam is otherwise unchanged.  Original Report Authenticated By: Donavan Burnet, M.D.     1. Pyelonephritis   2. AAA (abdominal  aortic aneurysm)   3. Hydronephrosis, right   4. COPD (chronic obstructive pulmonary disease)       MDM  Pt with hx of prior pyelonephritis, diverting urostomy and colostomy s/p prostate cancer presenting with right flank pain.  Urine c/w pyelonephritis. Pt also having vomiting in ED.  Treated with IV pain meds, IV antiemetics, started on cipro due to allergy to augmentin/presumably penicillin?  Urine culture sent.  Pt admitted to [facility] for further management        Ethelda Chick, MD 06/22/12 1622

## 2012-06-21 NOTE — ED Notes (Signed)
Pt presenting to ed with c/o right side flank pain. Pt states severe pain and nausea and vomiting. Pt states he has a colostomy so he's unsure about the diarrhea. Pt states history of kidney problems pt states he's had catheter x 11 years.

## 2012-06-21 NOTE — H&P (Signed)
[facility] History and Physical  Michael Novak:[id number] DOB: 05-06-1930    PCP:   Kimber Relic, MD   Chief Complaint: Right flank pain.  HPI: Michael Novak is an 78 y.o. male with hx of prostate CA, s/p cystectomy, ilial conduit, and urostomy for rectal-ureteral fistula, ureteral stenosis, AAA, presents to ER at [facility] with severe progressive right flank pain.  He was found to have worsening right hydronephrosis (suspicious for worsening of the ureteral anastemosis, with perinephric stranding, no ureteral stone, and AAA now 5cm (4.4cm in Jan 11).  He has UA with RBC, minimal WBC, but cloudy.  He has no leukocytosis, and Cr is 1.48 with normal electrolytes.  He was given Cipro as he is allergic to PCN, IVF, and hospitalist was asked to admit him for pyelonephritis with worsening right hydronephrosis.  Rewiew of Systems:  Constitutional: Negative for malaise, fever and chills. No significant weight loss or weight gain Eyes: Negative for eye pain, redness and discharge, diplopia, visual changes, or flashes of light. ENMT: Negative for ear pain, hoarseness, nasal congestion, sinus pressure and sore throat. No headaches; tinnitus, drooling, or problem swallowing. Cardiovascular: Negative for chest pain, palpitations, diaphoresis, dyspnea and peripheral edema. ; No orthopnea, PND Respiratory: Negative for cough, hemoptysis, wheezing and stridor. No pleuritic chestpain. Gastrointestinal: Negative for nausea, vomiting, constipation, abdominal pain, melena, blood in stool, hematemesis, jaundice and rectal bleeding.    Genitourinary: Negative for frequency, dysuria,  and hematuria; Musculoskeletal: Negative for neck pain. Negative for swelling and trauma.;  Skin: . Negative for pruritus, rash, abrasions, bruising and skin lesion.; ulcerations Neuro: Negative for headache, lightheadedness and neck stiffness. Negative for weakness, altered level of consciousness , altered mental status,  extremity weakness, burning feet, involuntary movement, seizure and syncope.  Psych: negative for anxiety, depression, insomnia, tearfulness, panic attacks, hallucinations, paranoia, suicidal or homicidal ideation    Past Medical History  Diagnosis Date  . History of colostomy   . Urostomy stenosis     Ask patient to clarify, per history form dated 04/02/11.  Marland Kitchen Peristomal hernia   . Prostate cancer   . Skin cancer     Past Surgical History  Procedure Date  . Cystectomy, ileal coduit, colostomy 2002    for severe rectourethral fistula  . Ex lap, lysis of adhesions 2010    for SBO  . Lap repair of parastomal hernias 2009    Dr. Barnetta Chapel [facility],  Hematoma evac POD#1    Medications:  HOME MEDS: Prior to Admission medications   Medication Sig Start Date End Date Taking? Authorizing Provider  aspirin 81 MG tablet Take 81 mg by mouth daily. Ask patient to clarify dosage. Not indicated on history form dated 04/02/11.    Yes Historical Provider, MD  buPROPion (WELLBUTRIN SR) 150 MG 12 hr tablet Take 150 mg by mouth 2 (two) times daily.     Yes Historical Provider, MD  calcium & magnesium carbonates (MYLANTA) 311-232 MG per tablet Take 1 tablet by mouth daily.     Yes Historical Provider, MD  ciprofloxacin (CIPRO) 500 MG tablet Take 500 mg by mouth 2 (two) times daily. For 7 days. Take at onset of kidney pain.   Yes Historical Provider, MD  gabapentin (NEURONTIN) 300 MG capsule Take 300 mg by mouth 3 (three) times daily.     Yes Historical Provider, MD  glucosamine-chondroitin 500-400 MG tablet Take 1 tablet by mouth 3 (three) times daily.   Yes Historical Provider, MD  Multiple Vitamin (MULTIVITAMIN)  tablet Take 1 tablet by mouth daily.     Yes Historical Provider, MD  nitrofurantoin (MACRODANTIN) 50 MG capsule Take 50 mg by mouth at bedtime.   Yes Historical Provider, MD  omega-3 acid ethyl esters (LOVAZA) 1 G capsule Take 1 g by mouth 2 (two) times daily.   Yes Historical Provider, MD    simvastatin (ZOCOR) 20 MG tablet Take 20 mg by mouth at bedtime.     Yes Historical Provider, MD     Allergies:  Allergies  Allergen Reactions  . Amoxicillin-Pot Clavulanate     Social History:   reports that he has never smoked. He has never used smokeless tobacco. He reports that he does not drink alcohol or use illicit drugs.  Family History: No family history on file.   Physical Exam: Filed Vitals:   06/21/12 1519 06/21/12 2014  BP: 167/76 118/93  Pulse: 60 70  Temp: 97.5 F (36.4 C) 99.6 F (37.6 C)  TempSrc:  Oral  Resp: 20 18  SpO2: 96% 92%   Blood pressure 118/93, pulse 70, temperature 99.6 F (37.6 C), temperature source Oral, resp. rate 18, SpO2 92.00%.  GEN:  Pleasant patient lying in the stretcher in no acute distress; cooperative with exam. PSYCH:  alert and oriented x4; does not appear anxious or depressed; affect is appropriate. HEENT: Mucous membranes pink and anicteric; PERRLA; EOM intact; no cervical lymphadenopathy nor thyromegaly or carotid bruit; no JVD; There were no stridor. Neck is very supple. Breasts:: Not examined CHEST WALL: No tenderness CHEST: Normal respiration, clear to auscultation bilaterally.  HEART: Regular rate and rhythm.  There are no murmur, rub, or gallops.   BACK: No kyphosis or scoliosis; no CVA tenderness ABDOMEN: soft and non-tender; no masses, no organomegaly, normal abdominal bowel sounds; no pannus; no intertriginous candida. There is no rebound and no distention.  He has a colostomy on the left and a urostomy on the right Rectal Exam: Not done EXTREMITIES: No bone or joint deformity; age-appropriate arthropathy of the hands and knees; no edema; no ulcerations.  There is no calf tenderness. Genitalia: not examined PULSES: 2+ and symmetric SKIN: Normal hydration no rash or ulceration CNS: Cranial nerves 2-12 grossly intact no focal lateralizing neurologic deficit.  Speech is fluent; uvula elevated with phonation, facial  symmetry and tongue midline. DTR are normal bilaterally, cerebella exam is intact, barbinski is negative and strengths are equaled bilaterally.  No sensory loss.   Labs on Admission:  Basic Metabolic Panel:  Lab 06/21/12 4098  NA 137  K 4.6  CL 99  CO2 21  GLUCOSE 130*  BUN 30*  CREATININE 1.48*  CALCIUM 9.9  MG --  PHOS --   Liver Function Tests:  Lab 06/21/12 1630  AST 20  ALT 16  ALKPHOS 51  BILITOT 0.6  PROT 8.0  ALBUMIN 4.4    Lab 06/21/12 1630  LIPASE 23  AMYLASE --   No results found for this basename: AMMONIA:5 in the last 168 hours CBC:  Lab 06/21/12 1630  WBC 9.3  NEUTROABS 7.5  HGB 16.0  HCT 45.7  MCV 89.3  PLT 176   Cardiac Enzymes: No results found for this basename: CKTOTAL:5,CKMB:5,CKMBINDEX:5,TROPONINI:5 in the last 168 hours  CBG: No results found for this basename: GLUCAP:5 in the last 168 hours   Radiological Exams on Admission: Ct Abdomen Pelvis Wo Contrast  06/21/2012  *RADIOLOGY REPORT*  Clinical Data: Right flank pain  CT ABDOMEN AND PELVIS WITHOUT CONTRAST  Technique:  Multidetector CT  imaging of the abdomen and pelvis was performed following the standard protocol without intravenous contrast.  Comparison: 12/04/2009  Findings: Stable cholelithiasis.  Chronic changes at the lung bases.  There is mild left hydronephrosis.  Right hydronephrosis has worsened.  There is significant perinephric stranding about the right kidney.  The patient is status post cystectomy with right lower quadrant ileal conduit.  Coronary artery calcifications are noted at the lung bases.  Extensive stool throughout the length of the colon is noted.  Left lower quadrant colostomy is in place.  Peristomal hernia containing several loops of colon is noted.  Small hernia through the pelvic floor on the right containing a small segment of small bowel is not significantly changed.  No disproportionate dilatation of small bowel to suggest obstruction.  Infrarenal abdominal  aortic aneurysm is present.  Maximal AP diameter is 5.0 cm.  Previously, this was 4.4 cm.  Stable L1 compression deformity.  No definite evidence of acute appendicitis.  IMPRESSION: Compared the prior study, right hydronephrosis has worsened and right perinephric stranding has worsened.  No ureteral calculi are seen.  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  Previously noted small bowel obstruction pattern has resolved.  Infrarenal abdominal aortic aneurysm has increased from 4.4 cm to 5.0 cm.  Exam is otherwise unchanged.  Original Report Authenticated By: Donavan Burnet, M.D.     Assessment/Plan Present on Admission:  .Pyelonephritis .Prostate cancer .Hydronephrosis, right .COPD (chronic obstructive pulmonary disease) .AAA (abdominal aortic aneurysm)   PLAN:  Will admit him for IV antibiotic and IVF.  Please consult urology in the am as he has worsening of his right hydronephrosis suspicious for worsening stenosis at the known anastemosis.  His AAA has grown, albeit slowly, and its prudent to just let his vascular surgeon be aware of it.  His COPD, and other problems are stable, and I will continue his meds.  He is stable, full code, and will be admitted to [facility] service.  Other plans as per orders.  Code Status: Golda Acre, MD. [facility] Pager [phone] 7pm to 7am.  06/21/2012, 9:06 PM

## 2012-06-21 NOTE — ED Notes (Signed)
IV team paged again, states she will be here ASAP.

## 2012-06-21 NOTE — ED Notes (Signed)
Patient transported to CT 

## 2012-06-21 NOTE — ED Notes (Signed)
PPT REPORTS PAIN TO r FLANK STARTED AT 11AM TODAY.

## 2012-06-21 NOTE — ED Notes (Signed)
Zofran given to L buttock due to no IV access.

## 2012-06-22 ENCOUNTER — Inpatient Hospital Stay (HOSPITAL_COMMUNITY): Payer: Medicare Other

## 2012-06-22 ENCOUNTER — Encounter (HOSPITAL_COMMUNITY): Payer: Self-pay | Admitting: *Deleted

## 2012-06-22 DIAGNOSIS — J449 Chronic obstructive pulmonary disease, unspecified: Secondary | ICD-10-CM

## 2012-06-22 LAB — BASIC METABOLIC PANEL
CO2: 25 mEq/L (ref 19–32)
GFR calc non Af Amer: 37 mL/min — ABNORMAL LOW (ref >90)
Glucose, Bld: 116 mg/dL — ABNORMAL HIGH (ref 70–99)
Potassium: 4.3 mEq/L (ref 3.5–5.1)
Sodium: 136 mEq/L (ref 135–145)

## 2012-06-22 LAB — CBC
Hemoglobin: 14.1 g/dL (ref 13.0–17.0)
MCH: 30.7 pg (ref 26.0–34.0)
MCV: 89.6 fL (ref 78.0–100.0)
RBC: 4.6 MIL/uL (ref 4.22–5.81)

## 2012-06-22 LAB — URINE CULTURE
Colony Count: NO GROWTH
Culture: NO GROWTH

## 2012-06-22 MED ORDER — GUAIFENESIN ER 600 MG PO TB12
600.0000 mg | ORAL_TABLET | Freq: Two times a day (BID) | ORAL | Status: DC
  Administered 2012-06-22 – 2012-07-02 (×15): 600 mg via ORAL
  Filled 2012-06-22 (×22): qty 1

## 2012-06-22 MED ORDER — FENTANYL CITRATE 0.05 MG/ML IJ SOLN
INTRAMUSCULAR | Status: AC
  Filled 2012-06-22: qty 6

## 2012-06-22 MED ORDER — ALBUTEROL SULFATE (5 MG/ML) 0.5% IN NEBU
2.5000 mg | INHALATION_SOLUTION | RESPIRATORY_TRACT | Status: AC
  Administered 2012-06-22: 2.5 mg via RESPIRATORY_TRACT
  Filled 2012-06-22: qty 0.5

## 2012-06-22 MED ORDER — IOHEXOL 300 MG/ML  SOLN
10.0000 mL | Freq: Once | INTRAMUSCULAR | Status: AC | PRN
Start: 2012-06-22 — End: 2012-06-22
  Administered 2012-06-22: 10 mL

## 2012-06-22 MED ORDER — MIDAZOLAM HCL 5 MG/5ML IJ SOLN
INTRAMUSCULAR | Status: AC | PRN
  Administered 2012-06-22 (×2): .25 mg via INTRAVENOUS

## 2012-06-22 MED ORDER — ACETAMINOPHEN 325 MG PO TABS
650.0000 mg | ORAL_TABLET | Freq: Four times a day (QID) | ORAL | Status: DC | PRN
Start: 2012-06-22 — End: 2012-07-02
  Administered 2012-06-22 – 2012-06-30 (×4): 650 mg via ORAL
  Filled 2012-06-22 (×4): qty 2

## 2012-06-22 MED ORDER — MIDAZOLAM HCL 2 MG/2ML IJ SOLN
INTRAMUSCULAR | Status: AC
  Filled 2012-06-22: qty 2

## 2012-06-22 MED ORDER — LIDOCAINE HCL 1 % IJ SOLN
INTRAMUSCULAR | Status: AC
  Filled 2012-06-22: qty 20

## 2012-06-22 MED ORDER — FENTANYL CITRATE 0.05 MG/ML IJ SOLN
INTRAMUSCULAR | Status: AC | PRN
  Administered 2012-06-22 (×3): 50 ug via INTRAVENOUS

## 2012-06-22 MED ORDER — BIOTENE DRY MOUTH MT LIQD
15.0000 mL | Freq: Two times a day (BID) | OROMUCOSAL | Status: DC
  Administered 2012-06-22 – 2012-07-02 (×18): 15 mL via OROMUCOSAL

## 2012-06-22 NOTE — Progress Notes (Signed)
Patient arrived from ED to room 1311. Oriented to room and unit. Watched safety video. Patient admitted with right pyelonephritis. ABT started in ED. VSS.

## 2012-06-22 NOTE — Procedures (Signed)
Placement of right percutaneous nephrostomy.  Removed 200 ml of clay colored urine, concerning for pyonephrosis.  No immediate complication.

## 2012-06-22 NOTE — Progress Notes (Signed)
UR done. 

## 2012-06-22 NOTE — Interval H&P Note (Cosign Needed)
History and Physical Interval Note:  06/22/2012 9:50 AM  Michael Novak  Has been admitted and found to have significant worsening of chronic hydronephrosis as well as urteronephrosis. He has had prior ileal conduit from previous cystoprostatectomy. He also has colostomy. With a large peristomal hernia. Dr. Vernie Ammons has seen pt and requests perc nephrostomy drain be placed with urine culture. The various methods of treatment have been discussed with the patient and family. After consideration of risks, benefits and other options for treatment, the patient has consented to right nephrostomy drain placement. intervention .  The patient's history has been reviewed, patient examined, no change in status, stable for surgery.  I have reviewed the patient's chart and labs.  Questions were answered to the patient's satisfaction.   He has significant COPD and feels congested now. We will try to work on some breathing treatments and get him his usual inhalers prior to procedure to maximize resp status with sedation planned. BP 114/67  Pulse 69  Temp 99.7 F (37.6 C) (Oral)  Resp 17  Ht 6\' 2"  (1.88 m)  Wt 218 lb 0.6 oz (98.9 kg)  BMI 27.99 kg/m2  SpO2 92% Lungs: coarse BS, better after few coughs, still some scattered wheezes. Heart: reg Abd: soft, NT. RLQ ileal conduit noted, some UOP LLQ colostomy pink and viable.  Results for orders placed during the hospital encounter of 06/21/12  URINALYSIS, ROUTINE W REFLEX MICROSCOPIC      Component Value Range   Color, Urine YELLOW  YELLOW   APPearance CLOUDY (*) CLEAR   Specific Gravity, Urine 1.030  1.005 - 1.030   pH 5.0  5.0 - 8.0   Glucose, UA NEGATIVE  NEGATIVE mg/dL   Hgb urine dipstick LARGE (*) NEGATIVE   Bilirubin Urine NEGATIVE  NEGATIVE   Ketones, ur NEGATIVE  NEGATIVE mg/dL   Protein, ur NEGATIVE  NEGATIVE mg/dL   Urobilinogen, UA 0.2  0.0 - 1.0 mg/dL   Nitrite NEGATIVE  NEGATIVE   Leukocytes, UA NEGATIVE  NEGATIVE  COMPREHENSIVE METABOLIC  PANEL      Component Value Range   Sodium 137  135 - 145 mEq/L   Potassium 4.6  3.5 - 5.1 mEq/L   Chloride 99  96 - 112 mEq/L   CO2 21  19 - 32 mEq/L   Glucose, Bld 130 (*) 70 - 99 mg/dL   BUN 30 (*) 6 - 23 mg/dL   Creatinine, Ser 3.24 (*) 0.50 - 1.35 mg/dL   Calcium 9.9  8.4 - 40.1 mg/dL   Total Protein 8.0  6.0 - 8.3 g/dL   Albumin 4.4  3.5 - 5.2 g/dL   AST 20  0 - 37 U/L   ALT 16  0 - 53 U/L   Alkaline Phosphatase 51  39 - 117 U/L   Total Bilirubin 0.6  0.3 - 1.2 mg/dL   GFR calc non Af Amer 43 (*) >90 mL/min   GFR calc Af Amer 50 (*) >90 mL/min  CBC WITH DIFFERENTIAL      Component Value Range   WBC 9.3  4.0 - 10.5 K/uL   RBC 5.12  4.22 - 5.81 MIL/uL   Hemoglobin 16.0  13.0 - 17.0 g/dL   HCT 02.7  25.3 - 66.4 %   MCV 89.3  78.0 - 100.0 fL   MCH 31.3  26.0 - 34.0 pg   MCHC 35.0  30.0 - 36.0 g/dL   RDW 40.3  47.4 - 25.9 %   Platelets 176  150 - 400 K/uL   Neutrophils Relative 81 (*) 43 - 77 %   Neutro Abs 7.5  1.7 - 7.7 K/uL   Lymphocytes Relative 9 (*) 12 - 46 %   Lymphs Abs 0.8  0.7 - 4.0 K/uL   Monocytes Relative 11  3 - 12 %   Monocytes Absolute 1.0  0.1 - 1.0 K/uL   Eosinophils Relative 0  0 - 5 %   Eosinophils Absolute 0.0  0.0 - 0.7 K/uL   Basophils Relative 0  0 - 1 %   Basophils Absolute 0.0  0.0 - 0.1 K/uL  LIPASE, BLOOD      Component Value Range   Lipase 23  11 - 59 U/L  URINE MICROSCOPIC-ADD ON      Component Value Range   WBC, UA 0-2  <3 WBC/hpf   RBC / HPF 3-6  <3 RBC/hpf   Bacteria, UA MANY (*) RARE   Crystals URIC ACID CRYSTALS (*) NEGATIVE   Urine-Other AMORPHOUS URATES/PHOSPHATES    BASIC METABOLIC PANEL      Component Value Range   Sodium 136  135 - 145 mEq/L   Potassium 4.3  3.5 - 5.1 mEq/L   Chloride 101  96 - 112 mEq/L   CO2 25  19 - 32 mEq/L   Glucose, Bld 116 (*) 70 - 99 mg/dL   BUN 29 (*) 6 - 23 mg/dL   Creatinine, Ser 1.61 (*) 0.50 - 1.35 mg/dL   Calcium 8.9  8.4 - 09.6 mg/dL   GFR calc non Af Amer 37 (*) >90 mL/min   GFR calc  Af Amer 43 (*) >90 mL/min  CBC      Component Value Range   WBC 9.2  4.0 - 10.5 K/uL   RBC 4.60  4.22 - 5.81 MIL/uL   Hemoglobin 14.1  13.0 - 17.0 g/dL   HCT 04.5  40.9 - 81.1 %   MCV 89.6  78.0 - 100.0 fL   MCH 30.7  26.0 - 34.0 pg   MCHC 34.2  30.0 - 36.0 g/dL   RDW 91.4  78.2 - 95.6 %   Platelets 161  150 - 400 K/uL   Seen for Dr. Cyndia Diver, [facility] PA-C

## 2012-06-22 NOTE — Progress Notes (Signed)
[facility] PROGRESS NOTE  Michael Novak VQQ:[id number] DOB: 1930/05/03 DOA: 06/21/2012 PCP: Kimber Relic, MD  Assessment/Plan: Principal Problem:  *Pyelonephritis Active Problems:  Prostate cancer  COPD (chronic obstructive pulmonary disease)  Hydronephrosis, right  AAA (abdominal aortic aneurysm)  1. Worsening hydronephrosis- called urology- plan a placement of a R nephrostomy tube with a possible double J stent placement later 2. Pyelonephritis- culture urine 3. COPD- stable 4. AAA- follow up with vascular as an outpatient 5. Prostate Ca  Code Status: full Family Communication: none Disposition Plan: home when better    Consultants:  urology  HPI/Subjective: C/o cough with congestion No fever No chills   Objective: Filed Vitals:   06/21/12 2145 06/21/12 2200 06/21/12 2220 06/22/12 0450  BP: 133/78   114/67  Pulse: 70  64 69  Temp: 99.4 F (37.4 C)   99.7 F (37.6 C)  TempSrc: Oral   Oral  Resp: 19   17  Height: 6\' 2"  (1.88 m)     Weight: 98.9 kg (218 lb 0.6 oz)     SpO2: 92% 92%  92%    Intake/Output Summary (Last 24 hours) at 06/22/12 1108 Last data filed at 06/22/12 0600  Gross per 24 hour  Intake   1960 ml  Output    550 ml  Net   1410 ml    Exam:  GEN: Pleasant patient lying in the stretcher in no acute distress; cooperative with exam.  PSYCH: alert and oriented x4; does not appear anxious or depressed; affect is appropriate.  CHEST: Normal respiration, clear to auscultation bilaterally.  HEART: Regular rate and rhythm. There are no murmur, rub, or gallops.  ABDOMEN: soft and non-tender; no masses, no organomegaly, normal abdominal bowel sounds; no pannus; no intertriginous candida. There is no rebound and no distention. He has a colostomy on the left and a urostomy on the right  EXTREMITIES: No bone or joint deformity; age-appropriate arthropathy of the hands and knees; no edema; no ulcerations. There is no calf tenderness.  SKIN:  Normal hydration no rash or ulceration  CNS: Cranial nerves 2-12 grossly intact no focal lateralizing neurologic deficit. Speech is fluent; uvula elevated with phonation, facial symmetry and tongue midline. DTR are normal bilaterally, cerebella exam is intact, barbinski is negative and strengths are equaled bilaterally. No sensory loss.   Data Reviewed: Basic Metabolic Panel:  Lab 06/22/12 4332 06/21/12 1630  NA 136 137  K 4.3 4.6  CL 101 99  CO2 25 21  GLUCOSE 116* 130*  BUN 29* 30*  CREATININE 1.67* 1.48*  CALCIUM 8.9 9.9  MG -- --  PHOS -- --   Liver Function Tests:  Lab 06/21/12 1630  AST 20  ALT 16  ALKPHOS 51  BILITOT 0.6  PROT 8.0  ALBUMIN 4.4    Lab 06/21/12 1630  LIPASE 23  AMYLASE --   No results found for this basename: AMMONIA:5 in the last 168 hours CBC:  Lab 06/22/12 0349 06/21/12 1630  WBC 9.2 9.3  NEUTROABS -- 7.5  HGB 14.1 16.0  HCT 41.2 45.7  MCV 89.6 89.3  PLT 161 176   Cardiac Enzymes: No results found for this basename: CKTOTAL:5,CKMB:5,CKMBINDEX:5,TROPONINI:5 in the last 168 hours BNP (last 3 results) No results found for this basename: PROBNP:3 in the last 8760 hours CBG: No results found for this basename: GLUCAP:5 in the last 168 hours  No results found for this or any previous visit (from the past 240 hour(s)).   Studies: Ct Abdomen  Pelvis Wo Contrast  06/21/2012  *RADIOLOGY REPORT*  Clinical Data: Right flank pain  CT ABDOMEN AND PELVIS WITHOUT CONTRAST  Technique:  Multidetector CT imaging of the abdomen and pelvis was performed following the standard protocol without intravenous contrast.  Comparison: 12/04/2009  Findings: Stable cholelithiasis.  Chronic changes at the lung bases.  There is mild left hydronephrosis.  Right hydronephrosis has worsened.  There is significant perinephric stranding about the right kidney.  The patient is status post cystectomy with right lower quadrant ileal conduit.  Coronary artery calcifications are  noted at the lung bases.  Extensive stool throughout the length of the colon is noted.  Left lower quadrant colostomy is in place.  Peristomal hernia containing several loops of colon is noted.  Small hernia through the pelvic floor on the right containing a small segment of small bowel is not significantly changed.  No disproportionate dilatation of small bowel to suggest obstruction.  Infrarenal abdominal aortic aneurysm is present.  Maximal AP diameter is 5.0 cm.  Previously, this was 4.4 cm.  Stable L1 compression deformity.  No definite evidence of acute appendicitis.  IMPRESSION: Compared the prior study, right hydronephrosis has worsened and right perinephric stranding has worsened.  No ureteral calculi are seen.  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  Previously noted small bowel obstruction pattern has resolved.  Infrarenal abdominal aortic aneurysm has increased from 4.4 cm to 5.0 cm.  Exam is otherwise unchanged.  Original Report Authenticated By: Donavan Burnet, M.D.    Scheduled Meds:   . albuterol  2.5 mg Nebulization Q4H  . alum & mag hydroxide-simeth  30 mL Oral Daily  . antiseptic oral rinse  15 mL Mouth Rinse BID  . aspirin EC  81 mg Oral Daily  . buPROPion  150 mg Oral BID  . ciprofloxacin  400 mg Intravenous Once  . ciprofloxacin  400 mg Intravenous Q12H  . gabapentin  300 mg Oral TID  . guaiFENesin  600 mg Oral BID  . heparin  5,000 Units Subcutaneous Q8H  .  morphine injection  4 mg Intravenous Once  .  morphine injection  4 mg Intravenous Once  . multivitamin with minerals  1 tablet Oral Daily  . omega-3 acid ethyl esters  1 g Oral BID  . ondansetron (ZOFRAN) IV  4 mg Intravenous Once  . ondansetron  4 mg Intravenous Once  . simvastatin  20 mg Oral QHS  . sodium chloride  1,000 mL Intravenous Once  . DISCONTD: aspirin  81 mg Oral Daily  . DISCONTD: calcium & magnesium carbonates  1 tablet Oral Daily    . DISCONTD: glucosamine-chondroitin  1 tablet Oral TID  . DISCONTD: multivitamin  1 tablet Oral Daily   Continuous Infusions:   . sodium chloride 1,000 mL (06/22/12 0011)    Principal Problem:  *Pyelonephritis Active Problems:  Prostate cancer  COPD (chronic obstructive pulmonary disease)  Hydronephrosis, right  AAA (abdominal aortic aneurysm)    Time spent: 30    [facility], Juergen Hardenbrook  [facility] Pager [phone] . 06/22/2012, 11:08 AM  LOS: 1 day

## 2012-06-22 NOTE — Plan of Care (Signed)
Problem: Phase I Progression Outcomes Goal: Voiding-avoid urinary catheter unless indicated Outcome: Not Applicable Date Met:  06/22/12 Pt has urostomy

## 2012-06-22 NOTE — Progress Notes (Signed)
Patient ID: Michael Novak, male   DOB: 1930-02-22, 78 y.o.   MRN: [id number] Urology Consult  Reason for referral:Worsening right hydronephrosis.  History of Present Illness: The patient is an 78 year old male patient of Dr. Logan Bores who has a complex past urologic history. He underwent radioactive seed implantation for prostate cancer and developed a prostatic-rectal fistula. He was evaluated for this at [facility] where he was told there was nothing that could be done.He eventually ended up undergoing a cystoprostatectomy with ileal conduit as well as a proctocolectomy with colostomy. He developed peristomal hernias which were treated and he has continued to see Dr. Logan Bores on a regular basis. It's been several months since his last appointment.  He was admitted to the hospital after developing decreased appetite as well as abdominal and right flank pain. His abdominal pain is mild to moderate but he reports that he has not had any colostomy output for 2 days. He continues to have good output from his ileal conduit and has not experienced any hematuria. He also denies any fever or chills. A CT scan was obtained which revealed mild right hydronephrosis on the left side as well as moderately severe hydronephrosis on the right that has progressed over time. In addition he was noted to have an elevation of his creatinine which has varied over the years. In 1/11 I note his creatinine was 0.97. In 1/13 it was 1.11 but has fluctuated as high as 2.0 and currently is 1.67. He does not have an elevation of his white count.  Past Medical History  Diagnosis Date  . History of colostomy   . Urostomy stenosis     Ask patient to clarify, per history form dated 04/02/11.  Marland Kitchen Peristomal hernia   . Prostate cancer   . Skin cancer   . Neuropathy     in feet   . COPD (chronic obstructive pulmonary disease)   . Depression   . UTI (lower urinary tract infection)     history    Past Surgical History  Procedure Date  .  Cystectomy, ileal coduit, colostomy 2002    for severe rectourethral fistula  . Ex lap, lysis of adhesions 2010    for SBO  . Lap repair of parastomal hernias 2009    Dr. Barnetta [facility],  Hematoma evac POD#1    Medications:  Anti-infectives     Start     Dose/Rate Route Frequency Ordered Stop   06/22/12 0800   ciprofloxacin (CIPRO) IVPB 400 mg        400 mg 200 mL/hr over 60 Minutes Intravenous Every 12 hours 06/21/12 2159     06/21/12 1945   ciprofloxacin (CIPRO) IVPB 400 mg        400 mg 200 mL/hr over 60 Minutes Intravenous  Once 06/21/12 1936 06/21/12 2048          Allergies:  Allergies  Allergen Reactions  . Amoxicillin-Pot Clavulanate     History reviewed. No pertinent family history.  Social History:  reports that he has quit smoking. His smoking use included Cigarettes. He quit after 37 years of use. He has never used smokeless tobacco. He reports that he drinks alcohol. He reports that he does not use illicit drugs.  Review of Systems: Pertinent items are noted in HPI.  Physical Exam:  Vital signs in last 24 hours: Temp:  [97.5 F (36.4 C)-99.7 F (37.6 C)] 99.7 F (37.6 C) (07/25 0450) Pulse Rate:  [60-70] 69  (07/25 0450) Resp:  [17-20]  17  (07/25 0450) BP: (114-167)/(67-93) 114/67 mmHg (07/25 0450) SpO2:  [92 %-96 %] 92 % (07/25 0450) FiO2 (%):  [3 %-32 %] 32 % (07/24 2200) Weight:  [98.9 kg (218 lb 0.6 oz)] 98.9 kg (218 lb 0.6 oz) (07/24 2145) General appearance: alert and appears stated age Head: Normocephalic, without obvious abnormality, atraumatic Eyes: conjunctivae/corneas clear. EOM's intact.  Oropharynx: moist mucous membranes Neck: supple, symmetrical, trachea midline Resp: normal respiratory effort Cardio: regular rate and rhythm Back: symmetric, no curvature. ROM normal. No CVA tenderness. GI: soft, Mildly tender in the right and left upper quadrants but no peritoneal signs or mass notedl; no masses,  no organomegaly. He has an ileostomy  in the right lower quadrant and a colostomy in the left lower quadrant that appear viable. There does appear to be some laxity of his abdominal wall musculature bilaterally. Male genitalia: penis: normal male phallus with no lesions or discharge.Testes: bilaterally descended with no masses or tenderness. no hernias Rectal: He has no rectum. Extremities: extremities normal, atraumatic, no cyanosis or edema Skin: Skin color normal. No visible rashes or lesions Neurologic: Grossly normal  Laboratory Data:   Basename 06/22/12 0349 06/21/12 1630  WBC 9.2 9.3  HGB 14.1 16.0  HCT 41.2 45.7   BMET  Basename 06/22/12 0349 06/21/12 1630  NA 136 137  K 4.3 4.6  CL 101 99  CO2 25 21  GLUCOSE 116* 130*  BUN 29* 30*  CREATININE 1.67* 1.48*  CALCIUM 8.9 9.9   No results found for this basename: LABPT:3,INR:3 in the last 72 hours No results found for this basename: LABURIN:1 in the last 72 hours Results for orders placed during the hospital encounter of 01/25/12  URINE CULTURE     Status: Normal   Collection Time   01/25/12  5:49 AM      Component Value Range Status Comment   Specimen Description URINE, RANDOM   Final    Special Requests NONE   Final    Culture  Setup Time [id number]   Final    Colony Count 90,000 COLONIES/ML   Final    Culture KLEBSIELLA PNEUMONIAE   Final    Report Status 01/26/2012 FINAL   Final    Organism ID, Bacteria KLEBSIELLA PNEUMONIAE   Final   CULTURE, BLOOD (ROUTINE X 2)     Status: Normal   Collection Time   01/26/12  4:55 PM      Component Value Range Status Comment   Specimen Description BLOOD LEFT HAND   Final    Special Requests BOTTLES DRAWN AEROBIC AND ANAEROBIC 10CC   Final    Culture  Setup Time [id number]   Final    Culture NO GROWTH 5 DAYS   Final    Report Status 02/02/2012 FINAL   Final   CULTURE, BLOOD (ROUTINE X 2)     Status: Normal   Collection Time   01/26/12  5:05 PM      Component Value Range Status Comment   Specimen Description  BLOOD LEFT ARM   Final    Special Requests BOTTLES DRAWN AEROBIC AND ANAEROBIC 10CC   Final    Culture  Setup Time [id number]   Final    Culture NO GROWTH 5 DAYS   Final    Report Status 02/02/2012 FINAL   Final   CLOSTRIDIUM DIFFICILE BY PCR     Status: Normal   Collection Time   01/29/12  4:31 PM      Component Value Range Status  Comment   C difficile by pcr NEGATIVE  NEGATIVE Final    Creatinine:  Basename 06/22/12 0349 06/21/12 1630  CREATININE 1.67* 1.48*    Imaging: Ct Abdomen Pelvis Wo Contrast  06/21/2012  *RADIOLOGY REPORT*  Clinical Data: Right flank pain  CT ABDOMEN AND PELVIS WITHOUT CONTRAST  Technique:  Multidetector CT imaging of the abdomen and pelvis was performed following the standard protocol without intravenous contrast.  Comparison: 12/04/2009  Findings: Stable cholelithiasis.  Chronic changes at the lung bases.  There is mild left hydronephrosis.  Right hydronephrosis has worsened.  There is significant perinephric stranding about the right kidney.  The patient is status post cystectomy with right lower quadrant ileal conduit.  Coronary artery calcifications are noted at the lung bases.  Extensive stool throughout the length of the colon is noted.  Left lower quadrant colostomy is in place.  Peristomal hernia containing several loops of colon is noted.  Small hernia through the pelvic floor on the right containing a small segment of small bowel is not significantly changed.  No disproportionate dilatation of small bowel to suggest obstruction.  Infrarenal abdominal aortic aneurysm is present.  Maximal AP diameter is 5.0 cm.  Previously, this was 4.4 cm.  Stable L1 compression deformity.  No definite evidence of acute appendicitis.  IMPRESSION: Compared the prior study, right hydronephrosis has worsened and right perinephric stranding has worsened.  No ureteral calculi are seen.  Developing stricture at the distal right ureter, at the anastomosis to the ileal conduit may be  developing.  Previously noted small bowel obstruction pattern has resolved.  Infrarenal abdominal aortic aneurysm has increased from 4.4 cm to 5.0 cm.  Exam is otherwise unchanged.  Original Report Authenticated By: Donavan Burnet, M.D.    Impression/Assessment:  He has had progressive worsening of his right hydronephrosis. There is also hydroureter and I suspect he has stenosis at his ureteral-ileal conduit anastomosis.There is fairly extensive perinephric stranding on the right-hand side that would typically be seen with pyelonephritis although his white count is 9.2 and he is not febrile. He has developed pain and an elevation of his creatinine which could be in part prerenal as it appears this has occurred on several occasions in the past. It is my opinion however that placement of a right cutaneous nephrostomy tube is indicated and I therefore have discussed that with the patient today. The plan will be to place a nephrostomy tube in it there is no sign of infection possibly also place a double-J stent across his anastomosis. I have discussed this with interventional radiology and if there is evidence of purulent urine at the time of his percutaneous access this will be cultured and he will be treated with antibiotics with placement of a double-J stent at a later date. I went over this completely with the patient today and answered all of his questions. He understands and has elected to proceed. In the long term he will be scheduled for followup with Dr. Logan Bores who the patient states he has been here in [place] at his clinic on Wednesdays.  Plan:  1. Placement of right nephrostomy tube. 2. Possible double-J stent placement at the time of nephrostomy tube insertion versus at a later date. 3. His urine will be cultured from his right kidney and any infection treated according to sensitivities. 4. He will then followup as an outpatient with Dr. Logan Bores.  Damonique Brunelle C 06/22/2012, 7:57 AM

## 2012-06-23 ENCOUNTER — Inpatient Hospital Stay (HOSPITAL_COMMUNITY): Payer: Medicare Other

## 2012-06-23 MED ORDER — PROMETHAZINE HCL 25 MG/ML IJ SOLN
12.5000 mg | Freq: Three times a day (TID) | INTRAMUSCULAR | Status: DC | PRN
  Administered 2012-06-23: 12.5 mg via INTRAVENOUS
  Filled 2012-06-23: qty 0.5
  Filled 2012-06-23: qty 1

## 2012-06-23 MED ORDER — METOCLOPRAMIDE HCL 5 MG/ML IJ SOLN
5.0000 mg | Freq: Three times a day (TID) | INTRAMUSCULAR | Status: DC
  Administered 2012-06-23 – 2012-06-28 (×14): 5 mg via INTRAVENOUS
  Filled 2012-06-23 (×2): qty 1
  Filled 2012-06-23: qty 2
  Filled 2012-06-23 (×6): qty 1
  Filled 2012-06-23: qty 2
  Filled 2012-06-23 (×4): qty 1
  Filled 2012-06-23: qty 2
  Filled 2012-06-23 (×3): qty 1

## 2012-06-23 NOTE — Progress Notes (Signed)
[facility] PROGRESS NOTE  Michael Novak ZOX:[id number] DOB: 03/17/1930 DOA: 06/21/2012 PCP: Kimber Relic, MD  Assessment/Plan: Principal Problem:  *Pyelonephritis Active Problems:  Prostate cancer  COPD (chronic obstructive pulmonary disease)  Hydronephrosis, right  AAA (abdominal aortic aneurysm)  1. Worsening hydronephrosis- s/p placement of a R nephrostomy tube with a possible double J stent placement later- culture of urine sent- abx: cipro (may need to change based on culture- has grown kleb pna and enterococcus in the past)  2. Pyelonephritis- culture urine 3. COPD- stable 4. AAA- follow up with vascular as an outpatient 5. Prostate Ca  Code Status: full Family Communication: none Disposition Plan: home when better    Consultants:  urology  HPI/Subjective: Not feeling good- unable to clarify if he has pain or why he does not feel well Says he has not been eating  Objective: Filed Vitals:   06/22/12 2040 06/23/12 0145 06/23/12 0500 06/23/12 0624  BP: 102/61 115/70 113/65   Pulse: 77 65 69   Temp: 99.7 F (37.6 C) 98.3 F (36.8 C) 98.4 F (36.9 C)   TempSrc: Oral Oral Oral   Resp: 19 18 19    Height:      Weight:      SpO2: 88% 89% 88% 90%    Intake/Output Summary (Last 24 hours) at 06/23/12 0848 Last data filed at 06/23/12 0600  Gross per 24 hour  Intake   2001 ml  Output   1350 ml  Net    651 ml    Exam:  GEN: Pleasant patient lying in the stretcher in no acute distress; cooperative with exam.  PSYCH: alert and oriented x4; does not appear anxious or depressed; affect is appropriate.  CHEST: Normal respiration, clear to auscultation bilaterally.  HEART: Regular rate and rhythm. There are no murmur, rub, or gallops.  ABDOMEN: soft and non-tender; no masses, no organomegaly, normal abdominal bowel sounds; no pannus; no intertriginous candida. There is no rebound and no distention. He has a colostomy on the left and a urostomy on the right    EXTREMITIES: No bone or joint deformity; age-appropriate arthropathy of the hands and knees; no edema; no ulcerations. There is no calf tenderness.  SKIN: Normal hydration no rash or ulceration  CNS: Cranial nerves 2-12 grossly intact no focal lateralizing neurologic deficit. Speech is fluent; uvula elevated with phonation, facial symmetry and tongue midline. DTR are normal bilaterally, cerebella exam is intact, barbinski is negative and strengths are equaled bilaterally. No sensory loss.   Data Reviewed: Basic Metabolic Panel:  Lab 06/22/12 8119 06/21/12 1630  NA 136 137  K 4.3 4.6  CL 101 99  CO2 25 21  GLUCOSE 116* 130*  BUN 29* 30*  CREATININE 1.67* 1.48*  CALCIUM 8.9 9.9  MG -- --  PHOS -- --   Liver Function Tests:  Lab 06/21/12 1630  AST 20  ALT 16  ALKPHOS 51  BILITOT 0.6  PROT 8.0  ALBUMIN 4.4    Lab 06/21/12 1630  LIPASE 23  AMYLASE --   No results found for this basename: AMMONIA:5 in the last 168 hours CBC:  Lab 06/22/12 0349 06/21/12 1630  WBC 9.2 9.3  NEUTROABS -- 7.5  HGB 14.1 16.0  HCT 41.2 45.7  MCV 89.6 89.3  PLT 161 176   Cardiac Enzymes: No results found for this basename: CKTOTAL:5,CKMB:5,CKMBINDEX:5,TROPONINI:5 in the last 168 hours BNP (last 3 results) No results found for this basename: PROBNP:3 in the last 8760 hours CBG: No results  found for this basename: GLUCAP:5 in the last 168 hours  Recent Results (from the past 240 hour(s))  URINE CULTURE     Status: Normal   Collection Time   06/21/12  6:57 PM      Component Value Range Status Comment   Specimen Description URINE, CLEAN CATCH   Final    Special Requests NONE   Final    Culture  Setup Time 06/22/2012 03:21   Final    Colony Count NO GROWTH   Final    Culture NO GROWTH   Final    Report Status 06/22/2012 FINAL   Final   BODY FLUID CULTURE     Status: Normal (Preliminary result)   Collection Time   06/22/12  3:35 PM      Component Value Range Status Comment   Specimen  Description KIDNEY URINE   Final    Special Requests NONE   Final    Gram Stain     Final    Value: MODERATE WBC PRESENT,BOTH PMN AND MONONUCLEAR     NO ORGANISMS SEEN   Culture PENDING   Incomplete    Report Status PENDING   Incomplete      Studies: Ct Abdomen Pelvis Wo Contrast  06/21/2012  *RADIOLOGY REPORT*  Clinical Data: Right flank pain  CT ABDOMEN AND PELVIS WITHOUT CONTRAST  Technique:  Multidetector CT imaging of the abdomen and pelvis was performed following the standard protocol without intravenous contrast.  Comparison: 12/04/2009  Findings: Stable cholelithiasis.  Chronic changes at the lung bases.  There is mild left hydronephrosis.  Right hydronephrosis has worsened.  There is significant perinephric stranding about the right kidney.  The patient is status post cystectomy with right lower quadrant ileal conduit.  Coronary artery calcifications are noted at the lung bases.  Extensive stool throughout the length of the colon is noted.  Left lower quadrant colostomy is in place.  Peristomal hernia containing several loops of colon is noted.  Small hernia through the pelvic floor on the right containing a small segment of small bowel is not significantly changed.  No disproportionate dilatation of small bowel to suggest obstruction.  Infrarenal abdominal aortic aneurysm is present.  Maximal AP diameter is 5.0 cm.  Previously, this was 4.4 cm.  Stable L1 compression deformity.  No definite evidence of acute appendicitis.  IMPRESSION: Compared the prior study, right hydronephrosis has worsened and right perinephric stranding has worsened.  No ureteral calculi are seen.  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  Previously noted small bowel obstruction pattern has resolved.  Infrarenal abdominal aortic aneurysm has increased from 4.4 cm to 5.0 cm.  Exam is otherwise unchanged.  Original Report Authenticated By: Donavan Burnet,  M.D.    Scheduled Meds:    . albuterol  2.5 mg Nebulization Q4H  . alum & mag hydroxide-simeth  30 mL Oral Daily  . antiseptic oral rinse  15 mL Mouth Rinse BID  . aspirin EC  81 mg Oral Daily  . buPROPion  150 mg Oral BID  . ciprofloxacin  400 mg Intravenous Q12H  . fentaNYL      . gabapentin  300 mg Oral TID  . guaiFENesin  600 mg Oral BID  . heparin  5,000 Units Subcutaneous Q8H  . lidocaine      . midazolam      . multivitamin with minerals  1 tablet Oral Daily  . omega-3 acid ethyl  esters  1 g Oral BID  . simvastatin  20 mg Oral QHS   Continuous Infusions:    . sodium chloride 1,000 mL (06/22/12 2228)    Principal Problem:  *Pyelonephritis Active Problems:  Prostate cancer  COPD (chronic obstructive pulmonary disease)  Hydronephrosis, right  AAA (abdominal aortic aneurysm)    Time spent: 30    [facility], Ileah Falkenstein  [facility] Pager [phone] . 06/23/2012, 8:48 AM  LOS: 2 days

## 2012-06-23 NOTE — Progress Notes (Signed)
Pt now with increased abdominal distention and c/o nausea after attempting to eat pudding. Medicated with zofran. Abd x-ray report noted, will notify Dr. Benjamine Mola.

## 2012-06-23 NOTE — Progress Notes (Signed)
Pt noted to have 0 output from colostomy in two days. Pt with + BS/mild abdominal tenderness throughout, abdomen distended especially around colostomy site. Pt also c/o "sinus congestion". Medicated for pain, added humidifier to oxygen and notified Dr Benjamine Mola of above. Will monitor.

## 2012-06-23 NOTE — Progress Notes (Signed)
Subjective: Patient reports no new complaint today.  Objective: Vital signs in last 24 hours: Temp:  [98.3 F (36.8 C)-100.8 F (38.2 C)] 98.4 F (36.9 C) (07/26 0500) Pulse Rate:  [65-77] 69  (07/26 0500) Resp:  [14-23] 19  (07/26 0500) BP: (100-118)/(45-70) 113/65 mmHg (07/26 0500) SpO2:  [88 %-95 %] 90 % (07/26 0624)  Intake/Output from previous day: 07/25 0701 - 07/26 0700 In: 2001 [P.O.:480; I.V.:1321; IV Piggyback:200] Out: 1350 [Urine:1350] Intake/Output this shift:    Physical Exam:  General:alert GI: soft, non tender, normal bowel sounds, no palpable masses, no organomegaly, no inguinal hernia and soft His nephrostomy tube in the right flank is draining slightly bloody urine  Lab Results:  Basename 06/22/12 0349 06/21/12 1630  HGB 14.1 16.0  HCT 41.2 45.7   BMET  Basename 06/22/12 0349 06/21/12 1630  NA 136 137  K 4.3 4.6  CL 101 99  CO2 25 21  GLUCOSE 116* 130*  BUN 29* 30*  CREATININE 1.67* 1.48*  CALCIUM 8.9 9.9   No results found for this basename: LABPT:3,INR:3 in the last 72 hours No results found for this basename: LABURIN:1 in the last 72 hours Results for orders placed during the hospital encounter of 06/21/12  URINE CULTURE     Status: Normal   Collection Time   06/21/12  6:57 PM      Component Value Range Status Comment   Specimen Description URINE, CLEAN CATCH   Final    Special Requests NONE   Final    Culture  Setup Time 06/22/2012 03:21   Final    Colony Count NO GROWTH   Final    Culture NO GROWTH   Final    Report Status 06/22/2012 FINAL   Final   BODY FLUID CULTURE     Status: Normal (Preliminary result)   Collection Time   06/22/12  3:35 PM      Component Value Range Status Comment   Specimen Description KIDNEY URINE   Final    Special Requests NONE   Final    Gram Stain     Final    Value: MODERATE WBC PRESENT,BOTH PMN AND MONONUCLEAR     NO ORGANISMS SEEN   Culture PENDING   Incomplete    Report Status PENDING    Incomplete     Studies/Results: Ct Abdomen Pelvis Wo Contrast  06/21/2012  *RADIOLOGY REPORT*  Clinical Data: Right flank pain  CT ABDOMEN AND PELVIS WITHOUT CONTRAST  Technique:  Multidetector CT imaging of the abdomen and pelvis was performed following the standard protocol without intravenous contrast.  Comparison: 12/04/2009  Findings: Stable cholelithiasis.  Chronic changes at the lung bases.  There is mild left hydronephrosis.  Right hydronephrosis has worsened.  There is significant perinephric stranding about the right kidney.  The patient is status post cystectomy with right lower quadrant ileal conduit.  Coronary artery calcifications are noted at the lung bases.  Extensive stool throughout the length of the colon is noted.  Left lower quadrant colostomy is in place.  Peristomal hernia containing several loops of colon is noted.  Small hernia through the pelvic floor on the right containing a small segment of small bowel is not significantly changed.  No disproportionate dilatation of small bowel to suggest obstruction.  Infrarenal abdominal aortic aneurysm is present.  Maximal AP diameter is 5.0 cm.  Previously, this was 4.4 cm.  Stable L1 compression deformity.  No definite evidence of acute appendicitis.  IMPRESSION: Compared the prior study,  right hydronephrosis has worsened and right perinephric stranding has worsened.  No ureteral calculi are seen.  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  Previously noted small bowel obstruction pattern has resolved.  Infrarenal abdominal aortic aneurysm has increased from 4.4 cm to 5.0 cm.  Exam is otherwise unchanged.  Original Report Authenticated By: Donavan Burnet, M.D.   Ir Perc Nephrostomy Right  06/22/2012  *RADIOLOGY REPORT*  Clinical history:78 year-old with severe right hydronephrosis.  The patient has had a cystectomy and a urinary diversion.  PROCEDURE(S): PERCUTANEOUS RIGHT  NEPHROSTOMY TUBE WITH ULTRASOUND AND FLUOROSCOPIC GUIDANCE  Physician: Rachelle Hora. Henn, MD  Medications:Versed 0.5 mg, Fentanyl 150 mcg. A radiology nurse monitored the patient for moderate sedation.  Moderate sedation time:35 minutes  Fluoroscopy time: 2.7 minutes  Procedure:The procedure was explained to the patient.  The risks and benefits of the procedure were discussed and the patient's questions were addressed.  Informed consent was obtained from the patient.  The patient was placed in the left lateral decubitus position.   Severe right hydronephrosis was identified.  The right flank was prepped and draped in a sterile fashion.  Maximal barrier sterile technique was utilized including caps, mask, sterile gowns, sterile gloves, sterile drape, hand hygiene and skin antiseptic. The skin was anesthetized with 1% lidocaine.  Using ultrasound guidance, 21 gauge needle was directed into a dilated calix. Cloudy fluid was identified.  A 0.018 wire was advanced and a dilator set was placed.  Cloudy clay colored urine was aspirated. The tract was dilated and a 10-French multipurpose drain was placed over a J wire.  200 ml of clay colored urine was removed.  Catheter was reconstituted in the region of the renal pelvis.  Catheter sutured to the skin and attached to a gravity bag.Fluoroscopic and ultrasound images were taken and saved for documentation.  Findings:Severe right hydronephrosis.  200 ml of clay colored urine was removed.  Findings are concerning for pyonephrosis.  Fluid was sent for culture.  Complications: None  Impression:Successful placement of a right percutaneous nephrostomy tube using ultrasound and fluoroscopic guidance.  Urine sample sent for culture.  Original Report Authenticated By: Richarda Overlie, M.D.   Ir US Guide Bx Asp/drain  06/22/2012  *RADIOLOGY REPORT*  Clinical history:78 year-old with severe right hydronephrosis.  The patient has had a cystectomy and a urinary diversion.  PROCEDURE(S):  PERCUTANEOUS RIGHT NEPHROSTOMY TUBE WITH ULTRASOUND AND FLUOROSCOPIC GUIDANCE  Physician: Rachelle Hora. Henn, MD  Medications:Versed 0.5 mg, Fentanyl 150 mcg. A radiology nurse monitored the patient for moderate sedation.  Moderate sedation time:35 minutes  Fluoroscopy time: 2.7 minutes  Procedure:The procedure was explained to the patient.  The risks and benefits of the procedure were discussed and the patient's questions were addressed.  Informed consent was obtained from the patient.  The patient was placed in the left lateral decubitus position.   Severe right hydronephrosis was identified.  The right flank was prepped and draped in a sterile fashion.  Maximal barrier sterile technique was utilized including caps, mask, sterile gowns, sterile gloves, sterile drape, hand hygiene and skin antiseptic. The skin was anesthetized with 1% lidocaine.  Using ultrasound guidance, 21 gauge needle was directed into a dilated calix. Cloudy fluid was identified.  A 0.018 wire was advanced and a dilator set was placed.  Cloudy clay colored urine was aspirated. The tract was dilated and a 10-French multipurpose drain was placed over a J wire.  200  ml of clay colored urine was removed.  Catheter was reconstituted in the region of the renal pelvis.  Catheter sutured to the skin and attached to a gravity bag.Fluoroscopic and ultrasound images were taken and saved for documentation.  Findings:Severe right hydronephrosis.  200 ml of clay colored urine was removed.  Findings are concerning for pyonephrosis.  Fluid was sent for culture.  Complications: None  Impression:Successful placement of a right percutaneous nephrostomy tube using ultrasound and fluoroscopic guidance.  Urine sample sent for culture.  Original Report Authenticated By: Richarda Overlie, M.D.    Assessment/Plan: His nephrostomy tube was placed without difficulty yesterday. There appeared to be. With urine which was sent for culture and sensitivity. Gram stain revealed  multiple white blood cells. His urine culture was negative. His nephrostomy tube is draining adequately at this time.  Continue nephrostomy drainage for 48 hours while awaiting culture results.  He will then have his nephrostomy tube internalized with a double-J stent.  Will await final culture results from his right kidney and treat accordingly sensitivities.     LOS: 2 days   Zivah Mayr C 06/23/2012, 7:21 AM

## 2012-06-23 NOTE — Progress Notes (Signed)
Subjective: Resting, appears fatigued.   Objective: Vital signs in last 24 hours: Temp:  [98.3 F (36.8 C)-100.8 F (38.2 C)] 98.4 F (36.9 C) (07/26 0500) Pulse Rate:  [65-89] 70  (07/26 0624) Resp:  [14-23] 19  (07/26 0500) BP: (100-128)/(45-82) 113/65 mmHg (07/26 0500) SpO2:  [84 %-95 %] 90 % (07/26 0624) Last BM Date:  (pta )  Intake/Output from previous day: 07/25 0701 - 07/26 0700 In: 2361 [P.O.:840; I.V.:1321; IV Piggyback:200] Out: 1350 [Urine:1350] Intake/Output this shift: Total I/O In: 120 [P.O.:120] Out: 1120 [Urine:1120]  PE:  Right percutaneous nephrostomy intact, site clean and dry. Blood tinged urine in bag.  650 ml output recorded total for 06/22/12. 350 ml recorded output so far today. Cultures pending =- WBC seen so far only.   Lab Results:   Basename 06/22/12 0349 06/21/12 1630  WBC 9.2 9.3  HGB 14.1 16.0  HCT 41.2 45.7  PLT 161 176   BMET  Basename 06/22/12 0349 06/21/12 1630  NA 136 137  K 4.3 4.6  CL 101 99  CO2 25 21  GLUCOSE 116* 130*  BUN 29* 30*  CREATININE 1.67* 1.48*  CALCIUM 8.9 9.9      Studies/Results: Ct Abdomen Pelvis Wo Contrast  06/21/2012  *RADIOLOGY REPORT*  Clinical Data: Right flank pain  CT ABDOMEN AND PELVIS WITHOUT CONTRAST  Technique:  Multidetector CT imaging of the abdomen and pelvis was performed following the standard protocol without intravenous contrast.  Comparison: 12/04/2009  Findings: Stable cholelithiasis.  Chronic changes at the lung bases.  There is mild left hydronephrosis.  Right hydronephrosis has worsened.  There is significant perinephric stranding about the right kidney.  The patient is status post cystectomy with right lower quadrant ileal conduit.  Coronary artery calcifications are noted at the lung bases.  Extensive stool throughout the length of the colon is noted.  Left lower quadrant colostomy is in place.  Peristomal hernia containing several loops of colon is noted.  Small hernia through the  pelvic floor on the right containing a small segment of small bowel is not significantly changed.  No disproportionate dilatation of small bowel to suggest obstruction.  Infrarenal abdominal aortic aneurysm is present.  Maximal AP diameter is 5.0 cm.  Previously, this was 4.4 cm.  Stable L1 compression deformity.  No definite evidence of acute appendicitis.  IMPRESSION: Compared the prior study, right hydronephrosis has worsened and right perinephric stranding has worsened.  No ureteral calculi are seen.  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  Previously noted small bowel obstruction pattern has resolved.  Infrarenal abdominal aortic aneurysm has increased from 4.4 cm to 5.0 cm.  Exam is otherwise unchanged.  Original Report Authenticated By: Donavan Burnet, M.D.   Ir Perc Nephrostomy Right  06/22/2012  *RADIOLOGY REPORT*  Clinical history:78 year-old with severe right hydronephrosis.  The patient has had a cystectomy and a urinary diversion.  PROCEDURE(S): PERCUTANEOUS RIGHT NEPHROSTOMY TUBE WITH ULTRASOUND AND FLUOROSCOPIC GUIDANCE  Physician: Rachelle Hora. Henn, MD  Medications:Versed 0.5 mg, Fentanyl 150 mcg. A radiology nurse monitored the patient for moderate sedation.  Moderate sedation time:35 minutes  Fluoroscopy time: 2.7 minutes  Procedure:The procedure was explained to the patient.  The risks and benefits of the procedure were discussed and the patient's questions were addressed.  Informed consent was obtained from the patient.  The patient was placed in the left lateral decubitus position.   Severe right hydronephrosis was identified.  The right flank was prepped and draped in a sterile fashion.  Maximal barrier sterile technique was utilized including caps, mask, sterile gowns, sterile gloves, sterile drape, hand hygiene and skin antiseptic. The skin was anesthetized with 1% lidocaine.  Using ultrasound guidance, 21 gauge needle was  directed into a dilated calix. Cloudy fluid was identified.  A 0.018 wire was advanced and a dilator set was placed.  Cloudy clay colored urine was aspirated. The tract was dilated and a 10-French multipurpose drain was placed over a J wire.  200 ml of clay colored urine was removed.  Catheter was reconstituted in the region of the renal pelvis.  Catheter sutured to the skin and attached to a gravity bag.Fluoroscopic and ultrasound images were taken and saved for documentation.  Findings:Severe right hydronephrosis.  200 ml of clay colored urine was removed.  Findings are concerning for pyonephrosis.  Fluid was sent for culture.  Complications: None  Impression:Successful placement of a right percutaneous nephrostomy tube using ultrasound and fluoroscopic guidance.  Urine sample sent for culture.  Original Report Authenticated By: Richarda Overlie, M.D.   Ir US Guide Bx Asp/drain  06/22/2012  *RADIOLOGY REPORT*  Clinical history:78 year-old with severe right hydronephrosis.  The patient has had a cystectomy and a urinary diversion.  PROCEDURE(S): PERCUTANEOUS RIGHT NEPHROSTOMY TUBE WITH ULTRASOUND AND FLUOROSCOPIC GUIDANCE  Physician: Rachelle Hora. Henn, MD  Medications:Versed 0.5 mg, Fentanyl 150 mcg. A radiology nurse monitored the patient for moderate sedation.  Moderate sedation time:35 minutes  Fluoroscopy time: 2.7 minutes  Procedure:The procedure was explained to the patient.  The risks and benefits of the procedure were discussed and the patient's questions were addressed.  Informed consent was obtained from the patient.  The patient was placed in the left lateral decubitus position.   Severe right hydronephrosis was identified.  The right flank was prepped and draped in a sterile fashion.  Maximal barrier sterile technique was utilized including caps, mask, sterile gowns, sterile gloves, sterile drape, hand hygiene and skin antiseptic. The skin was anesthetized with 1% lidocaine.  Using ultrasound guidance, 21 gauge  needle was directed into a dilated calix. Cloudy fluid was identified.  A 0.018 wire was advanced and a dilator set was placed.  Cloudy clay colored urine was aspirated. The tract was dilated and a 10-French multipurpose drain was placed over a J wire.  200 ml of clay colored urine was removed.  Catheter was reconstituted in the region of the renal pelvis.  Catheter sutured to the skin and attached to a gravity bag.Fluoroscopic and ultrasound images were taken and saved for documentation.  Findings:Severe right hydronephrosis.  200 ml of clay colored urine was removed.  Findings are concerning for pyonephrosis.  Fluid was sent for culture.  Complications: None  Impression:Successful placement of a right percutaneous nephrostomy tube using ultrasound and fluoroscopic guidance.  Urine sample sent for culture.  Original Report Authenticated By: Richarda Overlie, M.D.    Anti-infectives: Anti-infectives     Start     Dose/Rate Route Frequency Ordered Stop   06/22/12 0800   ciprofloxacin (CIPRO) IVPB 400 mg        400 mg 200 mL/hr over 60 Minutes Intravenous Every 12 hours 06/21/12 2159     06/21/12 1945   ciprofloxacin (CIPRO) IVPB 400 mg        400 mg 200 mL/hr over 60 Minutes Intravenous  Once 06/21/12 1936 06/21/12 2048          Assessment/Plan:  LOS: 2 days  Right hydronephrosis s/p Rt PCN placement by IR 7/25.  Cultures pending, urine slightly bloody compared with that initially sent to lab during placement. Follow cultures - treat appropriately pending final results.  Appears for internalization in future. IR to continue to follow along.     CAMPBELL,PAMELA D 06/23/2012

## 2012-06-24 ENCOUNTER — Inpatient Hospital Stay (HOSPITAL_COMMUNITY): Payer: Medicare Other

## 2012-06-24 DIAGNOSIS — R112 Nausea with vomiting, unspecified: Secondary | ICD-10-CM

## 2012-06-24 DIAGNOSIS — K560 Paralytic ileus: Secondary | ICD-10-CM

## 2012-06-24 DIAGNOSIS — R142 Eructation: Secondary | ICD-10-CM

## 2012-06-24 DIAGNOSIS — R143 Flatulence: Secondary | ICD-10-CM

## 2012-06-24 DIAGNOSIS — R141 Gas pain: Secondary | ICD-10-CM

## 2012-06-24 DIAGNOSIS — IMO0002 Reserved for concepts with insufficient information to code with codable children: Secondary | ICD-10-CM

## 2012-06-24 DIAGNOSIS — N179 Acute kidney failure, unspecified: Secondary | ICD-10-CM

## 2012-06-24 LAB — BASIC METABOLIC PANEL
CO2: 28 mEq/L (ref 19–32)
Calcium: 9.1 mg/dL (ref 8.4–10.5)
Chloride: 99 mEq/L (ref 96–112)
Potassium: 4.2 mEq/L (ref 3.5–5.1)
Sodium: 136 mEq/L (ref 135–145)

## 2012-06-24 LAB — CBC
Platelets: 156 10*3/uL (ref 150–400)
RBC: 4.31 MIL/uL (ref 4.22–5.81)
WBC: 7.3 10*3/uL (ref 4.0–10.5)

## 2012-06-24 MED ORDER — ZOLPIDEM TARTRATE 5 MG PO TABS
5.0000 mg | ORAL_TABLET | Freq: Once | ORAL | Status: AC
  Administered 2012-06-24: 5 mg via ORAL
  Filled 2012-06-24: qty 1

## 2012-06-24 MED ORDER — FLEET ENEMA 7-19 GM/118ML RE ENEM: 1.0000 | ENEMA | Freq: Once | RECTAL | Status: DC

## 2012-06-24 NOTE — Progress Notes (Signed)
Patient ID: Michael Novak, male   DOB: January 25, 1930, 78 y.o.   MRN: [id number]   Subjective: Patient reports no new concerns or issues. Nephrostomy tube appears to be draining well. Both nephrostomy tube urostomy catheters are draining clear urine. Renal function has improved.  Objective: Vital signs in last 24 hours: Temp:  [98.6 F (37 C)-98.9 F (37.2 C)] 98.6 F (37 C) (07/27 0624) Pulse Rate:  [73-85] 80  (07/27 0624) Resp:  [18] 18  (07/27 0624) BP: (128-157)/(72-95) 157/84 mmHg (07/27 0624) SpO2:  [90 %-92 %] 92 % (07/27 0624)  Intake/Output from previous day: 07/26 0701 - 07/27 0700 In: 1560 [P.O.:360; I.V.:1000; IV Piggyback:200] Out: 2220 [Urine:1920; Stool:300] Intake/Output this shift:    Physical Exam:  Constitutional: Vital signs reviewed. WD WN in NAD   Eyes: PERRL, No scleral icterus.   Cardiovascular: RRR Pulmonary/Chest: Normal effort Abdominal: Soft. Non-tender, non-distended, bowel sounds are normal, no masses, organomegaly, or guarding present. Ostomy bag in place over ileal loop urinary diversion. Draining clear urine. Extremities: No cyanosis or edema   Lab Results:  Basename 06/24/12 0420 06/22/12 0349 06/21/12 1630  HGB 13.2 14.1 16.0  HCT 39.0 41.2 45.7   BMET  Basename 06/24/12 0420 06/22/12 0349  NA 136 136  K 4.2 4.3  CL 99 101  CO2 28 25  GLUCOSE 130* 116*  BUN 23 29*  CREATININE 1.42* 1.67*  CALCIUM 9.1 8.9   No results found for this basename: LABPT:3,INR:3 in the last 72 hours No results found for this basename: LABURIN:1 in the last 72 hours Results for orders placed during the hospital encounter of 06/21/12  URINE CULTURE     Status: Normal   Collection Time   06/21/12  6:57 PM      Component Value Range Status Comment   Specimen Description URINE, CLEAN CATCH   Final    Special Requests NONE   Final    Culture  Setup Time 06/22/2012 03:21   Final    Colony Count NO GROWTH   Final    Culture NO GROWTH   Final    Report Status  06/22/2012 FINAL   Final   BODY FLUID CULTURE     Status: Normal (Preliminary result)   Collection Time   06/22/12  3:35 PM      Component Value Range Status Comment   Specimen Description KIDNEY URINE   Final    Special Requests NONE   Final    Gram Stain     Final    Value: MODERATE WBC PRESENT,BOTH PMN AND MONONUCLEAR     NO ORGANISMS SEEN   Culture NO GROWTH   Final    Report Status PENDING   Incomplete   CULTURE, BLOOD (ROUTINE X 2)     Status: Normal (Preliminary result)   Collection Time   06/22/12  8:15 PM      Component Value Range Status Comment   Specimen Description BLOOD RIGHT ARM   Final    Special Requests BOTTLES DRAWN AEROBIC AND ANAEROBIC 10CC   Final    Culture  Setup Time 06/23/2012 01:34   Final    Culture     Final    Value:        BLOOD CULTURE RECEIVED NO GROWTH TO DATE CULTURE WILL BE HELD FOR 5 DAYS BEFORE ISSUING A FINAL NEGATIVE REPORT   Report Status PENDING   Incomplete   CULTURE, BLOOD (ROUTINE X 2)     Status: Normal (Preliminary result)  Collection Time   06/22/12  8:20 PM      Component Value Range Status Comment   Specimen Description BLOOD RIGHT ARM   Final    Special Requests BOTTLES DRAWN AEROBIC AND ANAEROBIC 10CC   Final    Culture  Setup Time 06/23/2012 01:35   Final    Culture     Final    Value:        BLOOD CULTURE RECEIVED NO GROWTH TO DATE CULTURE WILL BE HELD FOR 5 DAYS BEFORE ISSUING A FINAL NEGATIVE REPORT   Report Status PENDING   Incomplete     Studies/Results: Dg Abd 1 View  06/24/2012  *RADIOLOGY REPORT*  Clinical Data: Follow up ileus  ABDOMEN - 1 VIEW  Comparison: 06/23/2012  Findings: Persistent gaseous distended small bowel loops mid abdomen consistent with ileus or partial small bowel obstruction. Stable nephrostomy catheter in the right abdomen NG tube in place with tip in proximal duodenum region.  Stool noted in the right colon and sigmoid colon.  IMPRESSION: Persistent gaseous distended small bowel loops mid abdomen  consistent with ileus or partial small bowel obstruction. NG tube in place.  Original Report Authenticated By: Natasha Mead, M.D.   Dg Abd 1 View  06/23/2012  *RADIOLOGY REPORT*  Clinical Data: Evaluate for ileus  ABDOMEN - 1 VIEW  Comparison: Right-sided percutaneous nephrostomy tube placement - 06/22/2012; Abdominal CT - 06/21/2012  Findings:  There is marked dilatation of multiple loops of small bowel.  A small amount of air is seen within the cecum and ascending colon. Moderate colonic stool burden.  Evaluation for pneumoperitoneum is limited secondary to supine patient positioning and exclusion of the lower thorax. Recently placed percutaneous nephrostomy catheter overlies the right mid hemiabdomen.  Multiple surgical clips overlie the pelvis.  Lumbar spine degenerative change.  IMPRESSION:  Marked gaseous distension of predominately the small bowel, though distal colonic gas and stool is identified.  While these findings may suggest ileus, early/partial small bowel obstruction is not excluded.  Continued attention on follow-up is recommended. Note this examination is nondiagnostic for the evaluation of pneumoperitoneum.  If this is of clinical concern, either upright or left lateral decubitus abdominal radiographs are recommended.  This was made a call report.  Original Report Authenticated By: Waynard Reeds, M.D.   Ir Perc Nephrostomy Right  06/22/2012  *RADIOLOGY REPORT*  Clinical history:78 year-old with severe right hydronephrosis.  The patient has had a cystectomy and a urinary diversion.  PROCEDURE(S): PERCUTANEOUS RIGHT NEPHROSTOMY TUBE WITH ULTRASOUND AND FLUOROSCOPIC GUIDANCE  Physician: Rachelle Hora. Henn, MD  Medications:Versed 0.5 mg, Fentanyl 150 mcg. A radiology nurse monitored the patient for moderate sedation.  Moderate sedation time:35 minutes  Fluoroscopy time: 2.7 minutes  Procedure:The procedure was explained to the patient.  The risks and benefits of the procedure were discussed and the  patient's questions were addressed.  Informed consent was obtained from the patient.  The patient was placed in the left lateral decubitus position.   Severe right hydronephrosis was identified.  The right flank was prepped and draped in a sterile fashion.  Maximal barrier sterile technique was utilized including caps, mask, sterile gowns, sterile gloves, sterile drape, hand hygiene and skin antiseptic. The skin was anesthetized with 1% lidocaine.  Using ultrasound guidance, 21 gauge needle was directed into a dilated calix. Cloudy fluid was identified.  A 0.018 wire was advanced and a dilator set was placed.  Cloudy clay colored urine was aspirated. The tract was dilated and  a 10-French multipurpose drain was placed over a J wire.  200 ml of clay colored urine was removed.  Catheter was reconstituted in the region of the renal pelvis.  Catheter sutured to the skin and attached to a gravity bag.Fluoroscopic and ultrasound images were taken and saved for documentation.  Findings:Severe right hydronephrosis.  200 ml of clay colored urine was removed.  Findings are concerning for pyonephrosis.  Fluid was sent for culture.  Complications: None  Impression:Successful placement of a right percutaneous nephrostomy tube using ultrasound and fluoroscopic guidance.  Urine sample sent for culture.  Original Report Authenticated By: Richarda Overlie, M.D.   Ir US Guide Bx Asp/drain  06/22/2012  *RADIOLOGY REPORT*  Clinical history:77 year-old with severe right hydronephrosis.  The patient has had a cystectomy and a urinary diversion.  PROCEDURE(S): PERCUTANEOUS RIGHT NEPHROSTOMY TUBE WITH ULTRASOUND AND FLUOROSCOPIC GUIDANCE  Physician: Rachelle Hora. Henn, MD  Medications:Versed 0.5 mg, Fentanyl 150 mcg. A radiology nurse monitored the patient for moderate sedation.  Moderate sedation time:35 minutes  Fluoroscopy time: 2.7 minutes  Procedure:The procedure was explained to the patient.  The risks and benefits of the procedure were  discussed and the patient's questions were addressed.  Informed consent was obtained from the patient.  The patient was placed in the left lateral decubitus position.   Severe right hydronephrosis was identified.  The right flank was prepped and draped in a sterile fashion.  Maximal barrier sterile technique was utilized including caps, mask, sterile gowns, sterile gloves, sterile drape, hand hygiene and skin antiseptic. The skin was anesthetized with 1% lidocaine.  Using ultrasound guidance, 21 gauge needle was directed into a dilated calix. Cloudy fluid was identified.  A 0.018 wire was advanced and a dilator set was placed.  Cloudy clay colored urine was aspirated. The tract was dilated and a 10-French multipurpose drain was placed over a J wire.  200 ml of clay colored urine was removed.  Catheter was reconstituted in the region of the renal pelvis.  Catheter sutured to the skin and attached to a gravity bag.Fluoroscopic and ultrasound images were taken and saved for documentation.  Findings:Severe right hydronephrosis.  200 ml of clay colored urine was removed.  Findings are concerning for pyonephrosis.  Fluid was sent for culture.  Complications: None  Impression:Successful placement of a right percutaneous nephrostomy tube using ultrasound and fluoroscopic guidance.  Urine sample sent for culture.  Original Report Authenticated By: Richarda Overlie, M.D.    Assessment/Plan:   No obvious significant clinical change. Good output from the nephrostomy tube with clear urine and improving renal function. Patient hopefully can have his nephrostomy tube internalized to a JJ stent on Monday.   LOS: 3 days   Aundrey Elahi S 06/24/2012, 10:19 AM

## 2012-06-24 NOTE — Progress Notes (Signed)
Pt abdomin distended. Hypoactive bowel sounds. 300 cc of bile colored emesis noted. Paged Annye English  NP made her aware, order given to place NG tube. The pt continues to refruse NG tube placement. Will continue to monitor and educate.

## 2012-06-24 NOTE — Consult Note (Signed)
Reason for Consult:Ileus vs SBO Referring Physician: [name] is an 78 y.o. male.  HPI: I was asked by Dr. Benjamine Mola to evaluate this patient. He is an 78 year old male known to our service from multiple previous admissions. 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 He has a history of parastomal hernia repaired laparoscopically at [facility] in the past and then subsequent small bowel obstruction secondary to peristomal hernia operated upon here.  He has a known recurrent peristomal hernia and has seen Dr. Michaell Cowing electively for consideration for repair was not felt to be an adequate surgical candidate. The patient has required a number of hospitalizations for urinary issues. He has a history of ileus abdominal distention with narcotics. His son states that on at least 3 hospitalizations for urinary issues he has received pain medication and develops abdominal distention and vomiting requiring NG suction that then resolved. The patient was admitted several days ago with right flank pain and on CT scan was found to have hydronephrosis on the right. He has subsequently undergone percutaneous nephrostomy and stent placement is planned. Admission CT scan showed a stable left-sided parastomal hernia containing non-dilated bowel loops and a stable small pelvic floor hernia. Over the last 2-3 days he has developed progressive abdominal distention and vomiting. He has no stoma output for 2 days. NG tube was placed. He denies any abdominal pain. The patient had been doing well at home and reasonably active prior to this admission.  Past Medical History  Diagnosis Date  . History of colostomy   . Urostomy stenosis     Ask patient to clarify, per history form dated 04/02/11.  Marland Kitchen Peristomal hernia   . Prostate cancer   . Skin cancer   . Neuropathy     in feet   . COPD (chronic obstructive  pulmonary disease)   . Depression   . UTI (lower urinary tract infection)     history     Past Surgical History  Procedure Date  . Cystectomy, ileal coduit, colostomy 2002    for severe rectourethral fistula  . Ex lap, lysis of adhesions 2010    for SBO  . Lap repair of parastomal hernias 2009    Dr. Barnetta Chapel [facility],  Hematoma evac POD#1    History reviewed. No pertinent family history.  Social History:  reports that he has quit smoking. His smoking use included Cigarettes. He quit after 37 years of use. He has never used smokeless tobacco. He reports that he drinks alcohol. He reports that he does not use illicit drugs.  Allergies:  Allergies  Allergen Reactions  . Amoxicillin-Pot Clavulanate    Current Facility-Administered Medications  Medication Dose Route Frequency Provider Last Rate Last Dose  . 0.9 %  sodium chloride infusion  1,000 mL Intravenous Continuous Ethelda Chick, MD 50 mL/hr at 06/24/12 0400 1,000 mL at 06/24/12 0400  . acetaminophen (TYLENOL) tablet 650 mg  650 mg Oral Q6H PRN Mallie Darting, PA   650 mg at 06/23/12 0746  . alum & mag hydroxide-simeth (MAALOX/MYLANTA) 200-200-20 MG/5ML suspension 30 mL  30 mL Oral Daily Houston Siren, MD   30 mL at 06/23/12 0926  . antiseptic oral rinse (BIOTENE) solution 15 mL  15 mL Mouth Rinse BID Houston Siren, MD   15 mL at 06/24/12 0816  . aspirin EC tablet 81 mg  81 mg Oral Daily Houston Siren, MD  81 mg at 06/23/12 0924  . buPROPion [facility] SR) 12 hr tablet 150 mg  150 mg Oral BID Houston Siren, MD   150 mg at 06/23/12 2145  . ciprofloxacin (CIPRO) IVPB 400 mg  400 mg Intravenous Q12H Houston Siren, MD   400 mg at 06/24/12 0816  . gabapentin (NEURONTIN) capsule 300 mg  300 mg Oral TID Houston Siren, MD   300 mg at 06/23/12 1502  . guaiFENesin (MUCINEX) 12 hr tablet 600 mg  600 mg Oral BID Joseph Art, DO   600 mg at 06/23/12 2146  . heparin injection 5,000 Units  5,000 Units Subcutaneous Q8H Houston Siren, MD   5,000 Units at 06/24/12 0551  .  metoCLOPramide (REGLAN) injection 5 mg  5 mg Intravenous Q8H Jessica U Vann, DO   5 mg at 06/24/12 0551  . morphine 4 MG/ML injection 4 mg  4 mg Intravenous Q4H PRN Houston Siren, MD   4 mg at 06/23/12 1210  . multivitamin with minerals tablet 1 tablet  1 tablet Oral Daily Houston Siren, MD   1 tablet at 06/23/12 0925  . omega-3 acid ethyl esters (LOVAZA) capsule 1 g  1 g Oral BID Houston Siren, MD   1 g at 06/23/12 2146  . ondansetron (ZOFRAN) tablet 4 mg  4 mg Oral Q6H PRN Houston Siren, MD       Or  . ondansetron [facility]) injection 4 mg  4 mg Intravenous Q6H PRN Houston Siren, MD   4 mg at 06/23/12 1504  . promethazine (PHENERGAN) injection 12.5 mg  12.5 mg Intravenous Q8H PRN Joseph Art, DO   12.5 mg at 06/23/12 1613  . simvastatin (ZOCOR) tablet 20 mg  20 mg Oral QHS Houston Siren, MD   20 mg at 06/23/12 2146  . sodium phosphate (FLEET) 7-19 GM/118ML enema 1 enema  1 enema Rectal Once Joseph Art, DO        :  Prescriptions prior to admission  Medication Sig Dispense Refill  . aspirin 81 MG tablet Take 81 mg by mouth daily. Ask patient to clarify dosage. Not indicated on history form dated 04/02/11.       Marland Kitchen buPROPion (WELLBUTRIN SR) 150 MG 12 hr tablet Take 150 mg by mouth 2 (two) times daily.        . calcium & magnesium carbonates (MYLANTA) 311-232 MG per tablet Take 1 tablet by mouth daily.        . ciprofloxacin (CIPRO) 500 MG tablet Take 500 mg by mouth 2 (two) times daily. For 7 days. Take at onset of kidney pain.      Marland Kitchen gabapentin (NEURONTIN) 300 MG capsule Take 300 mg by mouth 3 (three) times daily.        Marland Kitchen glucosamine-chondroitin 500-400 MG tablet Take 1 tablet by mouth 3 (three) times daily.      . Multiple Vitamin (MULTIVITAMIN) tablet Take 1 tablet by mouth daily.        . nitrofurantoin (MACRODANTIN) 50 MG capsule Take 50 mg by mouth at bedtime.      Marland Kitchen omega-3 acid ethyl esters (LOVAZA) 1 G capsule Take 1 g by mouth 2 (two) times daily.      . simvastatin (ZOCOR) 20 MG tablet Take 20 mg by mouth at  bedtime.          Results for orders placed during the hospital encounter of 06/21/12 (from the past 48 hour(s))  BODY FLUID CULTURE     Status: Normal (  Preliminary result)   Collection Time   06/22/12  3:35 PM      Component Value Range Comment   Specimen Description KIDNEY URINE      Special Requests NONE      Gram Stain        Value: MODERATE WBC PRESENT,BOTH PMN AND MONONUCLEAR     NO ORGANISMS SEEN   Culture NO GROWTH      Report Status PENDING     CULTURE, BLOOD (ROUTINE X 2)     Status: Normal (Preliminary result)   Collection Time   06/22/12  8:15 PM      Component Value Range Comment   Specimen Description BLOOD RIGHT ARM      Special Requests BOTTLES DRAWN AEROBIC AND ANAEROBIC 10CC      Culture  Setup Time 06/23/2012 01:34      Culture        Value:        BLOOD CULTURE RECEIVED NO GROWTH TO DATE CULTURE WILL BE HELD FOR 5 DAYS BEFORE ISSUING A FINAL NEGATIVE REPORT   Report Status PENDING     CULTURE, BLOOD (ROUTINE X 2)     Status: Normal (Preliminary result)   Collection Time   06/22/12  8:20 PM      Component Value Range Comment   Specimen Description BLOOD RIGHT ARM      Special Requests BOTTLES DRAWN AEROBIC AND ANAEROBIC 10CC      Culture  Setup Time 06/23/2012 01:35      Culture        Value:        BLOOD CULTURE RECEIVED NO GROWTH TO DATE CULTURE WILL BE HELD FOR 5 DAYS BEFORE ISSUING A FINAL NEGATIVE REPORT   Report Status PENDING     CBC     Status: Normal   Collection Time   06/24/12  4:20 AM      Component Value Range Comment   WBC 7.3  4.0 - 10.5 K/uL    RBC 4.31  4.22 - 5.81 MIL/uL    Hemoglobin 13.2  13.0 - 17.0 g/dL    HCT 27.2  53.6 - 64.4 %    MCV 90.5  78.0 - 100.0 fL    MCH 30.6  26.0 - 34.0 pg    MCHC 33.8  30.0 - 36.0 g/dL    RDW 03.4  74.2 - 59.5 %    Platelets 156  150 - 400 K/uL   BASIC METABOLIC PANEL     Status: Abnormal   Collection Time   06/24/12  4:20 AM      Component Value Range Comment   Sodium 136  135 - 145 mEq/L     Potassium 4.2  3.5 - 5.1 mEq/L    Chloride 99  96 - 112 mEq/L    CO2 28  19 - 32 mEq/L    Glucose, Bld 130 (*) 70 - 99 mg/dL    BUN 23  6 - 23 mg/dL    Creatinine, Ser 6.38 (*) 0.50 - 1.35 mg/dL    Calcium 9.1  8.4 - 75.6 mg/dL    GFR calc non Af Amer 45 (*) >90 mL/min    GFR calc Af Amer 52 (*) >90 mL/min     Dg Abd 1 View  06/24/2012  *RADIOLOGY REPORT*  Clinical Data: Follow up ileus  ABDOMEN - 1 VIEW  Comparison: 06/23/2012  Findings: Persistent gaseous distended small bowel loops mid abdomen consistent with ileus or partial small bowel  obstruction. Stable nephrostomy catheter in the right abdomen NG tube in place with tip in proximal duodenum region.  Stool noted in the right colon and sigmoid colon.  IMPRESSION: Persistent gaseous distended small bowel loops mid abdomen consistent with ileus or partial small bowel obstruction. NG tube in place.  Original Report Authenticated By: Natasha Mead, M.D.   Dg Abd 1 View  06/23/2012  *RADIOLOGY REPORT*  Clinical Data: Evaluate for ileus  ABDOMEN - 1 VIEW  Comparison: Right-sided percutaneous nephrostomy tube placement - 06/22/2012; Abdominal CT - 06/21/2012  Findings:  There is marked dilatation of multiple loops of small bowel.  A small amount of air is seen within the cecum and ascending colon. Moderate colonic stool burden.  Evaluation for pneumoperitoneum is limited secondary to supine patient positioning and exclusion of the lower thorax. Recently placed percutaneous nephrostomy catheter overlies the right mid hemiabdomen.  Multiple surgical clips overlie the pelvis.  Lumbar spine degenerative change.  IMPRESSION:  Marked gaseous distension of predominately the small bowel, though distal colonic gas and stool is identified.  While these findings may suggest ileus, early/partial small bowel obstruction is not excluded.  Continued attention on follow-up is recommended. Note this examination is nondiagnostic for the evaluation of pneumoperitoneum.  If  this is of clinical concern, either upright or left lateral decubitus abdominal radiographs are recommended.  This was made a call report.  Original Report Authenticated By: Waynard Reeds, M.D.   Ir Perc Nephrostomy Right  06/22/2012  *RADIOLOGY REPORT*  Clinical history:78 year-old with severe right hydronephrosis.  The patient has had a cystectomy and a urinary diversion.  PROCEDURE(S): PERCUTANEOUS RIGHT NEPHROSTOMY TUBE WITH ULTRASOUND AND FLUOROSCOPIC GUIDANCE  Physician: Rachelle Hora. Henn, MD  Medications:Versed 0.5 mg, Fentanyl 150 mcg. A radiology nurse monitored the patient for moderate sedation.  Moderate sedation time:35 minutes  Fluoroscopy time: 2.7 minutes  Procedure:The procedure was explained to the patient.  The risks and benefits of the procedure were discussed and the patient's questions were addressed.  Informed consent was obtained from the patient.  The patient was placed in the left lateral decubitus position.   Severe right hydronephrosis was identified.  The right flank was prepped and draped in a sterile fashion.  Maximal barrier sterile technique was utilized including caps, mask, sterile gowns, sterile gloves, sterile drape, hand hygiene and skin antiseptic. The skin was anesthetized with 1% lidocaine.  Using ultrasound guidance, 21 gauge needle was directed into a dilated calix. Cloudy fluid was identified.  A 0.018 wire was advanced and a dilator set was placed.  Cloudy clay colored urine was aspirated. The tract was dilated and a 10-French multipurpose drain was placed over a J wire.  200 ml of clay colored urine was removed.  Catheter was reconstituted in the region of the renal pelvis.  Catheter sutured to the skin and attached to a gravity bag.Fluoroscopic and ultrasound images were taken and saved for documentation.  Findings:Severe right hydronephrosis.  200 ml of clay colored urine was removed.  Findings are concerning for pyonephrosis.  Fluid was sent for culture.   Complications: None  Impression:Successful placement of a right percutaneous nephrostomy tube using ultrasound and fluoroscopic guidance.  Urine sample sent for culture.  Original Report Authenticated By: Richarda Overlie, M.D.   Ir US Guide Bx Asp/drain  06/22/2012  *RADIOLOGY REPORT*  Clinical history:78 year-old with severe right hydronephrosis.  The patient has had a cystectomy and a urinary diversion.  PROCEDURE(S): PERCUTANEOUS RIGHT NEPHROSTOMY TUBE WITH ULTRASOUND AND  FLUOROSCOPIC GUIDANCE  Physician: Rachelle Hora. Henn, MD  Medications:Versed 0.5 mg, Fentanyl 150 mcg. A radiology nurse monitored the patient for moderate sedation.  Moderate sedation time:35 minutes  Fluoroscopy time: 2.7 minutes  Procedure:The procedure was explained to the patient.  The risks and benefits of the procedure were discussed and the patient's questions were addressed.  Informed consent was obtained from the patient.  The patient was placed in the left lateral decubitus position.   Severe right hydronephrosis was identified.  The right flank was prepped and draped in a sterile fashion.  Maximal barrier sterile technique was utilized including caps, mask, sterile gowns, sterile gloves, sterile drape, hand hygiene and skin antiseptic. The skin was anesthetized with 1% lidocaine.  Using ultrasound guidance, 21 gauge needle was directed into a dilated calix. Cloudy fluid was identified.  A 0.018 wire was advanced and a dilator set was placed.  Cloudy clay colored urine was aspirated. The tract was dilated and a 10-French multipurpose drain was placed over a J wire.  200 ml of clay colored urine was removed.  Catheter was reconstituted in the region of the renal pelvis.  Catheter sutured to the skin and attached to a gravity bag.Fluoroscopic and ultrasound images were taken and saved for documentation.  Findings:Severe right hydronephrosis.  200 ml of clay colored urine was removed.  Findings are concerning for pyonephrosis.  Fluid was sent for  culture.  Complications: None  Impression:Successful placement of a right percutaneous nephrostomy tube using ultrasound and fluoroscopic guidance.  Urine sample sent for culture.  Original Report Authenticated By: Richarda Overlie, M.D.    Review of Systems  Constitutional: Negative for fever and chills.  HENT: Positive for congestion.   Respiratory: Positive for cough and wheezing.   Cardiovascular: Negative for chest pain and leg swelling.  Gastrointestinal: Positive for nausea and vomiting. Negative for abdominal pain.   Blood pressure 157/84, pulse 80, temperature 98.6 F (37 C), temperature source Oral, resp. rate 18, height 6\' 2"  (1.88 m), weight 218 lb 0.6 oz (98.9 kg), SpO2 92.00%. Physical Exam General: Alert elderly white male in no acute distress Skin: Warm and dry without rash or infection HEENT: No palpable masses. Sclera nonicteric. Lungs: Clear equal breath sounds bilaterally without increased work of breathing Cardiac: Regular rate and rhythm. No JVD or edema. Abdomen: Healed long midline incision. There is a colostomy in the left lower quadrant and urostomy in the right lower quadrant. His abdomen is distended and tympanitic. There is a good sized peristomal hernia on the left which is relatively soft and nontender. There is no abdominal tenderness elsewhere. No other hernias palpable. No discernible masses or organomegaly. Extremities: No edema or deformity Neurologic: He is alert and fully oriented. No gross motor deficits.  Assessment/Plan: I have personally reviewed his imaging. Current x-rays are consistent with either bowel obstruction or ileus. He does have some gas and stool in the colon. He has a history of recurrent episodes of ileus secondary to narcotics and I think this is most likely particularly with no crampy abdominal pain or tenderness. Cannot however rule out a mechanical bowel obstruction. He appears very stable. He would be a difficult surgical candidate for  multiple reasons. I would agree with the current plan of bowel rest and NG suction. Would try to minimize narcotics. If he does not improve in the next couple of days I would consider repeating a CT scan with contrast per NG to try to discern mechanical obstruction versus ileus. We will follow  with you.  Michael Novak 06/24/2012, 12:20 PM

## 2012-06-24 NOTE — Progress Notes (Addendum)
Subjective: Pt ok. Now with apparent ileus/PSBO and had NG placed after several episodes of N/V.   Objective: Physical Exam: BP 157/84  Pulse 80  Temp 98.6 F (37 C) (Oral)  Resp 18  Ht 6\' 2"  (1.88 m)  Wt 218 lb 0.6 oz (98.9 kg)  BMI 27.99 kg/m2  SpO2 92% Rt PCN looks good, site clean. Plenty UOP, slightly bl tinged, not purulent  NG output very bilious and tip noted to be in duodenum on x-ray.    Labs: CBC  Basename 06/24/12 0420 06/22/12 0349  WBC 7.3 9.2  HGB 13.2 14.1  HCT 39.0 41.2  PLT 156 161   BMET  Basename 06/24/12 0420 06/22/12 0349  NA 136 136  K 4.2 4.3  CL 99 101  CO2 28 25  GLUCOSE 130* 116*  BUN 23 29*  CREATININE 1.42* 1.67*  CALCIUM 9.1 8.9   LFT  Basename 06/21/12 1630  PROT 8.0  ALBUMIN 4.4  AST 20  ALT 16  ALKPHOS 51  BILITOT 0.6  BILIDIR --  IBILI --  LIPASE 23   PT/INR No results found for this basename: LABPROT:2,INR:2 in the last 72 hours   Studies/Results: Dg Abd 1 View  06/24/2012  *RADIOLOGY REPORT*  Clinical Data: Follow up ileus  ABDOMEN - 1 VIEW  Comparison: 06/23/2012  Findings: Persistent gaseous distended small bowel loops mid abdomen consistent with ileus or partial small bowel obstruction. Stable nephrostomy catheter in the right abdomen NG tube in place with tip in proximal duodenum region.  Stool noted in the right colon and sigmoid colon.  IMPRESSION: Persistent gaseous distended small bowel loops mid abdomen consistent with ileus or partial small bowel obstruction. NG tube in place.  Original Report Authenticated By: Natasha Mead, M.D.   Dg Abd 1 View  06/23/2012  *RADIOLOGY REPORT*  Clinical Data: Evaluate for ileus  ABDOMEN - 1 VIEW  Comparison: Right-sided percutaneous nephrostomy tube placement - 06/22/2012; Abdominal CT - 06/21/2012  Findings:  There is marked dilatation of multiple loops of small bowel.  A small amount of air is seen within the cecum and ascending colon. Moderate colonic stool burden.   Evaluation for pneumoperitoneum is limited secondary to supine patient positioning and exclusion of the lower thorax. Recently placed percutaneous nephrostomy catheter overlies the right mid hemiabdomen.  Multiple surgical clips overlie the pelvis.  Lumbar spine degenerative change.  IMPRESSION:  Marked gaseous distension of predominately the small bowel, though distal colonic gas and stool is identified.  While these findings may suggest ileus, early/partial small bowel obstruction is not excluded.  Continued attention on follow-up is recommended. Note this examination is nondiagnostic for the evaluation of pneumoperitoneum.  If this is of clinical concern, either upright or left lateral decubitus abdominal radiographs are recommended.  This was made a call report.  Original Report Authenticated By: Waynard Reeds, M.D.   Ir Perc Nephrostomy Right  06/22/2012  *RADIOLOGY REPORT*  Clinical history:78 year-old with severe right hydronephrosis.  The patient has had a cystectomy and a urinary diversion.  PROCEDURE(S): PERCUTANEOUS RIGHT NEPHROSTOMY TUBE WITH ULTRASOUND AND FLUOROSCOPIC GUIDANCE  Physician: Rachelle Hora. Henn, MD  Medications:Versed 0.5 mg, Fentanyl 150 mcg. A radiology nurse monitored the patient for moderate sedation.  Moderate sedation time:35 minutes  Fluoroscopy time: 2.7 minutes  Procedure:The procedure was explained to the patient.  The risks and benefits of the procedure were discussed and the patient's questions were addressed.  Informed consent was obtained from the patient.  The patient  was placed in the left lateral decubitus position.   Severe right hydronephrosis was identified.  The right flank was prepped and draped in a sterile fashion.  Maximal barrier sterile technique was utilized including caps, mask, sterile gowns, sterile gloves, sterile drape, hand hygiene and skin antiseptic. The skin was anesthetized with 1% lidocaine.  Using ultrasound guidance, 21 gauge needle was directed into  a dilated calix. Cloudy fluid was identified.  A 0.018 wire was advanced and a dilator set was placed.  Cloudy clay colored urine was aspirated. The tract was dilated and a 10-French multipurpose drain was placed over a J wire.  200 ml of clay colored urine was removed.  Catheter was reconstituted in the region of the renal pelvis.  Catheter sutured to the skin and attached to a gravity bag.Fluoroscopic and ultrasound images were taken and saved for documentation.  Findings:Severe right hydronephrosis.  200 ml of clay colored urine was removed.  Findings are concerning for pyonephrosis.  Fluid was sent for culture.  Complications: None  Impression:Successful placement of a right percutaneous nephrostomy tube using ultrasound and fluoroscopic guidance.  Urine sample sent for culture.  Original Report Authenticated By: Richarda Overlie, M.D.   Ir US Guide Bx Asp/drain  06/22/2012  *RADIOLOGY REPORT*  Clinical history:78 year-old with severe right hydronephrosis.  The patient has had a cystectomy and a urinary diversion.  PROCEDURE(S): PERCUTANEOUS RIGHT NEPHROSTOMY TUBE WITH ULTRASOUND AND FLUOROSCOPIC GUIDANCE  Physician: Rachelle Hora. Henn, MD  Medications:Versed 0.5 mg, Fentanyl 150 mcg. A radiology nurse monitored the patient for moderate sedation.  Moderate sedation time:35 minutes  Fluoroscopy time: 2.7 minutes  Procedure:The procedure was explained to the patient.  The risks and benefits of the procedure were discussed and the patient's questions were addressed.  Informed consent was obtained from the patient.  The patient was placed in the left lateral decubitus position.   Severe right hydronephrosis was identified.  The right flank was prepped and draped in a sterile fashion.  Maximal barrier sterile technique was utilized including caps, mask, sterile gowns, sterile gloves, sterile drape, hand hygiene and skin antiseptic. The skin was anesthetized with 1% lidocaine.  Using ultrasound guidance, 21 gauge needle was  directed into a dilated calix. Cloudy fluid was identified.  A 0.018 wire was advanced and a dilator set was placed.  Cloudy clay colored urine was aspirated. The tract was dilated and a 10-French multipurpose drain was placed over a J wire.  200 ml of clay colored urine was removed.  Catheter was reconstituted in the region of the renal pelvis.  Catheter sutured to the skin and attached to a gravity bag.Fluoroscopic and ultrasound images were taken and saved for documentation.  Findings:Severe right hydronephrosis.  200 ml of clay colored urine was removed.  Findings are concerning for pyonephrosis.  Fluid was sent for culture.  Complications: None  Impression:Successful placement of a right percutaneous nephrostomy tube using ultrasound and fluoroscopic guidance.  Urine sample sent for culture.  Original Report Authenticated By: Richarda Overlie, M.D.    Assessment/Plan: Right hydronephrosis s/p Rt PCN placement by IR 7/25.  Creatinine improving.  Follow cultures - treat appropriately pending final results.  Appears to be close to ready for internalization perhaps Monday. IR to continue to follow along.  Consider pulling NG back several cm so that tip lies in stomach.   LOS: 3 days    Brayton El PA-C 06/24/2012 10:46 AM

## 2012-06-24 NOTE — Progress Notes (Signed)
After numerous episodes of N/V. The Pt has agreed to have NG placed. 400 cc of dark greenish bile was the residual . Placement checked. Pt had a abdominal xray this am. Pt tol procedure well. Will monitor and have am RN follow up.

## 2012-06-24 NOTE — Progress Notes (Addendum)
[facility] PROGRESS NOTE  Michael Novak:[id number] DOB: June 25, 1930 DOA: 06/21/2012 PCP: Kimber Relic, MD  Assessment/Plan: Principal Problem:  *Pyelonephritis Active Problems:  Prostate cancer  COPD (chronic obstructive pulmonary disease)  Hydronephrosis, right  AAA (abdominal aortic aneurysm)  1. Worsening hydronephrosis- s/p placement of a R nephrostomy tube with a possible double J stent placement later- culture of urine sent- abx: cipro (may need to change based on culture- has grown kleb pna and enterococcus in the past)  2. Ileus/SBO- NG tube placed for vomiting- surgery consulted as distention not improved with NG tube placement- greenish bile out 3. Pyelonephritis- culture urine 4. COPD- stable 5. AAA- follow up with vascular surgery as an outpatient 6. Prostate Ca  Code Status: full Family Communication: none Disposition Plan: home when better    Consultants:  urology  HPI/Subjective: Nausea better after NG tube placed   Objective: Filed Vitals:   06/23/12 0624 06/23/12 1325 06/23/12 2211 06/24/12 0624  BP:  128/72 149/95 157/84  Pulse: 70 73 85 80  Temp:  98.8 F (37.1 C) 98.9 F (37.2 C) 98.6 F (37 C)  TempSrc:  Oral Oral Oral  Resp:  18 18 18   Height:      Weight:      SpO2: 90% 90% 91% 92%    Intake/Output Summary (Last 24 hours) at 06/24/12 0958 Last data filed at 06/24/12 0400  Gross per 24 hour  Intake   1360 ml  Output   2220 ml  Net   -860 ml    Exam:  GEN: uncomfortable appearing, NG tube in place  CHEST: Normal respiration, clear to auscultation bilaterally.  HEART: Regular rate and rhythm. There are no murmur, rub, or gallops.  ABDOMEN: slightly distended, decreased BS. He has a colostomy on the left and a urostomy on the right  EXTREMITIES: No bone or joint deformity; age-appropriate arthropathy of the hands and knees; no edema; no ulcerations. There is no calf tenderness.  SKIN: Normal hydration no rash or  ulceration    Data Reviewed: Basic Metabolic Panel:  Lab 06/24/12 8119 06/22/12 0349 06/21/12 1630  NA 136 136 137  K 4.2 4.3 4.6  CL 99 101 99  CO2 28 25 21   GLUCOSE 130* 116* 130*  BUN 23 29* 30*  CREATININE 1.42* 1.67* 1.48*  CALCIUM 9.1 8.9 9.9  MG -- -- --  PHOS -- -- --   Liver Function Tests:  Lab 06/21/12 1630  AST 20  ALT 16  ALKPHOS 51  BILITOT 0.6  PROT 8.0  ALBUMIN 4.4    Lab 06/21/12 1630  LIPASE 23  AMYLASE --   No results found for this basename: AMMONIA:5 in the last 168 hours CBC:  Lab 06/24/12 0420 06/22/12 0349 06/21/12 1630  WBC 7.3 9.2 9.3  NEUTROABS -- -- 7.5  HGB 13.2 14.1 16.0  HCT 39.0 41.2 45.7  MCV 90.5 89.6 89.3  PLT 156 161 176   Cardiac Enzymes: No results found for this basename: CKTOTAL:5,CKMB:5,CKMBINDEX:5,TROPONINI:5 in the last 168 hours BNP (last 3 results) No results found for this basename: PROBNP:3 in the last 8760 hours CBG: No results found for this basename: GLUCAP:5 in the last 168 hours  Recent Results (from the past 240 hour(s))  URINE CULTURE     Status: Normal   Collection Time   06/21/12  6:57 PM      Component Value Range Status Comment   Specimen Description URINE, CLEAN CATCH   Final  Special Requests NONE   Final    Culture  Setup Time 06/22/2012 03:21   Final    Colony Count NO GROWTH   Final    Culture NO GROWTH   Final    Report Status 06/22/2012 FINAL   Final   BODY FLUID CULTURE     Status: Normal (Preliminary result)   Collection Time   06/22/12  3:35 PM      Component Value Range Status Comment   Specimen Description KIDNEY URINE   Final    Special Requests NONE   Final    Gram Stain     Final    Value: MODERATE WBC PRESENT,BOTH PMN AND MONONUCLEAR     NO ORGANISMS SEEN   Culture NO GROWTH   Final    Report Status PENDING   Incomplete   CULTURE, BLOOD (ROUTINE X 2)     Status: Normal (Preliminary result)   Collection Time   06/22/12  8:15 PM      Component Value Range Status Comment     Specimen Description BLOOD RIGHT ARM   Final    Special Requests BOTTLES DRAWN AEROBIC AND ANAEROBIC 10CC   Final    Culture  Setup Time 06/23/2012 01:34   Final    Culture     Final    Value:        BLOOD CULTURE RECEIVED NO GROWTH TO DATE CULTURE WILL BE HELD FOR 5 DAYS BEFORE ISSUING A FINAL NEGATIVE REPORT   Report Status PENDING   Incomplete   CULTURE, BLOOD (ROUTINE X 2)     Status: Normal (Preliminary result)   Collection Time   06/22/12  8:20 PM      Component Value Range Status Comment   Specimen Description BLOOD RIGHT ARM   Final    Special Requests BOTTLES DRAWN AEROBIC AND ANAEROBIC 10CC   Final    Culture  Setup Time 06/23/2012 01:35   Final    Culture     Final    Value:        BLOOD CULTURE RECEIVED NO GROWTH TO DATE CULTURE WILL BE HELD FOR 5 DAYS BEFORE ISSUING A FINAL NEGATIVE REPORT   Report Status PENDING   Incomplete      Studies: Ct Abdomen Pelvis Wo Contrast  06/21/2012  *RADIOLOGY REPORT*  Clinical Data: Right flank pain  CT ABDOMEN AND PELVIS WITHOUT CONTRAST  Technique:  Multidetector CT imaging of the abdomen and pelvis was performed following the standard protocol without intravenous contrast.  Comparison: 12/04/2009  Findings: Stable cholelithiasis.  Chronic changes at the lung bases.  There is mild left hydronephrosis.  Right hydronephrosis has worsened.  There is significant perinephric stranding about the right kidney.  The patient is status post cystectomy with right lower quadrant ileal conduit.  Coronary artery calcifications are noted at the lung bases.  Extensive stool throughout the length of the colon is noted.  Left lower quadrant colostomy is in place.  Peristomal hernia containing several loops of colon is noted.  Small hernia through the pelvic floor on the right containing a small segment of small bowel is not significantly changed.  No disproportionate dilatation of small bowel to suggest obstruction.  Infrarenal abdominal aortic aneurysm is  present.  Maximal AP diameter is 5.0 cm.  Previously, this was 4.4 cm.  Stable L1 compression deformity.  No definite evidence of acute appendicitis.  IMPRESSION: Compared the prior study, right hydronephrosis has worsened and right perinephric stranding has worsened.  No  ureteral calculi are seen.  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  Previously noted small bowel obstruction pattern has resolved.  Infrarenal abdominal aortic aneurysm has increased from 4.4 cm to 5.0 cm.  Exam is otherwise unchanged.  Original Report Authenticated By: Donavan Burnet, M.D.    Scheduled Meds:    . alum & mag hydroxide-simeth  30 mL Oral Daily  . antiseptic oral rinse  15 mL Mouth Rinse BID  . aspirin EC  81 mg Oral Daily  . buPROPion  150 mg Oral BID  . ciprofloxacin  400 mg Intravenous Q12H  . gabapentin  300 mg Oral TID  . guaiFENesin  600 mg Oral BID  . heparin  5,000 Units Subcutaneous Q8H  . metoCLOPramide (REGLAN) injection  5 mg Intravenous Q8H  . multivitamin with minerals  1 tablet Oral Daily  . omega-3 acid ethyl esters  1 g Oral BID  . simvastatin  20 mg Oral QHS  . sodium phosphate  1 enema Rectal Once   Continuous Infusions:    . sodium chloride 1,000 mL (06/24/12 0400)    Principal Problem:  *Pyelonephritis Active Problems:  Prostate cancer  COPD (chronic obstructive pulmonary disease)  Hydronephrosis, right  AAA (abdominal aortic aneurysm)    Time spent: 30    [facility], Lisa Milian  [facility] Pager [phone] . 06/24/2012, 9:58 AM  LOS: 3 days

## 2012-06-25 ENCOUNTER — Inpatient Hospital Stay (HOSPITAL_COMMUNITY): Payer: Medicare Other

## 2012-06-25 LAB — BODY FLUID CULTURE

## 2012-06-25 NOTE — Progress Notes (Signed)
Patient ID: Michael Novak, male   DOB: 02-16-1930, 78 y.o.   MRN: [id number]    Subjective: Feels better with less abdominal tightness.  Denies pain.  No significant flatus or BM per ostomy  Objective: Vital signs in last 24 hours: Temp:  [98.3 F (36.8 C)-99.5 F (37.5 C)] 99.5 F (37.5 C) (07/28 0455) Pulse Rate:  [77-83] 83  (07/28 0455) Resp:  [18] 18  (07/28 0455) BP: (127-149)/(72-80) 135/80 mmHg (07/28 0455) SpO2:  [90 %-93 %] 91 % (07/28 0455) Last BM Date: 06/21/12  Intake/Output from previous day: 07/27 0701 - 07/28 0700 In: 10  Out: 1650 [Urine:950; Emesis/NG output:700] Intake/Output this shift:    General appearance: alert and no distress GI: Much less distended, soft, non tender.  Para stomal hernia is soft, non tender and reducible  Lab Results:   Basename 06/24/12 0420  WBC 7.3  HGB 13.2  HCT 39.0  PLT 156   BMET  Basename 06/24/12 0420  NA 136  K 4.2  CL 99  CO2 28  GLUCOSE 130*  BUN 23  CREATININE 1.42*  CALCIUM 9.1     Studies/Results: Dg Abd 1 View  06/24/2012  *RADIOLOGY REPORT*  Clinical Data: Follow up ileus  ABDOMEN - 1 VIEW  Comparison: 06/23/2012  Findings: Persistent gaseous distended small bowel loops mid abdomen consistent with ileus or partial small bowel obstruction. Stable nephrostomy catheter in the right abdomen NG tube in place with tip in proximal duodenum region.  Stool noted in the right colon and sigmoid colon.  IMPRESSION: Persistent gaseous distended small bowel loops mid abdomen consistent with ileus or partial small bowel obstruction. NG tube in place.  Original Report Authenticated By: Natasha Mead, M.D.   Dg Abd 1 View  06/23/2012  *RADIOLOGY REPORT*  Clinical Data: Evaluate for ileus  ABDOMEN - 1 VIEW  Comparison: Right-sided percutaneous nephrostomy tube placement - 06/22/2012; Abdominal CT - 06/21/2012  Findings:  There is marked dilatation of multiple loops of small bowel.  A small amount of air is seen within the  cecum and ascending colon. Moderate colonic stool burden.  Evaluation for pneumoperitoneum is limited secondary to supine patient positioning and exclusion of the lower thorax. Recently placed percutaneous nephrostomy catheter overlies the right mid hemiabdomen.  Multiple surgical clips overlie the pelvis.  Lumbar spine degenerative change.  IMPRESSION:  Marked gaseous distension of predominately the small bowel, though distal colonic gas and stool is identified.  While these findings may suggest ileus, early/partial small bowel obstruction is not excluded.  Continued attention on follow-up is recommended. Note this examination is nondiagnostic for the evaluation of pneumoperitoneum.  If this is of clinical concern, either upright or left lateral decubitus abdominal radiographs are recommended.  This was made a call report.  Original Report Authenticated By: Waynard Reeds, M.D.    Anti-infectives: Anti-infectives     Start     Dose/Rate Route Frequency Ordered Stop   06/22/12 0800   ciprofloxacin (CIPRO) IVPB 400 mg        400 mg 200 mL/hr over 60 Minutes Intravenous Every 12 hours 06/21/12 2159     06/21/12 1945   ciprofloxacin (CIPRO) IVPB 400 mg        400 mg 200 mL/hr over 60 Minutes Intravenous  Once 06/21/12 1936 06/21/12 2048          Assessment/Plan: Ileus vs SBO.  No stoma output but abdomen is benign on exam and much less distended.  Continue NG for  now, repeat xrays in AM    LOS: 4 days    Aanchal Cope T 06/25/2012

## 2012-06-25 NOTE — Progress Notes (Signed)
Subjective: Pt ok. Surgery was consulted for ileus/PSBO and feels more liely ileus from narcotics. Pt feels ok, less distended. Has had some flatus from ostomy, no BM. Denies any issues with Rt PCN.  Objective: Physical Exam: BP 135/80  Pulse 83  Temp 99.5 F (37.5 C) (Oral)  Resp 18  Ht 6\' 2"  (1.88 m)  Wt 218 lb 0.6 oz (98.9 kg)  BMI 27.99 kg/m2  SpO2 91% Rt PCN intact, site clean. Good UOP, still sl brown/bl tinged.    Labs: CBC  Basename 06/24/12 0420  WBC 7.3  HGB 13.2  HCT 39.0  PLT 156   BMET  Basename 06/24/12 0420  NA 136  K 4.2  CL 99  CO2 28  GLUCOSE 130*  BUN 23  CREATININE 1.42*  CALCIUM 9.1   LFT No results found for this basename: PROT,ALBUMIN,AST,ALT,ALKPHOS,BILITOT,BILIDIR,IBILI,LIPASE in the last 72 hours PT/INR No results found for this basename: LABPROT:2,INR:2 in the last 72 hours   Studies/Results: Dg Abd 1 View  06/24/2012  *RADIOLOGY REPORT*  Clinical Data: Follow up ileus  ABDOMEN - 1 VIEW  Comparison: 06/23/2012  Findings: Persistent gaseous distended small bowel loops mid abdomen consistent with ileus or partial small bowel obstruction. Stable nephrostomy catheter in the right abdomen NG tube in place with tip in proximal duodenum region.  Stool noted in the right colon and sigmoid colon.  IMPRESSION: Persistent gaseous distended small bowel loops mid abdomen consistent with ileus or partial small bowel obstruction. NG tube in place.  Original Report Authenticated By: Natasha Mead, M.D.   Dg Abd 1 View  06/23/2012  *RADIOLOGY REPORT*  Clinical Data: Evaluate for ileus  ABDOMEN - 1 VIEW  Comparison: Right-sided percutaneous nephrostomy tube placement - 06/22/2012; Abdominal CT - 06/21/2012  Findings:  There is marked dilatation of multiple loops of small bowel.  A small amount of air is seen within the cecum and ascending colon. Moderate colonic stool burden.  Evaluation for pneumoperitoneum is limited secondary to supine patient positioning  and exclusion of the lower thorax. Recently placed percutaneous nephrostomy catheter overlies the right mid hemiabdomen.  Multiple surgical clips overlie the pelvis.  Lumbar spine degenerative change.  IMPRESSION:  Marked gaseous distension of predominately the small bowel, though distal colonic gas and stool is identified.  While these findings may suggest ileus, early/partial small bowel obstruction is not excluded.  Continued attention on follow-up is recommended. Note this examination is nondiagnostic for the evaluation of pneumoperitoneum.  If this is of clinical concern, either upright or left lateral decubitus abdominal radiographs are recommended.  This was made a call report.  Original Report Authenticated By: Waynard Reeds, M.D.    Assessment/Plan: Right hydronephrosis s/p Rt PCN placement by IR 7/25.  Creatinine improving.  Follow cultures - treat appropriately pending final results.  Will plan for internalization with nephrouereteral stent tomorrow with mild-moderate sedation. Discussed procedure with pt. Consent signed in chart.     LOS: 4 days    Brayton El PA-C 06/25/2012 10:12 AM

## 2012-06-25 NOTE — Progress Notes (Signed)
[facility] PROGRESS NOTE  JIMI SCHAPPERT MWU:[id number] DOB: 12-19-1929 DOA: 06/21/2012 PCP: Kimber Relic, MD  Assessment/Plan: Principal Problem:  *Pyelonephritis Active Problems:  Prostate cancer  COPD (chronic obstructive pulmonary disease)  Hydronephrosis, right  AAA (abdominal aortic aneurysm)  Ileus  1. Worsening hydronephrosis- s/p placement of a R nephrostomy tube with a possible double J stent placement later- culture of urine sent-e coli abx: cipro  2. Ileus/SBO- NG tube placed for vomiting- surgery consulted,  greenish bile out 3. Pyelonephritis- culture urine- ecoli 4. COPD- stable 5. AAA- follow up with vascular surgery as an outpatient 6. Prostate Ca  Code Status: full Family Communication: none Disposition Plan: home when better    Consultants:  urology  HPI/Subjective: Feeling better today No fever No chills No CP  Objective: Filed Vitals:   06/24/12 1421 06/24/12 2124 06/25/12 0455 06/25/12 1017  BP: 149/80 127/72 135/80 141/77  Pulse: 77 77 83 83  Temp: 98.9 F (37.2 C) 98.3 F (36.8 C) 99.5 F (37.5 C) 99.4 F (37.4 C)  TempSrc: Oral Oral Oral Oral  Resp: 18 18 18 18   Height:      Weight:      SpO2: 93% 90% 91% 93%    Intake/Output Summary (Last 24 hours) at 06/25/12 1306 Last data filed at 06/25/12 0456  Gross per 24 hour  Intake     10 ml  Output    900 ml  Net   -890 ml    Exam:  GEN: resting, NG tube in place  CHEST: Normal respiration, clear to auscultation bilaterally.  HEART: Regular rate and rhythm. There are no murmur, rub, or gallops.  ABDOMEN: slightly distended, decreased BS. He has a colostomy on the left and a urostomy on the right  EXTREMITIES: No bone or joint deformity; age-appropriate arthropathy of the hands and knees; no edema; no ulcerations. There is no calf tenderness.  SKIN: Normal hydration no rash or ulceration    Data Reviewed: Basic Metabolic Panel:  Lab 06/24/12 7253 06/22/12 0349  06/21/12 1630  NA 136 136 137  K 4.2 4.3 4.6  CL 99 101 99  CO2 28 25 21   GLUCOSE 130* 116* 130*  BUN 23 29* 30*  CREATININE 1.42* 1.67* 1.48*  CALCIUM 9.1 8.9 9.9  MG -- -- --  PHOS -- -- --   Liver Function Tests:  Lab 06/21/12 1630  AST 20  ALT 16  ALKPHOS 51  BILITOT 0.6  PROT 8.0  ALBUMIN 4.4    Lab 06/21/12 1630  LIPASE 23  AMYLASE --   No results found for this basename: AMMONIA:5 in the last 168 hours CBC:  Lab 06/24/12 0420 06/22/12 0349 06/21/12 1630  WBC 7.3 9.2 9.3  NEUTROABS -- -- 7.5  HGB 13.2 14.1 16.0  HCT 39.0 41.2 45.7  MCV 90.5 89.6 89.3  PLT 156 161 176   Cardiac Enzymes: No results found for this basename: CKTOTAL:5,CKMB:5,CKMBINDEX:5,TROPONINI:5 in the last 168 hours BNP (last 3 results) No results found for this basename: PROBNP:3 in the last 8760 hours CBG: No results found for this basename: GLUCAP:5 in the last 168 hours  Recent Results (from the past 240 hour(s))  URINE CULTURE     Status: Normal   Collection Time   06/21/12  6:57 PM      Component Value Range Status Comment   Specimen Description URINE, CLEAN CATCH   Final    Special Requests NONE   Final    Culture  Setup  Time 06/22/2012 03:21   Final    Colony Count NO GROWTH   Final    Culture NO GROWTH   Final    Report Status 06/22/2012 FINAL   Final   BODY FLUID CULTURE     Status: Normal   Collection Time   06/22/12  3:35 PM      Component Value Range Status Comment   Specimen Description KIDNEY URINE   Final    Special Requests NONE   Final    Gram Stain     Final    Value: MODERATE WBC PRESENT,BOTH PMN AND MONONUCLEAR     NO ORGANISMS SEEN   Culture FEW ESCHERICHIA COLI   Final    Report Status 06/25/2012 FINAL   Final    Organism ID, Bacteria ESCHERICHIA COLI   Final   CULTURE, BLOOD (ROUTINE X 2)     Status: Normal (Preliminary result)   Collection Time   06/22/12  8:15 PM      Component Value Range Status Comment   Specimen Description BLOOD RIGHT ARM   Final     Special Requests BOTTLES DRAWN AEROBIC AND ANAEROBIC 10CC   Final    Culture  Setup Time 06/23/2012 01:34   Final    Culture     Final    Value:        BLOOD CULTURE RECEIVED NO GROWTH TO DATE CULTURE WILL BE HELD FOR 5 DAYS BEFORE ISSUING A FINAL NEGATIVE REPORT   Report Status PENDING   Incomplete   CULTURE, BLOOD (ROUTINE X 2)     Status: Normal (Preliminary result)   Collection Time   06/22/12  8:20 PM      Component Value Range Status Comment   Specimen Description BLOOD RIGHT ARM   Final    Special Requests BOTTLES DRAWN AEROBIC AND ANAEROBIC 10CC   Final    Culture  Setup Time 06/23/2012 01:35   Final    Culture     Final    Value:        BLOOD CULTURE RECEIVED NO GROWTH TO DATE CULTURE WILL BE HELD FOR 5 DAYS BEFORE ISSUING A FINAL NEGATIVE REPORT   Report Status PENDING   Incomplete      Studies: Ct Abdomen Pelvis Wo Contrast  06/21/2012  *RADIOLOGY REPORT*  Clinical Data: Right flank pain  CT ABDOMEN AND PELVIS WITHOUT CONTRAST  Technique:  Multidetector CT imaging of the abdomen and pelvis was performed following the standard protocol without intravenous contrast.  Comparison: 12/04/2009  Findings: Stable cholelithiasis.  Chronic changes at the lung bases.  There is mild left hydronephrosis.  Right hydronephrosis has worsened.  There is significant perinephric stranding about the right kidney.  The patient is status post cystectomy with right lower quadrant ileal conduit.  Coronary artery calcifications are noted at the lung bases.  Extensive stool throughout the length of the colon is noted.  Left lower quadrant colostomy is in place.  Peristomal hernia containing several loops of colon is noted.  Small hernia through the pelvic floor on the right containing a small segment of small bowel is not significantly changed.  No disproportionate dilatation of small bowel to suggest obstruction.  Infrarenal abdominal aortic aneurysm is present.  Maximal AP diameter is 5.0 cm.  Previously,  this was 4.4 cm.  Stable L1 compression deformity.  No definite evidence of acute appendicitis.  IMPRESSION: Compared the prior study, right hydronephrosis has worsened and right perinephric stranding has worsened.  No ureteral calculi  are seen.  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  Previously noted small bowel obstruction pattern has resolved.  Infrarenal abdominal aortic aneurysm has increased from 4.4 cm to 5.0 cm.  Exam is otherwise unchanged.  Original Report Authenticated By: Donavan Burnet, M.D.    Scheduled Meds:    . alum & mag hydroxide-simeth  30 mL Oral Daily  . antiseptic oral rinse  15 mL Mouth Rinse BID  . aspirin EC  81 mg Oral Daily  . buPROPion  150 mg Oral BID  . ciprofloxacin  400 mg Intravenous Q12H  . gabapentin  300 mg Oral TID  . guaiFENesin  600 mg Oral BID  . heparin  5,000 Units Subcutaneous Q8H  . metoCLOPramide (REGLAN) injection  5 mg Intravenous Q8H  . multivitamin with minerals  1 tablet Oral Daily  . omega-3 acid ethyl esters  1 g Oral BID  . simvastatin  20 mg Oral QHS  . zolpidem  5 mg Oral Once   Continuous Infusions:    . sodium chloride 1,000 mL (06/25/12 1002)    Principal Problem:  *Pyelonephritis Active Problems:  Prostate cancer  COPD (chronic obstructive pulmonary disease)  Hydronephrosis, right  AAA (abdominal aortic aneurysm)  Ileus    Time spent: 30    [facility] [place] [facility], [place]  [facility] Pager [phone] . 06/25/2012, 1:06 PM  LOS: 4 days

## 2012-06-25 NOTE — Progress Notes (Signed)
Patient ID: Michael Novak, male   DOB: 18-Aug-1930, 78 y.o.   MRN: [id number]   Subjective: Patient reports no real significant change or new complaints. He continues to have probable ileus. His situation has not worsened. Nephrostomy tube continues to drain well.  Objective: Vital signs in last 24 hours: Temp:  [98.3 F (36.8 C)-99.5 F (37.5 C)] 99.4 F (37.4 C) (07/28 1017) Pulse Rate:  [77-83] 83  (07/28 1017) Resp:  [18] 18  (07/28 1017) BP: (127-149)/(72-80) 141/77 mmHg (07/28 1017) SpO2:  [90 %-93 %] 93 % (07/28 1017)  Intake/Output from previous day: 07/27 0701 - 07/28 0700 In: 10  Out: 1650 [Urine:950; Emesis/NG output:700] Intake/Output this shift:    Physical Exam:  Constitutional: Vital signs reviewed. WD WN in NAD   Eyes: PERRL, No scleral icterus.   Cardiovascular: RRR Pulmonary/Chest: Normal effort Abdominal: Soft. Non-tender Genitourinary: Not examined Extremities: No cyanosis or edema   Lab Results:  Basename 06/24/12 0420  HGB 13.2  HCT 39.0   BMET  Basename 06/24/12 0420  NA 136  K 4.2  CL 99  CO2 28  GLUCOSE 130*  BUN 23  CREATININE 1.42*  CALCIUM 9.1   No results found for this basename: LABPT:3,INR:3 in the last 72 hours No results found for this basename: LABURIN:1 in the last 72 hours Results for orders placed during the hospital encounter of 06/21/12  URINE CULTURE     Status: Normal   Collection Time   06/21/12  6:57 PM      Component Value Range Status Comment   Specimen Description URINE, CLEAN CATCH   Final    Special Requests NONE   Final    Culture  Setup Time 06/22/2012 03:21   Final    Colony Count NO GROWTH   Final    Culture NO GROWTH   Final    Report Status 06/22/2012 FINAL   Final   BODY FLUID CULTURE     Status: Normal   Collection Time   06/22/12  3:35 PM      Component Value Range Status Comment   Specimen Description KIDNEY URINE   Final    Special Requests NONE   Final    Gram Stain     Final    Value:  MODERATE WBC PRESENT,BOTH PMN AND MONONUCLEAR     NO ORGANISMS SEEN   Culture FEW ESCHERICHIA COLI   Final    Report Status 06/25/2012 FINAL   Final    Organism ID, Bacteria ESCHERICHIA COLI   Final   CULTURE, BLOOD (ROUTINE X 2)     Status: Normal (Preliminary result)   Collection Time   06/22/12  8:15 PM      Component Value Range Status Comment   Specimen Description BLOOD RIGHT ARM   Final    Special Requests BOTTLES DRAWN AEROBIC AND ANAEROBIC 10CC   Final    Culture  Setup Time 06/23/2012 01:34   Final    Culture     Final    Value:        BLOOD CULTURE RECEIVED NO GROWTH TO DATE CULTURE WILL BE HELD FOR 5 DAYS BEFORE ISSUING A FINAL NEGATIVE REPORT   Report Status PENDING   Incomplete   CULTURE, BLOOD (ROUTINE X 2)     Status: Normal (Preliminary result)   Collection Time   06/22/12  8:20 PM      Component Value Range Status Comment   Specimen Description BLOOD RIGHT ARM   Final  Special Requests BOTTLES DRAWN AEROBIC AND ANAEROBIC 10CC   Final    Culture  Setup Time 06/23/2012 01:35   Final    Culture     Final    Value:        BLOOD CULTURE RECEIVED NO GROWTH TO DATE CULTURE WILL BE HELD FOR 5 DAYS BEFORE ISSUING A FINAL NEGATIVE REPORT   Report Status PENDING   Incomplete     Studies/Results: Dg Abd 1 View  06/24/2012  *RADIOLOGY REPORT*  Clinical Data: Follow up ileus  ABDOMEN - 1 VIEW  Comparison: 06/23/2012  Findings: Persistent gaseous distended small bowel loops mid abdomen consistent with ileus or partial small bowel obstruction. Stable nephrostomy catheter in the right abdomen NG tube in place with tip in proximal duodenum region.  Stool noted in the right colon and sigmoid colon.  IMPRESSION: Persistent gaseous distended small bowel loops mid abdomen consistent with ileus or partial small bowel obstruction. NG tube in place.  Original Report Authenticated By: Natasha Mead, M.D.   Dg Abd 1 View  06/23/2012  *RADIOLOGY REPORT*  Clinical Data: Evaluate for ileus  ABDOMEN -  1 VIEW  Comparison: Right-sided percutaneous nephrostomy tube placement - 06/22/2012; Abdominal CT - 06/21/2012  Findings:  There is marked dilatation of multiple loops of small bowel.  A small amount of air is seen within the cecum and ascending colon. Moderate colonic stool burden.  Evaluation for pneumoperitoneum is limited secondary to supine patient positioning and exclusion of the lower thorax. Recently placed percutaneous nephrostomy catheter overlies the right mid hemiabdomen.  Multiple surgical clips overlie the pelvis.  Lumbar spine degenerative change.  IMPRESSION:  Marked gaseous distension of predominately the small bowel, though distal colonic gas and stool is identified.  While these findings may suggest ileus, early/partial small bowel obstruction is not excluded.  Continued attention on follow-up is recommended. Note this examination is nondiagnostic for the evaluation of pneumoperitoneum.  If this is of clinical concern, either upright or left lateral decubitus abdominal radiographs are recommended.  This was made a call report.  Original Report Authenticated By: Waynard Reeds, M.D.    Assessment/Plan:   Not much clinical change. Renal function stable. Assuming no real worsening of the GI situation he will have an attempt at internalization of his nephrostomy tube tomorrow. Interventional radiology has evaluated the patient and he is on the schedule at this time.   LOS: 4 days   Kalecia Hartney S 06/25/2012, 11:07 AM

## 2012-06-26 ENCOUNTER — Inpatient Hospital Stay (HOSPITAL_COMMUNITY): Payer: Medicare Other

## 2012-06-26 DIAGNOSIS — C61 Malignant neoplasm of prostate: Secondary | ICD-10-CM

## 2012-06-26 LAB — CBC
HCT: 39.7 % (ref 39.0–52.0)
MCV: 90.2 fL (ref 78.0–100.0)
Platelets: 193 10*3/uL (ref 150–400)
RBC: 4.4 MIL/uL (ref 4.22–5.81)
WBC: 4.4 10*3/uL (ref 4.0–10.5)

## 2012-06-26 LAB — BASIC METABOLIC PANEL
CO2: 27 mEq/L (ref 19–32)
Chloride: 99 mEq/L (ref 96–112)
Creatinine, Ser: 1.24 mg/dL (ref 0.50–1.35)

## 2012-06-26 MED ORDER — CEPASTAT 14.5 MG MT LOZG
1.0000 | LOZENGE | OROMUCOSAL | Status: DC | PRN
  Filled 2012-06-26 (×2): qty 18

## 2012-06-26 MED ORDER — LIDOCAINE HCL 1 % IJ SOLN
INTRAMUSCULAR | Status: AC
  Filled 2012-06-26: qty 20

## 2012-06-26 MED ORDER — IOHEXOL 300 MG/ML  SOLN: 30.0000 mL | Freq: Once | INTRAMUSCULAR | Status: AC | PRN

## 2012-06-26 MED ORDER — PHENOL 1.4 % MT LIQD
1.0000 | OROMUCOSAL | Status: DC | PRN
  Administered 2012-06-26: 1 via OROMUCOSAL
  Filled 2012-06-26: qty 177

## 2012-06-26 MED ORDER — FENTANYL CITRATE 0.05 MG/ML IJ SOLN
INTRAMUSCULAR | Status: AC | PRN
  Administered 2012-06-26 (×2): 100 ug via INTRAVENOUS

## 2012-06-26 MED ORDER — MIDAZOLAM HCL 5 MG/5ML IJ SOLN
INTRAMUSCULAR | Status: AC | PRN
  Administered 2012-06-26: 2 mg via INTRAVENOUS

## 2012-06-26 MED ORDER — MIDAZOLAM HCL 2 MG/2ML IJ SOLN
INTRAMUSCULAR | Status: AC
  Filled 2012-06-26: qty 4

## 2012-06-26 MED ORDER — FENTANYL CITRATE 0.05 MG/ML IJ SOLN
INTRAMUSCULAR | Status: AC
  Filled 2012-06-26: qty 6

## 2012-06-26 NOTE — Progress Notes (Signed)
Subjective: Patient reports no new flank pain. He reports passing flatus but no bowel movement yet. He is no abnormal discomfort.  Objective: Vital signs in last 24 hours: Temp:  [98.3 F (36.8 C)-99.4 F (37.4 C)] 99.1 F (37.3 C) (07/29 0510) Pulse Rate:  [70-84] 74  (07/29 0510) Resp:  [18-20] 18  (07/29 0510) BP: (138-151)/(77-89) 148/81 mmHg (07/29 0510) SpO2:  [90 %-93 %] 90 % (07/29 0510)  Intake/Output from previous day: 07/28 0701 - 07/29 0700 In: -  Out: 1100 [Urine:700; Emesis/NG output:400] Intake/Output this shift:    Physical Exam:  His abdomen is soft. His nephrostomy tube is draining clear urine.  Lab Results:  Basename 06/26/12 0340 06/24/12 0420  HGB 13.6 13.2  HCT 39.7 39.0   BMET  Basename 06/26/12 0340 06/24/12 0420  NA 137 136  K 3.9 4.2  CL 99 99  CO2 27 28  GLUCOSE 99 130*  BUN 27* 23  CREATININE 1.24 1.42*  CALCIUM 9.4 9.1   No results found for this basename: LABPT:3,INR:3 in the last 72 hours No results found for this basename: LABURIN:1 in the last 72 hours Results for orders placed during the hospital encounter of 06/21/12  URINE CULTURE     Status: Normal   Collection Time   06/21/12  6:57 PM      Component Value Range Status Comment   Specimen Description URINE, CLEAN CATCH   Final    Special Requests NONE   Final    Culture  Setup Time 06/22/2012 03:21   Final    Colony Count NO GROWTH   Final    Culture NO GROWTH   Final    Report Status 06/22/2012 FINAL   Final   BODY FLUID CULTURE     Status: Normal   Collection Time   06/22/12  3:35 PM      Component Value Range Status Comment   Specimen Description KIDNEY URINE   Final    Special Requests NONE   Final    Gram Stain     Final    Value: MODERATE WBC PRESENT,BOTH PMN AND MONONUCLEAR     NO ORGANISMS SEEN   Culture FEW ESCHERICHIA COLI   Final    Report Status 06/25/2012 FINAL   Final    Organism ID, Bacteria ESCHERICHIA COLI   Final   CULTURE, BLOOD (ROUTINE X 2)      Status: Normal (Preliminary result)   Collection Time   06/22/12  8:15 PM      Component Value Range Status Comment   Specimen Description BLOOD RIGHT ARM   Final    Special Requests BOTTLES DRAWN AEROBIC AND ANAEROBIC 10CC   Final    Culture  Setup Time 06/23/2012 01:34   Final    Culture     Final    Value:        BLOOD CULTURE RECEIVED NO GROWTH TO DATE CULTURE WILL BE HELD FOR 5 DAYS BEFORE ISSUING A FINAL NEGATIVE REPORT   Report Status PENDING   Incomplete   CULTURE, BLOOD (ROUTINE X 2)     Status: Normal (Preliminary result)   Collection Time   06/22/12  8:20 PM      Component Value Range Status Comment   Specimen Description BLOOD RIGHT ARM   Final    Special Requests BOTTLES DRAWN AEROBIC AND ANAEROBIC 10CC   Final    Culture  Setup Time 06/23/2012 01:35   Final    Culture  Final    Value:        BLOOD CULTURE RECEIVED NO GROWTH TO DATE CULTURE WILL BE HELD FOR 5 DAYS BEFORE ISSUING A FINAL NEGATIVE REPORT   Report Status PENDING   Incomplete     Studies/Results: Dg Abd 1 View  06/25/2012  *RADIOLOGY REPORT*  Clinical Data: Ileus or small bowel obstruction  ABDOMEN - 1 VIEW  Comparison: 06/24/2012  Findings: Stable NG tube position.  Stable right nephrostomy catheter position.  Persistent gaseous distended small bowel loops within abdomen and pelvis consistent with ileus or partial small bowel obstruction.  IMPRESSION: Persistent gaseous distended small bowel loops consistent with ileus or partial small bowel obstruction.  Original Report Authenticated By: Natasha Mead, M.D.   Dg Abd Portable 2v  06/26/2012  *RADIOLOGY REPORT*  Clinical Data: Follow-up evaluation for abdominal pain.  PORTABLE ABDOMEN - 2 VIEW  Comparison: Abdominal radiographs 06/25/2012.  Findings: There is a pigtail drainage catheter in place projecting over the right mid abdomen.  The nasogastric tube is seen with tip at the end of the proximal duodenum or the antral prepyloric region of the stomach.  Some gas  and stool is seen in the colon to the level of the distal transverse colon (likely at the entrance site into the patient's large left-sided parastomal hernia).  Multiple dilated loops of small bowel are seen measuring up to 4.0 cm in diameter.  There is a tiny amount of gas projecting over the lower pelvis, however, it is uncertain whether not this represents rectal gas or (more likely) gas within some distal small bowel loops that have fallen into the lower anatomic pelvis (based on comparison with recent CT scan 06/21/2012 this is favored).  No gross evidence of pneumoperitoneum.  IMPRESSION: 1. Overall, the degree of gaseous distension has significantly improved compared to yesterday's examination.  While there continues to be some mild dilatation of small bowel, the overall appearance is one of a resolving ileus or partial small bowel obstruction. 2.  Support apparatus, as above.  Original Report Authenticated By: Florencia Reasons, M.D.    Assessment/Plan: His nephrostomy tube is draining with a good output and clear urine. He is not having any fever and his culture from the right kidney was positive for Escherichia coli that was pan sensitive.  He will undergo internalization of his right nephrostomy tube with a double-J stent by interventional radiology today.  He will then likely be ready for discharge from a urologic standpoint once his ileus resolves.  I will get Dr. Logan Bores phone number for him, since he said he lost it, so he can schedule an appointment with him for followup.   LOS: 5 days   Michael Novak C 06/26/2012, 8:47 AM

## 2012-06-26 NOTE — Progress Notes (Signed)
Seen, agree with above.    Probable d/c ngt tomorrow AM.  Still has some distention, but has some flatus in ostomy bag.

## 2012-06-26 NOTE — Progress Notes (Signed)
Subjective: Says he's no better than yesterday.  Some gas in ostomy, but no stool.  Objective: Vital signs in last 24 hours: Temp:  [98.3 F (36.8 C)-99.1 F (37.3 C)] 99.1 F (37.3 C) (07/29 0510) Pulse Rate:  [70-84] 74  (07/29 0510) Resp:  [18-20] 18  (07/29 0510) BP: (138-151)/(79-89) 148/81 mmHg (07/29 0510) SpO2:  [90 %-93 %] 90 % (07/29 0510) Last BM Date: 06/21/12  Nothing recorded for PO or IV, 700 ml urine, 400/NG, Tm 99.5, labs OK, 2 view shows improving distension,   Intake/Output from previous day: 07/28 0701 - 07/29 0700 In: -  Out: 1100 [Urine:700; Emesis/NG output:400] Intake/Output this shift:    GI: soft, slightly distended, Ostomy and urostomy present, bowel sounds hypoactive.  Lab Results:   Basename 06/26/12 0340 06/24/12 0420  WBC 4.4 7.3  HGB 13.6 13.2  HCT 39.7 39.0  PLT 193 156    BMET  Basename 06/26/12 0340 06/24/12 0420  NA 137 136  K 3.9 4.2  CL 99 99  CO2 27 28  GLUCOSE 99 130*  BUN 27* 23  CREATININE 1.24 1.42*  CALCIUM 9.4 9.1   PT/INR No results found for this basename: LABPROT:2,INR:2 in the last 72 hours   Lab 06/21/12 1630  AST 20  ALT 16  ALKPHOS 51  BILITOT 0.6  PROT 8.0  ALBUMIN 4.4     Lipase     Component Value Date/Time   LIPASE 23 06/21/2012 1630     Studies/Results: Dg Abd 1 View  06/25/2012  *RADIOLOGY REPORT*  Clinical Data: Ileus or small bowel obstruction  ABDOMEN - 1 VIEW  Comparison: 06/24/2012  Findings: Stable NG tube position.  Stable right nephrostomy catheter position.  Persistent gaseous distended small bowel loops within abdomen and pelvis consistent with ileus or partial small bowel obstruction.  IMPRESSION: Persistent gaseous distended small bowel loops consistent with ileus or partial small bowel obstruction.  Original Report Authenticated By: Natasha Mead, M.D.   Dg Abd Portable 2v  06/26/2012  *RADIOLOGY REPORT*  Clinical Data: Follow-up evaluation for abdominal pain.  PORTABLE ABDOMEN  - 2 VIEW  Comparison: Abdominal radiographs 06/25/2012.  Findings: There is a pigtail drainage catheter in place projecting over the right mid abdomen.  The nasogastric tube is seen with tip at the end of the proximal duodenum or the antral prepyloric region of the stomach.  Some gas and stool is seen in the colon to the level of the distal transverse colon (likely at the entrance site into the patient's large left-sided parastomal hernia).  Multiple dilated loops of small bowel are seen measuring up to 4.0 cm in diameter.  There is a tiny amount of gas projecting over the lower pelvis, however, it is uncertain whether not this represents rectal gas or (more likely) gas within some distal small bowel loops that have fallen into the lower anatomic pelvis (based on comparison with recent CT scan 06/21/2012 this is favored).  No gross evidence of pneumoperitoneum.  IMPRESSION: 1. Overall, the degree of gaseous distension has significantly improved compared to yesterday's examination.  While there continues to be some mild dilatation of small bowel, the overall appearance is one of a resolving ileus or partial small bowel obstruction. 2.  Support apparatus, as above.  Original Report Authenticated By: Florencia Reasons, M.D.    Medications:    . alum & mag hydroxide-simeth  30 mL Oral Daily  . antiseptic oral rinse  15 mL Mouth Rinse BID  . aspirin EC  81 mg Oral Daily  . buPROPion  150 mg Oral BID  . ciprofloxacin  400 mg Intravenous Q12H  . gabapentin  300 mg Oral TID  . guaiFENesin  600 mg Oral BID  . heparin  5,000 Units Subcutaneous Q8H  . metoCLOPramide (REGLAN) injection  5 mg Intravenous Q8H  . multivitamin with minerals  1 tablet Oral Daily  . omega-3 acid ethyl esters  1 g Oral BID  . simvastatin  20 mg Oral QHS    Assessment/Plan Ileus vs SBO Worsening hydronephrosis- s/p placement of a R nephrostomy tube with a double J stent placement later today, culture of urine sent-e coli abx:  cipro Pyelonephritis- culture urine- ecoli COPD-   AAA- follow up with vascular surgery as an outpatient Prostate Ca  Plan:  The filter on his NG is wet, and I pushed air thru the sump.  Drainage is pretty clear, and not much recorded or in cannister.  I would continue NG decompression for now and see how he does. 3 doses of morphine yesterday and one today so far.       LOS: 5 days    Tya Haughey 06/26/2012

## 2012-06-26 NOTE — Progress Notes (Signed)
[facility] PROGRESS NOTE  Michael Novak JXB:[id number] DOB: Jun 13, 1930 DOA: 06/21/2012 PCP: Kimber Relic, MD  Assessment/Plan: Principal Problem:  *Pyelonephritis Active Problems:  Prostate cancer  COPD (chronic obstructive pulmonary disease)  Hydronephrosis, right  AAA (abdominal aortic aneurysm)  Ileus  1. Worsening hydronephrosis- s/p placement of a R nephrostomy tube with a  double J stent placement later today,  culture of urine sent-e coli abx: cipro  2. Ileus/SBO- NG tube placed for vomiting- surgery consulted,  greenish bile out- lessened but still with output 3. Pyelonephritis- culture urine- ecoli 4. COPD- stable 5. AAA- follow up with vascular surgery as an outpatient 6. Prostate Ca  Will need PT for mobilization   Code Status: full Family Communication: son via phone Disposition Plan: home when better    Consultants:  Urology  surgery  HPI/Subjective: Feeling better No CP  Objective: Filed Vitals:   06/25/12 1417 06/25/12 1744 06/25/12 2058 06/26/12 0510  BP: 151/89 138/86 139/79 148/81  Pulse: 82 84 70 74  Temp: 98.7 F (37.1 C) 98.9 F (37.2 C) 98.3 F (36.8 C) 99.1 F (37.3 C)  TempSrc: Oral Oral Oral Oral  Resp: 18 18 20 18   Height:      Weight:      SpO2: 93% 92% 91% 90%    Intake/Output Summary (Last 24 hours) at 06/26/12 0802 Last data filed at 06/25/12 2058  Gross per 24 hour  Intake      0 ml  Output   1100 ml  Net  -1100 ml    Exam:  GEN: resting, NG tube in place  CHEST: Normal respiration, clear to auscultation bilaterally.  HEART: Regular rate and rhythm. There are no murmur, rub, or gallops.  ABDOMEN: slightly distended, decreased BS. He has a colostomy on the left and a urostomy on the right  EXTREMITIES: No bone or joint deformity; age-appropriate arthropathy of the hands and knees; no edema; no ulcerations. There is no calf tenderness.  SKIN: Normal hydration no rash or ulceration    Data  Reviewed: Basic Metabolic Panel:  Lab 06/26/12 1308 06/24/12 0420 06/22/12 0349 06/21/12 1630  NA 137 136 136 137  K 3.9 4.2 4.3 4.6  CL 99 99 101 99  CO2 27 28 25 21   GLUCOSE 99 130* 116* 130*  BUN 27* 23 29* 30*  CREATININE 1.24 1.42* 1.67* 1.48*  CALCIUM 9.4 9.1 8.9 9.9  MG -- -- -- --  PHOS -- -- -- --   Liver Function Tests:  Lab 06/21/12 1630  AST 20  ALT 16  ALKPHOS 51  BILITOT 0.6  PROT 8.0  ALBUMIN 4.4    Lab 06/21/12 1630  LIPASE 23  AMYLASE --   No results found for this basename: AMMONIA:5 in the last 168 hours CBC:  Lab 06/26/12 0340 06/24/12 0420 06/22/12 0349 06/21/12 1630  WBC 4.4 7.3 9.2 9.3  NEUTROABS -- -- -- 7.5  HGB 13.6 13.2 14.1 16.0  HCT 39.7 39.0 41.2 45.7  MCV 90.2 90.5 89.6 89.3  PLT 193 156 161 176   Cardiac Enzymes: No results found for this basename: CKTOTAL:5,CKMB:5,CKMBINDEX:5,TROPONINI:5 in the last 168 hours BNP (last 3 results) No results found for this basename: PROBNP:3 in the last 8760 hours CBG: No results found for this basename: GLUCAP:5 in the last 168 hours  Recent Results (from the past 240 hour(s))  URINE CULTURE     Status: Normal   Collection Time   06/21/12  6:57 PM  Component Value Range Status Comment   Specimen Description URINE, CLEAN CATCH   Final    Special Requests NONE   Final    Culture  Setup Time 06/22/2012 03:21   Final    Colony Count NO GROWTH   Final    Culture NO GROWTH   Final    Report Status 06/22/2012 FINAL   Final   BODY FLUID CULTURE     Status: Normal   Collection Time   06/22/12  3:35 PM      Component Value Range Status Comment   Specimen Description KIDNEY URINE   Final    Special Requests NONE   Final    Gram Stain     Final    Value: MODERATE WBC PRESENT,BOTH PMN AND MONONUCLEAR     NO ORGANISMS SEEN   Culture FEW ESCHERICHIA COLI   Final    Report Status 06/25/2012 FINAL   Final    Organism ID, Bacteria ESCHERICHIA COLI   Final   CULTURE, BLOOD (ROUTINE X 2)      Status: Normal (Preliminary result)   Collection Time   06/22/12  8:15 PM      Component Value Range Status Comment   Specimen Description BLOOD RIGHT ARM   Final    Special Requests BOTTLES DRAWN AEROBIC AND ANAEROBIC 10CC   Final    Culture  Setup Time 06/23/2012 01:34   Final    Culture     Final    Value:        BLOOD CULTURE RECEIVED NO GROWTH TO DATE CULTURE WILL BE HELD FOR 5 DAYS BEFORE ISSUING A FINAL NEGATIVE REPORT   Report Status PENDING   Incomplete   CULTURE, BLOOD (ROUTINE X 2)     Status: Normal (Preliminary result)   Collection Time   06/22/12  8:20 PM      Component Value Range Status Comment   Specimen Description BLOOD RIGHT ARM   Final    Special Requests BOTTLES DRAWN AEROBIC AND ANAEROBIC 10CC   Final    Culture  Setup Time 06/23/2012 01:35   Final    Culture     Final    Value:        BLOOD CULTURE RECEIVED NO GROWTH TO DATE CULTURE WILL BE HELD FOR 5 DAYS BEFORE ISSUING A FINAL NEGATIVE REPORT   Report Status PENDING   Incomplete      Studies: Ct Abdomen Pelvis Wo Contrast  06/21/2012  *RADIOLOGY REPORT*  Clinical Data: Right flank pain  CT ABDOMEN AND PELVIS WITHOUT CONTRAST  Technique:  Multidetector CT imaging of the abdomen and pelvis was performed following the standard protocol without intravenous contrast.  Comparison: 12/04/2009  Findings: Stable cholelithiasis.  Chronic changes at the lung bases.  There is mild left hydronephrosis.  Right hydronephrosis has worsened.  There is significant perinephric stranding about the right kidney.  The patient is status post cystectomy with right lower quadrant ileal conduit.  Coronary artery calcifications are noted at the lung bases.  Extensive stool throughout the length of the colon is noted.  Left lower quadrant colostomy is in place.  Peristomal hernia containing several loops of colon is noted.  Small hernia through the pelvic floor on the right containing a small segment of small bowel is not significantly changed.   No disproportionate dilatation of small bowel to suggest obstruction.  Infrarenal abdominal aortic aneurysm is present.  Maximal AP diameter is 5.0 cm.  Previously, this was 4.4 cm.  Stable  L1 compression deformity.  No definite evidence of acute appendicitis.  IMPRESSION: Compared the prior study, right hydronephrosis has worsened and right perinephric stranding has worsened.  No ureteral calculi are seen.  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  Previously noted small bowel obstruction pattern has resolved.  Infrarenal abdominal aortic aneurysm has increased from 4.4 cm to 5.0 cm.  Exam is otherwise unchanged.  Original Report Authenticated By: Donavan Burnet, M.D.    Scheduled Meds:    . alum & mag hydroxide-simeth  30 mL Oral Daily  . antiseptic oral rinse  15 mL Mouth Rinse BID  . aspirin EC  81 mg Oral Daily  . buPROPion  150 mg Oral BID  . ciprofloxacin  400 mg Intravenous Q12H  . gabapentin  300 mg Oral TID  . guaiFENesin  600 mg Oral BID  . heparin  5,000 Units Subcutaneous Q8H  . metoCLOPramide (REGLAN) injection  5 mg Intravenous Q8H  . multivitamin with minerals  1 tablet Oral Daily  . omega-3 acid ethyl esters  1 g Oral BID  . simvastatin  20 mg Oral QHS   Continuous Infusions:    . sodium chloride 1,000 mL (06/25/12 1002)    Principal Problem:  *Pyelonephritis Active Problems:  Prostate cancer  COPD (chronic obstructive pulmonary disease)  Hydronephrosis, right  AAA (abdominal aortic aneurysm)  Ileus    Time spent: 30    [facility], Paulmichael Schreck  [facility] Pager [phone] . 06/26/2012, 8:02 AM  LOS: 5 days

## 2012-06-26 NOTE — Procedures (Signed)
Interventional Radiology Procedure Note  Procedure: Right antegrade nephroureterogram reveals a complete distal ureteral obstruction in the region of the ileal anastomosis.  Attempts at crossing the obstruction were unsuccessful.  12F nephrostomy tube exchanged for a new tube. Complications: No immediate Recommendations:  - Maintain to gravity drainage - Will require routine check/change every 6-8 weeks if not surgically corrected  Signed,  Sterling Big, MD Vascular & Interventional Radiologist [facility]

## 2012-06-26 NOTE — Evaluation (Signed)
Physical Therapy Evaluation Patient Details Name: Michael Novak MRN: [id number] DOB: Jul 25, 1930 Today's Date: 06/26/2012 Time: 5621-3086 PT Time Calculation (min): 10 min  PT Assessment / Plan / Recommendation Clinical Impression  78 yo male admitted with hydronephrosis. Percutaneous nephrostomy on 06/22/12, NG tube 7/27. Scheduled for stent placement on 06/26/12. Mobility limited by fatigue, general weakness. Pt will benefit from PT to improve strength, activity tolerance, and overall functional mobility and safety in preparation for d/c.     PT Assessment  Patient needs continued PT services    Follow Up Recommendations  Home health PT;Supervision/Assistance - 24 hour    Barriers to Discharge        Equipment Recommendations  None recommended by PT    Recommendations for Other Services OT consult   Frequency Min 3X/week    Precautions / Restrictions Precautions Precautions: Fall Precaution Comments: Multiple drains, lines. NG tube Restrictions Weight Bearing Restrictions: No   Pertinent Vitals/Pain       Mobility  Bed Mobility Bed Mobility: Not assessed Details for Bed Mobility Assistance: Pt sitting up in recliner.  Transfers Transfers: Stand to Sit;Sit to Stand Sit to Stand: 3: Mod assist;With upper extremity assist;From chair/3-in-1 Stand to Sit: 3: Mod assist;With upper extremity assist;With armrests;To chair/3-in-1 Details for Transfer Assistance: VCs safety, technique, hand placement. Assist to rise, stabilize, control descent.  Ambulation/Gait Ambulation/Gait Assistance: 1: +2 Total assist Ambulation/Gait: Patient Percentage: 70% Ambulation Distance (Feet): 3 Feet (steps forwards then backwards) Assistive device: 1 person hand held assist Ambulation/Gait Assistance Details: VCs safety. Pt unsteady. 1 person handheld assist and use of IV pole for support. Unable to ambulate further due to pt fatigue, NG tube still connected, and pt in process of preparing to  leave for procedure with IR.  Gait Pattern: Step-through pattern;Decreased stride length;Decreased step length - right;Decreased step length - left    Exercises     PT Diagnosis: Difficulty walking;Generalized weakness  PT Problem List: Decreased strength;Decreased activity tolerance;Decreased mobility;Decreased knowledge of use of DME PT Treatment Interventions: DME instruction;Gait training;Functional mobility training;Therapeutic activities;Therapeutic exercise;Patient/family education   PT Goals Acute Rehab PT Goals PT Goal Formulation: With patient Time For Goal Achievement: 07/10/12 Potential to Achieve Goals: Good Pt will go Supine/Side to Sit: with supervision PT Goal: Supine/Side to Sit - Progress: Goal set today Pt will go Sit to Supine/Side: with supervision PT Goal: Sit to Supine/Side - Progress: Goal set today Pt will go Sit to Stand: with supervision PT Goal: Sit to Stand - Progress: Goal set today Pt will Ambulate: 51 - 150 feet;with supervision;with least restrictive assistive device PT Goal: Ambulate - Progress: Goal set today  Visit Information  Last PT Received On: 06/26/12 Assistance Needed: +2 (safety/equipment)    Subjective Data  Subjective: "I really don't feel like getting up from this chair. I'm doing this for your benefit" Patient Stated Goal: get better   Prior Functioning  Home Living Lives With: Spouse Available Help at Discharge: Family Type of Home: House Home Access: Level entry Home Layout: One level Home Adaptive Equipment: Walker - rolling Prior Function Level of Independence: Independent Communication Communication: No difficulties    Cognition  Overall Cognitive Status: Appears within functional limits for tasks assessed/performed Arousal/Alertness: Awake/alert Orientation Level: Appears intact for tasks assessed Behavior During Session: [facility] for tasks performed    Extremity/Trunk Assessment Right Lower Extremity Assessment RLE  ROM/Strength/Tone: Deficits RLE ROM/Strength/Tone Deficits: Strength at least 4/5 with functional mobility Left Lower Extremity Assessment LLE ROM/Strength/Tone: Deficits LLE ROM/Strength/Tone Deficits:  Strength at least 4/5 with functional mobility   Balance    End of Session PT - End of Session Activity Tolerance: Patient limited by fatigue Patient left: in chair;with call bell/phone within reach;with nursing in room  GP     Rebeca Alert [facility] 06/26/2012, 2:18 PM [phone]

## 2012-06-27 NOTE — Progress Notes (Signed)
Subjective: Pt resting comfortably; no acute c/o  Objective: Vital signs in last 24 hours: Temp:  [99 F (37.2 C)-99.7 F (37.6 C)] 99.1 F (37.3 C) (07/30 1331) Pulse Rate:  [81-90] 90  (07/30 1331) Resp:  [18-20] 20  (07/30 1331) BP: (152-153)/(75-84) 153/81 mmHg (07/30 1331) SpO2:  [90 %-93 %] 93 % (07/30 1355) Last BM Date: 06/21/12  Intake/Output from previous day: 07/29 0701 - 07/30 0700 In: 600 [I.V.:600] Out: 2550 [Urine:1550; Emesis/NG output:1000] Intake/Output this shift: Total I/O In: 805 [I.V.:805] Out: 800 [Urine:800]  Right PCN intact, dressing dry, site NT, output about 600 cc's today sl turbid, yellow urine  Lab Results:   [facility] 06/26/12 0340  WBC 4.4  HGB 13.6  HCT 39.7  PLT 193   BMET  Basename 06/26/12 0340  NA 137  K 3.9  CL 99  CO2 27  GLUCOSE 99  BUN 27*  CREATININE 1.24  CALCIUM 9.4   PT/INR No results found for this basename: LABPROT:2,INR:2 in the last 72 hours ABG No results found for this basename: PHART:2,PCO2:2,PO2:2,HCO3:2 in the last 72 hours  Studies/Results: Ir Nephrostomy Tube Change  06/26/2012  *RADIOLOGY REPORT*  IR NEPHROSTOGRAM RIGHT, PRIOR NEPHROSTOMY TUBE CHANGE  Date: 06/26/2012  Clinical History: 76 male with a history of prostate cancer status post radicle cystectomy, 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  Chronic ureteral stenosis with right-sided hydronephrosis and progressive flank pain.  He had a percutaneous nephrostomy tube placed 06/22/2012.  He presents now for attempted conversion of percutaneous nephrostomy tube to internalized double- J ureteral stent.  Procedures Performed: 1. Antegrade right nephroureterogram 2.  Attempts at crossing distal ureteral obstruction 3.  Replacement of a new 10-French percutaneous nephrostomy tube  Interventional Radiologist:  Sterling Big, MD  Sedation: Moderate (conscious) sedation was used.  2 mg Versed, 200 mcg Fentanyl were  administered intravenously.  The patient's vital signs were monitored continuously by radiology nursing throughout the procedure.  Sedation Time: 30 minutes  Fluoroscopy time: 12.7  Contrast volume: 50 ml Omnipaque administered into the collecting system.  PROCEDURE/FINDINGS:   Informed consent was obtained from the patient following explanation of the procedure, risks, benefits and alternatives. The patient understands, agrees and consents for the procedure. All questions were addressed. A time out was performed.  Maximal barrier sterile technique utilized including caps, mask, sterile gowns, sterile gloves, large sterile drape, hand hygiene, and chlorhexidine skin prep.  An antegrade nephroureterogram was performed by hand injection of contrast material the existing right 10-French percutaneous nephrostomy tube.  No contrast material passed the distal ureter. The existing nephrostomy tube was transected, and a wire advanced into the ureter.  The nephrostomy tube was then removed over the wire and an angled Kumpe catheter advanced over the wire into the distal ureter.  Repeat contrast injection through the catheter into the distal ureter showed a blind ending, completely occluded distal ureter.  There is no contrast material extending into the ileostomy, despite forceful injection.  Multiple attempts were made at passing a hydrophilic wire across the ureteral obstruction. These were ultimately unsuccessful.  The findings of the case were discussed with Dr. Vernie Ammons by Dr. Archer Asa.  The decision was jointly made to replace the percutaneous nephrostomy tube into the lower pole moiety.  A 10- French percutaneous nephrostomy tube was then placed over the wire and the locking Cope loop formed in the lower pole renal pelvis. The tube was secured to the skin with Prolene suture.  The patient tolerated the  procedure well, there was no immediate complication. He was returned to the floor in stable condition.  IMPRESSION:   1.  Antegrade nephrostogram demonstrates complete occlusion of the distal right ureter.  2.  Attempts at crossing the distal ureteral obstruction were unsuccessful.  3.  Successful exchange of 10-French percutaneous nephrostomy tube. This tube will be left to gravity drainage.  If surgical revision is not an option, the patient will require routine tube check and changes every 6 - 8 weeks.  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  Chronic ureteral stenosis with right-sided hydronephrosis and progressive flank pain.  He had a percutaneous nephrostomy tube placed 06/22/2012.  He presents now for attempted conversion of percutaneous nephrostomy tube to internalized double- J ureteral stent.  Procedures Performed: 1. Antegrade right nephroureterogram 2.  Attempts at crossing distal ureteral obstruction 3.  Replacement of a new 10-French percutaneous nephrostomy tube  Interventional Radiologist:  Sterling Big, MD  Sedation: Moderate (conscious) sedation was used.  2 mg Versed, 200 mcg Fentanyl were administered intravenously.  The patient's vital signs were monitored continuously by radiology nursing throughout the procedure.  Sedation Time: 30 minutes  Fluoroscopy time: 12.7  Contrast volume: 50 ml Omnipaque administered into the collecting system.  PROCEDURE/FINDINGS:   Informed consent was obtained from the patient following explanation of the procedure, risks, benefits and alternatives. The patient understands, agrees and consents for the procedure. All questions were addressed. A time out was performed.  Maximal barrier sterile  technique utilized including caps, mask, sterile gowns, sterile gloves, large sterile drape, hand hygiene, and chlorhexidine skin prep.  An antegrade nephroureterogram was performed by hand injection of contrast material the existing right 10-French percutaneous nephrostomy tube.  No contrast material passed the distal ureter. The existing nephrostomy tube was transected, and a wire advanced into the ureter.  The nephrostomy tube was then removed over the wire and an angled Kumpe catheter advanced over the wire into the distal ureter.  Repeat contrast injection through the catheter into the distal ureter showed a blind ending, completely occluded distal ureter.  There is no contrast material extending into the ileostomy, despite forceful injection.  Multiple attempts were made at passing a hydrophilic wire across the ureteral obstruction. These were ultimately unsuccessful.  The findings of the case were discussed with Dr. Vernie Ammons by Dr. Archer Asa.  The decision was jointly made to replace the percutaneous nephrostomy tube into the lower pole moiety.  A 10- French percutaneous nephrostomy tube was then placed over the wire and the locking Cope loop formed in the lower pole renal pelvis. The tube was secured to the skin with Prolene suture.  The patient tolerated the procedure well, there was no immediate complication. He was returned to the floor in stable condition.  IMPRESSION:  1.  Antegrade nephrostogram demonstrates complete occlusion of the distal right ureter.  2.  Attempts at crossing the distal ureteral obstruction were unsuccessful.  3.  Successful exchange of 10-French percutaneous nephrostomy tube. This tube will be left to gravity drainage.  If surgical revision is not an option, the patient will require routine tube check and changes every 6 - 8 weeks.  Signed,  Vilma Prader  K. Archer Asa, MD Vascular & Interventional Radiologist [facility] Radiology  Original Report Authenticated By: Cay Schillings  Portable 2v  06/26/2012  *RADIOLOGY REPORT*  Clinical Data: Follow-up evaluation for abdominal pain.  PORTABLE ABDOMEN - 2 VIEW  Comparison: Abdominal radiographs 06/25/2012.  Findings: There is a pigtail drainage catheter in place projecting over the right mid abdomen.  The nasogastric tube is seen with tip at the end of the proximal duodenum or the antral prepyloric region of the stomach.  Some gas and stool is seen in the colon to the level of the distal transverse colon (likely at the entrance site into the patient's large left-sided parastomal hernia).  Multiple dilated loops of small bowel are seen measuring up to 4.0 cm in diameter.  There is a tiny amount of gas projecting over the lower pelvis, however, it is uncertain whether not this represents rectal gas or (more likely) gas within some distal small bowel loops that have fallen into the lower anatomic pelvis (based on comparison with recent CT scan 06/21/2012 this is favored).  No gross evidence of pneumoperitoneum.  IMPRESSION: 1. Overall, the degree of gaseous distension has significantly improved compared to yesterday's examination.  While there continues to be some mild dilatation of small bowel, the overall appearance is one of a resolving ileus or partial small bowel obstruction. 2.  Support apparatus, as above.  Original Report Authenticated By: Florencia Reasons, M.D.    Anti-infectives: Anti-infectives     Start     Dose/Rate Route Frequency Ordered Stop   06/22/12 0800   ciprofloxacin (CIPRO) IVPB 400 mg        400 mg 200 mL/hr over 60 Minutes Intravenous Every 12 hours 06/21/12 2159     06/21/12 1945   ciprofloxacin (CIPRO) IVPB 400 mg        400 mg 200 mL/hr over 60 Minutes Intravenous  Once 06/21/12 1936 06/21/12 2048          Assessment/Plan: S/p right PCN exchange 7/29 with unsuccessful attempt at ureteral stent placement secondary to complete occlusion of distal ureter; plans as per urology. If nephrostomy tube  remains will need to be exchanged every 6-8 weeks.   LOS: 6 days    Collyn Ribas,D [facility] 06/27/2012

## 2012-06-27 NOTE — Progress Notes (Signed)
Agree with above.  Clamping NGT.  Trial of sips of clears.  Some flatus in ostomy bag.

## 2012-06-27 NOTE — Progress Notes (Signed)
Subjective: Patient reports continued passage of flatus but no stool from his ostomy. He does report his abdomen is less distended and feels softer to him.  Objective: Vital signs in last 24 hours: Temp:  [98.7 F (37.1 C)-99.7 F (37.6 C)] 99 F (37.2 C) (07/30 0644) Pulse Rate:  [70-89] 81  (07/30 0644) Resp:  [12-30] 18  (07/30 0644) BP: (122-154)/(73-89) 153/84 mmHg (07/30 0644) SpO2:  [84 %-95 %] 92 % (07/30 0644)  Intake/Output from previous day: 07/29 0701 - 07/30 0700 In: 600 [I.V.:600] Out: 2550 [Urine:1550; Emesis/NG output:1000] Intake/Output this shift:    Physical Exam:  His abdomen is soft and nontender. There is gas in his ostomy pouch. His nephrostomy tube is draining clear urine.  Lab Results:  [facility] 06/26/12 0340  HGB 13.6  HCT 39.7   BMET  Basename 06/26/12 0340  NA 137  K 3.9  CL 99  CO2 27  GLUCOSE 99  BUN 27*  CREATININE 1.24  CALCIUM 9.4   No results found for this basename: LABPT:3,INR:3 in the last 72 hours No results found for this basename: LABURIN:1 in the last 72 hours Results for orders placed during the hospital encounter of 06/21/12  URINE CULTURE     Status: Normal   Collection Time   06/21/12  6:57 PM      Component Value Range Status Comment   Specimen Description URINE, CLEAN CATCH   Final    Special Requests NONE   Final    Culture  Setup Time 06/22/2012 03:21   Final    Colony Count NO GROWTH   Final    Culture NO GROWTH   Final    Report Status 06/22/2012 FINAL   Final   BODY FLUID CULTURE     Status: Normal   Collection Time   06/22/12  3:35 PM      Component Value Range Status Comment   Specimen Description KIDNEY URINE   Final    Special Requests NONE   Final    Gram Stain     Final    Value: MODERATE WBC PRESENT,BOTH PMN AND MONONUCLEAR     NO ORGANISMS SEEN   Culture FEW ESCHERICHIA COLI   Final    Report Status 06/25/2012 FINAL   Final    Organism ID, Bacteria ESCHERICHIA COLI   Final   CULTURE, BLOOD  (ROUTINE X 2)     Status: Normal (Preliminary result)   Collection Time   06/22/12  8:15 PM      Component Value Range Status Comment   Specimen Description BLOOD RIGHT ARM   Final    Special Requests BOTTLES DRAWN AEROBIC AND ANAEROBIC 10CC   Final    Culture  Setup Time 06/23/2012 01:34   Final    Culture     Final    Value:        BLOOD CULTURE RECEIVED NO GROWTH TO DATE CULTURE WILL BE HELD FOR 5 DAYS BEFORE ISSUING A FINAL NEGATIVE REPORT   Report Status PENDING   Incomplete   CULTURE, BLOOD (ROUTINE X 2)     Status: Normal (Preliminary result)   Collection Time   06/22/12  8:20 PM      Component Value Range Status Comment   Specimen Description BLOOD RIGHT ARM   Final    Special Requests BOTTLES DRAWN AEROBIC AND ANAEROBIC 10CC   Final    Culture  Setup Time 06/23/2012 01:35   Final    Culture  Final    Value:        BLOOD CULTURE RECEIVED NO GROWTH TO DATE CULTURE WILL BE HELD FOR 5 DAYS BEFORE ISSUING A FINAL NEGATIVE REPORT   Report Status PENDING   Incomplete     Studies/Results: Dg Abd 1 View  06/25/2012  *RADIOLOGY REPORT*  Clinical Data: Ileus or small bowel obstruction  ABDOMEN - 1 VIEW  Comparison: 06/24/2012  Findings: Stable NG tube position.  Stable right nephrostomy catheter position.  Persistent gaseous distended small bowel loops within abdomen and pelvis consistent with ileus or partial small bowel obstruction.  IMPRESSION: Persistent gaseous distended small bowel loops consistent with ileus or partial small bowel obstruction.  Original Report Authenticated By: Natasha Mead, M.D.   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  Chronic ureteral stenosis with right-sided hydronephrosis and progressive flank pain.  He had a  percutaneous nephrostomy tube placed 06/22/2012.  He presents now for attempted conversion of percutaneous nephrostomy tube to internalized double- J ureteral stent.  Procedures Performed: 1. Antegrade right nephroureterogram 2.  Attempts at crossing distal ureteral obstruction 3.  Replacement of a new 10-French percutaneous nephrostomy tube  Interventional Radiologist:  Sterling Big, MD  Sedation: Moderate (conscious) sedation was used.  2 mg Versed, 200 mcg Fentanyl were administered intravenously.  The patient's vital signs were monitored continuously by radiology nursing throughout the procedure.  Sedation Time: 30 minutes  Fluoroscopy time: 12.7  Contrast volume: 50 ml Omnipaque administered into the collecting system.  PROCEDURE/FINDINGS:   Informed consent was obtained from the patient following explanation of the procedure, risks, benefits and alternatives. The patient understands, agrees and consents for the procedure. All questions were addressed. A time out was performed.  Maximal barrier sterile technique utilized including caps, mask, sterile gowns, sterile gloves, large sterile drape, hand hygiene, and chlorhexidine skin prep.  An antegrade nephroureterogram was performed by hand injection of contrast material the existing right 10-French percutaneous nephrostomy tube.  No contrast material passed the distal ureter. The existing nephrostomy tube was transected, and a wire advanced into the ureter.  The nephrostomy tube was then removed over the wire and an angled Kumpe catheter advanced over the wire into the distal ureter.  Repeat contrast injection through the catheter into the distal ureter showed a blind ending, completely occluded distal ureter.  There is no contrast material extending into the ileostomy, despite forceful injection.  Multiple attempts were made at passing a hydrophilic wire across the ureteral obstruction. These were ultimately unsuccessful.  The findings of the case were  discussed with Dr. Vernie Ammons by Dr. Archer Asa.  The decision was jointly made to replace the percutaneous nephrostomy tube into the lower pole moiety.  A 10- French percutaneous nephrostomy tube was then placed over the wire and the locking Cope loop formed in the lower pole renal pelvis. The tube was secured to the skin with Prolene suture.  The patient tolerated the procedure well, there was no immediate complication. He was returned to the floor in stable condition.  IMPRESSION:  1.  Antegrade nephrostogram demonstrates complete occlusion of the distal right ureter.  2.  Attempts at crossing the distal ureteral obstruction were unsuccessful.  3.  Successful exchange of 10-French percutaneous nephrostomy tube. This tube will be left to gravity drainage.  If  surgical revision is not an option, the patient will require routine tube check and changes every 6 - 8 weeks.  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  Chronic ureteral stenosis with right-sided hydronephrosis and progressive flank pain.  He had a percutaneous nephrostomy tube placed 06/22/2012.  He presents now for attempted conversion of percutaneous nephrostomy tube to internalized double- J ureteral stent.  Procedures Performed: 1. Antegrade right nephroureterogram 2.  Attempts at crossing distal ureteral obstruction 3.  Replacement of a new 10-French percutaneous nephrostomy tube  Interventional Radiologist:  Sterling Big, MD  Sedation: Moderate (conscious) sedation was used.  2 mg Versed, 200 mcg Fentanyl were administered intravenously.  The patient's vital signs were  monitored continuously by radiology nursing throughout the procedure.  Sedation Time: 30 minutes  Fluoroscopy time: 12.7  Contrast volume: 50 ml Omnipaque administered into the collecting system.  PROCEDURE/FINDINGS:   Informed consent was obtained from the patient following explanation of the procedure, risks, benefits and alternatives. The patient understands, agrees and consents for the procedure. All questions were addressed. A time out was performed.  Maximal barrier sterile technique utilized including caps, mask, sterile gowns, sterile gloves, large sterile drape, hand hygiene, and chlorhexidine skin prep.  An antegrade nephroureterogram was performed by hand injection of contrast material the existing right 10-French percutaneous nephrostomy tube.  No contrast material passed the distal ureter. The existing nephrostomy tube was transected, and a wire advanced into the ureter.  The nephrostomy tube was then removed over the wire and an angled Kumpe catheter advanced over the wire into the distal ureter.  Repeat contrast injection through the catheter into the distal ureter showed a blind ending, completely occluded distal ureter.  There is no contrast material extending into the ileostomy, despite forceful injection.  Multiple attempts were made at passing a hydrophilic wire across the ureteral obstruction. These were ultimately unsuccessful.  The findings of the case were discussed with Dr. Vernie Ammons by Dr. Archer Asa.  The decision was jointly made to replace the percutaneous nephrostomy tube into the lower pole moiety.  A 10- French percutaneous nephrostomy tube was then placed over the wire and the locking Cope loop formed in the lower pole renal pelvis. The tube was secured to the skin with Prolene suture.  The patient tolerated the procedure well, there was no immediate complication. He was returned to the floor in stable condition.  IMPRESSION:  1.  Antegrade nephrostogram demonstrates complete occlusion  of the distal right ureter.  2.  Attempts at crossing the distal ureteral obstruction were unsuccessful.  3.  Successful exchange of 10-French percutaneous nephrostomy tube. This tube will be left to gravity drainage.  If surgical revision is not an option, the patient will require routine tube check and changes every 6 - 8 weeks.  Signed,  Sterling Big, MD Vascular & Interventional Radiologist [facility]  Original Report Authenticated By: Cay Schillings Portable 2v  06/26/2012  *RADIOLOGY REPORT*  Clinical Data: Follow-up evaluation for abdominal pain.  PORTABLE ABDOMEN - 2 VIEW  Comparison: Abdominal radiographs 06/25/2012.  Findings: There is a pigtail drainage catheter in place projecting over the right mid abdomen.  The  nasogastric tube is seen with tip at the end of the proximal duodenum or the antral prepyloric region of the stomach.  Some gas and stool is seen in the colon to the level of the distal transverse colon (likely at the entrance site into the patient's large left-sided parastomal hernia).  Multiple dilated loops of small bowel are seen measuring up to 4.0 cm in diameter.  There is a tiny amount of gas projecting over the lower pelvis, however, it is uncertain whether not this represents rectal gas or (more likely) gas within some distal small bowel loops that have fallen into the lower anatomic pelvis (based on comparison with recent CT scan 06/21/2012 this is favored).  No gross evidence of pneumoperitoneum.  IMPRESSION: 1. Overall, the degree of gaseous distension has significantly improved compared to yesterday's examination.  While there continues to be some mild dilatation of small bowel, the overall appearance is one of a resolving ileus or partial small bowel obstruction. 2.  Support apparatus, as above.  Original Report Authenticated By: Florencia Reasons, M.D.    Assessment/Plan: An attempt was made yesterday by the interventional radiologist to internalize his  nephrostomy tube with a double-J stent. I spoke to the interventional radiologist who told me that there appeared to be complete obstruction/obliteration of the distal portion of the ureter on the right side just before it enters the conduit. Because of that a double-J stent could not be placed and a nephrostomy tube was left indwelling. Once he is over his ileus and discharged he will need a followup with his urologist, Dr. Logan Bores, for consideration of revision of his right ureteral-ileal anastomosis.  1.He will be maintained on nephrostomy drainage upon discharge. 2. He will followup with Dr. Logan Bores. I have called and arranged a followup appointment for him with Dr. Logan Bores at his office here in [place] at 8:30 AM on 07/05/12.   LOS: 6 days   Jrue Yambao C 06/27/2012, 7:48 AM

## 2012-06-27 NOTE — Progress Notes (Signed)
Patient was sneezing and NG tube came out.

## 2012-06-27 NOTE — Progress Notes (Signed)
[facility] PROGRESS NOTE  Michael Novak ZOX:[id number] DOB: 29-Feb-1930 DOA: 06/21/2012 PCP: Kimber Relic, MD  78 yo M admitted with pyelonephritis/hyrdonephrosis.  He has a complicated colon and urologic history.    Assessment/Plan: Principal Problem:  *Pyelonephritis Active Problems:  Prostate cancer  COPD (chronic obstructive pulmonary disease)  Hydronephrosis, right  AAA (abdominal aortic aneurysm)  Ileus  Worsening hydronephrosis- s/p placement of a R nephrostomy tube. IR attempted a double J stent placement on 7/29:per IR there appeared to be complete obstruction/obliteration of the distal portion of the ureter on the right side just before it enters the conduit. Because of that a double-J stent could not be placed and a nephrostomy tube was left indwelling. Once he is over his ileus and discharged he will need a followup with his urologist, Dr. Logan Bores, for consideration of revision of his right ureteral-ileal anastomosis. culture of urine sent-e coli abx: cipro day #6  Ileus/SBO- NG tube placed for vomiting- surgery consulted,  Output through NG tube gone, patient passing gas- hope to get NG tube out today  Pyelonephritis- culture urine- ecoli  COPD- stable  AAA- follow up with vascular surgery as an outpatient  Prostate Ca  AKI- better once hydronephrosis relieved  Generalized weakness: PT- recs of 24 hour care at home (LM for son- patient lives with wife)   Code Status: full Family Communication: LM for son Disposition Plan: home when better    Consultants:  Urology  surgery  HPI/Subjective: Want NG tube out  Objective: Filed Vitals:   06/26/12 1512 06/26/12 1603 06/26/12 2213 06/27/12 0644  BP: 132/84 132/73 152/75 153/84  Pulse: 85 83 85 81  Temp:  98.9 F (37.2 C) 99.7 F (37.6 C) 99 F (37.2 C)  TempSrc:   Oral Oral  Resp: 22 20 18 18   Height:      Weight:      SpO2: 93% 91% 90% 92%    Intake/Output Summary (Last 24 hours) at  06/27/12 0801 Last data filed at 06/27/12 0645  Gross per 24 hour  Intake    600 ml  Output   2550 ml  Net  -1950 ml    Exam:  GEN: resting, NG tube in place  CHEST: Normal respiration, clear to auscultation bilaterally.  HEART: Regular rate and rhythm. There are no murmur, rub, or gallops.  ABDOMEN: slightly less distended, + BS around stomach but still decreased in lower quadrants. He has a colostomy on the left and a urostomy on the right  EXTREMITIES: No bone or joint deformity; age-appropriate arthropathy of the hands and knees; no edema; no ulcerations. There is no calf tenderness.  SKIN: Normal hydration no rash or ulceration    Data Reviewed: Basic Metabolic Panel:  Lab 06/26/12 8119 06/24/12 0420 06/22/12 0349 06/21/12 1630  NA 137 136 136 137  K 3.9 4.2 4.3 4.6  CL 99 99 101 99  CO2 27 28 25 21   GLUCOSE 99 130* 116* 130*  BUN 27* 23 29* 30*  CREATININE 1.24 1.42* 1.67* 1.48*  CALCIUM 9.4 9.1 8.9 9.9  MG -- -- -- --  PHOS -- -- -- --   Liver Function Tests:  Lab 06/21/12 1630  AST 20  ALT 16  ALKPHOS 51  BILITOT 0.6  PROT 8.0  ALBUMIN 4.4    Lab 06/21/12 1630  LIPASE 23  AMYLASE --   No results found for this basename: AMMONIA:5 in the last 168 hours CBC:  Lab 06/26/12 0340 06/24/12 0420 06/22/12  3295 06/21/12 1630  WBC 4.4 7.3 9.2 9.3  NEUTROABS -- -- -- 7.5  HGB 13.6 13.2 14.1 16.0  HCT 39.7 39.0 41.2 45.7  MCV 90.2 90.5 89.6 89.3  PLT 193 156 161 176   Cardiac Enzymes: No results found for this basename: CKTOTAL:5,CKMB:5,CKMBINDEX:5,TROPONINI:5 in the last 168 hours BNP (last 3 results) No results found for this basename: PROBNP:3 in the last 8760 hours CBG: No results found for this basename: GLUCAP:5 in the last 168 hours  Recent Results (from the past 240 hour(s))  URINE CULTURE     Status: Normal   Collection Time   06/21/12  6:57 PM      Component Value Range Status Comment   Specimen Description URINE, CLEAN CATCH   Final     Special Requests NONE   Final    Culture  Setup Time 06/22/2012 03:21   Final    Colony Count NO GROWTH   Final    Culture NO GROWTH   Final    Report Status 06/22/2012 FINAL   Final   BODY FLUID CULTURE     Status: Normal   Collection Time   06/22/12  3:35 PM      Component Value Range Status Comment   Specimen Description KIDNEY URINE   Final    Special Requests NONE   Final    Gram Stain     Final    Value: MODERATE WBC PRESENT,BOTH PMN AND MONONUCLEAR     NO ORGANISMS SEEN   Culture FEW ESCHERICHIA COLI   Final    Report Status 06/25/2012 FINAL   Final    Organism ID, Bacteria ESCHERICHIA COLI   Final   CULTURE, BLOOD (ROUTINE X 2)     Status: Normal (Preliminary result)   Collection Time   06/22/12  8:15 PM      Component Value Range Status Comment   Specimen Description BLOOD RIGHT ARM   Final    Special Requests BOTTLES DRAWN AEROBIC AND ANAEROBIC 10CC   Final    Culture  Setup Time 06/23/2012 01:34   Final    Culture     Final    Value:        BLOOD CULTURE RECEIVED NO GROWTH TO DATE CULTURE WILL BE HELD FOR 5 DAYS BEFORE ISSUING A FINAL NEGATIVE REPORT   Report Status PENDING   Incomplete   CULTURE, BLOOD (ROUTINE X 2)     Status: Normal (Preliminary result)   Collection Time   06/22/12  8:20 PM      Component Value Range Status Comment   Specimen Description BLOOD RIGHT ARM   Final    Special Requests BOTTLES DRAWN AEROBIC AND ANAEROBIC 10CC   Final    Culture  Setup Time 06/23/2012 01:35   Final    Culture     Final    Value:        BLOOD CULTURE RECEIVED NO GROWTH TO DATE CULTURE WILL BE HELD FOR 5 DAYS BEFORE ISSUING A FINAL NEGATIVE REPORT   Report Status PENDING   Incomplete      Studies: Ct Abdomen Pelvis Wo Contrast  06/21/2012  *RADIOLOGY REPORT*  Clinical Data: Right flank pain  CT ABDOMEN AND PELVIS WITHOUT CONTRAST  Technique:  Multidetector CT imaging of the abdomen and pelvis was performed following the standard protocol without intravenous contrast.   Comparison: 12/04/2009  Findings: Stable cholelithiasis.  Chronic changes at the lung bases.  There is mild left hydronephrosis.  Right hydronephrosis has  worsened.  There is significant perinephric stranding about the right kidney.  The patient is status post cystectomy with right lower quadrant ileal conduit.  Coronary artery calcifications are noted at the lung bases.  Extensive stool throughout the length of the colon is noted.  Left lower quadrant colostomy is in place.  Peristomal hernia containing several loops of colon is noted.  Small hernia through the pelvic floor on the right containing a small segment of small bowel is not significantly changed.  No disproportionate dilatation of small bowel to suggest obstruction.  Infrarenal abdominal aortic aneurysm is present.  Maximal AP diameter is 5.0 cm.  Previously, this was 4.4 cm.  Stable L1 compression deformity.  No definite evidence of acute appendicitis.  IMPRESSION: Compared the prior study, right hydronephrosis has worsened and right perinephric stranding has worsened.  No ureteral calculi are seen.  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  Previously noted small bowel obstruction pattern has resolved.  Infrarenal abdominal aortic aneurysm has increased from 4.4 cm to 5.0 cm.  Exam is otherwise unchanged.  Original Report Authenticated By: Donavan Burnet, M.D.    Scheduled Meds:    . alum & mag hydroxide-simeth  30 mL Oral Daily  . antiseptic oral rinse  15 mL Mouth Rinse BID  . aspirin EC  81 mg Oral Daily  . buPROPion  150 mg Oral BID  . ciprofloxacin  400 mg Intravenous Q12H  . fentaNYL      . gabapentin  300 mg Oral TID  . guaiFENesin  600 mg Oral BID  . heparin  5,000 Units Subcutaneous Q8H  . lidocaine      . metoCLOPramide (REGLAN) injection  5 mg Intravenous Q8H  . midazolam      . multivitamin with minerals  1 tablet Oral Daily  . omega-3 acid ethyl esters  1  g Oral BID  . simvastatin  20 mg Oral QHS   Continuous Infusions:    . sodium chloride 1,000 mL (06/26/12 1810)    Principal Problem:  *Pyelonephritis Active Problems:  Prostate cancer  COPD (chronic obstructive pulmonary disease)  Hydronephrosis, right  AAA (abdominal aortic aneurysm)  Ileus    Time spent: 25    [facility]  [facility] Pager [phone] . 06/27/2012, 8:01 AM  LOS: 6 days

## 2012-06-27 NOTE — Progress Notes (Signed)
Subjective: Feels better, no stool, some gas, no nausea.  Objective: Vital signs in last 24 hours: Temp:  [98.7 F (37.1 C)-99.7 F (37.6 C)] 99 F (37.2 C) (07/30 0644) Pulse Rate:  [70-89] 81  (07/30 0644) Resp:  [12-30] 18  (07/30 0644) BP: (122-154)/(73-89) 153/84 mmHg (07/30 0644) SpO2:  [84 %-95 %] 92 % (07/30 0644) Last BM Date: 06/21/12  1000 ml recorded from NG yesterday, 400 ml recorded day prior.  NO BM. TM 99, VSS, No labs today,  Intake/Output from previous day: 07/29 0701 - 07/30 0700 In: 600 [I.V.:600] Out: 2550 [Urine:1550; Emesis/NG output:1000] Intake/Output this shift:    General appearance: alert, cooperative, no distress and up in chair watching baseball. GI: soft, mildly distended, few bowel sounds, some flatus in ostomy bag, no stool.  Lab Results:   [facility] 06/26/12 0340  WBC 4.4  HGB 13.6  HCT 39.7  PLT 193    BMET  Basename 06/26/12 0340  NA 137  K 3.9  CL 99  CO2 27  GLUCOSE 99  BUN 27*  CREATININE 1.24  CALCIUM 9.4   PT/INR No results found for this basename: LABPROT:2,INR:2 in the last 72 hours   Lab 06/21/12 1630  AST 20  ALT 16  ALKPHOS 51  BILITOT 0.6  PROT 8.0  ALBUMIN 4.4     Lipase     Component Value Date/Time   LIPASE 23 06/21/2012 1630     Studies/Results: Ir Nephrostomy Tube Change  06/26/2012  *RADIOLOGY REPORT*  IR NEPHROSTOGRAM RIGHT, PRIOR NEPHROSTOMY TUBE CHANGE  Date: 06/26/2012  Clinical History: 83 male with a history of prostate cancer status post radicle cystectomy, 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  Chronic ureteral stenosis with right-sided hydronephrosis and progressive flank pain.  He had a percutaneous nephrostomy tube placed 06/22/2012.  He presents now for attempted conversion of percutaneous nephrostomy tube to internalized double- J ureteral stent.  Procedures Performed: 1. Antegrade right nephroureterogram 2.  Attempts at crossing distal ureteral  obstruction 3.  Replacement of a new 10-French percutaneous nephrostomy tube  Interventional Radiologist:  Sterling Big, MD  Sedation: Moderate (conscious) sedation was used.  2 mg Versed, 200 mcg Fentanyl were administered intravenously.  The patient's vital signs were monitored continuously by radiology nursing throughout the procedure.  Sedation Time: 30 minutes  Fluoroscopy time: 12.7  Contrast volume: 50 ml Omnipaque administered into the collecting system.  PROCEDURE/FINDINGS:   Informed consent was obtained from the patient following explanation of the procedure, risks, benefits and alternatives. The patient understands, agrees and consents for the procedure. All questions were addressed. A time out was performed.  Maximal barrier sterile technique utilized including caps, mask, sterile gowns, sterile gloves, large sterile drape, hand hygiene, and chlorhexidine skin prep.  An antegrade nephroureterogram was performed by hand injection of contrast material the existing right 10-French percutaneous nephrostomy tube.  No contrast material passed the distal ureter. The existing nephrostomy tube was transected, and a wire advanced into the ureter.  The nephrostomy tube was then removed over the wire and an angled Kumpe catheter advanced over the wire into the distal ureter.  Repeat contrast injection through the catheter into the distal ureter showed a blind ending, completely occluded distal ureter.  There is no contrast material extending into the ileostomy, despite forceful injection.  Multiple attempts were made at passing a hydrophilic wire across the ureteral obstruction. These were ultimately unsuccessful.  The findings of the case were discussed with Dr. Vernie Ammons by  Dr. Archer Asa.  The decision was jointly made to replace the percutaneous nephrostomy tube into the lower pole moiety.  A 10- French percutaneous nephrostomy tube was then placed over the wire and the locking Cope loop formed in the  lower pole renal pelvis. The tube was secured to the skin with Prolene suture.  The patient tolerated the procedure well, there was no immediate complication. He was returned to the floor in stable condition.  IMPRESSION:  1.  Antegrade nephrostogram demonstrates complete occlusion of the distal right ureter.  2.  Attempts at crossing the distal ureteral obstruction were unsuccessful.  3.  Successful exchange of 10-French percutaneous nephrostomy tube. This tube will be left to gravity drainage.  If surgical revision is not an option, the patient will require routine tube check and changes every 6 - 8 weeks.  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  Chronic ureteral stenosis with right-sided hydronephrosis and progressive flank pain.  He had a percutaneous nephrostomy tube placed 06/22/2012.  He presents now for attempted conversion of percutaneous nephrostomy tube to internalized double- J ureteral stent.  Procedures Performed: 1. Antegrade right nephroureterogram 2.  Attempts at crossing distal ureteral obstruction 3.  Replacement of a new 10-French percutaneous nephrostomy tube  Interventional Radiologist:  Sterling Big, MD  Sedation: Moderate (conscious) sedation was used.  2 mg Versed, 200 mcg Fentanyl were administered intravenously.  The patient's vital signs were monitored continuously by radiology nursing throughout the procedure.  Sedation Time: 30 minutes  Fluoroscopy time: 12.7  Contrast volume: 50 ml Omnipaque administered into the collecting system.  PROCEDURE/FINDINGS:   Informed consent was obtained from the patient  following explanation of the procedure, risks, benefits and alternatives. The patient understands, agrees and consents for the procedure. All questions were addressed. A time out was performed.  Maximal barrier sterile technique utilized including caps, mask, sterile gowns, sterile gloves, large sterile drape, hand hygiene, and chlorhexidine skin prep.  An antegrade nephroureterogram was performed by hand injection of contrast material the existing right 10-French percutaneous nephrostomy tube.  No contrast material passed the distal ureter. The existing nephrostomy tube was transected, and a wire advanced into the ureter.  The nephrostomy tube was then removed over the wire and an angled Kumpe catheter advanced over the wire into the distal ureter.  Repeat contrast injection through the catheter into the distal ureter showed a blind ending, completely occluded distal ureter.  There is no contrast material extending into the ileostomy, despite forceful injection.  Multiple attempts were made at passing a hydrophilic wire across the ureteral obstruction. These were ultimately unsuccessful.  The findings of the case were discussed with Dr. Vernie Ammons by Dr. Archer Asa.  The decision was jointly made to replace the percutaneous nephrostomy tube into the lower pole moiety.  A 10- French percutaneous nephrostomy tube was then placed over the wire and the locking Cope loop formed in the lower pole renal pelvis. The tube was secured to the skin with Prolene suture.  The patient tolerated the procedure well, there was no immediate complication. He was returned to the floor in stable condition.  IMPRESSION:  1.  Antegrade nephrostogram demonstrates complete occlusion of the  distal right ureter.  2.  Attempts at crossing the distal ureteral obstruction were unsuccessful.  3.  Successful exchange of 10-French percutaneous nephrostomy tube. This tube will be left to gravity drainage.  If surgical revision is not an option, the  patient will require routine tube check and changes every 6 - 8 weeks.  Signed,  Sterling Big, MD Vascular & Interventional Radiologist [facility] Radiology  Original Report Authenticated By: Cay Schillings Portable 2v  06/26/2012  *RADIOLOGY REPORT*  Clinical Data: Follow-up evaluation for abdominal pain.  PORTABLE ABDOMEN - 2 VIEW  Comparison: Abdominal radiographs 06/25/2012.  Findings: There is a pigtail drainage catheter in place projecting over the right mid abdomen.  The nasogastric tube is seen with tip at the end of the proximal duodenum or the antral prepyloric region of the stomach.  Some gas and stool is seen in the colon to the level of the distal transverse colon (likely at the entrance site into the patient's large left-sided parastomal hernia).  Multiple dilated loops of small bowel are seen measuring up to 4.0 cm in diameter.  There is a tiny amount of gas projecting over the lower pelvis, however, it is uncertain whether not this represents rectal gas or (more likely) gas within some distal small bowel loops that have fallen into the lower anatomic pelvis (based on comparison with recent CT scan 06/21/2012 this is favored).  No gross evidence of pneumoperitoneum.  IMPRESSION: 1. Overall, the degree of gaseous distension has significantly improved compared to yesterday's examination.  While there continues to be some mild dilatation of small bowel, the overall appearance is one of a resolving ileus or partial small bowel obstruction. 2.  Support apparatus, as above.  Original Report Authenticated By: Florencia Reasons, M.D.    Medications:    . alum & mag hydroxide-simeth  30 mL Oral Daily  . antiseptic oral rinse  15 mL Mouth Rinse BID  . aspirin EC  81 mg Oral Daily  . buPROPion  150 mg Oral BID  . ciprofloxacin  400 mg Intravenous Q12H  . fentaNYL      . gabapentin  300 mg Oral TID  . guaiFENesin  600 mg Oral BID  . heparin  5,000 Units Subcutaneous Q8H  . lidocaine        . metoCLOPramide (REGLAN) injection  5 mg Intravenous Q8H  . midazolam      . multivitamin with minerals  1 tablet Oral Daily  . omega-3 acid ethyl esters  1 g Oral BID  . simvastatin  20 mg Oral QHS    Assessment/Plan Ileus vs SBO  Worsening hydronephrosis- s/p placement of a R nephrostomy tube with a double J stent placement later today, culture of urine sent-e coli abx: cipro Pyelonephritis- culture urine- ecoli  COPD-  AAA- follow up with vascular surgery as an outpatient  Prostate Ca   Plan:  NG clamping trials, he's got ice chips, see how he does. NG out later today or tomorrow pending results of clamping trials.       LOS: 6 days    Wai Litt 06/27/2012

## 2012-06-27 NOTE — Progress Notes (Signed)
Physical Therapy Treatment Patient Details Name: Michael Novak MRN: [id number] DOB: 1930/03/30 Today's Date: 06/27/2012 Time: 1600-1630 PT Time Calculation (min): 30 min  PT Assessment / Plan / Recommendation Comments on Treatment Session  Improved today!  Pt was able to walk on RA with O2 sats >90%. RN actively working on weaning pt from O2. Anticipate pt will be able to D/C to home with HHPT when medically ready     Follow Up Recommendations  Home health PT;Supervision/Assistance - 24 hour    Barriers to Discharge        Equipment Recommendations  None recommended by PT    Recommendations for Other Services    Frequency Min 3X/week   Plan Discharge plan remains appropriate    Precautions / Restrictions Precautions Precautions: Fall Precaution Comments: Multiple drains, lines.  Restrictions Weight Bearing Restrictions: No   Pertinent Vitals/Pain No c/o pain.  Pt with activity/ gait on RA with O2 sats . 90%    Mobility  Bed Mobility Bed Mobility: Supine to Sit Supine to Sit: 4: Min assist Details for Bed Mobility Assistance: pt needed some assist raising upper body into sitting Transfers Transfers: Stand to Sit;Sit to Stand Sit to Stand: 4: Min assist Stand to Sit: 4: Min assist Details for Transfer Assistance: VC's to push up with arms pt repeated sit to stand ~ 10 times for strengthening and technique Ambulation/Gait Ambulation/Gait Assistance: 4: Min assist Ambulation Distance (Feet): 150 Feet (75x2) Assistive device: Rolling walker Ambulation/Gait Assistance Details: pt needed assist to direct RW and frequent verbal cues to extend trunk and hips to step up into RW Gait Pattern: Step-through pattern;Decreased stride length;Decreased step length - right;Decreased step length - left Gait velocity: decreased General Gait Details: pt stands too far behind RW and pushes it, but is able to correct posture with cues pt able to walk on RA with 02 sats at > 90 Wheelchair  Mobility Wheelchair Mobility: No    Exercises Other Exercises Other Exercises: incentive spirometry Other Exercises: glute sets Other Exercises: sit to stand with trunk extension   PT Diagnosis:    PT Problem List:   PT Treatment Interventions:     PT Goals Acute Rehab PT Goals PT Goal: Supine/Side to Sit - Progress: Progressing toward goal PT Goal: Sit to Stand - Progress: Progressing toward goal PT Goal: Ambulate - Progress: Progressing toward goal  Visit Information  Last PT Received On: 06/27/12 Assistance Needed: +1 Reason Eval/Treat Not Completed: Other (comment) (pt just returned to bed after having been up several hours)    Subjective Data  Subjective: pt motivated to walk Patient Stated Goal: to go home   Cognition  Overall Cognitive Status: Appears within functional limits for tasks assessed/performed Arousal/Alertness: Awake/alert Orientation Level: Appears intact for tasks assessed Behavior During Session: [name] for tasks performed    Balance  Balance Balance Assessed: No  End of Session PT - End of Session Activity Tolerance: Patient tolerated treatment well Patient left: in bed;with call bell/phone within reach;with family/visitor present Nurse Communication: Mobility status   GP     Donnetta Hail 06/27/2012, 4:58 PM

## 2012-06-28 DIAGNOSIS — N179 Acute kidney failure, unspecified: Secondary | ICD-10-CM | POA: Diagnosis present

## 2012-06-28 DIAGNOSIS — IMO0002 Reserved for concepts with insufficient information to code with codable children: Secondary | ICD-10-CM

## 2012-06-28 DIAGNOSIS — R5381 Other malaise: Secondary | ICD-10-CM | POA: Diagnosis present

## 2012-06-28 MED ORDER — CIPROFLOXACIN HCL 500 MG PO TABS
500.0000 mg | ORAL_TABLET | Freq: Two times a day (BID) | ORAL | Status: DC
  Administered 2012-06-28 – 2012-06-30 (×5): 500 mg via ORAL
  Filled 2012-06-28 (×8): qty 1

## 2012-06-28 MED ORDER — BISACODYL 5 MG PO TBEC
10.0000 mg | DELAYED_RELEASE_TABLET | Freq: Every day | ORAL | Status: DC
  Administered 2012-06-28 – 2012-07-01 (×3): 10 mg via ORAL
  Administered 2012-07-02: 5 mg via ORAL
  Filled 2012-06-28: qty 1
  Filled 2012-06-28: qty 2
  Filled 2012-06-28 (×3): qty 1

## 2012-06-28 MED ORDER — OXYCODONE-ACETAMINOPHEN 5-325 MG PO TABS
1.0000 | ORAL_TABLET | Freq: Four times a day (QID) | ORAL | Status: DC | PRN
  Administered 2012-07-01: 1 via ORAL
  Filled 2012-06-28: qty 1

## 2012-06-28 MED ORDER — TIOTROPIUM BROMIDE MONOHYDRATE 18 MCG IN CAPS
18.0000 ug | ORAL_CAPSULE | Freq: Every day | RESPIRATORY_TRACT | Status: DC
  Administered 2012-06-28 – 2012-07-02 (×5): 18 ug via RESPIRATORY_TRACT
  Filled 2012-06-28 (×2): qty 5

## 2012-06-28 MED ORDER — METOCLOPRAMIDE HCL 5 MG PO TABS
5.0000 mg | ORAL_TABLET | Freq: Three times a day (TID) | ORAL | Status: DC
  Administered 2012-06-28 – 2012-06-30 (×10): 5 mg via ORAL
  Filled 2012-06-28 (×15): qty 1

## 2012-06-28 MED ORDER — ALBUTEROL SULFATE HFA 108 (90 BASE) MCG/ACT IN AERS
1.0000 | INHALATION_SPRAY | RESPIRATORY_TRACT | Status: DC | PRN
  Filled 2012-06-28: qty 6.7

## 2012-06-28 NOTE — Progress Notes (Signed)
Patient ID: BETZALEL UMBARGER, male   DOB: 01-31-1930, 78 y.o.   MRN: [id number]    Subjective: Patient offers no c/o some flatus in ostomy bag, no fever, chills or nausea, emesis. tolerating clears well.   Objective: Vital signs in last 24 hours: Temp:  [98.5 F (36.9 C)-99.7 F (37.6 C)] 98.5 F (36.9 C) (07/31 0515) Pulse Rate:  [73-90] 73  (07/31 0515) Resp:  [16-20] 17  (07/31 0515) BP: (129-153)/(75-81) 132/75 mmHg (07/31 0515) SpO2:  [87 %-93 %] 88 % (07/31 0515) Last BM Date: 06/21/12   NO BM. Afebrile, VSS.  Intake/Output from previous day: 07/30 0701 - 07/31 0700 In: 1295 [P.O.:240; I.V.:805] Out: 1450 [Urine:1450] Intake/Output this shift: Total I/O In: 120 [P.O.:120] Out: -   General appearance: No acute distress, no c/o offered, a/a/o Chest: CTA bilaterally Abdomen: some flatus in ostomy bag, no BM, no bloating or nausea,emesis Urostomy draining well.  Lab Results:   [facility] 06/26/12 0340  WBC 4.4  HGB 13.6  HCT 39.7  PLT 193    BMET  Basename 06/26/12 0340  NA 137  K 3.9  CL 99  CO2 27  GLUCOSE 99  BUN 27*  CREATININE 1.24  CALCIUM 9.4   PT/INR No results found for this basename: LABPROT:2,INR:2 in the last 72 hours   Lab 06/21/12 1630  AST 20  ALT 16  ALKPHOS 51  BILITOT 0.6  PROT 8.0  ALBUMIN 4.4     Lipase     Component Value Date/Time   LIPASE 23 06/21/2012 1630     Studies/Results: Ir Nephrostomy Tube Change  06/26/2012  *RADIOLOGY REPORT*  IR NEPHROSTOGRAM RIGHT, PRIOR NEPHROSTOMY TUBE CHANGE  Date: 06/26/2012  Clinical History: 55 male with a history of prostate cancer status post radicle cystectomy, 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  Chronic ureteral stenosis with right-sided hydronephrosis and progressive flank pain.  He had a percutaneous nephrostomy tube placed 06/22/2012.  He presents now for attempted conversion of percutaneous nephrostomy tube to internalized double- J ureteral stent.   Procedures Performed: 1. Antegrade right nephroureterogram 2.  Attempts at crossing distal ureteral obstruction 3.  Replacement of a new 10-French percutaneous nephrostomy tube  Interventional Radiologist:  Sterling Big, MD  Sedation: Moderate (conscious) sedation was used.  2 mg Versed, 200 mcg Fentanyl were administered intravenously.  The patient's vital signs were monitored continuously by radiology nursing throughout the procedure.  Sedation Time: 30 minutes  Fluoroscopy time: 12.7  Contrast volume: 50 ml Omnipaque administered into the collecting system.  PROCEDURE/FINDINGS:   Informed consent was obtained from the patient following explanation of the procedure, risks, benefits and alternatives. The patient understands, agrees and consents for the procedure. All questions were addressed. A time out was performed.  Maximal barrier sterile technique utilized including caps, mask, sterile gowns, sterile gloves, large sterile drape, hand hygiene, and chlorhexidine skin prep.  An antegrade nephroureterogram was performed by hand injection of contrast material the existing right 10-French percutaneous nephrostomy tube.  No contrast material passed the distal ureter. The existing nephrostomy tube was transected, and a wire advanced into the ureter.  The nephrostomy tube was then removed over the wire and an angled Kumpe catheter advanced over the wire into the distal ureter.  Repeat contrast injection through the catheter into the distal ureter showed a blind ending, completely occluded distal ureter.  There is no contrast material extending into the ileostomy, despite forceful injection.  Multiple attempts were made at passing a hydrophilic  wire across the ureteral obstruction. These were ultimately unsuccessful.  The findings of the case were discussed with Dr. Vernie Ammons by Dr. Archer Asa.  The decision was jointly made to replace the percutaneous nephrostomy tube into the lower pole moiety.  A 10- French  percutaneous nephrostomy tube was then placed over the wire and the locking Cope loop formed in the lower pole renal pelvis. The tube was secured to the skin with Prolene suture.  The patient tolerated the procedure well, there was no immediate complication. He was returned to the floor in stable condition.  IMPRESSION:  1.  Antegrade nephrostogram demonstrates complete occlusion of the distal right ureter.  2.  Attempts at crossing the distal ureteral obstruction were unsuccessful.  3.  Successful exchange of 10-French percutaneous nephrostomy tube. This tube will be left to gravity drainage.  If surgical revision is not an option, the patient will require routine tube check and changes every 6 - 8 weeks.  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  Chronic ureteral stenosis with right-sided hydronephrosis and progressive flank pain.  He had a percutaneous nephrostomy tube placed 06/22/2012.  He presents now for attempted conversion of percutaneous nephrostomy tube to internalized double- J ureteral stent.  Procedures Performed: 1. Antegrade right nephroureterogram 2.  Attempts at crossing distal ureteral obstruction 3.  Replacement of a new 10-French percutaneous nephrostomy tube  Interventional Radiologist:  Sterling Big, MD  Sedation: Moderate (conscious) sedation was used.  2 mg Versed, 200 mcg Fentanyl were administered intravenously.  The patient's vital signs were monitored continuously by radiology nursing throughout the procedure.  Sedation Time: 30 minutes  Fluoroscopy time: 12.7  Contrast volume: 50 ml Omnipaque administered  into the collecting system.  PROCEDURE/FINDINGS:   Informed consent was obtained from the patient following explanation of the procedure, risks, benefits and alternatives. The patient understands, agrees and consents for the procedure. All questions were addressed. A time out was performed.  Maximal barrier sterile technique utilized including caps, mask, sterile gowns, sterile gloves, large sterile drape, hand hygiene, and chlorhexidine skin prep.  An antegrade nephroureterogram was performed by hand injection of contrast material the existing right 10-French percutaneous nephrostomy tube.  No contrast material passed the distal ureter. The existing nephrostomy tube was transected, and a wire advanced into the ureter.  The nephrostomy tube was then removed over the wire and an angled Kumpe catheter advanced over the wire into the distal ureter.  Repeat contrast injection through the catheter into the distal ureter showed a blind ending, completely occluded distal ureter.  There is no contrast material extending into the ileostomy, despite forceful injection.  Multiple attempts were made at passing a hydrophilic wire across the ureteral obstruction. These were ultimately unsuccessful.  The findings of the case were discussed with Dr. Vernie Ammons by Dr. Archer Asa.  The decision was jointly made to replace the percutaneous nephrostomy tube into the lower pole moiety.  A 10- French percutaneous nephrostomy tube was then placed over the wire and the locking Cope loop formed in the lower pole renal pelvis. The tube was secured to the skin with Prolene suture.  The patient tolerated the procedure well, there was no immediate complication.  He was returned to the floor in stable condition.  IMPRESSION:  1.  Antegrade nephrostogram demonstrates complete occlusion of the distal right ureter.  2.  Attempts at crossing the distal ureteral obstruction were unsuccessful.  3.  Successful exchange of 10-French percutaneous nephrostomy  tube. This tube will be left to gravity drainage.  If surgical revision is not an option, the patient will require routine tube check and changes every 6 - 8 weeks.  Signed,  Sterling Big, MD Vascular & Interventional Radiologist [facility] Radiology  Original Report Authenticated By: Vilma Prader    Medications:    . alum & mag hydroxide-simeth  30 mL Oral Daily  . antiseptic oral rinse  15 mL Mouth Rinse BID  . aspirin EC  81 mg Oral Daily  . buPROPion  150 mg Oral BID  . ciprofloxacin  400 mg Intravenous Q12H  . gabapentin  300 mg Oral TID  . guaiFENesin  600 mg Oral BID  . heparin  5,000 Units Subcutaneous Q8H  . metoCLOPramide (REGLAN) injection  5 mg Intravenous Q8H  . multivitamin with minerals  1 tablet Oral Daily  . omega-3 acid ethyl esters  1 g Oral BID  . simvastatin  20 mg Oral QHS    Assessment/Plan Ileus vs SBO  Worsening hydronephrosis- s/p placement of a R nephrostomy tube with a double J stent placement i  COPD-  AAA- follow up with vascular surgery as an outpatient  Prostate Ca   Plan:   Cont with sips of clears for now Encourage OOB, IS Continue with PT/OT       LOS: 7 days    Freddi Forster 06/28/2012

## 2012-06-28 NOTE — Progress Notes (Signed)
Subjective: Pt ok. Resting. Dr. Vernie Ammons note reviewed. Follow with Dr. Logan Bores.  Objective: Physical Exam: BP 132/75  Pulse 73  Temp 98.5 F (36.9 C) (Oral)  Resp 17  Ht 6\' 2"  (1.88 m)  Wt 218 lb 0.6 oz (98.9 kg)  BMI 27.99 kg/m2  SpO2 88% (R)PCN intact, no tenderness or redness at site. UOP clear.    Labs: CBC  Basename 06/26/12 0340  WBC 4.4  HGB 13.6  HCT 39.7  PLT 193   BMET  Basename 06/26/12 0340  NA 137  K 3.9  CL 99  CO2 27  GLUCOSE 99  BUN 27*  CREATININE 1.24  CALCIUM 9.4   LFT No results found for this basename: PROT,ALBUMIN,AST,ALT,ALKPHOS,BILITOT,BILIDIR,IBILI,LIPASE in the last 72 hours PT/INR No results found for this basename: LABPROT:2,INR:2 in the last 72 hours   Studies/Results: Ir Nephrostomy Tube Change  06/26/2012  *RADIOLOGY REPORT*  IR NEPHROSTOGRAM RIGHT, PRIOR NEPHROSTOMY TUBE CHANGE  Date: 06/26/2012  Clinical History: 46 male with a history of prostate cancer status post radicle cystectomy, 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  Chronic ureteral stenosis with right-sided hydronephrosis and progressive flank pain.  He had a percutaneous nephrostomy tube placed 06/22/2012.  He presents now for attempted conversion of percutaneous nephrostomy tube to internalized double- J ureteral stent.  Procedures Performed: 1. Antegrade right nephroureterogram 2.  Attempts at crossing distal ureteral obstruction 3.  Replacement of a new 10-French percutaneous nephrostomy tube  Interventional Radiologist:  Sterling Big, MD  Sedation: Moderate (conscious) sedation was used.  2 mg Versed, 200 mcg Fentanyl were administered intravenously.  The patient's vital signs were monitored continuously by radiology nursing throughout the procedure.  Sedation Time: 30 minutes  Fluoroscopy time: 12.7  Contrast volume: 50 ml Omnipaque administered into the collecting system.  PROCEDURE/FINDINGS:   Informed consent was obtained from the  patient following explanation of the procedure, risks, benefits and alternatives. The patient understands, agrees and consents for the procedure. All questions were addressed. A time out was performed.  Maximal barrier sterile technique utilized including caps, mask, sterile gowns, sterile gloves, large sterile drape, hand hygiene, and chlorhexidine skin prep.  An antegrade nephroureterogram was performed by hand injection of contrast material the existing right 10-French percutaneous nephrostomy tube.  No contrast material passed the distal ureter. The existing nephrostomy tube was transected, and a wire advanced into the ureter.  The nephrostomy tube was then removed over the wire and an angled Kumpe catheter advanced over the wire into the distal ureter.  Repeat contrast injection through the catheter into the distal ureter showed a blind ending, completely occluded distal ureter.  There is no contrast material extending into the ileostomy, despite forceful injection.  Multiple attempts were made at passing a hydrophilic wire across the ureteral obstruction. These were ultimately unsuccessful.  The findings of the case were discussed with Dr. Vernie Ammons by Dr. Archer Asa.  The decision was jointly made to replace the percutaneous nephrostomy tube into the lower pole moiety.  A 10- French percutaneous nephrostomy tube was then placed over the wire and the locking Cope loop formed in the lower pole renal pelvis. The tube was secured to the skin with Prolene suture.  The patient tolerated the procedure well, there was no immediate complication. He was returned to the floor in stable condition.  IMPRESSION:  1.  Antegrade nephrostogram demonstrates complete occlusion of the distal right ureter.  2.  Attempts at crossing the distal ureteral obstruction were unsuccessful.  3.  Successful exchange of 10-French percutaneous nephrostomy tube. This tube will be left to gravity drainage.  If surgical revision is not an option,  the patient will require routine tube check and changes every 6 - 8 weeks.  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  Chronic ureteral stenosis with right-sided hydronephrosis and progressive flank pain.  He had a percutaneous nephrostomy tube placed 06/22/2012.  He presents now for attempted conversion of percutaneous nephrostomy tube to internalized double- J ureteral stent.  Procedures Performed: 1. Antegrade right nephroureterogram 2.  Attempts at crossing distal ureteral obstruction 3.  Replacement of a new 10-French percutaneous nephrostomy tube  Interventional Radiologist:  Sterling Big, MD  Sedation: Moderate (conscious) sedation was used.  2 mg Versed, 200 mcg Fentanyl were administered intravenously.  The patient's vital signs were monitored continuously by radiology nursing throughout the procedure.  Sedation Time: 30 minutes  Fluoroscopy time: 12.7  Contrast volume: 50 ml Omnipaque administered into the collecting system.  PROCEDURE/FINDINGS:   Informed consent was obtained from the patient following explanation of the procedure, risks, benefits and alternatives. The patient understands, agrees and consents for the procedure. All questions were addressed. A time out was performed.  Maximal barrier sterile technique utilized including caps, mask, sterile gowns, sterile gloves, large sterile drape, hand hygiene, and chlorhexidine skin prep.  An antegrade nephroureterogram was performed by hand injection of contrast material the existing right 10-French percutaneous nephrostomy tube.  No contrast material passed the distal  ureter. The existing nephrostomy tube was transected, and a wire advanced into the ureter.  The nephrostomy tube was then removed over the wire and an angled Kumpe catheter advanced over the wire into the distal ureter.  Repeat contrast injection through the catheter into the distal ureter showed a blind ending, completely occluded distal ureter.  There is no contrast material extending into the ileostomy, despite forceful injection.  Multiple attempts were made at passing a hydrophilic wire across the ureteral obstruction. These were ultimately unsuccessful.  The findings of the case were discussed with Dr. Vernie Ammons by Dr. Archer Asa.  The decision was jointly made to replace the percutaneous nephrostomy tube into the lower pole moiety.  A 10- French percutaneous nephrostomy tube was then placed over the wire and the locking Cope loop formed in the lower pole renal pelvis. The tube was secured to the skin with Prolene suture.  The patient tolerated the procedure well, there was no immediate complication. He was returned to the floor in stable condition.  IMPRESSION:  1.  Antegrade nephrostogram demonstrates complete occlusion of the distal right ureter.  2.  Attempts at crossing the distal ureteral obstruction were unsuccessful.  3.  Successful exchange of 10-French percutaneous nephrostomy tube. This tube will be left to gravity drainage.  If surgical revision is not an option, the patient will require routine tube check and changes every 6 - 8 weeks.  Signed,  Sterling Big, MD Vascular & Interventional Radiologist [facility] Radiology  Original Report Authenticated By: Vilma Prader    Assessment/Plan: S/p right PCN exchange 7/29 with unsuccessful attempt at ureteral stent placement secondary to complete occlusion of distal ureter; plans as per urology. If nephrostomy tube remains  will need to be exchanged every 6-8 weeks.   LOS: 7 days    Brayton El PA-C 06/28/2012 10:02 AM

## 2012-06-28 NOTE — Progress Notes (Signed)
Physical Therapy Treatment Patient Details Name: Michael Novak MRN: [id number] DOB: May 18, 1930 Today's Date: 06/28/2012 Time: 1130-1150 PT Time Calculation (min): 20 min  PT Assessment / Plan / Recommendation Comments on Treatment Session  Continues to improve with mobility and ambulation. O2 sats 93% after ambulation on RA.  Encouraged pt to walk with nursing and family several times a day    Follow Up Recommendations  Home health PT;Supervision/Assistance - 24 hour    Barriers to Discharge        Equipment Recommendations  None recommended by PT    Recommendations for Other Services OT consult  Frequency Min 3X/week   Plan Discharge plan remains appropriate    Precautions / Restrictions Precautions Precautions: Fall Precaution Comments: Multiple drains, lines.  Restrictions Weight Bearing Restrictions: No   Pertinent Vitals/Pain O2 sats 93% after walking on RA, no c/o pain    Mobility  Bed Mobility Bed Mobility: Supine to Sit Supine to Sit: 5: Supervision Details for Bed Mobility Assistance: pt with some difficulty on hospital bed mattress Transfers Transfers: Stand to Sit;Sit to Stand Sit to Stand: 4: Min guard Stand to Sit: 4: Min guard Details for Transfer Assistance: VC's to push up with arms pt repeated sit to stand ~ 5 times for strengthening and technique Ambulation/Gait Ambulation/Gait Assistance: 4: Min guard Ambulation Distance (Feet): 200 Feet Assistive device: Rolling walker Ambulation/Gait Assistance Details: cues to stand erect and take bigger steps Gait Pattern: Step-through pattern;Decreased step length - right;Decreased step length - left General Gait Details: improved posture today with better stepping with each leg Stairs: No    Exercises Other Exercises Other Exercises: incentive spirometry Other Exercises: glute sets Other Exercises: sit to stand with trunk extension   PT Diagnosis:    PT Problem List:   PT Treatment Interventions:      PT Goals Acute Rehab PT Goals PT Goal: Supine/Side to Sit - Progress: Met PT Goal: Sit to Supine/Side - Progress: Progressing toward goal PT Goal: Sit to Stand - Progress: Progressing toward goal PT Goal: Ambulate - Progress: Progressing toward goal  Visit Information  Last PT Received On: 06/28/12 Assistance Needed: +1    Subjective Data  Subjective: son, Atsushi, in room and encouraging to patient Patient Stated Goal: to go home   Cognition  Overall Cognitive Status: Appears within functional limits for tasks assessed/performed Arousal/Alertness: Awake/alert Orientation Level: Appears intact for tasks assessed Behavior During Session: [facility] for tasks performed    Balance  Balance Balance Assessed: No  End of Session PT - End of Session Activity Tolerance: Patient tolerated treatment well Patient left: with call bell/phone within reach;with family/visitor present;in chair Nurse Communication: Mobility status   GP     Donnetta Hail 06/28/2012, 12:02 PM

## 2012-06-28 NOTE — Progress Notes (Signed)
Add dulcolax.   May need irrigation via stoma

## 2012-06-28 NOTE — Progress Notes (Addendum)
[facility] PROGRESS NOTE  KALIEL BOLDS WUJ:[id number] DOB: November 15, 1930 DOA: 06/21/2012 PCP: Kimber Relic, MD  Assessment/Plan: Principal Problem:  *Pyelonephritis  Placed on empiric Cipro upon admission.   Urine cultures grew Escherichia coli, Cipro sensitive. Active Problems:  Dyslipidemia  Continue statin and fish oil.  H/O colostomy / Prostate cancer  Status post radioactive seed implantation complicated by prostatic-rectal fistula.  Status post cystoprostatectomy with ileal conduit as well as proctocolectomy with colostomy.  COPD (chronic obstructive pulmonary disease)  Stable. Resume Spiriva and when necessary Ventolin.  Hydronephrosis, right  Progressive worsening of his right hydronephrosis noted by urologist.  Right cutaneous nephrostomy tube was placed by IR 06/22/2012. A tube exchange along with a right retrograde nephroureterogram was done 06/26/2012. Attempt to internalize the tube with a double-J stent was made on 7/RD/2013 but this could not be done due to what appeared to be a complete obstruction/obliteration of the distal portion of the ureter on the right side just before it enters the conduit. Nephrostomy tube will need to be exchanged every 6-8 weeks.  Followup with Dr. Logan Bores as an outpatient once discharged, will need consideration of revision of his right ureteral-ileal anastomosis.  AAA (abdominal aortic aneurysm)  Followup with vascular surgeon as an outpatient.  Continue blood pressure control.  Ileus  Developed abdominal distention and nausea 06/23/12 prompting a abdominal x-ray.  Findings showed an ileus versus a SBO.  NG tube placed 06/24/12.  Surgical consultation requested and kindly provided by Dr. Johna Sheriff on 06/24/2012. Bowel rest and ongoing NG tube suction recommended along with minimizing narcotic pain medication use.  Serial abdominal films were obtained, which showed gradual improvement.  NG clamping trials were begun on  06/27/2012. NG tube dislodged 06/27/2012.  No further nausea or vomiting. Tolerating clear liquids. Advance diet to full liquids.  Physical deconditioning  Physical therapy evaluation done on 06/26/2012. Home health physical therapy with 24-hour supervision recommended.  AKI (acute kidney injury)  Secondary to right hydronephrosis. Once nephrostomy tube was placed, patient's creatinine began to improve.   Code Status: Full  Family Communication:  Novah Goza (son) [phone]. Disposition Plan: Home with home health services.    Brief narrative: Mr. Landstrom is an 78 year old man with past medical history of prostate cancer, status post cystectomy, ileal conduit, and urostomy for rectal-ureteral fistula from radioactive seed implantation, ureteral stenosis, and abdominal aortic aneurysm who presented to the emergency department on 7/24/thousand pain severe progressive right flank pain. He was found to have worsening right hydronephrosis, suspicious for worsening of the ureteral anastomosis with perinephric stranding.  Medical Consultants:  Dr. Glenna Fellows, Surgery  Dr. Ihor Gully, Urology  Dr. Malachy Moan and Dr. Richarda Overlie, IR  Other Consultants:  Physical therapy  Procedures:  Placement of right percutaneous nephrostomy tube 06/22/2012.  Right antegrade nephroureterogram 06/26/12. 10 French nephrostomy tube exchange for new tube.  Antibiotics:  Cipro 06/21/2012--->  HPI/Subjective: Mr. Carden reports flatus and his ostomy pouch but no solid stools. No nausea or vomiting since his NG tube dislodged. Feels well otherwise. No dyspnea or cough.  Objective: Filed Vitals:   06/27/12 1331 06/27/12 1355 06/27/12 2030 06/28/12 0515  BP: 153/81  129/77 132/75  Pulse: 90  84 73  Temp: 99.1 F (37.3 C)  99.7 F (37.6 C) 98.5 F (36.9 C)  TempSrc: Oral  Oral Oral  Resp: 20  16 17   Height:      Weight:      SpO2: 93% 93% 87% 88%    Intake/Output  Summary (Last 24  hours) at 06/28/12 1123 Last data filed at 06/28/12 0900  Gross per 24 hour  Intake   1415 ml  Output   1050 ml  Net    365 ml    Exam: Gen:  NAD Cardiovascular:  RRR, No M/R/G Respiratory: Lungs CTAB Gastrointestinal: Abdomen soft, NT/ND with normal active bowel sounds. Flatus in ostomy pouch. Extremities: No C/E/C  Data Reviewed: Basic Metabolic Panel:  Lab 06/26/12 1610 06/24/12 0420 06/22/12 0349 06/21/12 1630  NA 137 136 136 137  K 3.9 4.2 -- --  CL 99 99 101 99  CO2 27 28 25 21   GLUCOSE 99 130* 116* 130*  BUN 27* 23 29* 30*  CREATININE 1.24 1.42* 1.67* 1.48*  CALCIUM 9.4 9.1 8.9 9.9  MG -- -- -- --  PHOS -- -- -- --   GFR Estimated Creatinine Clearance: 59.7 ml/min (by C-G formula based on Cr of 1.24). Liver Function Tests:  Lab 06/21/12 1630  AST 20  ALT 16  ALKPHOS 51  BILITOT 0.6  PROT 8.0  ALBUMIN 4.4    Lab 06/21/12 1630  LIPASE 23  AMYLASE --   CBC:  Lab 06/26/12 0340 06/24/12 0420 06/22/12 0349 06/21/12 1630  WBC 4.4 7.3 9.2 9.3  NEUTROABS -- -- -- 7.5  HGB 13.6 13.2 14.1 16.0  HCT 39.7 39.0 41.2 45.7  MCV 90.2 90.5 89.6 89.3  PLT 193 156 161 176   Microbiology Recent Results (from the past 240 hour(s))  URINE CULTURE     Status: Normal   Collection Time   06/21/12  6:57 PM      Component Value Range Status Comment   Specimen Description URINE, CLEAN CATCH   Final    Special Requests NONE   Final    Culture  Setup Time 06/22/2012 03:21   Final    Colony Count NO GROWTH   Final    Culture NO GROWTH   Final    Report Status 06/22/2012 FINAL   Final   BODY FLUID CULTURE     Status: Normal   Collection Time   06/22/12  3:35 PM      Component Value Range Status Comment   Specimen Description KIDNEY URINE   Final    Special Requests NONE   Final    Gram Stain     Final    Value: MODERATE WBC PRESENT,BOTH PMN AND MONONUCLEAR     NO ORGANISMS SEEN   Culture FEW ESCHERICHIA COLI   Final    Report Status 06/25/2012 FINAL   Final     Organism ID, Bacteria ESCHERICHIA COLI   Final   CULTURE, BLOOD (ROUTINE X 2)     Status: Normal (Preliminary result)   Collection Time   06/22/12  8:15 PM      Component Value Range Status Comment   Specimen Description BLOOD RIGHT ARM   Final    Special Requests BOTTLES DRAWN AEROBIC AND ANAEROBIC 10CC   Final    Culture  Setup Time 06/23/2012 01:34   Final    Culture     Final    Value:        BLOOD CULTURE RECEIVED NO GROWTH TO DATE CULTURE WILL BE HELD FOR 5 DAYS BEFORE ISSUING A FINAL NEGATIVE REPORT   Report Status PENDING   Incomplete   CULTURE, BLOOD (ROUTINE X 2)     Status: Normal (Preliminary result)   Collection Time   06/22/12  8:20 PM  Component Value Range Status Comment   Specimen Description BLOOD RIGHT ARM   Final    Special Requests BOTTLES DRAWN AEROBIC AND ANAEROBIC 10CC   Final    Culture  Setup Time 06/23/2012 01:35   Final    Culture     Final    Value:        BLOOD CULTURE RECEIVED NO GROWTH TO DATE CULTURE WILL BE HELD FOR 5 DAYS BEFORE ISSUING A FINAL NEGATIVE REPORT   Report Status PENDING   Incomplete      Studies:  Ct Abdomen Pelvis Wo Contrast 06/21/2012 IMPRESSION: Compared the prior study, right hydronephrosis has worsened and right perinephric stranding has worsened.  No ureteral calculi are seen.  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  Previously noted small bowel obstruction pattern has resolved.  Infrarenal abdominal aortic aneurysm has increased from 4.4 cm to 5.0 cm.  Exam is otherwise unchanged.  Original Report Authenticated By: Donavan Burnet, M.D.    Dg Abd 1 View 06/25/2012 IMPRESSION: Persistent gaseous distended small bowel loops consistent with ileus or partial small bowel obstruction.  Original Report Authenticated By: Natasha Mead, M.D.    Dg Abd 1 View 06/24/2012 IMPRESSION: Persistent gaseous distended small bowel loops mid abdomen consistent with ileus or partial  small bowel obstruction. NG tube in place.  Original Report Authenticated By: Natasha Mead, M.D.    Dg Abd 1 View 06/23/2012  IMPRESSION:  Marked gaseous distension of predominately the small bowel, though distal colonic gas and stool is identified.  While these findings may suggest ileus, early/partial small bowel obstruction is not excluded.  Continued attention on follow-up is recommended. Note this examination is nondiagnostic for the evaluation of pneumoperitoneum.  If this is of clinical concern, either upright or left lateral decubitus abdominal radiographs are recommended.  This was made a call report.  Original Report Authenticated By: Waynard Reeds, M.D.    Ir Nephrostomy Tube Change 06/26/2012  IMPRESSION:  1.  Antegrade nephrostogram demonstrates complete occlusion of the distal right ureter.  2.  Attempts at crossing the distal ureteral obstruction were unsuccessful.  3.  Successful exchange of 10-French percutaneous nephrostomy tube. This tube will be left to gravity drainage.  If surgical revision is not an option, the patient will require routine tube check and changes every 6 - 8 weeks.  Signed,  Sterling Big, MD Vascular & Interventional Radiologist [facility] Radiology  Original Report Authenticated By: Vilma Prader    Ir Nephrostogram Right 06/26/2012 IMPRESSION:  1.  Antegrade nephrostogram demonstrates complete occlusion of the distal right ureter.  2.  Attempts at crossing the distal ureteral obstruction were unsuccessful.  3.  Successful exchange of 10-French percutaneous nephrostomy tube. This tube will be left to gravity drainage.  If surgical revision is not an option, the patient will require routine tube check and changes every 6 - 8 weeks.  Signed,  Sterling Big, MD Vascular & Interventional Radiologist [facility]iology  Original Report Authenticated By: Vilma Prader    Ir Perc Nephrostomy Right 06/22/2012 Impression:Successful placement of a right percutaneous nephrostomy  tube using ultrasound and fluoroscopic guidance.  Urine sample sent for culture.  Original Report Authenticated By: Richarda Overlie, M.D.    Dg Abd Portable 2v 06/26/2012 IMPRESSION: 1. Overall, the degree of gaseous distension has significantly improved compared to yesterday's examination.  While there continues to be some mild dilatation of small bowel, the overall appearance is one  of a resolving ileus or partial small bowel obstruction. 2.  Support apparatus, as above.  Original Report Authenticated By: Florencia Reasons, M.D.    Scheduled Meds:    . alum & mag hydroxide-simeth  30 mL Oral Daily  . antiseptic oral rinse  15 mL Mouth Rinse BID  . aspirin EC  81 mg Oral Daily  . buPROPion  150 mg Oral BID  . ciprofloxacin  400 mg Intravenous Q12H  . gabapentin  300 mg Oral TID  . guaiFENesin  600 mg Oral BID  . heparin  5,000 Units Subcutaneous Q8H  . metoCLOPramide (REGLAN) injection  5 mg Intravenous Q8H  . multivitamin with minerals  1 tablet Oral Daily  . omega-3 acid ethyl esters  1 g Oral BID  . simvastatin  20 mg Oral QHS   Continuous Infusions:    . sodium chloride 1,000 mL (06/27/12 1353)    Time spent: 45 minutes.   LOS: 7 days   Auset Fritzler  [facility] Pager [phone].  If 8PM-8AM, please contact night-coverage at [email or web address], password [facility] 06/28/2012, 11:23 AM

## 2012-06-29 LAB — CULTURE, BLOOD (ROUTINE X 2): Culture: NO GROWTH

## 2012-06-29 MED ORDER — DIPHENHYDRAMINE HCL 50 MG PO CAPS
50.0000 mg | ORAL_CAPSULE | Freq: Four times a day (QID) | ORAL | Status: DC | PRN
  Administered 2012-06-29 – 2012-06-30 (×2): 50 mg via ORAL
  Filled 2012-06-29 (×2): qty 1

## 2012-06-29 NOTE — Progress Notes (Signed)
Irrigations. Abd soft. Tiny amount of stool on ostomy.

## 2012-06-29 NOTE — Progress Notes (Signed)
[facility] PROGRESS NOTE  Michael Novak UJW:[id number] DOB: 10/09/1930 DOA: 06/21/2012 PCP: Kimber Relic, MD  Assessment/Plan: Principal Problem:  *Pyelonephritis  Placed on empiric Cipro upon admission.   Urine cultures grew Escherichia coli, Cipro sensitive. Switched to PO Cipro 06/28/12. Active Problems:  Dyslipidemia  Continue statin and fish oil.  H/O colostomy / Prostate cancer  Status post radioactive seed implantation complicated by prostatic-rectal fistula.  Status post cystoprostatectomy with ileal conduit as well as proctocolectomy with colostomy.  COPD (chronic obstructive pulmonary disease)  Stable. Resume Spiriva and when necessary Ventolin.  Hydronephrosis, right  Progressive worsening of his right hydronephrosis noted by urologist.  Right cutaneous nephrostomy tube was placed by IR 06/22/2012. A tube exchange along with a right retrograde nephroureterogram was done 06/26/2012. Attempt to internalize the tube with a double-J stent was made on 7/RD/2013 but this could not be done due to what appeared to be a complete obstruction/obliteration of the distal portion of the ureter on the right side just before it enters the conduit. Nephrostomy tube will need to be exchanged every 6-8 weeks.  Followup with Dr. Logan Bores as an outpatient once discharged, will need consideration of revision of his right ureteral-ileal anastomosis.  AAA (abdominal aortic aneurysm)  Followup with vascular surgeon as an outpatient.  Continue blood pressure control.  Ileus  Developed abdominal distention and nausea 06/23/12 prompting a abdominal x-ray.  Findings showed an ileus versus a SBO.  NG tube placed 06/24/12.  Surgical consultation requested and kindly provided by Dr. Johna Sheriff on 06/24/2012. Bowel rest and ongoing NG tube suction recommended along with minimizing narcotic pain medication use.  Serial abdominal films were obtained, which showed gradual improvement.  NG  clamping trials were begun on 06/27/2012. NG tube dislodged 06/27/2012.  No further nausea or vomiting. Tolerating full liquids. No further diet advancement unless stool in ostomy bag, per surgery, who plans to irrigate the ostomy today.  Physical deconditioning  Physical therapy evaluation done on 06/26/2012. Home health physical therapy with 24-hour supervision recommended.  The patient states his wife will be at home to provide 24 hour supervision.  AKI (acute kidney injury)  Secondary to right hydronephrosis. Once nephrostomy tube was placed, patient's creatinine began to improve.   Code Status: Full  Family Communication:  Ankith Edmonston (son) [phone].  Left message for him 06/28/12, no call back yet. Disposition Plan: Home with home health services.    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 He was found to have worsening right hydronephrosis, suspicious for worsening of the ureteral anastomosis with perinephric stranding.  Medical Consultants:  Dr. Glenna Fellows, Surgery  Dr. Ihor Gully, Urology  Dr. Malachy Moan and Dr. Richarda Overlie, IR  Other Consultants:  Physical therapy  Procedures:  Placement of right percutaneous nephrostomy tube 06/22/2012.  Right antegrade nephroureterogram 06/26/12. 10 French nephrostomy tube exchange for new tube.  Antibiotics:  Cipro 06/21/2012--->  HPI/Subjective: Michael Novak reports flatus and his ostomy pouch but no solid stools. No nausea or vomiting since his NG tube dislodged. Feels well otherwise. No dyspnea or cough.  Objective: Filed Vitals:   06/28/12 2158 06/28/12 2215 06/29/12 0637 06/29/12 0834  BP: 148/84   120/74   Pulse: 65  68   Temp: 99.5 F (37.5  C)  99 F (37.2 C)   TempSrc: Oral  Oral   Resp: 18  18   Height:      Weight:      SpO2: 89% 92% 90% 92%    Intake/Output Summary (Last 24 hours) at 06/29/12 1056 Last data filed at 06/29/12 0900  Gross per 24 hour  Intake 1555.83 ml  Output    800 ml  Net 755.83 ml    Exam: Gen:  NAD Cardiovascular:  RRR, No M/R/G Respiratory: Lungs CTAB Gastrointestinal: Abdomen soft, NT/ND with normal active bowel sounds. Flatus in ostomy pouch.  Still no stool. Extremities: No C/E/C  Data Reviewed: Basic Metabolic Panel:  Lab 06/26/12 1610 06/24/12 0420  NA 137 136  K 3.9 4.2  CL 99 99  CO2 27 28  GLUCOSE 99 130*  BUN 27* 23  CREATININE 1.24 1.42*  CALCIUM 9.4 9.1  MG -- --  PHOS -- --   GFR Estimated Creatinine Clearance: 59.7 ml/min (by C-G formula based on Cr of 1.24). Liver Function Tests: No results found for this basename: AST:5,ALT:5,ALKPHOS:5,BILITOT:5,PROT:5,ALBUMIN:5 in the last 168 hours No results found for this basename: LIPASE:5,AMYLASE:5 in the last 168 hours CBC:  Lab 06/26/12 0340 06/24/12 0420  WBC 4.4 7.3  NEUTROABS -- --  HGB 13.6 13.2  HCT 39.7 39.0  MCV 90.2 90.5  PLT 193 156   Microbiology Recent Results (from the past 240 hour(s))  URINE CULTURE     Status: Normal   Collection Time   06/21/12  6:57 PM      Component Value Range Status Comment   Specimen Description URINE, CLEAN CATCH   Final    Special Requests NONE   Final    Culture  Setup Time 06/22/2012 03:21   Final    Colony Count NO GROWTH   Final    Culture NO GROWTH   Final    Report Status 06/22/2012 FINAL   Final   BODY FLUID CULTURE     Status: Normal   Collection Time   06/22/12  3:35 PM      Component Value Range Status Comment   Specimen Description KIDNEY URINE   Final    Special Requests NONE   Final    Gram Stain     Final    Value: MODERATE WBC PRESENT,BOTH PMN AND MONONUCLEAR     NO ORGANISMS SEEN   Culture FEW  ESCHERICHIA COLI   Final    Report Status 06/25/2012 FINAL   Final    Organism ID, Bacteria ESCHERICHIA COLI   Final   CULTURE, BLOOD (ROUTINE X 2)     Status: Normal   Collection Time   06/22/12  8:15 PM      Component Value Range Status Comment   Specimen Description BLOOD RIGHT ARM   Final    Special Requests BOTTLES DRAWN AEROBIC AND ANAEROBIC 10CC   Final    Culture  Setup Time 06/23/2012 01:34   Final    Culture NO GROWTH 5 DAYS   Final    Report Status 06/29/2012 FINAL   Final   CULTURE, BLOOD (ROUTINE X 2)     Status: Normal   Collection Time   06/22/12  8:20 PM      Component Value Range Status Comment   Specimen Description BLOOD RIGHT ARM   Final    Special Requests BOTTLES DRAWN AEROBIC AND ANAEROBIC 10CC   Final    Culture  Setup Time 06/23/2012 01:35   Final  Culture NO GROWTH 5 DAYS   Final    Report Status 06/29/2012 FINAL   Final      Studies:  Ct Abdomen Pelvis Wo Contrast 06/21/2012 IMPRESSION: Compared the prior study, right hydronephrosis has worsened and right perinephric stranding has worsened.  No ureteral calculi are seen.  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  Previously noted small bowel obstruction pattern has resolved.  Infrarenal abdominal aortic aneurysm has increased from 4.4 cm to 5.0 cm.  Exam is otherwise unchanged.  Original Report Authenticated By: Donavan Burnet, M.D.    Dg Abd 1 View 06/25/2012 IMPRESSION: Persistent gaseous distended small bowel loops consistent with ileus or partial small bowel obstruction.  Original Report Authenticated By: Natasha Mead, M.D.    Dg Abd 1 View 06/24/2012 IMPRESSION: Persistent gaseous distended small bowel loops mid abdomen consistent with ileus or partial small bowel obstruction. NG tube in place.  Original Report Authenticated By: Natasha Mead, M.D.    Dg Abd 1 View 06/23/2012  IMPRESSION:  Marked gaseous distension of predominately the small  bowel, though distal colonic gas and stool is identified.  While these findings may suggest ileus, early/partial small bowel obstruction is not excluded.  Continued attention on follow-up is recommended. Note this examination is nondiagnostic for the evaluation of pneumoperitoneum.  If this is of clinical concern, either upright or left lateral decubitus abdominal radiographs are recommended.  This was made a call report.  Original Report Authenticated By: Waynard Reeds, M.D.    Ir Nephrostomy Tube Change 06/26/2012  IMPRESSION:  1.  Antegrade nephrostogram demonstrates complete occlusion of the distal right ureter.  2.  Attempts at crossing the distal ureteral obstruction were unsuccessful.  3.  Successful exchange of 10-French percutaneous nephrostomy tube. This tube will be left to gravity drainage.  If surgical revision is not an option, the patient will require routine tube check and changes every 6 - 8 weeks.  Signed,  Sterling Big, MD Vascular & Interventional Radiologist [facility]  Original Report Authenticated By: Vilma Prader    Ir Nephrostogram Right 06/26/2012 IMPRESSION:  1.  Antegrade nephrostogram demonstrates complete occlusion of the distal right ureter.  2.  Attempts at crossing the distal ureteral obstruction were unsuccessful.  3.  Successful exchange of 10-French percutaneous nephrostomy tube. This tube will be left to gravity drainage.  If surgical revision is not an option, the patient will require routine tube check and changes every 6 - 8 weeks.  Signed,  Sterling Big, MD Vascular & Interventional Radiologist [facility]  Original Report Authenticated By: Vilma Prader    Ir Perc Nephrostomy Right 06/22/2012 Impression:Successful placement of a right percutaneous nephrostomy tube using ultrasound and fluoroscopic guidance.  Urine sample sent for culture.  Original Report Authenticated By: Richarda Overlie, M.D.    Dg Abd Portable 2v 06/26/2012 IMPRESSION: 1. Overall,  the degree of gaseous distension has significantly improved compared to yesterday's examination.  While there continues to be some mild dilatation of small bowel, the overall appearance is one of a resolving ileus or partial small bowel obstruction. 2.  Support apparatus, as above.  Original Report Authenticated By: Florencia Reasons, M.D.    Scheduled Meds:    . alum & mag hydroxide-simeth  30 mL Oral Daily  . antiseptic oral rinse  15 mL Mouth Rinse BID  . aspirin EC  81 mg Oral Daily  . bisacodyl  10 mg Oral Daily  .  buPROPion  150 mg Oral BID  . ciprofloxacin  500 mg Oral BID  . gabapentin  300 mg Oral TID  . guaiFENesin  600 mg Oral BID  . heparin  5,000 Units Subcutaneous Q8H  . metoCLOPramide  5 mg Oral TID AC & HS  . multivitamin with minerals  1 tablet Oral Daily  . omega-3 acid ethyl esters  1 g Oral BID  . simvastatin  20 mg Oral QHS  . tiotropium  18 mcg Inhalation Daily  . DISCONTD: ciprofloxacin  400 mg Intravenous Q12H  . DISCONTD: metoCLOPramide (REGLAN) injection  5 mg Intravenous Q8H   Continuous Infusions:    . sodium chloride 1,000 mL (06/27/12 1353)    Time spent: 35 minutes.   LOS: 8 days   RAMA,CHRISTINA  [facility] Pager [phone].  If 8PM-8AM, please contact night-coverage at [email or web address], password [facility] 06/29/2012, 10:56 AM

## 2012-06-29 NOTE — Progress Notes (Signed)
  Subjective: Still nothing thru the ostomy, he has a hx of constipation and had an irrigation of it in the past when he was having trouble.  Objective: Vital signs in last 24 hours: Temp:  [99 F (37.2 C)-99.5 F (37.5 C)] 99 F (37.2 C) (08/01 0637) Pulse Rate:  [65-72] 68  (08/01 0637) Resp:  [18] 18  (08/01 0637) BP: (120-148)/(72-84) 120/74 mmHg (08/01 0637) SpO2:  [88 %-92 %] 92 % (08/01 0834) Last BM Date: 06/21/12  240 po recorded, nothing from the ostomy recorded, TM 99.2, No labs, Diet: full liquids, he did get dulcolax tabs, yesterday.  Intake/Output from previous day: 07/31 0701 - 08/01 0700 In: 1435.8 [P.O.:240; I.V.:1190.8] Out: 800 [Urine:800] Intake/Output this shift: Total I/O In: 240 [P.O.:240] Out: -   General appearance: alert, cooperative and no distress GI: up in chair few bowel sounds, some gas, not much, no stool  Lab Results:  No results found for this basename: WBC:2,HGB:2,HCT:2,PLT:2 in the last 72 hours  BMET No results found for this basename: NA:2,K:2,CL:2,CO2:2,GLUCOSE:2,BUN:2,CREATININE:2,CALCIUM:2 in the last 72 hours PT/INR No results found for this basename: LABPROT:2,INR:2 in the last 72 hours  No results found for this basename: AST:5,ALT:5,ALKPHOS:5,BILITOT:5,PROT:5,ALBUMIN:5 in the last 168 hours   Lipase     Component Value Date/Time   LIPASE 23 06/21/2012 1630     Studies/Results: No results found.  Medications:    . alum & mag hydroxide-simeth  30 mL Oral Daily  . antiseptic oral rinse  15 mL Mouth Rinse BID  . aspirin EC  81 mg Oral Daily  . bisacodyl  10 mg Oral Daily  . buPROPion  150 mg Oral BID  . ciprofloxacin  500 mg Oral BID  . gabapentin  300 mg Oral TID  . guaiFENesin  600 mg Oral BID  . heparin  5,000 Units Subcutaneous Q8H  . metoCLOPramide  5 mg Oral TID AC & HS  . multivitamin with minerals  1 tablet Oral Daily  . omega-3 acid ethyl esters  1 g Oral BID  . simvastatin  20 mg Oral QHS  . tiotropium   18 mcg Inhalation Daily  . DISCONTD: ciprofloxacin  400 mg Intravenous Q12H  . DISCONTD: metoCLOPramide (REGLAN) injection  5 mg Intravenous Q8H    Assessment/Plan Ileus vs SBO  Worsening hydronephrosis- s/p placement of a R nephrostomy tube with a double J stent placement i  COPD-  AAA- follow up with vascular surgery as an outpatient  Prostate Ca  Plan:  Irrigate the ostomy and see how he does, I would not advance diet any further till we have stool in ostomy.     LOS: 8 days    Michael Novak 06/29/2012

## 2012-06-29 NOTE — Progress Notes (Signed)
Physical Therapy Treatment Patient Details Name: Michael Novak MRN: [id number] DOB: Jun 29, 1930 Today's Date: 06/29/2012 Time: 1350-1406 PT Time Calculation (min): 16 min  PT Assessment / Plan / Recommendation Comments on Treatment Session  pt conitnues to improve with mobility and gait.  Pt reports some pain in right great toe that he fears may be gout.  RN informed    Follow Up Recommendations  Home health PT;Supervision/Assistance - 24 hour    Barriers to Discharge        Equipment Recommendations  None recommended by PT    Recommendations for Other Services OT consult  Frequency Min 3X/week   Plan Discharge plan remains appropriate    Precautions / Restrictions Precautions Precautions: Fall Precaution Comments: Multiple drains, lines.  Restrictions Weight Bearing Restrictions: No   Pertinent Vitals/Pain Pt with some c/o pain in right great toe..he has had this before in previous admissions    Mobility  Bed Mobility Bed Mobility: Supine to Sit Sit to Supine: 5: Supervision Transfers Transfers: Stand to Sit;Sit to Stand Sit to Stand: 5: Supervision Stand to Sit: 5: Supervision Details for Transfer Assistance: less frquent VC's to push up with arms pt repeated sit to stand ~ 5 times for strengthening and technique Ambulation/Gait Ambulation/Gait Assistance: 5: Supervision Ambulation Distance (Feet): 200 Feet Assistive device: Rolling walker Ambulation/Gait Assistance Details: rare cues for posture during gait  Gait Pattern: Step-through pattern;Decreased step length - right;Decreased step length - left Gait velocity: decreased General Gait Details: improved posture today with better stepping with each leg Stairs: No Wheelchair Mobility Wheelchair Mobility: No    Exercises     PT Diagnosis:    PT Problem List:   PT Treatment Interventions:     PT Goals Acute Rehab PT Goals Pt will go Supine/Side to Sit: Independently PT Goal: Supine/Side to Sit - Progress:  Updated due to goal met Pt will go Sit to Supine/Side: Independently PT Goal: Sit to Supine/Side - Progress: Updated due to goal met Pt will go Sit to Stand: with modified independence PT Goal: Sit to Stand - Progress: Updated due to goal met Pt will Ambulate: with modified independence PT Goal: Ambulate - Progress: Updated due to goal met  Visit Information  Last PT Received On: 06/29/12 Assistance Needed: +1    Subjective Data  Subjective: pt reports he's beginning to have pain in Right great toe..."gout" and this has happened the past 2 times he was here Patient Stated Goal: to go home   Cognition  Overall Cognitive Status: Appears within functional limits for tasks assessed/performed Arousal/Alertness: Awake/alert Orientation Level: Appears intact for tasks assessed Behavior During Session: [facility] for tasks performed    Balance  Balance Balance Assessed: No  End of Session PT - End of Session Activity Tolerance: Patient tolerated treatment well Patient left: with call bell/phone within reach;with family/visitor present;in chair   GP     Rosey Bath K. Manson Passey [phone] 06/29/2012, 2:19 PM

## 2012-06-29 NOTE — Consult Note (Addendum)
WOC ostomy consult  Stoma type/location: LLQ colostomy with hernia. Order to assist RN with instilling of tap water into ostomy (colostomy irrigation) Stomal assessment/size: 1 and 1/2 inch slightly prolapsed stoma; dry mucosa Peristomal assessment: intact,clear Treatment options for stomal/peristomal skin: None indicated Output None Ostomy pouching: 1pc. Pouching system (patient's from home)  Education provided: patient reminded of principle behind colostomy irrigation.  Patient's wife and son present for treatment.  instilled into stoma with no (immediate) results.  In the past and per Dr. Idamae Lusher (his [facility] surgeon's (now retired)) request, I have irrigated Michael Novak for several consecutive days before achieving results.  I will be happy to repeat irrigation in the morning should you desire. Supplies are left in the room. Please order, if so desired. I will not follow, but will remain available to this patient and his medical/surgical team.  Please re-consult if needed. Thanks, Michael Mow, MSN, RN, [facility], [facility]OCN [phone])

## 2012-06-30 MED ORDER — SORBITOL 70 % SOLN
30.0000 mL | Freq: Every day | Status: DC
  Administered 2012-06-30 – 2012-07-02 (×3): 30 mL via ORAL
  Filled 2012-06-30 (×3): qty 30

## 2012-06-30 MED ORDER — ENSURE COMPLETE PO LIQD
237.0000 mL | Freq: Two times a day (BID) | ORAL | Status: DC
  Administered 2012-06-30 – 2012-07-02 (×4): 237 mL via ORAL

## 2012-06-30 NOTE — Progress Notes (Signed)
Agree with above.   Pt just got irrigation.   Will see if results.

## 2012-06-30 NOTE — Progress Notes (Signed)
[facility] PROGRESS NOTE  Michael Novak ZOX:[id number] DOB: [birth year] DOA: 06/21/2012 PCP: Michael Relic, MD  Assessment/Plan: Principal Problem:  *Pyelonephritis  Placed on empiric Cipro upon admission.   Urine cultures grew Escherichia coli, Cipro sensitive. Switched to PO Cipro 06/28/12. Active Problems:  Dyslipidemia  Continue statin and fish oil.  H/O colostomy / Prostate cancer  Status post radioactive seed implantation complicated by prostatic-rectal fistula.  Status post cystoprostatectomy with ileal conduit as well as proctocolectomy with colostomy.  COPD (chronic obstructive pulmonary disease)  Stable. Resume Spiriva and when necessary Ventolin.  Hydronephrosis, right  Progressive worsening of his right hydronephrosis noted by urologist.  Right cutaneous nephrostomy tube was placed by IR 06/22/2012. A tube exchange along with a right retrograde nephroureterogram was done 06/26/2012. Attempt to internalize the tube with a double-J stent was made on 7/RD/2013 but this could not be done due to what appeared to be a complete obstruction/obliteration of the distal portion of the ureter on the right side just before it enters the conduit. Nephrostomy tube will need to be exchanged every 6-8 weeks.  Followup with Dr. Logan Novak as an outpatient once discharged, will need consideration of revision of his right ureteral-ileal anastomosis.  AAA (abdominal aortic aneurysm)  Followup with vascular surgeon as an outpatient.  Continue blood pressure control.  Ileus  Developed abdominal distention and nausea 06/23/12 prompting a abdominal x-ray.  Findings showed an ileus versus a SBO.  NG tube placed 06/24/12.  Surgical consultation requested and kindly provided by Dr. Johna Novak on 06/24/2012. Bowel rest and ongoing NG tube suction recommended along with minimizing narcotic pain medication use.  Serial abdominal films were obtained, which showed gradual improvement.  NG  clamping trials were begun on 06/27/2012. NG tube dislodged 06/27/2012.  No further nausea or vomiting. Tolerating full liquids. No further diet advancement unless stool in ostomy bag, per surgery.  Continue ostomy irrigation until bowels moving.  Will add Sorbitol.  Consider Reglan if no BM in next 24 hours.  Physical deconditioning  Physical therapy evaluation done on 06/26/2012. Home health physical therapy with 24-hour supervision recommended.  The patient states his wife will be at home to provide 24 hour supervision.  AKI (acute kidney injury)  Secondary to right hydronephrosis. Once nephrostomy tube was placed, patient's creatinine began to improve.   Code Status: Full  Family Communication:  Michael Novak (son) [phone].  Wife updated at bedside. Disposition Plan: Home with home health services.    Brief narrative: 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 He was found to have worsening right hydronephrosis, suspicious for worsening of the ureteral anastomosis with perinephric stranding.  Medical Consultants:  Dr. Glenna Novak, Surgery  Dr. Ihor Novak, Urology  Dr. Malachy Novak and Dr. Richarda Novak, IR  Other Consultants:  Physical therapy  Procedures:  Placement of right percutaneous nephrostomy tube 06/22/2012.  Right antegrade nephroureterogram 06/26/12. 10 French nephrostomy tube exchange for new tube.  Antibiotics:  Cipro 06/21/2012--->  HPI/Subjective: [name] still has not had any stools in his ostomy pouch.  He does not feel hungry.  Denies N/V.  No significant pain.  Objective: Filed Vitals:   06/29/12 1328 06/29/12 2145 06/30/12 0551 06/30/12 0804    BP: 120/64 134/73 138/82   Pulse: 74 60  62   Temp: 98.5 F (36.9 C) 99.1 F (37.3 C) 98.1 F (36.7 C)   TempSrc:  Oral Oral   Resp: 16 18 16    Height:      Weight:      SpO2: 93% 92% 93% 92%    Intake/Output Summary (Last 24 hours) at 06/30/12 1421 Last data filed at 06/30/12 2956  Gross per 24 hour  Intake    245 ml  Output   1350 ml  Net  -1105 ml    Exam: Gen:  NAD Cardiovascular:  RRR, No M/R/G Respiratory: Lungs CTAB Gastrointestinal: Abdomen softly distended with normal active bowel sounds. Flatus in ostomy pouch.  Still no stool. Extremities: No C/E/C  Data Reviewed: Basic Metabolic Panel:  Lab 06/26/12 2130 06/24/12 0420  NA 137 136  K 3.9 4.2  CL 99 99  CO2 27 28  GLUCOSE 99 130*  BUN 27* 23  CREATININE 1.24 1.42*  CALCIUM 9.4 9.1  MG -- --  PHOS -- --   GFR Estimated Creatinine Clearance: 59.7 ml/min (by C-G formula based on Cr of 1.24). Liver Function Tests: No results found for this basename: AST:5,ALT:5,ALKPHOS:5,BILITOT:5,PROT:5,ALBUMIN:5 in the last 168 hours No results found for this basename: LIPASE:5,AMYLASE:5 in the last 168 hours CBC:  Lab 06/26/12 0340 06/24/12 0420  WBC 4.4 7.3  NEUTROABS -- --  HGB 13.6 13.2  HCT 39.7 39.0  MCV 90.2 90.5  PLT 193 156   Microbiology Recent Results (from the past 240 hour(s))  URINE CULTURE     Status: Normal   Collection Time   06/21/12  6:57 PM      Component Value Range Status Comment   Specimen Description URINE, CLEAN CATCH   Final    Special Requests NONE   Final    Culture  Setup Time 06/22/2012 03:21   Final    Colony Count NO GROWTH   Final    Culture NO GROWTH   Final    Report Status 06/22/2012 FINAL   Final   BODY FLUID CULTURE     Status: Normal   Collection Time   06/22/12  3:35 PM      Component Value Range Status Comment   Specimen Description KIDNEY URINE   Final    Special Requests NONE   Final    Gram Stain     Final    Value: MODERATE WBC PRESENT,BOTH PMN AND  MONONUCLEAR     NO ORGANISMS SEEN   Culture FEW ESCHERICHIA COLI   Final    Report Status 06/25/2012 FINAL   Final    Organism ID, Bacteria ESCHERICHIA COLI   Final   CULTURE, BLOOD (ROUTINE X 2)     Status: Normal   Collection Time   06/22/12  8:15 PM      Component Value Range Status Comment   Specimen Description BLOOD RIGHT ARM   Final    Special Requests BOTTLES DRAWN AEROBIC AND ANAEROBIC 10CC   Final    Culture  Setup Time 06/23/2012 01:34   Final    Culture NO GROWTH 5 DAYS   Final    Report Status 06/29/2012 FINAL   Final   CULTURE, BLOOD (ROUTINE X 2)     Status: Normal   Collection Time   06/22/12  8:20 PM      Component Value Range Status Comment   Specimen Description BLOOD RIGHT ARM   Final    Special Requests BOTTLES DRAWN AEROBIC AND ANAEROBIC 10CC  Final    Culture  Setup Time 06/23/2012 01:35   Final    Culture NO GROWTH 5 DAYS   Final    Report Status 06/29/2012 FINAL   Final      Studies:  Ct Abdomen Pelvis Wo Contrast 06/21/2012 IMPRESSION: Compared the prior study, right hydronephrosis has worsened and right perinephric stranding has worsened.  No ureteral calculi are seen.  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  Previously noted small bowel obstruction pattern has resolved.  Infrarenal abdominal aortic aneurysm has increased from 4.4 cm to 5.0 cm.  Exam is otherwise unchanged.  Original Report Authenticated By: Donavan Burnet, M.D.    Dg Abd 1 View 06/25/2012 IMPRESSION: Persistent gaseous distended small bowel loops consistent with ileus or partial small bowel obstruction.  Original Report Authenticated By: Natasha Mead, M.D.    Dg Abd 1 View 06/24/2012 IMPRESSION: Persistent gaseous distended small bowel loops mid abdomen consistent with ileus or partial small bowel obstruction. NG tube in place.  Original Report Authenticated By: Natasha Mead, M.D.    Dg Abd 1 View 06/23/2012  IMPRESSION:  Marked  gaseous distension of predominately the small bowel, though distal colonic gas and stool is identified.  While these findings may suggest ileus, early/partial small bowel obstruction is not excluded.  Continued attention on follow-up is recommended. Note this examination is nondiagnostic for the evaluation of pneumoperitoneum.  If this is of clinical concern, either upright or left lateral decubitus abdominal radiographs are recommended.  This was made a call report.  Original Report Authenticated By: Waynard Reeds, M.D.    Ir Nephrostomy Tube Change 06/26/2012  IMPRESSION:  1.  Antegrade nephrostogram demonstrates complete occlusion of the distal right ureter.  2.  Attempts at crossing the distal ureteral obstruction were unsuccessful.  3.  Successful exchange of 10-French percutaneous nephrostomy tube. This tube will be left to gravity drainage.  If surgical revision is not an option, the patient will require routine tube check and changes every 6 - 8 weeks.  Signed,  Sterling Big, MD Vascular & Interventional Radiologist [facility] Radiology  Original Report Authenticated By: Vilma Prader    Ir Nephrostogram Right 06/26/2012 IMPRESSION:  1.  Antegrade nephrostogram demonstrates complete occlusion of the distal right ureter.  2.  Attempts at crossing the distal ureteral obstruction were unsuccessful.  3.  Successful exchange of 10-French percutaneous nephrostomy tube. This tube will be left to gravity drainage.  If surgical revision is not an option, the patient will require routine tube check and changes every 6 - 8 weeks.  Signed,  Sterling Big, MD Vascular & Interventional Radiologist [facility] Radiology  Original Report Authenticated By: Vilma Prader    Ir Perc Nephrostomy Right 06/22/2012 Impression:Successful placement of a right percutaneous nephrostomy tube using ultrasound and fluoroscopic guidance.  Urine sample sent for culture.  Original Report Authenticated By: Michael Novak, M.D.    Dg Abd  Portable 2v 06/26/2012 IMPRESSION: 1. Overall, the degree of gaseous distension has significantly improved compared to yesterday's examination.  While there continues to be some mild dilatation of small bowel, the overall appearance is one of a resolving ileus or partial small bowel obstruction. 2.  Support apparatus, as above.  Original Report Authenticated By: Florencia Reasons, M.D.    Scheduled Meds:    . alum & mag hydroxide-simeth  30 mL Oral Daily  . antiseptic oral rinse  15 mL Mouth Rinse BID  .  aspirin EC  81 mg Oral Daily  . bisacodyl  10 mg Oral Daily  . buPROPion  150 mg Oral BID  . ciprofloxacin  500 mg Oral BID  . feeding supplement  237 mL Oral BID BM  . gabapentin  300 mg Oral TID  . guaiFENesin  600 mg Oral BID  . heparin  5,000 Units Subcutaneous Q8H  . metoCLOPramide  5 mg Oral TID AC & HS  . multivitamin with minerals  1 tablet Oral Daily  . omega-3 acid ethyl esters  1 g Oral BID  . simvastatin  20 mg Oral QHS  . sorbitol  30 mL Oral Daily  . tiotropium  18 mcg Inhalation Daily   Continuous Infusions:    . sodium chloride 1,000 mL (06/27/12 1353)    Time spent: 35 minutes.   LOS: 9 days   RAMA,CHRISTINA  [facility] Pager [phone].  If 8PM-8AM, please contact night-coverage at [email or web address], password [facility] 06/30/2012, 2:21 PM

## 2012-06-30 NOTE — Consult Note (Signed)
Follow up note: Patient's colostomy irrigated with of warm tap water.  New pouch applied and we are awaiting results.  I will plan on checking with patient in the am and will come to irrigate if no significant results overnight. Dr. Donell Beers in to see patient following irrigation and agrees with plan. Thanks, Ladona Mow, MSN, RN, [facility], CWOCN [phone])

## 2012-06-30 NOTE — Progress Notes (Signed)
Physical Therapy Treatment Patient Details Name: Michael Novak MRN: [id number] DOB: 1930-06-27 Today's Date: 06/30/2012 Time: 7829-5621 PT Time Calculation (min): 14 min  PT Assessment / Plan / Recommendation Comments on Treatment Session  improving in gait speed.  Still need work to improve posture and hip extension during gait    Follow Up Recommendations  Home health PT;Supervision/Assistance - 24 hour    Barriers to Discharge        Equipment Recommendations  None recommended by PT    Recommendations for Other Services OT consult  Frequency Min 3X/week   Plan Discharge plan remains appropriate    Precautions / Restrictions Precautions Precautions: Fall Precaution Comments: Multiple drains, lines.  Restrictions Weight Bearing Restrictions: No   Pertinent Vitals/Pain Right big toe is 'a little sore'    Mobility  Bed Mobility Details for Bed Mobility Assistance: pt sitting up in chair Transfers Transfers: Stand to Sit;Sit to Stand Sit to Stand: 5: Supervision Stand to Sit: 5: Supervision Details for Transfer Assistance: needs no verbal cues Ambulation/Gait Ambulation/Gait Assistance: 5: Supervision Assistive device: Rolling walker Gait Pattern: Step-through pattern;Decreased step length - right;Decreased step length - left Gait velocity: worked on increasing velocity today General Gait Details: worked on Cytogeneticist, speeds and head turns during gait today.   Stairs: No Corporate treasurer: No    Exercises Other Exercises Other Exercises: incentive spirometry Other Exercises: bilateral hip to hip exercises in standing for balance and core activation   PT Diagnosis:    PT Problem List:   PT Treatment Interventions:     PT Goals Acute Rehab PT Goals Pt will go Supine/Side to Sit: Independently Pt will go Sit to Supine/Side: Independently Pt will go Sit to Stand: with modified independence PT Goal: Sit to Stand - Progress:  Progressing toward goal Pt will Ambulate: with modified independence PT Goal: Ambulate - Progress: Progressing toward goal  Visit Information  Last PT Received On: 06/30/12 Assistance Needed: +1    Subjective Data  Subjective: "HI!" Patient Stated Goal: to go home   Cognition  Overall Cognitive Status: Appears within functional limits for tasks assessed/performed Arousal/Alertness: Awake/alert Orientation Level: Appears intact for tasks assessed Behavior During Session: [facility] for tasks performed    Balance  Balance Balance Assessed: No  End of Session PT - End of Session Activity Tolerance: Patient tolerated treatment well Patient left: with call bell/phone within reach;with family/visitor present;in chair   GP    Rosey Bath K. [place], [place] [phone] 06/30/2012, 4:20 PM

## 2012-06-30 NOTE — Progress Notes (Signed)
  Subjective: Up in chair, said Jacki Cones had not been back yet.  Some liquid in bag and it's brown.  Objective: Vital signs in last 24 hours: Temp:  [98.1 F (36.7 C)-99.1 F (37.3 C)] 98.1 F (36.7 C) (08/02 0551) Pulse Rate:  [60-74] 62  (08/02 0551) Resp:  [16-18] 16  (08/02 0551) BP: (120-138)/(64-82) 138/82 mmHg (08/02 0551) SpO2:  [92 %-93 %] 92 % (08/02 0804) Last BM Date: 06/21/12  Nothing recorded from stoma, 600 ml PO, diet is full liquids, tm 99.1, no labs  Intake/Output from previous day: 08/01 0701 - 08/02 0700 In: 605 [P.O.:600] Out: 1075 [Urine:1075] Intake/Output this shift: Total I/O In: 240 [P.O.:240] Out: 525 [Urine:525]  General appearance: alert, cooperative and no distress GI: soft, non-tender; bowel sounds normal; no masses,  no organomegaly and hernia at ostomy, with not much gas, and a little brown liquid in the bag.  Lab Results:  No results found for this basename: WBC:2,HGB:2,HCT:2,PLT:2 in the last 72 hours  BMET No results found for this basename: NA:2,K:2,CL:2,CO2:2,GLUCOSE:2,BUN:2,CREATININE:2,CALCIUM:2 in the last 72 hours PT/INR No results found for this basename: LABPROT:2,INR:2 in the last 72 hours  No results found for this basename: AST:5,ALT:5,ALKPHOS:5,BILITOT:5,PROT:5,ALBUMIN:5 in the last 168 hours   Lipase     Component Value Date/Time   LIPASE 23 06/21/2012 1630     Studies/Results: No results found.  Medications:    . alum & mag hydroxide-simeth  30 mL Oral Daily  . antiseptic oral rinse  15 mL Mouth Rinse BID  . aspirin EC  81 mg Oral Daily  . bisacodyl  10 mg Oral Daily  . buPROPion  150 mg Oral BID  . ciprofloxacin  500 mg Oral BID  . gabapentin  300 mg Oral TID  . guaiFENesin  600 mg Oral BID  . heparin  5,000 Units Subcutaneous Q8H  . metoCLOPramide  5 mg Oral TID AC & HS  . multivitamin with minerals  1 tablet Oral Daily  . omega-3 acid ethyl esters  1 g Oral BID  . simvastatin  20 mg Oral QHS  .  tiotropium  18 mcg Inhalation Daily    Assessment/Plan Ileus vs SBO  Worsening hydronephrosis- s/p placement of a R nephrostomy tube with a double J stent placement i  COPD-  AAA- follow up with vascular surgery as an outpatient  Prostate Ca   Plan:  Irrigate ostomy again today. He has had this issue before and it has taken more than one irrigation to resume function after fecal impaction.  LOS: 9 days    Manar Smalling 06/30/2012

## 2012-06-30 NOTE — Progress Notes (Signed)
  Subjective: Pt doing fairly well; little output from ostomy; no sig rt flank pain; nephrostomy functioning ok  Objective: Vital signs in last 24 hours: Temp:  [98.1 F (36.7 C)-99.1 F (37.3 C)] 98.1 F (36.7 C) (08/02 0551) Pulse Rate:  [60-74] 62  (08/02 0551) Resp:  [16-18] 16  (08/02 0551) BP: (120-138)/(64-82) 138/82 mmHg (08/02 0551) SpO2:  [92 %-93 %] 92 % (08/02 0804) Last BM Date: 06/21/12  Intake/Output from previous day: 08/01 0701 - 08/02 0700 In: 605 [P.O.:600] Out: 1075 [Urine:1075] Intake/Output this shift: Total I/O In: 240 [P.O.:240] Out: 525 [Urine:525]  Right PCN intact, insertion site with mild erythema, output 200 cc's yellow urine today, no leaking noted  Lab Results:  No results found for this basename: WBC:2,HGB:2,HCT:2,PLT:2 in the last 72 hours BMET No results found for this basename: NA:2,K:2,CL:2,CO2:2,GLUCOSE:2,BUN:2,CREATININE:2,CALCIUM:2 in the last 72 hours PT/INR No results found for this basename: LABPROT:2,INR:2 in the last 72 hours ABG No results found for this basename: PHART:2,PCO2:2,PO2:2,HCO3:2 in the last 72 hours  Studies/Results: No results found.  Anti-infectives: Anti-infectives     Start     Dose/Rate Route Frequency Ordered Stop   06/28/12 2000   ciprofloxacin (CIPRO) tablet 500 mg        500 mg Oral 2 times daily 06/28/12 1402     06/22/12 0800   ciprofloxacin (CIPRO) IVPB 400 mg  Status:  Discontinued        400 mg 200 mL/hr over 60 Minutes Intravenous Every 12 hours 06/21/12 2159 06/28/12 1402   06/21/12 1945   ciprofloxacin (CIPRO) IVPB 400 mg        400 mg 200 mL/hr over 60 Minutes Intravenous  Once 06/21/12 1936 06/21/12 2048          Assessment/Plan: s/p right PCN 7/25, exchanged after failed ureteral stent attempt 7/29; cont tube with exchanges every 6-8 weeks; OP f/u with Dr. Logan Bores   LOS: 9 days    ALLRED,D [facility] 06/30/2012

## 2012-06-30 NOTE — Progress Notes (Signed)
INITIAL ADULT NUTRITION ASSESSMENT Date: 06/30/2012   Time: 1:25 PM Reason for Assessment: Poor intake  INTERVENTION: Ensure Complete BID. Will monitor for diet advancement and intake.   ASSESSMENT: Male 78 y.o.  Dx: Pyelonephritis  Food/Nutrition Related Hx: Pt admitted with right flank pain and has colostomy and urostomy. Pt reports eating well PTA, 3 meals/day with great appetite and no changes in weight. Pt reports when his colostomy was functioning he was emptying it 2-3 times per day. Pt reports he has not been eating well during admission because he does not like being on a full liquid diet. Pt interested in getting Ensure. Pt had right percutaneous nephrostomy placed on 7/25 with exchange on 7/29. Pt had NGT placed on 7/27 r/t nausea, vomiting, and distended abdomen and came out on 7/30. Pt had 2 days of 0 colostomy output and was thought to have an ileus versus SBO. No colostomy output so far today. Pt states MD says once the ileus/SBO resolves his diet can be advanced to solid foods.   Hx:  Past Medical History  Diagnosis Date  . History of colostomy   . Urostomy stenosis     Ask patient to clarify, per history form dated 04/02/11.  Marland Kitchen Peristomal hernia   . Prostate cancer   . Skin cancer   . Neuropathy     in feet   . COPD (chronic obstructive pulmonary disease)   . Depression   . UTI (lower urinary tract infection)     history    Related Meds:  Scheduled Meds:   . alum & mag hydroxide-simeth  30 mL Oral Daily  . antiseptic oral rinse  15 mL Mouth Rinse BID  . aspirin EC  81 mg Oral Daily  . bisacodyl  10 mg Oral Daily  . buPROPion  150 mg Oral BID  . ciprofloxacin  500 mg Oral BID  . gabapentin  300 mg Oral TID  . guaiFENesin  600 mg Oral BID  . heparin  5,000 Units Subcutaneous Q8H  . metoCLOPramide  5 mg Oral TID AC & HS  . multivitamin with minerals  1 tablet Oral Daily  . omega-3 acid ethyl esters  1 g Oral BID  . simvastatin  20 mg Oral QHS  . tiotropium  18  mcg Inhalation Daily   Continuous Infusions:   . sodium chloride 1,000 mL (06/27/12 1353)   PRN Meds:.acetaminophen, albuterol, diphenhydrAMINE, ondansetron (ZOFRAN) IV, ondansetron, oxyCODONE-acetaminophen, phenol, phenol-menthol, promethazine  Ht: 6\' 2"  (188 cm)  Wt: 218 lb 0.6 oz (98.9 kg)  Ideal Wt: 190 lb % Ideal Wt: 114  Usual Wt: 218 lb % Usual Wt: 100  Body mass index is 27.99 kg/(m^2)..   Labs: CMP     Component Value Date/Time   NA 137 06/26/2012 0340   K 3.9 06/26/2012 0340   CL 99 06/26/2012 0340   CO2 27 06/26/2012 0340   GLUCOSE 99 06/26/2012 0340   BUN 27* 06/26/2012 0340   CREATININE 1.24 06/26/2012 0340   CALCIUM 9.4 06/26/2012 0340   PROT 8.0 06/21/2012 1630   ALBUMIN 4.4 06/21/2012 1630   AST 20 06/21/2012 1630   ALT 16 06/21/2012 1630   ALKPHOS 51 06/21/2012 1630   BILITOT 0.6 06/21/2012 1630   GFRNONAA 53* 06/26/2012 0340   GFRAA 62* 06/26/2012 0340    Intake/Output Summary (Last 24 hours) at 06/30/12 1352 Last data filed at 06/30/12 8119  Gross per 24 hour  Intake    245 ml  Output  1350 ml  Net  -1105 ml     Diet Order: Full Liquid   IVF:    sodium chloride Last Rate: 1,000 mL (06/27/12 1353)    Estimated Nutritional Needs:   Kcal:2150-2500 Protein:100-120g Fluid:2.1-2.5L  NUTRITION DIAGNOSIS: -Inadequate oral intake (NI-2.1).  Status: Ongoing  RELATED TO: not liking solid foods   AS EVIDENCE BY: 45-50% meal intake  MONITORING/EVALUATION(Goals): Pt to consume >90% of meals/supplements   EDUCATION NEEDS: -No education needs identified at this time  Dietitian #: [phone]  DOCUMENTATION CODES Per approved criteria  -Not Applicable    Marshall Cork 06/30/2012, 1:25 PM

## 2012-07-01 DIAGNOSIS — W19XXXA Unspecified fall, initial encounter: Secondary | ICD-10-CM | POA: Diagnosis not present

## 2012-07-01 MED ORDER — METOCLOPRAMIDE HCL 5 MG PO TABS
5.0000 mg | ORAL_TABLET | Freq: Three times a day (TID) | ORAL | Status: DC
  Administered 2012-07-01 – 2012-07-02 (×4): 5 mg via ORAL
  Filled 2012-07-01 (×8): qty 1

## 2012-07-01 MED ORDER — METOCLOPRAMIDE HCL 10 MG PO TABS: 10.0000 mg | ORAL_TABLET | Freq: Three times a day (TID) | ORAL | Status: DC

## 2012-07-01 NOTE — Progress Notes (Signed)
General Surgery Note  LOS: 10 days   Assessment/Plan: 1.  Pyelonephritis   Cultures with E. Coli, on Cipro  2.  Prostate cancer   Hydronephrosis, right - double J stent placed on right  Nephrostomy tube placed on right - followed by Dr. Logan Bores.  3.  Ileus - resolving.  LLQ colostomy functioning.  SBO requiring enterolysis by Dr. Zachery Dakins - 12/07/2009.  No acute general surgery issues.  Will sign off.  4. COPD (chronic obstructive pulmonary disease)  5.  Dyslipidemia (12/14/2011)  6.  AAA (abdominal aortic aneurysm)   5.0 cm on CT scan 06/21/2012  7.  Physical deconditioning  8.  AKI (acute kidney injury)  9.  H/O colostomy  10.  Fall   Subjective:  Doing better, tolerating full liquids, ostomy working, no abdominal pain. Objective:   Filed Vitals:   07/01/12 0542  BP: 127/82  Pulse: 69  Temp: 98.6 F (37 C)  Resp: 18     Intake/Output from previous day:  08/02 0701 - 08/03 0700 In: 490 [P.O.:240] Out: 1925 [Urine:1625; Stool:300]  Intake/Output this shift:      Physical Exam:   General: WN older WM who is alert and oriented.    HEENT: Normal. Pupils equal. .   Lungs: Clear     Abdomen: Soft.  LLQ ostomy with stool in bag, RLQ ileostomy with urine in the bag and right flank perc drain with urine.   Neurologic:  Grossly intact to motor and sensory function.   Psychiatric: Has normal mood and affect. Behavior is normal   Lab Results:   No results found for this basename: WBC:2,HGB:2,HCT:2,PLT:2 in the last 72 hours  BMET  No results found for this basename: NA:2,K:2,CL:2,CO2:2,GLUCOSE:2,BUN:2,CREATININE:2,CALCIUM:2 in the last 72 hours  PT/INR  No results found for this basename: LABPROT:2,INR:2 in the last 72 hours  ABG  No results found for this basename: PHART:2,PCO2:2,PO2:2,HCO3:2 in the last 72 hours   Studies/Results:  No results found.   Anti-infectives:   Anti-infectives     Start     Dose/Rate Route Frequency Ordered Stop   06/28/12 2000    ciprofloxacin (CIPRO) tablet 500 mg  Status:  Discontinued        500 mg Oral 2 times daily 06/28/12 1402 07/01/12 0818   06/22/12 0800   ciprofloxacin (CIPRO) IVPB 400 mg  Status:  Discontinued        400 mg 200 mL/hr over 60 Minutes Intravenous Every 12 hours 06/21/12 2159 06/28/12 1402   06/21/12 1945   ciprofloxacin (CIPRO) IVPB 400 mg        400 mg 200 mL/hr over 60 Minutes Intravenous  Once 06/21/12 1936 06/21/12 2048          Ovidio Kin, MD, FACS Pager: [phone],   [facility]: [phone] 07/01/2012

## 2012-07-01 NOTE — Progress Notes (Signed)
[facility] PROGRESS NOTE  Michael Novak ZOX:[id number] DOB: [birth year] DOA: 06/21/2012 PCP: Michael Relic, MD  Assessment/Plan: Principal Problem:  *Pyelonephritis  Placed on empiric Cipro upon admission.   Urine cultures grew Escherichia coli, Cipro sensitive. Switched to PO Cipro 06/28/12.  Has had 10 days of therapy.  Will d/c and monitor. Active Problems:  Dyslipidemia  Continue statin and fish oil.  H/O colostomy / Prostate cancer  Status post radioactive seed implantation complicated by prostatic-rectal fistula.  Status post cystoprostatectomy with ileal conduit as well as proctocolectomy with colostomy.  COPD (chronic obstructive pulmonary disease)  Stable. Continue Spiriva and when necessary Ventolin.  Hydronephrosis, right  Progressive worsening of his right hydronephrosis noted by urologist.  Right cutaneous nephrostomy tube was placed by IR 06/22/2012. A tube exchange along with a right retrograde nephroureterogram was done 06/26/2012. Attempt to internalize the tube with a double-J stent was made on 7/RD/2013 but this could not be done due to what appeared to be a complete obstruction/obliteration of the distal portion of the ureter on the right side just before it enters the conduit. Nephrostomy tube will need to be exchanged every 6-8 weeks.  Followup with Dr. Logan Novak as an outpatient once discharged, will need consideration of revision of his right ureteral-ileal anastomosis.  AAA (abdominal aortic aneurysm)  Followup with vascular surgeon as an outpatient.  Continue blood pressure control.  Ileus  Developed abdominal distention and nausea 06/23/12 prompting a abdominal x-ray.  Findings showed an ileus versus a SBO.  NG tube placed 06/24/12.  Surgical consultation requested and kindly provided by Dr. Johna Novak on 06/24/2012. Bowel rest and ongoing NG tube suction recommended along with minimizing narcotic pain medication use.  Serial abdominal films were  obtained, which showed gradual improvement.  NG clamping trials were begun on 06/27/2012. NG tube dislodged 06/27/2012.  No further nausea or vomiting. Tolerating full liquids. No further diet advancement unless stool in ostomy bag, per surgery.  Bowels finally moving.  Continue Sorbitol and Reglan (which had been started on 06/28/12).  Physical deconditioning/ Fall  Physical therapy evaluation done on 06/26/2012. Home health physical therapy with 24-hour supervision recommended.  The patient states his wife will be at home to provide 24 hour supervision.  Fell in bathroom on 07/01/12.  No noted injury.    AKI (acute kidney injury)  Secondary to right hydronephrosis. Once nephrostomy tube was placed, patient's creatinine began to improve.   Code Status: Full  Family Communication:  Michael Novak (son) [phone].  Wife updated at bedside 06/30/12. Disposition Plan: Home with home health services, likely 07/02/12.    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 He was found to have worsening right hydronephrosis, suspicious for worsening of the ureteral anastomosis with perinephric stranding.  Medical Consultants:  Dr. Glenna Novak, Surgery  Dr. Ihor Novak, Urology  Dr. Malachy Novak and Dr. Richarda Novak, IR  Other Consultants:  Physical therapy  Procedures:  Placement of right percutaneous nephrostomy tube 06/22/2012.  Right antegrade nephroureterogram 06/26/12. 10 French nephrostomy tube exchange for new tube.  Antibiotics:  Cipro 06/21/2012--->  HPI/Subjective: Michael Novak has had 2 full bags of stool in his ostomy pouch.  He is anxious to have diet advanced.   Denies N/V.  No significant pain.  Reports that the fall last night was because he "lost his balance". Denies dizziness or pre-syncopal type symptoms.  Assisted himself to the floor with the hand rail, and denied any injury.  Objective: Filed Vitals:   06/30/12 0804 06/30/12 2137 07/01/12 0228 07/01/12 0542  BP:  147/82 133/86 127/82  Pulse:  67 84 69  Temp:  98.4 F (36.9 C) 97.7 F (36.5 C) 98.6 F (37 C)  TempSrc:  Oral Oral Oral  Resp:  16 18 18   Height:      Weight:      SpO2: 92% 91% 93% 90%    Intake/Output Summary (Last 24 hours) at 07/01/12 0813 Last data filed at 07/01/12 0220  Gross per 24 hour  Intake    490 ml  Output   1400 ml  Net   -910 ml    Exam: Gen:  NAD Cardiovascular:  RRR, No M/R/G Respiratory: Lungs CTAB Gastrointestinal: Abdomen softly distended with normal active bowel sounds. Flatus and stools noted in ostomy. Extremities: No C/E/C  Data Reviewed: Basic Metabolic Panel:  Lab 06/26/12 4540  NA 137  K 3.9  CL 99  CO2 27  GLUCOSE 99  BUN 27*  CREATININE 1.24  CALCIUM 9.4  MG --  PHOS --   GFR Estimated Creatinine Clearance: 59.7 ml/min (by C-G formula based on Cr of 1.24). Liver Function Tests: No results found for this basename: AST:5,ALT:5,ALKPHOS:5,BILITOT:5,PROT:5,ALBUMIN:5 in the last 168 hours No results found for this basename: LIPASE:5,AMYLASE:5 in the last 168 hours CBC:  Lab 06/26/12 0340  WBC 4.4  NEUTROABS --  HGB 13.6  HCT 39.7  MCV 90.2  PLT 193   Microbiology Recent Results (from the past 240 hour(s))  URINE CULTURE     Status: Normal   Collection Time   06/21/12  6:57 PM      Component Value Range Status Comment   Specimen Description URINE, CLEAN CATCH   Final    Special Requests NONE   Final    Culture  Setup Time 06/22/2012 03:21   Final    Colony Count NO GROWTH   Final    Culture NO GROWTH   Final    Report Status 06/22/2012 FINAL   Final   BODY FLUID CULTURE     Status: Normal   Collection Time    06/22/12  3:35 PM      Component Value Range Status Comment   Specimen Description KIDNEY URINE   Final    Special Requests NONE   Final    Gram Stain     Final    Value: MODERATE WBC PRESENT,BOTH PMN AND MONONUCLEAR     NO ORGANISMS SEEN   Culture FEW ESCHERICHIA COLI   Final    Report Status 06/25/2012 FINAL   Final    Organism ID, Bacteria ESCHERICHIA COLI   Final   CULTURE, BLOOD (ROUTINE X 2)     Status: Normal   Collection Time   06/22/12  8:15 PM      Component Value Range Status Comment   Specimen Description BLOOD RIGHT ARM   Final    Special Requests BOTTLES DRAWN AEROBIC AND ANAEROBIC 10CC   Final    Culture  Setup Time 06/23/2012 01:34   Final    Culture NO GROWTH 5 DAYS   Final    Report Status 06/29/2012 FINAL   Final   CULTURE, BLOOD (ROUTINE X 2)     Status: Normal   Collection Time   06/22/12  8:20 PM      Component Value Range Status Comment   Specimen Description BLOOD RIGHT ARM   Final    Special Requests BOTTLES DRAWN AEROBIC AND ANAEROBIC 10CC   Final    Culture  Setup Time 06/23/2012 01:35   Final    Culture NO GROWTH 5 DAYS   Final    Report Status 06/29/2012 FINAL   Final      Studies:  Ct Abdomen Pelvis Wo Contrast 06/21/2012 IMPRESSION: Compared the prior study, right hydronephrosis has worsened and right perinephric stranding has worsened.  No ureteral calculi are seen.  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  Previously noted small bowel obstruction pattern has resolved.  Infrarenal abdominal aortic aneurysm has increased from 4.4 cm to 5.0 cm.  Exam is otherwise unchanged.  Original Report Authenticated By: Donavan Burnet, M.D.    Dg Abd 1 View 06/25/2012 IMPRESSION: Persistent gaseous distended small bowel loops consistent with ileus or partial small bowel obstruction.  Original Report Authenticated By: Natasha Mead, M.D.    Dg Abd 1 View 06/24/2012 IMPRESSION: Persistent gaseous  distended small bowel loops mid abdomen consistent with ileus or partial small bowel obstruction. NG tube in place.  Original Report Authenticated By: Natasha Mead, M.D.    Dg Abd 1 View 06/23/2012  IMPRESSION:  Marked gaseous distension of predominately the small bowel, though distal colonic gas and stool is identified.  While these findings may suggest ileus, early/partial small bowel obstruction is not excluded.  Continued attention on follow-up is recommended. Note this examination is nondiagnostic for the evaluation of pneumoperitoneum.  If this is of clinical concern, either upright or left lateral decubitus abdominal radiographs are recommended.  This was made a call report.  Original Report Authenticated By: Waynard Reeds, M.D.    Ir Nephrostomy Tube Change 06/26/2012  IMPRESSION:  1.  Antegrade nephrostogram demonstrates complete occlusion of the distal right ureter.  2.  Attempts at crossing the distal ureteral obstruction were unsuccessful.  3.  Successful exchange of 10-French percutaneous nephrostomy tube. This tube will be left to gravity drainage.  If surgical revision is not an option, the patient will require routine tube check and changes every 6 - 8 weeks.  Signed,  Sterling Big, MD Vascular & Interventional Radiologist [facility]  Original Report Authenticated By: Vilma Prader    Ir Nephrostogram Right 06/26/2012 IMPRESSION:  1.  Antegrade nephrostogram demonstrates complete occlusion of the distal right ureter.  2.  Attempts at crossing the distal ureteral obstruction were unsuccessful.  3.  Successful exchange of 10-French percutaneous nephrostomy tube. This tube will be left to gravity drainage.  If surgical revision is not an option, the patient will require routine tube check and changes every 6 - 8 weeks.  Signed,  Sterling Big, MD Vascular & Interventional Radiologist [facility]  Original Report Authenticated By: Vilma Prader    Ir Perc Nephrostomy Right  06/22/2012 Impression:Successful placement of a right percutaneous nephrostomy tube using ultrasound and fluoroscopic guidance.  Urine sample sent for culture.  Original Report Authenticated By: Michael Novak, M.D.    Dg Abd Portable 2v 06/26/2012 IMPRESSION: 1. Overall, the degree of gaseous distension has significantly improved compared to yesterday's examination.  While there continues to be some mild dilatation of small bowel, the overall appearance is one of a resolving ileus or partial small bowel obstruction. 2.  Support apparatus, as above.  Original Report Authenticated By:  Florencia Reasons, M.D.    Scheduled Meds:    . alum & mag hydroxide-simeth  30 mL Oral Daily  . antiseptic oral rinse  15 mL Mouth Rinse BID  . aspirin EC  81 mg Oral Daily  . bisacodyl  10 mg Oral Daily  . buPROPion  150 mg Oral BID  . ciprofloxacin  500 mg Oral BID  . feeding supplement  237 mL Oral BID BM  . gabapentin  300 mg Oral TID  . guaiFENesin  600 mg Oral BID  . heparin  5,000 Units Subcutaneous Q8H  . metoCLOPramide  5 mg Oral TID AC & HS  . multivitamin with minerals  1 tablet Oral Daily  . omega-3 acid ethyl esters  1 g Oral BID  . simvastatin  20 mg Oral QHS  . sorbitol  30 mL Oral Daily  . tiotropium  18 mcg Inhalation Daily   Continuous Infusions:    . sodium chloride 1,000 mL (06/27/12 1353)    Time spent: 25 minutes.   LOS: 10 days   RAMA,CHRISTINA  [facility] Pager [phone].  If 8PM-8AM, please contact night-coverage at [email or web address], password [facility] 07/01/2012, 8:13 AM

## 2012-07-01 NOTE — Progress Notes (Addendum)
At 0215, this RN arrived to room and found pt sitting on the floor in the bathroom. When asked what happened, pt stated "I was emptying the colostomy when I lost my balance." Pt denied any pain. Upon assessment of the patient, pt said that he was holding on the rail and assisted himself to the floor. No redness or bruising noted to back or buttocks. No injuries or bruising noted to the legs. Nurse tech and RN assisted patient back to the bed. Bed alarm placed. VS: 97.7, 133/86, 18, 93% RA. AC was notified by charge nurse. Lenny Pastel, NP notified, at [phone]. No orders were given. Will continue to monitor. Fall Safety Prevention plan was not previously reviewed with pt. This RN discussed the plan with the patient and instructed patient that we will not allow him to be left alone while toileting. Pt requested that we not notify his family because he was afraid it would scare his wife. Pt would prefer to notify his family when they visit him later this morning.

## 2012-07-01 NOTE — Consult Note (Signed)
WOC ostomy consult  Stoma type/location: LLQ Colostomy Stomal assessment/size: 1 and 1/2 inch slightly prolapsed stom;, dry , pale mucosa Peristomal assessment: intact, clear Treatment options for stomal/peristomal skin: none indicated Output : Per RN and noted in Progress notes by both Dr. Darnelle Catalan and Dr. Ezzard Standing, ostomy is now functioning.  I will not irrigate colostomy this am. I will not follow but will remain available to this patient and his medical and surgical teams.  Please re-consult if needed. Thanks, Ladona Mow, MSN, RN, [facility], CWOCN [phone])

## 2012-07-02 LAB — BASIC METABOLIC PANEL
BUN: 15 mg/dL (ref 6–23)
CO2: 25 mEq/L (ref 19–32)
Chloride: 97 mEq/L (ref 96–112)
Creatinine, Ser: 1.2 mg/dL (ref 0.50–1.35)
GFR calc Af Amer: 64 mL/min — ABNORMAL LOW (ref >90)
Potassium: 3.7 mEq/L (ref 3.5–5.1)

## 2012-07-02 LAB — CBC
HCT: 38.3 % — ABNORMAL LOW (ref 39.0–52.0)
Hemoglobin: 13.2 g/dL (ref 13.0–17.0)
MCV: 89.7 fL (ref 78.0–100.0)
RDW: 13.3 % (ref 11.5–15.5)
WBC: 4.7 10*3/uL (ref 4.0–10.5)

## 2012-07-02 MED ORDER — METOCLOPRAMIDE HCL 5 MG PO TABS: 5.0000 mg | ORAL_TABLET | Freq: Three times a day (TID) | ORAL | Status: DC

## 2012-07-02 MED ORDER — SORBITOL 70 % SOLN: 30.0000 mL | Freq: Every day | Status: DC

## 2012-07-02 MED ORDER — BISACODYL 5 MG PO TBEC: 10.0000 mg | DELAYED_RELEASE_TABLET | Freq: Every day | ORAL | Status: DC

## 2012-07-02 MED ORDER — TIOTROPIUM BROMIDE MONOHYDRATE 18 MCG IN CAPS: 18.0000 ug | ORAL_CAPSULE | Freq: Every day | RESPIRATORY_TRACT | Status: DC

## 2012-07-02 NOTE — Progress Notes (Signed)
  Subjective: Up in chair having breakfast, without new complaints.   Objective: Vital signs in last 24 hours: Temp:  [98 F (36.7 C)-98.9 F (37.2 C)] 98.4 F (36.9 C) (08/04 0524) Pulse Rate:  [64-77] 64  (08/04 0524) Resp:  [16-18] 16  (08/04 0524) BP: (119-136)/(71-86) 128/76 mmHg (08/04 0524) SpO2:  [88 %-94 %] 88 % (08/04 0808) Last BM Date: 07/01/12  Intake/Output from previous day: 08/03 0701 - 08/04 0700 In: 600 [P.O.:600] Out: 2050 [Urine:1900; Stool:150] Intake/Output this shift:   RT PCN intact.  Has had great UOP from this drain since placement. E.Coli on cultures being treated with cipro. urinary output from this drain 8/3. Blood tinged urine in bag and foley.   Lab Results:   [facility] 07/02/12 0422  WBC 4.7  HGB 13.2  HCT 38.3*  PLT 247   BMET  Basename 07/02/12 0422  NA 132*  K 3.7  CL 97  CO2 25  GLUCOSE 104*  BUN 15  CREATININE 1.20  CALCIUM 9.1     Anti-infectives: Anti-infectives     Start     Dose/Rate Route Frequency Ordered Stop   06/28/12 2000   ciprofloxacin (CIPRO) tablet 500 mg  Status:  Discontinued        500 mg Oral 2 times daily 06/28/12 1402 07/01/12 0818   06/22/12 0800   ciprofloxacin (CIPRO) IVPB 400 mg  Status:  Discontinued        400 mg 200 mL/hr over 60 Minutes Intravenous Every 12 hours 06/21/12 2159 06/28/12 1402   06/21/12 1945   ciprofloxacin (CIPRO) IVPB 400 mg        400 mg 200 mL/hr over 60 Minutes Intravenous  Once 06/21/12 1936 06/21/12 2048          Assessment/Plan:  Rt hydronephrosis s/p PCN placement 7/25 with good UOP from PCN. Cultures positive - being treated. To continue drain for now. IR to follow few days a week while remains inpatient.   LOS: 11 days    CAMPBELL,PAMELA D 07/02/2012

## 2012-07-02 NOTE — Progress Notes (Signed)
Patient will discharge home with family in stable condition. Patient and family given all discharge instructions and expressed understanding.

## 2012-07-02 NOTE — Discharge Summary (Signed)
Physician Discharge Summary  Michael Novak ZOX:[id number] DOB: 1930/10/16 DOA: 06/21/2012  PCP: Kimber Relic, MD  Admit date: 06/21/2012 Discharge date: 07/02/2012  Recommendations for Outpatient Follow-up:  1. Needs to follow up with interventional radiology in 6 weeks for change of nephrostomy tube. 2. Needs to follow up with Dr. Logan Bores (urologist) for reasons noted below. 3. Referral to vascular surgeon, when deemed appropriate by PCP, for evaluation of AAA.  Discharge Diagnoses:   Principal Problem:  *Pyelonephritis  Active Problems:   Prostate cancer   COPD (chronic obstructive pulmonary disease)   Dyslipidemia   Hydronephrosis, right,  status post right nephrostomy tube placement   AAA (abdominal aortic aneurysm)   Ileus   Physical deconditioning   AKI (acute kidney injury)   H/O colostomy   Fall   Discharge Condition: Improved.  Diet recommendation: Low sodium, heart healthy.  History of present illness:  Mr. 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 He was found to have worsening right hydronephrosis, suspicious for worsening of the ureteral anastomosis with perinephric stranding.   Hospital Course by problem:  Principal Problem:  *Pyelonephritis  Placed on empiric Cipro upon admission.  Urine cultures grew Escherichia coli, Cipro sensitive. Switched to PO Cipro 06/28/12. He has completed 10 days of therapy, and is now off Cipro.  Can resume Macrobid for suppressive therapy at d/c. Active Problems:  Dyslipidemia  Continued on statin and fish oil. H/O colostomy / Prostate cancer  Status post radioactive seed implantation complicated by prostatic-rectal fistula.    Status post cystoprostatectomy with ileal conduit as well as proctocolectomy with colostomy. F/U with Dr. Logan Bores post discharge. COPD (chronic obstructive pulmonary disease)  Stable. Continue Spiriva. Hydronephrosis, right  Progressive worsening of his right hydronephrosis noted by urologist.  Right cutaneous nephrostomy tube was placed by IR 06/22/2012. A tube exchange along with a right retrograde nephroureterogram was done 06/26/2012. Attempt to internalize the tube with a double-J stent was made but this could not be done due to what appeared to be a complete obstruction/obliteration of the distal portion of the ureter on the right side just before it enters the conduit. Nephrostomy tube will need to be exchanged every 6-8 weeks.  Followup with Dr. Logan Bores as an outpatient once discharged, will need consideration of revision of his right ureteral-ileal anastomosis.  An appointment has been made for 07/05/12 at 8:30 a.m. AAA (abdominal aortic aneurysm)  Followup with vascular surgeon as an outpatient.  Continue blood pressure control. Ileus  Developed abdominal distention and nausea 06/23/12 prompting a abdominal x-ray. Findings showed an ileus versus a SBO.  NG tube placed 06/24/12.  Surgical consultation requested and kindly provided by Dr. Johna Sheriff on 06/24/2012. Bowel rest and ongoing NG tube suction recommended along with minimizing narcotic pain medication use.  Serial abdominal films were obtained, which showed gradual improvement.  NG clamping trials were begun on 06/27/2012. NG tube dislodged 06/27/2012. No further nausea or vomiting and was able to tolerate gradual advancement of his diet, once stools were noted in ostomy pouch.  Ostomy did need irrigation several times to facilitate bowel movements. Continue bowel regimen with Reglan, Sorbitol, Dulcolax. Physical deconditioning/ Fall  Physical therapy evaluation done on 06/26/2012. Home health physical therapy with 24-hour supervision  recommended. The patient states his wife  will be at home to provide 24 hour supervision.  Fell in bathroom on 07/01/12. No noted injury.  AKI (acute kidney injury)  Secondary to right hydronephrosis. Once nephrostomy tube was placed, patient's creatinine began to improve. Creatinine is at baseline values (1.2)  Medical Consultants:  Dr. Glenna Fellows, Surgery  Dr. Ihor Gully, Urology  Dr. Malachy Moan and Dr. Richarda Overlie, IR Other Consultants:  Physical therapy Procedures:  Placement of right percutaneous nephrostomy tube 06/22/2012.  Right antegrade nephroureterogram 06/26/12. 10 French nephrostomy tube exchange for new tube.   Discharge Exam: Filed Vitals:   07/02/12 0524  BP: 128/76  Pulse: 64  Temp: 98.4 F (36.9 C)  Resp: 16   Filed Vitals:   07/01/12 2013 07/02/12 0159 07/02/12 0524 07/02/12 0808  BP: 121/73 136/86 128/76   Pulse: 70 67 64   Temp: 98.9 F (37.2 C) 98.8 F (37.1 C) 98.4 F (36.9 C)   TempSrc: Oral Oral Oral   Resp: 17 18 16    Height:      Weight:      SpO2: 91% 93% 93% 88%    Gen:  NAD Cardiovascular:  RRR, No M/R/G Respiratory: Lungs CTAB Gastrointestinal: Abdomen soft, NT/ND with normal active bowel sounds.  Brown stool and flatus in ostomy bag. Extremities: No C/E/C   Discharge Instructions  Discharge Orders    Future Appointments: Provider: Department: Dept Phone: Center:   07/18/2012 3:15 PM Ardeth Sportsman, MD [facility] [phone] None   10/24/2012 9:00 AM [facility] [phone] [facility]   10/24/2012 9:40 AM Evern Bio, NP [facility] [phone] [facility]     Future Orders Please Complete By Expires   Diet - low sodium heart healthy      Increase activity slowly      Walk with assistance      Call MD for:  persistant nausea and vomiting      Call MD for:  severe uncontrolled pain      Call MD for:  extreme fatigue        Medication List  As of 07/02/2012 10:26 AM   STOP taking these medications           ciprofloxacin 500 MG tablet         TAKE these medications         aspirin 81 MG tablet   Take 81 mg by mouth daily. Ask patient to clarify dosage. Not indicated on history form dated 04/02/11.      bisacodyl 5 MG EC tablet   Commonly known as: DULCOLAX   Take 2 tablets (10 mg total) by mouth daily.      buPROPion 150 MG 12 hr tablet   Commonly known as: WELLBUTRIN SR   Take 150 mg by mouth 2 (two) times daily.      calcium & magnesium carbonates 311-232 MG per tablet   Commonly known as: MYLANTA   Take 1 tablet by mouth daily.      gabapentin 300 MG capsule   Commonly known as: NEURONTIN   Take 300 mg by mouth 3 (three) times daily.      glucosamine-chondroitin 500-400 MG tablet   Take 1 tablet by mouth 3 (three) times daily.      metoCLOPramide 5 MG tablet   Commonly known as: REGLAN   Take 1 tablet (5 mg total) by mouth 4 (four) times daily -  before meals and at bedtime.      multivitamin tablet   Take 1  tablet by mouth daily.      nitrofurantoin 50 MG capsule   Commonly known as: MACRODANTIN   Take 50 mg by mouth at bedtime.      omega-3 acid ethyl esters 1 G capsule   Commonly known as: LOVAZA   Take 1 g by mouth 2 (two) times daily.      simvastatin 20 MG tablet   Commonly known as: ZOCOR   Take 20 mg by mouth at bedtime.      sorbitol 70 % Soln   Take 30 mLs by mouth daily.      tiotropium 18 MCG inhalation capsule   Commonly known as: SPIRIVA   Place 1 capsule (18 mcg total) into inhaler and inhale daily.           Follow-up Information    Follow up with [facility], HEATH K, MD. (Call for an appt, need to have urostomy tube changed in  6 weeks)    Contact information:   [address], [address] [facility] Radiology [place] [address] [phone]       Follow up with Marcelyn Bruins, MD on 07/05/2012. (8:30 a.m.)    Contact information:   [facility] [address] [place]  [address] [phone]       Follow up with [facility], Lenon Curt, MD. (As needed)    Contact information:   [address] [place] [address] [phone]           The results of significant diagnostics from this hospitalization (including imaging, microbiology, ancillary and laboratory) are listed below for reference.    Significant Diagnostic Studies: Ct Abdomen Pelvis Wo Contrast 06/21/2012 IMPRESSION: Compared the prior study, right hydronephrosis has worsened and right perinephric stranding has worsened. No ureteral calculi are seen. 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 Previously noted small bowel obstruction pattern has resolved. Infrarenal abdominal aortic aneurysm has increased from 4.4 cm to 5.0 cm. Exam is otherwise unchanged. Original Report Authenticated By: Donavan Burnet, M.D.  Dg Abd 1 View 06/25/2012 IMPRESSION: Persistent gaseous distended small bowel loops consistent with ileus or partial small bowel obstruction. Original Report Authenticated By: Natasha Mead, M.D.  Dg Abd 1 View 06/24/2012 IMPRESSION: Persistent gaseous distended small bowel loops mid abdomen consistent with ileus or partial small bowel obstruction. NG tube in place. Original Report Authenticated By: Natasha Mead, M.D.  Dg Abd 1 View 06/23/2012 IMPRESSION: Marked gaseous distension of predominately the small bowel, though distal colonic gas and stool is identified. While these findings may suggest ileus, early/partial small bowel obstruction is not excluded. Continued attention on follow-up is recommended. Note this examination is nondiagnostic for the evaluation of pneumoperitoneum. If this is of clinical concern, either upright or left lateral decubitus abdominal radiographs are recommended. This was made a call report. Original Report Authenticated By: Waynard Reeds, M.D.  Ir Nephrostomy Tube Change 06/26/2012 IMPRESSION: 1. Antegrade  nephrostogram demonstrates complete occlusion of the distal right ureter. 2. Attempts at crossing the distal ureteral obstruction were unsuccessful. 3. Successful exchange of 10-French percutaneous nephrostomy tube. This tube will be left to gravity drainage. If surgical revision is not an option, the patient will require routine tube check and changes every 6 - 8 weeks. Signed, Sterling Big, MD Vascular & Interventional Radiologist [facility]iology Original Report Authenticated By: Vilma Prader  Ir Nephrostogram Right 06/26/2012 IMPRESSION: 1. Antegrade nephrostogram demonstrates complete occlusion of  the distal right ureter. 2. Attempts at crossing the distal ureteral obstruction were unsuccessful. 3. Successful exchange of 10-French percutaneous nephrostomy tube. This tube will be left to gravity drainage. If surgical revision is not an option, the patient will require routine tube check and changes every 6 - 8 weeks. Signed, Sterling Big, MD Vascular & Interventional Radiologist [facility] Radiology Original Report Authenticated By: Vilma Prader  Ir Perc Nephrostomy Right 06/22/2012 Impression:Successful placement of a right percutaneous nephrostomy tube using ultrasound and fluoroscopic guidance. Urine sample sent for culture. Original Report Authenticated By: Richarda Overlie, M.D.  Dg Abd Portable 2v 06/26/2012 IMPRESSION: 1. Overall, the degree of gaseous distension has significantly improved compared to yesterday's examination. While there continues to be some mild dilatation of small bowel, the overall appearance is one of a resolving ileus or partial small bowel obstruction. 2. Support apparatus, as above. Original Report Authenticated By: Florencia Reasons, M.D.     Microbiology: Recent Results (from the past 240 hour(s))  BODY FLUID CULTURE     Status: Normal   Collection Time   06/22/12  3:35 PM      Component Value Range Status Comment   Specimen Description KIDNEY URINE   Final    Special  Requests NONE   Final    Gram Stain     Final    Value: MODERATE WBC PRESENT,BOTH PMN AND MONONUCLEAR     NO ORGANISMS SEEN   Culture FEW ESCHERICHIA COLI   Final    Report Status 06/25/2012 FINAL   Final    Organism ID, Bacteria ESCHERICHIA COLI   Final   CULTURE, BLOOD (ROUTINE X 2)     Status: Normal   Collection Time   06/22/12  8:15 PM      Component Value Range Status Comment   Specimen Description BLOOD RIGHT ARM   Final    Special Requests BOTTLES DRAWN AEROBIC AND ANAEROBIC 10CC   Final    Culture  Setup Time 06/23/2012 01:34   Final    Culture NO GROWTH 5 DAYS   Final    Report Status 06/29/2012 FINAL   Final   CULTURE, BLOOD (ROUTINE X 2)     Status: Normal   Collection Time   06/22/12  8:20 PM      Component Value Range Status Comment   Specimen Description BLOOD RIGHT ARM   Final    Special Requests BOTTLES DRAWN AEROBIC AND ANAEROBIC 10CC   Final    Culture  Setup Time 06/23/2012 01:35   Final    Culture NO GROWTH 5 DAYS   Final    Report Status 06/29/2012 FINAL   Final      Labs: Basic Metabolic Panel:  Lab 07/02/12 2956 06/26/12 0340  NA 132* 137  K 3.7 3.9  CL 97 99  CO2 25 27  GLUCOSE 104* 99  BUN 15 27*  CREATININE 1.20 1.24  CALCIUM 9.1 9.4  MG -- --  PHOS -- --   CBC:  Lab 07/02/12 0422 06/26/12 0340  WBC 4.7 4.4  NEUTROABS -- --  HGB 13.2 13.6  HCT 38.3* 39.7  MCV 89.7 90.2  PLT 247 193    Time coordinating discharge: 35 minutes.  Signed:  RAMA,CHRISTINA  Pager [phone] [facility] 07/02/2012, 10:26 AM

## 2012-07-10 ENCOUNTER — Other Ambulatory Visit: Payer: Self-pay

## 2012-07-11 ENCOUNTER — Other Ambulatory Visit: Payer: Self-pay

## 2012-07-11 DIAGNOSIS — N1330 Unspecified hydronephrosis: Secondary | ICD-10-CM

## 2012-07-18 ENCOUNTER — Encounter (INDEPENDENT_AMBULATORY_CARE_PROVIDER_SITE_OTHER): Payer: Medicare Other | Admitting: Surgery

## 2012-07-18 ENCOUNTER — Encounter (HOSPITAL_COMMUNITY): Payer: Self-pay | Admitting: Pharmacy Technician

## 2012-07-26 ENCOUNTER — Other Ambulatory Visit: Payer: Self-pay

## 2012-07-26 ENCOUNTER — Ambulatory Visit (HOSPITAL_COMMUNITY)
Admission: RE | Admit: 2012-07-26 | Discharge: 2012-07-26 | Disposition: A | Discharge status: Home / Self Care | Payer: Medicare Other | Source: Ambulatory Visit

## 2012-07-26 ENCOUNTER — Ambulatory Visit (HOSPITAL_COMMUNITY): Admission: RE | Admit: 2012-07-26 | Payer: Medicare Other | Source: Ambulatory Visit

## 2012-07-26 ENCOUNTER — Encounter (HOSPITAL_COMMUNITY): Payer: Self-pay

## 2012-07-26 DIAGNOSIS — N1330 Unspecified hydronephrosis: Secondary | ICD-10-CM

## 2012-07-26 DIAGNOSIS — Z8546 Personal history of malignant neoplasm of prostate: Secondary | ICD-10-CM | POA: Insufficient documentation

## 2012-07-26 DIAGNOSIS — N135 Crossing vessel and stricture of ureter without hydronephrosis: Secondary | ICD-10-CM | POA: Insufficient documentation

## 2012-07-26 DIAGNOSIS — Z87891 Personal history of nicotine dependence: Secondary | ICD-10-CM | POA: Insufficient documentation

## 2012-07-26 DIAGNOSIS — J449 Chronic obstructive pulmonary disease, unspecified: Secondary | ICD-10-CM | POA: Insufficient documentation

## 2012-07-26 DIAGNOSIS — Z906 Acquired absence of other parts of urinary tract: Secondary | ICD-10-CM | POA: Insufficient documentation

## 2012-07-26 DIAGNOSIS — Z79899 Other long term (current) drug therapy: Secondary | ICD-10-CM | POA: Insufficient documentation

## 2012-07-26 DIAGNOSIS — Z85828 Personal history of other malignant neoplasm of skin: Secondary | ICD-10-CM | POA: Insufficient documentation

## 2012-07-26 DIAGNOSIS — J4489 Other specified chronic obstructive pulmonary disease: Secondary | ICD-10-CM | POA: Insufficient documentation

## 2012-07-26 LAB — PROTIME-INR: Prothrombin Time: 14.1 seconds (ref 11.6–15.2)

## 2012-07-26 LAB — CBC
Platelets: 160 10*3/uL (ref 150–400)
RDW: 14.1 % (ref 11.5–15.5)
WBC: 4.7 10*3/uL (ref 4.0–10.5)

## 2012-07-26 LAB — BASIC METABOLIC PANEL
Chloride: 99 mEq/L (ref 96–112)
GFR calc Af Amer: 52 mL/min — ABNORMAL LOW (ref >90)
Potassium: 4.4 mEq/L (ref 3.5–5.1)
Sodium: 135 mEq/L (ref 135–145)

## 2012-07-26 MED ORDER — MIDAZOLAM HCL 5 MG/5ML IJ SOLN
INTRAMUSCULAR | Status: AC | PRN
  Administered 2012-07-26: 1 mg via INTRAVENOUS

## 2012-07-26 MED ORDER — LIDOCAINE HCL 1 % IJ SOLN
INTRAMUSCULAR | Status: AC
  Filled 2012-07-26: qty 20

## 2012-07-26 MED ORDER — SODIUM CHLORIDE 0.9 % IV SOLN
INTRAVENOUS | Status: DC
  Administered 2012-07-26: 10:00:00 via INTRAVENOUS

## 2012-07-26 MED ORDER — MIDAZOLAM HCL 2 MG/2ML IJ SOLN
INTRAMUSCULAR | Status: AC
  Filled 2012-07-26: qty 6

## 2012-07-26 MED ORDER — CIPROFLOXACIN IN D5W 400 MG/200ML IV SOLN
400.0000 mg | Freq: Once | INTRAVENOUS | Status: AC
  Administered 2012-07-26: 400 mg via INTRAVENOUS
  Filled 2012-07-26: qty 200

## 2012-07-26 MED ORDER — IOHEXOL 300 MG/ML  SOLN: 40.0000 mL | Freq: Once | INTRAMUSCULAR | Status: AC | PRN

## 2012-07-26 MED ORDER — FENTANYL CITRATE 0.05 MG/ML IJ SOLN
INTRAMUSCULAR | Status: AC
  Filled 2012-07-26: qty 6

## 2012-07-26 MED ORDER — FENTANYL CITRATE 0.05 MG/ML IJ SOLN
INTRAMUSCULAR | Status: AC | PRN
  Administered 2012-07-26 (×2): 50 ug via INTRAVENOUS

## 2012-07-26 NOTE — Procedures (Signed)
Successful placement of nephroureteral catheter through distal ureter stricture.  Placement of 10 Fr. catheter and removal of nephrostomy tube.  No immediate complication, see dictated report.

## 2012-07-26 NOTE — H&P (Signed)
Chief Complaint: Right ureteral obstruction Referring Physician:Evans HPI: Michael Novak is an 78 y.o. male with a previously placed rt PCN due to severe hydronephrosis with infection. We were not able to place stent through PCN due to distal ureteral obstruction. He returns today for another attempt at JJ stent, but otherwise a PCN exchange. He has been ok, no other recent infections or c/o. His bowels and ileal conduit have been functioning well. His breathing is good. He denies SOB, CP  Past Medical History:  Past Medical History  Diagnosis Date  . History of colostomy   . Urostomy stenosis     Ask patient to clarify, per history form dated 04/02/11.  Marland Kitchen Peristomal hernia   . Prostate cancer   . Skin cancer   . Neuropathy     in feet   . COPD (chronic obstructive pulmonary disease)   . Depression   . UTI (lower urinary tract infection)     history     Past Surgical History:  Past Surgical History  Procedure Date  . Cystectomy, ileal coduit, colostomy 2002    for severe rectourethral fistula  . Ex lap, lysis of adhesions 2010    for SBO  . Lap repair of parastomal hernias 2009    Dr. Barnetta Chapel [facility],  Hematoma evac POD#1    Family History: No family history on file.  Social History:  reports that he has quit smoking. His smoking use included Cigarettes. He quit after 37 years of use. He has never used smokeless tobacco. He reports that he drinks alcohol. He reports that he does not use illicit drugs.  Allergies:  Allergies  Allergen Reactions  . Amoxicillin-Pot Clavulanate Other (See Comments)    unknown    Medications: albuterol (PROVENTIL HFA;VENTOLIN HFA) 108 (90 BASE) MCG/ACT inhaler (Taking) Sig - Route: Inhale 2 puffs into the lungs 3 (three) times daily as needed. For shortness of breath - Inhalation Class: Historical Med aspirin 81 MG tablet (Taking) Sig - Route: Take 81 mg by mouth daily. Ask patient to clarify dosage. Not indicated on history form dated 04/02/11. -  Oral Class: Historical Med Number of times this order has been changed since signing: 1 Order [address] bisacodyl (DULCOLAX) 5 MG EC tablet (Taking) 07/02/2012 08/01/2012 Sig - Route: Take 10 mg by mouth daily as needed. - Oral Class: Historical Med buPROPion (WELLBUTRIN SR) 150 MG 12 hr tablet (Taking) Sig - Route: Take 150 mg by mouth 2 (two) times daily. - Oral Class: Historical Med Number of times this order has been changed since signing: 1 Order [address] ciprofloxacin (CIPRO) 500 MG tablet (Taking) Sig - Route: Take 500 mg by mouth 2 (two) times daily. Take twice daily for 7 days as needed for kidney infection - Oral Class: Historical Med Cyanocobalamin (VITAMIN B 12 PO) (Taking) Sig - Route: Take 500 mcg by mouth daily. - Oral Class: Historical Med gabapentin (NEURONTIN) 300 MG capsule (Taking) Sig - Route: Take 300 mg by mouth 3 (three) times daily. - Oral Class: Historical Med Number of times this order has been changed since signing: 1 Order [address] glucosamine-chondroitin 500-400 MG tablet (Taking) Sig - Route: Take 1 tablet by mouth 3 (three) times daily. - Oral Class: Historical Med metoCLOPramide (REGLAN) 5 MG tablet (Taking) Sig - Route: Take 5 mg by mouth 4 (four) times daily. Before meals and at bedtime - Oral Class: Historical Med Multiple Vitamin (MULTIVITAMIN) tablet (Taking) Sig - Route: Take 1 tablet by mouth daily. -  Oral Class: Historical Med Number of times this order has been changed since signing: 1 Order [address] nitrofurantoin (MACRODANTIN) 50 MG capsule (Taking) Sig - Route: Take 50 mg by mouth at bedtime. - Oral Class: Historical Med simvastatin (ZOCOR) 20 MG tablet (Taking) Sig - Route: Take 20 mg by mouth at bedtime. - Oral Class: Historical Med Number of times this order has been changed since signing: 1 Order [address] sorbitol 70 % SOLN (Taking) 500 mL 3 07/02/2012 Sig - Route: Take 30 mLs by mouth daily. - Oral tiotropium (SPIRIVA) 18 MCG inhalation capsule (Taking) 30  capsule 07/02/2012 07/02/2013 Sig - Route: Place 1 capsule (18 mcg total) into inhaler and inhale daily. - Inhalation Class: No Print traMADol (ULTRAM) 50 MG tablet (Taking) Sig - Route: Take 50 mg by mouth daily as needed. For pain - Oral Class: Historical Med metoCLOPramide (REGLAN) 5 MG tablet 120 tablet 3 07/02/2012 07/12/2012 Sig - Route: Take 1 tablet (5 mg total) by mouth 4 (four) times daily - before meals and at bedtime. - Oral omega-3 acid ethyl esters (LOVAZA) 1 G capsule Sig - Route: Take 1 g by mouth 2 (two) times daily. - Oral Class: Historical Med   Please HPI for pertinent positives, otherwise complete 10 system ROS negative.  Physical Exam: Blood pressure 108/58, pulse 63, temperature 97.6 F (36.4 C), resp. rate 16, height 6\' 2"  (1.88 m), weight 218 lb (98.884 kg), SpO2 97.00%. Body mass index is 27.99 kg/(m^2).   General Appearance:  Alert, cooperative, no distress, appears stated age  Head:  Normocephalic, without obvious abnormality, atraumatic  ENT: Unremarkable  Neck: Supple, symmetrical, trachea midline, no adenopathy, thyroid: not enlarged, symmetric, no tenderness/mass/nodules  Lungs:   Clear to auscultation bilaterally, no w/r/r, respirations unlabored without use of accessory muscles.  Flank:  Rt PCN intact, site clean  Heart:  Regular rate and rhythm, S1, S2 normal, no murmur, rub or gallop. Carotids 2+ without bruit.  Abdomen:   Soft, non-tender. LLQ ostomy with periostomal hernia. RLQ ileal conduit.  Extremities: Extremities normal, atraumatic, no cyanosis or edema  Neurologic: Normal affect, no gross deficits.   Results for orders placed during the hospital encounter of 07/26/12 (from the past 48 hour(s))  CBC     Status: Normal   Collection Time   07/26/12  7:55 AM      Component Value Range Comment   WBC 4.7  4.0 - 10.5 K/uL    RBC 4.96  4.22 - 5.81 MIL/uL    Hemoglobin 15.2  13.0 - 17.0 g/dL    HCT 16.1  09.6 - 04.5 %    MCV 88.5  78.0 - 100.0 fL    MCH 30.6   26.0 - 34.0 pg    MCHC 34.6  30.0 - 36.0 g/dL    RDW 40.9  81.1 - 91.4 %    Platelets 160  150 - 400 K/uL   BASIC METABOLIC PANEL     Status: Abnormal   Collection Time   07/26/12  7:55 AM      Component Value Range Comment   Sodium 135  135 - 145 mEq/L    Potassium 4.4  3.5 - 5.1 mEq/L    Chloride 99  96 - 112 mEq/L    CO2 24  19 - 32 mEq/L    Glucose, Bld 108 (*) 70 - 99 mg/dL    BUN 21  6 - 23 mg/dL    Creatinine, Ser 7.82 (*) 0.50 - 1.35 mg/dL  Calcium 9.7  8.4 - 10.5 mg/dL    GFR calc non Af Amer 45 (*) >90 mL/min    GFR calc Af Amer 52 (*) >90 mL/min   PROTIME-INR     Status: Normal   Collection Time   07/26/12  7:55 AM      Component Value Range Comment   Prothrombin Time 14.1  11.6 - 15.2 seconds    INR 1.07  0.00 - 1.49    No results found.  Assessment/Plan Hydronephrosis with distal rt ureteral obstruction S/p Rt PCN Attempt at JJ stent today, otherwise exchange PCN Labs ok. Reviewed procedure with pt and family. Consent signed in chart.  Brayton El PA-C 07/26/2012, 8:36 AM

## 2012-08-22 DIAGNOSIS — R634 Abnormal weight loss: Secondary | ICD-10-CM

## 2012-08-22 HISTORY — DX: Abnormal weight loss: R63.4

## 2012-08-28 ENCOUNTER — Encounter (HOSPITAL_COMMUNITY): Payer: Self-pay | Admitting: Pharmacy Technician

## 2012-09-06 ENCOUNTER — Other Ambulatory Visit: Payer: Self-pay

## 2012-09-06 ENCOUNTER — Ambulatory Visit (HOSPITAL_COMMUNITY)
Admission: RE | Admit: 2012-09-06 | Discharge: 2012-09-06 | Disposition: A | Discharge status: Home / Self Care | Payer: Medicare Other | Source: Ambulatory Visit | Attending: Urology | Admitting: Urology

## 2012-09-06 DIAGNOSIS — N1330 Unspecified hydronephrosis: Secondary | ICD-10-CM | POA: Insufficient documentation

## 2012-09-06 DIAGNOSIS — Z436 Encounter for attention to other artificial openings of urinary tract: Secondary | ICD-10-CM | POA: Insufficient documentation

## 2012-09-06 NOTE — Procedures (Signed)
Successful rt nephroureteral cath exchg No comp Stable

## 2012-10-16 ENCOUNTER — Encounter (HOSPITAL_COMMUNITY): Payer: Self-pay | Admitting: Pharmacy Technician

## 2012-10-17 ENCOUNTER — Encounter (HOSPITAL_COMMUNITY): Payer: Self-pay

## 2012-10-18 ENCOUNTER — Other Ambulatory Visit: Payer: Self-pay

## 2012-10-18 ENCOUNTER — Ambulatory Visit (HOSPITAL_COMMUNITY)
Admission: RE | Admit: 2012-10-18 | Discharge: 2012-10-18 | Disposition: A | Discharge status: Home / Self Care | Payer: Medicare Other | Source: Ambulatory Visit | Attending: Urology | Admitting: Urology

## 2012-10-18 DIAGNOSIS — N1330 Unspecified hydronephrosis: Secondary | ICD-10-CM | POA: Insufficient documentation

## 2012-10-18 DIAGNOSIS — Z436 Encounter for attention to other artificial openings of urinary tract: Secondary | ICD-10-CM | POA: Insufficient documentation

## 2012-10-18 MED ORDER — IOHEXOL 300 MG/ML  SOLN: 17.0000 mL | Freq: Once | INTRAMUSCULAR | Status: AC | PRN

## 2012-10-18 NOTE — Procedures (Signed)
Successful right sided NUS exchange.  No immediate complications.

## 2012-10-23 ENCOUNTER — Encounter: Payer: Self-pay | Admitting: Neurosurgery

## 2012-10-24 ENCOUNTER — Ambulatory Visit (INDEPENDENT_AMBULATORY_CARE_PROVIDER_SITE_OTHER): Payer: Medicare Other | Admitting: Neurosurgery

## 2012-10-24 ENCOUNTER — Encounter: Payer: Self-pay | Admitting: Neurosurgery

## 2012-10-24 ENCOUNTER — Encounter (INDEPENDENT_AMBULATORY_CARE_PROVIDER_SITE_OTHER): Payer: Medicare Other | Admitting: *Deleted

## 2012-10-24 VITALS — BP 134/80 | HR 53 | Resp 16 | Ht 74.0 in | Wt 200.6 lb

## 2012-10-24 DIAGNOSIS — I714 Abdominal aortic aneurysm, without rupture: Secondary | ICD-10-CM

## 2012-10-24 NOTE — Progress Notes (Signed)
[facility] [place] AAA/PAD/PVD Office Note  CC: AAA surveillance Referring Physician: Early  History of Present Illness: -year-old male patient of Dr. Arbie Cookey with known AAA. The patient denies any abdominal or back pain. The patient denies any new medical diagnoses or recent surgery.  Past Medical History  Diagnosis Date  . History of colostomy   . Urostomy stenosis     Ask patient to clarify, per history form dated 04/02/11.  Marland Kitchen Peristomal hernia   . Prostate cancer   . Skin cancer   . Neuropathy     in feet   . COPD (chronic obstructive pulmonary disease)   . Depression   . UTI (lower urinary tract infection)     history   . AAA (abdominal aortic aneurysm)     ROS: [x]  Positive   [ ]  Denies    General: [ ]  Weight loss, [ ]  Fever, [ ]  chills Neurologic: [ ]  Dizziness, [ ]  Blackouts, [ ]  Seizure [ ]  Stroke, [ ]  "Mini stroke", [ ]  Slurred speech, [ ]  Temporary blindness; [ ]  weakness in arms or legs, [ ]  Hoarseness Cardiac: [ ]  Chest pain/pressure, [ ]  Shortness of breath at rest [ ]  Shortness of breath with exertion, [ ]  Atrial fibrillation or irregular heartbeat Vascular: [ ]  Pain in legs with walking, [ ]  Pain in legs at rest, [ ]  Pain in legs at night,  [ ]  Non-healing ulcer, [ ]  Blood clot in vein/DVT,   Pulmonary: [ ]  Home oxygen, [ ]  Productive cough, [ ]  Coughing up blood, [ ]  Asthma,  [ ]  Wheezing Musculoskeletal:  [ ]  Arthritis, [ ]  Low back pain, [ ]  Joint pain Hematologic: [ ]  Easy Bruising, [ ]  Anemia; [ ]  Hepatitis Gastrointestinal: [ ]  Blood in stool, [ ]  Gastroesophageal Reflux/heartburn, [ ]  Trouble swallowing Urinary: [ ]  chronic Kidney disease, [ ]  on HD - [ ]  MWF or [ ]  TTHS, [ ]  Burning with urination, [ ]  Difficulty urinating Skin: [ ]  Rashes, [ ]  Wounds Psychological: [ ]  Anxiety, [ ]  Depression   Social History History  Substance Use Topics  . Smoking status: Former Smoker -- 37 years    Types: Cigarettes  . Smokeless tobacco:  Never Used  . Alcohol Use: Yes     Comment: wine occasionally with supper     Family History History reviewed. No pertinent family history.  Allergies  Allergen Reactions  . Augmentin (Amoxicillin-Pot Clavulanate) Other (See Comments)    unknown    Current Outpatient Prescriptions  Medication Sig Dispense Refill  . albuterol (PROVENTIL HFA;VENTOLIN HFA) 108 (90 BASE) MCG/ACT inhaler Inhale 2 puffs into the lungs every 4 (four) hours as needed. For shortness of breath      . aspirin EC 81 MG tablet Take 81 mg by mouth at bedtime.       Marland Kitchen buPROPion (WELLBUTRIN SR) 150 MG 12 hr tablet Take 150 mg by mouth 3 (three) times daily.       . Calcium-Magnesium-Vitamin D (CALCIUM MAGNESIUM PO) Take 1 tablet by mouth every morning.       . ciprofloxacin (CIPRO) 500 MG tablet Take 500 mg by mouth 2 (two) times daily as needed. Taken if patient has kidney infection.      . gabapentin (NEURONTIN) 300 MG capsule Take 300 mg by mouth 3 (three) times daily.       Marland Kitchen glucosamine-chondroitin 500-400 MG tablet Take 1 tablet by mouth 2 (two) times daily.       Marland Kitchen  hydrophilic ointment Apply 1 application topically as needed. APPLY TO FEET      . ketoconazole (NIZORAL) 2 % shampoo       . methenamine (HIPREX) 1 G tablet Take 1 g by mouth every morning.       . Multiple Vitamin (MULTIVITAMIN WITH MINERALS) TABS Take 1 tablet by mouth every morning.       . nitrofurantoin (MACRODANTIN) 50 MG capsule Take 50 mg by mouth at bedtime.      . selenium sulfide (SELSUN) 2.5 % shampoo       . simvastatin (ZOCOR) 20 MG tablet Take 20 mg by mouth at bedtime.        Marland Kitchen tiotropium (SPIRIVA) 18 MCG inhalation capsule Place 18 mcg into inhaler and inhale at bedtime.        Physical Examination  Filed Vitals:   10/24/12 0952  BP: 134/80  Pulse: 53  Resp: 16    Body mass index is 25.76 kg/(m^2).  General:  WDWN in NAD Gait: Normal HEENT: WNL Eyes: Pupils equal Pulmonary: normal non-labored breathing , without  Rales, rhonchi,  wheezing Cardiac: RRR, without  Murmurs, rubs or gallops; No carotid bruits Abdomen: soft, NT, no masses Skin: no rashes, ulcers noted Vascular Exam/Pulses: Palpable femoral pulses bilaterally, there is a mild pulsatile abdominal mass palpated  Extremities without ischemic changes, no Gangrene , no cellulitis; no open wounds;  Musculoskeletal: no muscle wasting or atrophy  Neurologic: A&O X 3; Appropriate Affect ; SENSATION: normal; MOTOR FUNCTION:  moving all extremities equally. Speech is fluent/normal  Non-Invasive Vascular Imaging: AAA duplex today shows a maximum diameter of 4.6 AP by 4.5 transverse. Previous maximum diameter was 4.7 in May of 2013  ASSESSMENT/PLAN: Asymptomatic AAA patient that will followup in 6 months with repeat AAA duplex. The patient's questions were encouraged and answered, he is in agreement with this plan. The patient knows if he has any sudden onset of abdominal or back pain he is to call 911 immediately.  Lauree Chandler ANP X.  Clinic M.D.: Early

## 2012-10-25 NOTE — Addendum Note (Signed)
Addended by: Sharee Pimple on: 10/25/2012 02:10 PM   Modules accepted: Orders

## 2012-11-01 ENCOUNTER — Other Ambulatory Visit: Payer: Self-pay | Admitting: Urology

## 2012-11-01 DIAGNOSIS — R109 Unspecified abdominal pain: Secondary | ICD-10-CM

## 2012-11-08 ENCOUNTER — Ambulatory Visit
Admission: RE | Admit: 2012-11-08 | Discharge: 2012-11-08 | Disposition: A | Discharge status: Home / Self Care | Payer: Medicare Other | Source: Ambulatory Visit | Attending: Urology | Admitting: Urology

## 2012-11-08 DIAGNOSIS — R109 Unspecified abdominal pain: Secondary | ICD-10-CM

## 2012-11-08 MED ORDER — IOHEXOL 300 MG/ML  SOLN
50.0000 mL | Freq: Once | INTRAMUSCULAR | Status: AC | PRN
  Administered 2012-11-08: 50 mL via INTRAVENOUS

## 2012-11-21 ENCOUNTER — Encounter (HOSPITAL_COMMUNITY): Payer: Self-pay | Admitting: Pharmacy Technician

## 2012-11-26 ENCOUNTER — Other Ambulatory Visit (HOSPITAL_COMMUNITY): Payer: Self-pay | Admitting: Internal Medicine

## 2012-12-01 ENCOUNTER — Other Ambulatory Visit: Payer: Self-pay

## 2012-12-01 ENCOUNTER — Other Ambulatory Visit (HOSPITAL_COMMUNITY): Payer: Self-pay | Admitting: *Deleted

## 2012-12-01 ENCOUNTER — Ambulatory Visit (HOSPITAL_COMMUNITY)
Admission: RE | Admit: 2012-12-01 | Discharge: 2012-12-01 | Disposition: A | Discharge status: Home / Self Care | Payer: Medicare Other | Source: Ambulatory Visit | Attending: Urology | Admitting: Urology

## 2012-12-01 DIAGNOSIS — N1330 Unspecified hydronephrosis: Secondary | ICD-10-CM | POA: Insufficient documentation

## 2012-12-01 MED ORDER — IOHEXOL 300 MG/ML  SOLN: 15.0000 mL | Freq: Once | INTRAMUSCULAR | Status: AC | PRN

## 2013-01-12 ENCOUNTER — Ambulatory Visit (HOSPITAL_COMMUNITY): Payer: Medicare Other

## 2013-01-15 ENCOUNTER — Other Ambulatory Visit (HOSPITAL_COMMUNITY): Payer: Self-pay | Admitting: *Deleted

## 2013-01-15 ENCOUNTER — Ambulatory Visit (HOSPITAL_COMMUNITY)
Admission: RE | Admit: 2013-01-15 | Discharge: 2013-01-15 | Disposition: A | Discharge status: Home / Self Care | Payer: Medicare Other | Source: Ambulatory Visit | Attending: Urology | Admitting: Urology

## 2013-01-15 ENCOUNTER — Other Ambulatory Visit (HOSPITAL_COMMUNITY): Payer: Self-pay | Admitting: Urology

## 2013-01-15 ENCOUNTER — Encounter (HOSPITAL_COMMUNITY): Payer: Self-pay | Admitting: Pharmacy Technician

## 2013-01-15 DIAGNOSIS — N1330 Unspecified hydronephrosis: Secondary | ICD-10-CM | POA: Insufficient documentation

## 2013-01-15 DIAGNOSIS — Z436 Encounter for attention to other artificial openings of urinary tract: Secondary | ICD-10-CM | POA: Insufficient documentation

## 2013-01-15 MED ORDER — IOHEXOL 300 MG/ML  SOLN
10.0000 mL | Freq: Once | INTRAMUSCULAR | Status: AC | PRN
  Administered 2013-01-15: 10 mL

## 2013-01-15 NOTE — Procedures (Signed)
Routine exchange of right nephrostomy tube.  No immediate complication.

## 2013-01-21 ENCOUNTER — Encounter (HOSPITAL_COMMUNITY): Payer: Self-pay

## 2013-01-21 ENCOUNTER — Emergency Department (HOSPITAL_COMMUNITY): Payer: Medicare Other

## 2013-01-21 ENCOUNTER — Inpatient Hospital Stay (HOSPITAL_COMMUNITY)
Admission: EM | Admit: 2013-01-21 | Discharge: 2013-01-24 | DRG: 661 | Disposition: A | Discharge status: Home Health | Payer: Medicare Other | Attending: Urology | Admitting: Urology

## 2013-01-21 DIAGNOSIS — N1330 Unspecified hydronephrosis: Principal | ICD-10-CM | POA: Diagnosis present

## 2013-01-21 DIAGNOSIS — B961 Klebsiella pneumoniae [K. pneumoniae] as the cause of diseases classified elsewhere: Secondary | ICD-10-CM | POA: Diagnosis present

## 2013-01-21 DIAGNOSIS — F3289 Other specified depressive episodes: Secondary | ICD-10-CM | POA: Diagnosis present

## 2013-01-21 DIAGNOSIS — N390 Urinary tract infection, site not specified: Secondary | ICD-10-CM | POA: Diagnosis present

## 2013-01-21 DIAGNOSIS — Z933 Colostomy status: Secondary | ICD-10-CM

## 2013-01-21 DIAGNOSIS — J4489 Other specified chronic obstructive pulmonary disease: Secondary | ICD-10-CM | POA: Diagnosis present

## 2013-01-21 DIAGNOSIS — Z79899 Other long term (current) drug therapy: Secondary | ICD-10-CM

## 2013-01-21 DIAGNOSIS — Z87891 Personal history of nicotine dependence: Secondary | ICD-10-CM

## 2013-01-21 DIAGNOSIS — I714 Abdominal aortic aneurysm, without rupture, unspecified: Secondary | ICD-10-CM | POA: Diagnosis present

## 2013-01-21 LAB — URINALYSIS, ROUTINE W REFLEX MICROSCOPIC
Glucose, UA: NEGATIVE mg/dL
pH: 7 (ref 5.0–8.0)

## 2013-01-21 LAB — CBC WITH DIFFERENTIAL/PLATELET
Basophils Absolute: 0 10*3/uL (ref 0.0–0.1)
HCT: 39.4 % (ref 39.0–52.0)
Lymphocytes Relative: 17 % (ref 12–46)
Lymphs Abs: 1.1 10*3/uL (ref 0.7–4.0)
Monocytes Absolute: 0.9 10*3/uL (ref 0.1–1.0)
Neutro Abs: 4.5 10*3/uL (ref 1.7–7.7)
Platelets: 180 10*3/uL (ref 150–400)
RBC: 4.34 MIL/uL (ref 4.22–5.81)
RDW: 13.7 % (ref 11.5–15.5)
WBC: 6.5 10*3/uL (ref 4.0–10.5)

## 2013-01-21 LAB — BASIC METABOLIC PANEL
CO2: 26 mEq/L (ref 19–32)
Chloride: 99 mEq/L (ref 96–112)
Glucose, Bld: 105 mg/dL — ABNORMAL HIGH (ref 70–99)
Sodium: 136 mEq/L (ref 135–145)

## 2013-01-21 MED ORDER — HYDROMORPHONE HCL PF 1 MG/ML IJ SOLN
1.0000 mg | Freq: Once | INTRAMUSCULAR | Status: AC
  Administered 2013-01-21: 1 mg via INTRAVENOUS
  Filled 2013-01-21: qty 1

## 2013-01-21 MED ORDER — CIPROFLOXACIN IN D5W 400 MG/200ML IV SOLN
400.0000 mg | Freq: Once | INTRAVENOUS | Status: AC
  Administered 2013-01-21: 400 mg via INTRAVENOUS
  Filled 2013-01-21: qty 200

## 2013-01-21 MED ORDER — ONDANSETRON HCL 4 MG/2ML IJ SOLN
INTRAMUSCULAR | Status: AC
  Filled 2013-01-21: qty 2

## 2013-01-21 MED ORDER — ONDANSETRON HCL 4 MG/2ML IJ SOLN
4.0000 mg | Freq: Once | INTRAMUSCULAR | Status: AC
  Administered 2013-01-21: 4 mg via INTRAVENOUS

## 2013-01-21 MED ORDER — SODIUM CHLORIDE 0.9 % IV SOLN
INTRAVENOUS | Status: DC
  Administered 2013-01-21 (×2): via INTRAVENOUS

## 2013-01-21 MED ORDER — ONDANSETRON HCL 4 MG/2ML IJ SOLN
4.0000 mg | Freq: Once | INTRAMUSCULAR | Status: AC
  Administered 2013-01-21: 4 mg via INTRAVENOUS
  Filled 2013-01-21: qty 2

## 2013-01-21 NOTE — ED Notes (Addendum)
Patient suddenly had pain today. Patient stated that nephrostomy tube that was placed on 2/17 came out in his urostomy bag. Urostomy stent placed last year in July.

## 2013-01-21 NOTE — ED Notes (Signed)
MD at bedside.Informed pt of plan to admit.

## 2013-01-21 NOTE — ED Notes (Signed)
Dr. Brunilda Payor paged regarding delay in bed request and admission orders.

## 2013-01-21 NOTE — H&P (Signed)
History and Physical  Chief Complaint: Right flank pain  History of Present Illness: The patient is a 79 years old male, patient of Dr Logan Bores' who had a right nephroureteral stent exchange on 2/17.  The stent came out this morning around 7 o'oclock.  He did well until about noon when he started having right flank pain.  The pain gradually worsened and was severe this afternoon and associated with nausea and vomiting.  He called radiology and spoke with Dr Fredia Sorrow who advised him to go to the ER.  Renal ultrasound showed marked right hydronephrosis.  His creatinine is up: 1.81He is admitted for right percutaneous nephrostomy in AM.  The patient has a complicated urologic history.  He had seeds implantation in the past for adenocarcinoma of prostate.  He subsequently developed a fistula that was managed with urinary diversion and colostomy.  He has been doing well since.  He had a right nephroureteral stent inserted last July and the stent has been replaced about 3 times since.  He feels more comfortable at this time but still has mild to moderate right flank discomfort.   Past Medical History  Diagnosis Date  . History of colostomy   . Urostomy stenosis     Ask patient to clarify, per history form dated 04/02/11.  Marland Kitchen Peristomal hernia   . Prostate cancer   . Skin cancer   . Neuropathy     in feet   . COPD (chronic obstructive pulmonary disease)   . Depression   . UTI (lower urinary tract infection)     history   . AAA (abdominal aortic aneurysm)    Past Surgical History  Procedure Laterality Date  . Cystectomy, ileal coduit, colostomy  2002    for severe rectourethral fistula  . Ex lap, lysis of adhesions  2010    for SBO  . Lap repair of parastomal hernias  2009    Dr. Barnetta Chapel [facility],  Hematoma evac POD#1    Medications: Currently on Cipro.  Albuterol, Wellbutrin, Calcium-magnesium-Vit D. Gabapentin, glucosamine, ketoconazole, Hiprex, selenium, simvastatin, Spiriva, Vit C. Allergies:   Allergies  Allergen Reactions  . Augmentin (Amoxicillin-Pot Clavulanate) Other (See Comments)    Reaction unknown.    No family history on file. Social History:  reports that he has quit smoking. His smoking use included Cigarettes. He smoked 0.00 packs per day for 37 years. He has never used smokeless tobacco. He reports that  drinks alcohol. He reports that he does not use illicit drugs.  ROS: All systems are reviewed and negative except as noted.   Physical Exam:  Vital signs in last 24 hours: Temp:  [98.3 F (36.8 C)] 98.3 F (36.8 C) (02/23 1710) Pulse Rate:  [50-69] 50 (02/23 2200) Resp:  [14-21] 20 (02/23 2200) BP: (125-147)/(44-98) 144/70 mmHg (02/23 2200) SpO2:  [90 %-100 %] 93 % (02/23 2200) Head: Normal. Pupils: equal and reactive to light ENT: Within normal limits.  No cervical adenopathy.  No thyromegaly. Cardiovascular: Skin warm; not flushed Respiratory: Breaths quiet; no shortness of breath Abdomen: No masses.  He has parastomal hernia..  Ileal loop and colostomy are functioning well Neurological: Normal sensation to touch Musculoskeletal: Normal motor function arms and legs Lymphatics: No inguinal adenopathy Skin: No rashes Genitourinary: Penis is uncircumcised.  Scrotum is normal in appearance.                         Cords and epididymides are within normal limits Laboratory Data:  Results for orders placed during the hospital encounter of 01/21/13 (from the past 24 hour(s))  CBC WITH DIFFERENTIAL     Status: Abnormal   Collection Time    01/21/13  4:50 PM      Result Value Range   WBC 6.5  4.0 - 10.5 K/uL   RBC 4.34  4.22 - 5.81 MIL/uL   Hemoglobin 13.6  13.0 - 17.0 g/dL   HCT 78.2  95.6 - 21.3 %   MCV 90.8  78.0 - 100.0 fL   MCH 31.3  26.0 - 34.0 pg   MCHC 34.5  30.0 - 36.0 g/dL   RDW 08.6  57.8 - 46.9 %   Platelets 180  150 - 400 K/uL   Neutrophils Relative 69  43 - 77 %   Neutro Abs 4.5  1.7 - 7.7 K/uL   Lymphocytes Relative 17  12 - 46 %    Lymphs Abs 1.1  0.7 - 4.0 K/uL   Monocytes Relative 13 (*) 3 - 12 %   Monocytes Absolute 0.9  0.1 - 1.0 K/uL   Eosinophils Relative 1  0 - 5 %   Eosinophils Absolute 0.0  0.0 - 0.7 K/uL   Basophils Relative 0  0 - 1 %   Basophils Absolute 0.0  0.0 - 0.1 K/uL  BASIC METABOLIC PANEL     Status: Abnormal   Collection Time    01/21/13  4:50 PM      Result Value Range   Sodium 136  135 - 145 mEq/L   Potassium 4.5  3.5 - 5.1 mEq/L   Chloride 99  96 - 112 mEq/L   CO2 26  19 - 32 mEq/L   Glucose, Bld 105 (*) 70 - 99 mg/dL   BUN 30 (*) 6 - 23 mg/dL   Creatinine, Ser 6.29 (*) 0.50 - 1.35 mg/dL   Calcium 9.1  8.4 - 52.8 mg/dL   GFR calc non Af Amer 34 (*) >90 mL/min   GFR calc Af Amer 39 (*) >90 mL/min  URINALYSIS, ROUTINE W REFLEX MICROSCOPIC     Status: Abnormal   Collection Time    01/21/13  5:00 PM      Result Value Range   Color, Urine YELLOW  YELLOW   APPearance TURBID (*) CLEAR   Specific Gravity, Urine 1.020  1.005 - 1.030   pH 7.0  5.0 - 8.0   Glucose, UA NEGATIVE  NEGATIVE mg/dL   Hgb urine dipstick LARGE (*) NEGATIVE   Bilirubin Urine NEGATIVE  NEGATIVE   Ketones, ur NEGATIVE  NEGATIVE mg/dL   Protein, ur 413 (*) NEGATIVE mg/dL   Urobilinogen, UA 0.2  0.0 - 1.0 mg/dL   Nitrite NEGATIVE  NEGATIVE   Leukocytes, UA LARGE (*) NEGATIVE  URINE MICROSCOPIC-ADD ON     Status: None   Collection Time    01/21/13  5:00 PM      Result Value Range   WBC, UA TOO NUMEROUS TO COUNT  <3 WBC/hpf   RBC / HPF FIELD OBSCURED BY WBC'S  <3 RBC/hpf   No results found for this or any previous visit (from the past 240 hour(s)). Creatinine:  Recent Labs  01/21/13 1650  CREATININE 1.81*      Impression/Assessment:  Right hydronephrosis.  Renal insufficiency. H/o prostate cancer.  S/P ileal conduit and colostomy.  Plan:  Admit for observation.  Right percutaneous nephrostomy in AM.  Adams Hinch-HENRY 01/21/2013, 10:06 PM

## 2013-01-21 NOTE — ED Provider Notes (Signed)
History     CSN: [id number]  Arrival date & time 01/21/13  1624   First MD Initiated Contact with Patient 01/21/13 1627      Chief Complaint  Patient presents with  . Abdominal Pain    (Consider location/radiation/quality/duration/timing/severity/associated sxs/prior treatment) Patient is a 79 y.o. male presenting with abdominal pain. The history is provided by the patient and the spouse.  Abdominal Pain  patient came in today because of pain in his right flank area. He has a nephrostomy tube that was placed on every 17th. This tube was placed every 6 weeks due to a ureteral stricture. After taking a shower today, he noted that the tube had become dislodged out of his ileostomy bag. He has had severe pain since this happened this morning. Some nausea without vomiting. Patient called his Dr. was told to come in here. No medications used prior to arrival.  Past Medical History  Diagnosis Date  . History of colostomy   . Urostomy stenosis     Ask patient to clarify, per history form dated 04/02/11.  Marland Kitchen Peristomal hernia   . Prostate cancer   . Skin cancer   . Neuropathy     in feet   . COPD (chronic obstructive pulmonary disease)   . Depression   . UTI (lower urinary tract infection)     history   . AAA (abdominal aortic aneurysm)     Past Surgical History  Procedure Laterality Date  . Cystectomy, ileal coduit, colostomy  2002    for severe rectourethral fistula  . Ex lap, lysis of adhesions  2010    for SBO  . Lap repair of parastomal hernias  2009    Dr. Barnetta Chapel [facility],  Hematoma evac POD#1    No family history on file.  History  Substance Use Topics  . Smoking status: Former Smoker -- 37 years    Types: Cigarettes  . Smokeless tobacco: Never Used  . Alcohol Use: Yes     Comment: wine occasionally with supper       Review of Systems  Gastrointestinal: Positive for abdominal pain.  All other systems reviewed and are negative.    Allergies  Augmentin  Home  Medications   Current Outpatient Rx  Name  Route  Sig  Dispense  Refill  . acetaminophen (TYLENOL) 500 MG tablet   Oral   Take 1,000 mg by mouth every 6 (six) hours as needed. For pain         . albuterol (PROVENTIL HFA;VENTOLIN HFA) 108 (90 BASE) MCG/ACT inhaler   Inhalation   Inhale 2 puffs into the lungs every 4 (four) hours as needed. For shortness of breath         . aspirin EC 81 MG tablet   Oral   Take 81 mg by mouth at bedtime.          Marland Kitchen buPROPion (WELLBUTRIN SR) 150 MG 12 hr tablet   Oral   Take 150 mg by mouth 2 (two) times daily.          . Calcium-Magnesium-Vitamin D (CALCIUM MAGNESIUM PO)   Oral   Take 1 tablet by mouth every morning.          . ciprofloxacin (CIPRO) 500 MG tablet   Oral   Take 500 mg by mouth 2 (two) times daily as needed. Taken if patient has kidney infection. Alternating with Hiprex.         . gabapentin (NEURONTIN) 300 MG capsule  Oral   Take 300 mg by mouth 3 (three) times daily.          Marland Kitchen glucosamine-chondroitin 500-400 MG tablet   Oral   Take 1 tablet by mouth 2 (two) times daily.          . hydrophilic ointment   Topical   Apply 1 application topically daily as needed. Applies to feet.         Marland Kitchen ketoconazole (NIZORAL) 2 % shampoo   Topical   Apply 1 application topically 2 (two) times a week.          . methenamine (HIPREX) 1 G tablet   Oral   Take 1 g by mouth daily as needed. Taken if patient has kidney infection. Alternating with Cipro.         . Multiple Vitamin (MULTIVITAMIN WITH MINERALS) TABS   Oral   Take 1 tablet by mouth every morning.          . nitrofurantoin (MACRODANTIN) 50 MG capsule   Oral   Take 50 mg by mouth at bedtime.         . selenium sulfide (SELSUN) 2.5 % shampoo   Topical   Apply 1 application topically daily as needed (Applies to scalp for dandruff or itching.).          Marland Kitchen simvastatin (ZOCOR) 20 MG tablet   Oral   Take 20 mg by mouth at bedtime.           .  sodium chloride (OCEAN) 0.65 % nasal spray   Nasal   Place 1 spray into the nose daily as needed. For nasal decongestant         . sorbitol 70 % solution   Oral   Take 15-30 mLs by mouth daily as needed. For constipation         . tiotropium (SPIRIVA) 18 MCG inhalation capsule   Inhalation   Place 18 mcg into inhaler and inhale at bedtime.           There were no vitals taken for this visit.  Physical Exam  Nursing note and vitals reviewed. Constitutional: He is oriented to person, place, and time. He appears well-developed and well-nourished.  Non-toxic appearance. No distress.  HENT:  Head: Normocephalic and atraumatic.  Eyes: Conjunctivae, EOM and lids are normal. Pupils are equal, round, and reactive to light.  Neck: Normal range of motion. Neck supple. No tracheal deviation present. No mass present.  Cardiovascular: Normal rate, regular rhythm and normal heart sounds.  Exam reveals no gallop.   No murmur heard. Pulmonary/Chest: Effort normal and breath sounds normal. No stridor. No respiratory distress. He has no decreased breath sounds. He has no wheezes. He has no rhonchi. He has no rales.  Abdominal: Soft. Normal appearance and bowel sounds are normal. He exhibits no distension. There is no tenderness. There is no rigidity, no rebound, no guarding and no CVA tenderness.    Musculoskeletal: Normal range of motion. He exhibits no edema and no tenderness.  Neurological: He is alert and oriented to person, place, and time. He has normal strength. No cranial nerve deficit or sensory deficit. GCS eye subscore is 4. GCS verbal subscore is 5. GCS motor subscore is 6.  Skin: Skin is warm and dry. No abrasion and no rash noted.  Psychiatric: He has a normal mood and affect. His speech is normal and behavior is normal.    ED Course  Procedures (including critical care time)  Labs  Reviewed  CBC WITH DIFFERENTIAL  BASIC METABOLIC PANEL   No results found.   No diagnosis  found.    MDM   Pt given meds for pain and abx for uti--spoke with IR, Dr. Fredia Sorrow, recc urology consult, spoke with dr. Brunilda Payor, will come to admit        Toy Baker, MD 01/21/13 2052

## 2013-01-21 NOTE — ED Notes (Signed)
MD at bedside.Admitting provider.

## 2013-01-22 ENCOUNTER — Encounter (HOSPITAL_COMMUNITY): Payer: Self-pay

## 2013-01-22 ENCOUNTER — Observation Stay (HOSPITAL_COMMUNITY): Payer: Medicare Other

## 2013-01-22 MED ORDER — LIDOCAINE HCL 1 % IJ SOLN
INTRAMUSCULAR | Status: AC
  Filled 2013-01-22: qty 20

## 2013-01-22 MED ORDER — ACETAMINOPHEN 325 MG PO TABS
650.0000 mg | ORAL_TABLET | ORAL | Status: DC | PRN
  Administered 2013-01-22: 325 mg via ORAL
  Filled 2013-01-22: qty 1

## 2013-01-22 MED ORDER — CIPROFLOXACIN IN D5W 400 MG/200ML IV SOLN
400.0000 mg | Freq: Once | INTRAVENOUS | Status: AC
  Administered 2013-01-22: 400 mg via INTRAVENOUS
  Filled 2013-01-22: qty 200

## 2013-01-22 MED ORDER — FENTANYL CITRATE 0.05 MG/ML IJ SOLN
INTRAMUSCULAR | Status: AC | PRN
  Administered 2013-01-22 (×3): 50 ug via INTRAVENOUS

## 2013-01-22 MED ORDER — IOHEXOL 300 MG/ML  SOLN
50.0000 mL | Freq: Once | INTRAMUSCULAR | Status: AC | PRN
  Administered 2013-01-22: 25 mL

## 2013-01-22 MED ORDER — ONDANSETRON HCL 4 MG/2ML IJ SOLN
4.0000 mg | INTRAMUSCULAR | Status: DC | PRN
  Administered 2013-01-23: 4 mg via INTRAVENOUS
  Filled 2013-01-22: qty 2

## 2013-01-22 MED ORDER — ZOLPIDEM TARTRATE 5 MG PO TABS
5.0000 mg | ORAL_TABLET | Freq: Every evening | ORAL | Status: DC | PRN
  Administered 2013-01-22: 5 mg via ORAL
  Filled 2013-01-22: qty 1

## 2013-01-22 MED ORDER — HYDROCODONE-ACETAMINOPHEN 5-325 MG PO TABS
1.0000 | ORAL_TABLET | ORAL | Status: DC | PRN
  Administered 2013-01-22 – 2013-01-23 (×2): 1 via ORAL
  Administered 2013-01-23: 2 via ORAL
  Administered 2013-01-24: 1 via ORAL
  Filled 2013-01-22 (×3): qty 1
  Filled 2013-01-22: qty 2

## 2013-01-22 MED ORDER — VANCOMYCIN HCL 10 G IV SOLR
1250.0000 mg | INTRAVENOUS | Status: DC
  Administered 2013-01-22: 1250 mg via INTRAVENOUS
  Filled 2013-01-22: qty 1250

## 2013-01-22 MED ORDER — MIDAZOLAM HCL 2 MG/2ML IJ SOLN
INTRAMUSCULAR | Status: AC | PRN
  Administered 2013-01-22 (×2): 1 mg via INTRAVENOUS

## 2013-01-22 MED ORDER — HYDROMORPHONE HCL PF 1 MG/ML IJ SOLN
0.5000 mg | INTRAMUSCULAR | Status: DC | PRN
  Administered 2013-01-22: 1 mg via INTRAVENOUS
  Filled 2013-01-22: qty 1

## 2013-01-22 MED ORDER — DEXTROSE-NACL 5-0.45 % IV SOLN
INTRAVENOUS | Status: DC
  Administered 2013-01-22 – 2013-01-24 (×3): via INTRAVENOUS

## 2013-01-22 NOTE — Progress Notes (Signed)
Patient received from IR; transfer from 5W.  Agree with previous RN's assessment.  Patient VSS, patient is resting comfortably in the bed, alert and oriented after the procedure.  Patient now has a foley drainage bag attached to his urostomy draining bloody urine.  Will continue to monitor and document changes in assessment as needed.

## 2013-01-22 NOTE — Progress Notes (Addendum)
ANTIBIOTIC CONSULT NOTE - INITIAL  Pharmacy Consult for Vancomycin Indication: possible infection  Allergies  Allergen Reactions  . Augmentin (Amoxicillin-Pot Clavulanate) Other (See Comments)    Reaction unknown.    Patient Measurements: Height: 6\' 2"  (188 cm) Weight: 202 lb 9.6 oz (91.899 kg) IBW/kg (Calculated) : 82.2  Vital Signs: Temp: 102.3 F (39.1 C) (02/24 2145) Temp src: Oral (02/24 2145) BP: 123/60 mmHg (02/24 2145) Pulse Rate: 71 (02/24 2145) Intake/Output from previous day: 02/23 0701 - 02/24 0700 In: 1182.3 [I.V.:982.3; IV Piggyback:200] Out: 2200 [Urine:2200] Intake/Output from this shift: Total I/O In: 150 [P.O.:150] Out: 300 [Urine:300]  Labs:  Recent Labs  01/21/13 1650  WBC 6.5  HGB 13.6  PLT 180  CREATININE 1.81*   Estimated Creatinine Clearance: 37.8 ml/min (by C-G formula based on Cr of 1.81). No results found for this basename: VANCOTROUGH, VANCOPEAK, VANCORANDOM, GENTTROUGH, GENTPEAK, GENTRANDOM, TOBRATROUGH, TOBRAPEAK, TOBRARND, AMIKACINPEAK, AMIKACINTROU, AMIKACIN,  in the last 72 hours   Microbiology: No results found for this or any previous visit (from the past 720 hour(s)).  Medical History: Past Medical History  Diagnosis Date  . History of colostomy   . Urostomy stenosis     Ask patient to clarify, per history form dated 04/02/11.  Marland Kitchen Peristomal hernia   . Prostate cancer   . Skin cancer   . Neuropathy     in feet   . COPD (chronic obstructive pulmonary disease)   . Depression   . UTI (lower urinary tract infection)     history   . AAA (abdominal aortic aneurysm)     Medications:  Scheduled:  . [COMPLETED] ciprofloxacin  400 mg Intravenous Once  . [COMPLETED] ciprofloxacin  400 mg Intravenous Once  . ciprofloxacin  400 mg Intravenous Once  . [EXPIRED] lidocaine       Infusions:  . sodium chloride 100 mL/hr at 01/21/13 2113  . dextrose 5 % and 0.45% NaCl 100 mL/hr at 01/22/13 1945   PRN: acetaminophen, [COMPLETED]  fentaNYL, HYDROcodone-acetaminophen, HYDROmorphone (DILAUDID) injection, [COMPLETED] iohexol, [COMPLETED] midazolam, ondansetron, zolpidem Assessment: 79 yo M who has spiked a temp of 102.3 today. Had right nephro ureteral catheter inserted 2/24. Patient S/P ileoconduit and a colostomy for urethrorectal fistula.   Goal of Therapy:  Vancomycin trough level 15-20 mcg/ml  Plan:  1. ) Begin Vancomycin 1250mg  IV q 24 hours. Measure antibiotic drug levels at steady state Follow up culture results  Loletta Specter 01/22/2013,10:08 PM

## 2013-01-22 NOTE — Progress Notes (Signed)
Subjective: Patient reports : Feels fine at this time.  Had right nephro ureteral catheter inserted this morning. Ileal loop draining slightly bloody urine  Objective: Vital signs in last 24 hours: Temp:  [98.1 F (36.7 C)-98.8 F (37.1 C)] 98.2 F (36.8 C) (02/24 1041) Pulse Rate:  [46-69] 47 (02/24 1041) Resp:  [11-21] 12 (02/24 1041) BP: (105-147)/(44-98) 105/58 mmHg (02/24 1041) SpO2:  [90 %-100 %] 94 % (02/24 1041) Weight:  [202 lb 9.6 oz (91.899 kg)] 202 lb 9.6 oz (91.899 kg) (02/24 0013)  Intake/Output from previous day: 02/23 0701 - 02/24 0700 In: 1182.3 [I.V.:982.3; IV Piggyback:200] Out: 2200 [Urine:2200] Intake/Output this shift: Total I/O In: -  Out: 850 [Urine:850]  Physical Exam:  General:Alert and oriented. Lungs - Normal respiratory effort, chest expands symmetrically.  Abdomen - Soft, non-tender & non-distended Urine slightly bloody  Lab Results:  Recent Labs  01/21/13 1650  HGB 13.6  HCT 39.4   BMET  Recent Labs  01/21/13 1650  NA 136  K 4.5  CL 99  CO2 26  GLUCOSE 105*  BUN 30*  CREATININE 1.81*  CALCIUM 9.1    Recent Labs  01/22/13 0055  INR 1.10   No results found for this basename: LABURIN,  in the last 72 hours Results for orders placed during the hospital encounter of 06/21/12  URINE CULTURE     Status: None   Collection Time    06/21/12  6:57 PM      Result Value Range Status   Specimen Description URINE, CLEAN CATCH   Final   Special Requests NONE   Final   Culture  Setup Time 06/22/2012 03:21   Final   Colony Count NO GROWTH   Final   Culture NO GROWTH   Final   Report Status 06/22/2012 FINAL   Final  BODY FLUID CULTURE     Status: None   Collection Time    06/22/12  3:35 PM      Result Value Range Status   Specimen Description KIDNEY URINE   Final   Special Requests NONE   Final   Gram Stain     Final   Value: MODERATE WBC PRESENT,BOTH PMN AND MONONUCLEAR     NO ORGANISMS SEEN   Culture FEW ESCHERICHIA COLI    Final   Report Status 06/25/2012 FINAL   Final   Organism ID, Bacteria ESCHERICHIA COLI   Final  CULTURE, BLOOD (ROUTINE X 2)     Status: None   Collection Time    06/22/12  8:15 PM      Result Value Range Status   Specimen Description BLOOD RIGHT ARM   Final   Special Requests BOTTLES DRAWN AEROBIC AND ANAEROBIC 10CC   Final   Culture  Setup Time 06/23/2012 01:34   Final   Culture NO GROWTH 5 DAYS   Final   Report Status 06/29/2012 FINAL   Final  CULTURE, BLOOD (ROUTINE X 2)     Status: None   Collection Time    06/22/12  8:20 PM      Result Value Range Status   Specimen Description BLOOD RIGHT ARM   Final   Special Requests BOTTLES DRAWN AEROBIC AND ANAEROBIC 10CC   Final   Culture  Setup Time 06/23/2012 01:35   Final   Culture NO GROWTH 5 DAYS   Final   Report Status 06/29/2012 FINAL   Final    Studies/Results: US Renal  01/21/2013  *RADIOLOGY REPORT*  Clinical Data: Bilateral flank  pain.  RENAL/URINARY TRACT ULTRASOUND COMPLETE  Comparison:  CT scan 11/08/2012.  Findings:  Right Kidney:  15.0 cm in length. Mild renal cortical thinning and scarring changes.  Normal echogenicity.  Marked hydronephrosis.  Left Kidney:  12 cm in length.  Normal renal cortical thickness and echogenicity.  Minimal hydronephrosis.  Bladder:  Status post cystectomy.  IMPRESSION:  Marked right-sided hydronephrosis and minimal left-sided hydronephrosis.   Original Report Authenticated By: Rudie Meyer, M.D.     Assessment/Plan:  Right hydronephrosis. H/o prostate cancer. 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  Probable discharge in AM.   LOS: 1 day   Melvin Whiteford-HENRY 01/22/2013, 12:39 PM

## 2013-01-22 NOTE — Progress Notes (Signed)
Patient has informed me that he used super glue to glue the tubing of the standard drainage leg bag to the opening of his urostomy bag to make it stay put.  Patient is alert and oriented and states he has done this for 12 years at home and that 'it is okay and works well'.  Patient educated that he should make the rounding MD aware of this issue.  Urostomy is draining into standard drainage leg bag.  Will continue to monitor patient.

## 2013-01-22 NOTE — Procedures (Signed)
Retrograde 44F nephroureteral via R perc neph under [place] No complication No blood loss. See complete dictation in [facility].

## 2013-01-23 LAB — BASIC METABOLIC PANEL
CO2: 25 mEq/L (ref 19–32)
Calcium: 8.6 mg/dL (ref 8.4–10.5)
Creatinine, Ser: 1.55 mg/dL — ABNORMAL HIGH (ref 0.50–1.35)
Glucose, Bld: 115 mg/dL — ABNORMAL HIGH (ref 70–99)

## 2013-01-23 LAB — URINE CULTURE: Colony Count: >=100000

## 2013-01-23 LAB — CBC
Hemoglobin: 13.3 g/dL (ref 13.0–17.0)
MCH: 31.5 pg (ref 26.0–34.0)
MCV: 91.2 fL (ref 78.0–100.0)
RBC: 4.22 MIL/uL (ref 4.22–5.81)

## 2013-01-23 MED ORDER — CIPROFLOXACIN IN D5W 400 MG/200ML IV SOLN
400.0000 mg | Freq: Two times a day (BID) | INTRAVENOUS | Status: DC
  Administered 2013-01-23 – 2013-01-24 (×2): 400 mg via INTRAVENOUS
  Filled 2013-01-23 (×4): qty 200

## 2013-01-23 MED ORDER — DEXTROSE-NACL 5-0.45 % IV SOLN
INTRAVENOUS | Status: DC
  Administered 2013-01-23: 14:00:00 via INTRAVENOUS

## 2013-01-23 MED ORDER — SODIUM CHLORIDE 0.9 % IV SOLN
1250.0000 mg | INTRAVENOUS | Status: DC
  Filled 2013-01-23: qty 1250

## 2013-01-23 NOTE — Progress Notes (Addendum)
Patient spiked temperature to 102.3 last night.  Will cancel discharge. D/C Vancomycin.  Start Cipro. If he remains afebrile today will discharge in AM.

## 2013-01-23 NOTE — Discharge Summary (Signed)
Patient ID: Michael Novak MRN: [id number] DOB/AGE: 77-May-1931 77 y.o.  Admit date: 01/21/2013 Discharge date: 01/23/2013  Admission Diagnoses: Hydronephrosis [591] UTI (lower urinary tract infection) [599.0]  Discharge Diagnoses:  Right hydronephrosis.  Renal insufficiency.  H/o prostate cancer  Discharged Condition:  Improved  Hospital Course:Michael Novak was admitted on 2/23 with right sided abdominal pain, nausea, vomiting.  The right  nephroureteral catheter came out Sunday morning.  He then started having increasing flank pain.  He was advised to go to the ER.  Renal ultrasound showed right hydronephrosis.  Creatinine increased to 1.81.  The nephroureteral catheter was replaced percutaneously on 2/24.  He does not have any more flank pain.  He tolerates his diet well.  The ileal loop drains clear urine.  Consults: Interventional Radiology  Significant Diagnostic Studies: US Renal  01/21/2013  *RADIOLOGY REPORT*  Clinical Data: Bilateral flank pain.  RENAL/URINARY TRACT ULTRASOUND COMPLETE  Comparison:  CT scan 11/08/2012.  Findings:  Right Kidney:  15.0 cm in length. Mild renal cortical thinning and scarring changes.  Normal echogenicity.  Marked hydronephrosis.  Left Kidney:  12 cm in length.  Normal renal cortical thickness and echogenicity.  Minimal hydronephrosis.  Bladder:  Status post cystectomy.  IMPRESSION:  Marked right-sided hydronephrosis and minimal left-sided hydronephrosis.   Original Report Authenticated By: Michael Novak, M.D.    Ir Nephrostogram Right  01/22/2013  *RADIOLOGY REPORT*  Clinical Data:   ileal conduit with inadvertent removal of retrograde nephro-ureteral catheter, new right hydronephrosis.  PERC NEPHROSTOMY*R* UNDER ULTRASOUND RETROGRADE NEPHRO-URETERAL CATHETER   PLACEMENT UNDER FLUOROSCOPY  Technique: The procedure, risks (including but not limited to bleeding, infection, organ damage), benefits, and alternatives were explained to the patient.  Questions  regarding the procedure were encouraged and answered.  The patient understands and consents to the procedure.The ileostomy and rightflank region prepped with Betadine, draped in usual sterile fashion, infiltrated locally with 1% lidocaine.  As antibiotic prophylaxis, Cipro 400 mg was ordered pre-procedure and administered intravenously within one hour of incision.  Intravenous Fentanyl and Versed were administered as conscious sedation during continuous cardiorespiratory monitoring by the radiology RN, with a total moderate sedation time of 25 minutes.  Initially, a retrograde approach was selected.  I passed a short 5- [place] Kumpe catheter through the ostomy into the ileal conduit. With the aid of an angled glide wire, and small contrast injection, using various fluoro angulations, I attempted to find and negotiate the right ureteral anastomosis.  A patent distal left ureteral anastomosis was noted.  However, the right distal ureter cannot be cannulated.  A small false passage was made.  Attention was then turned to the antegrade approach. Under real-time ultrasound guidance, a 21-gauge trocar needle was advanced into a posterior right lower pole calyx. Ultrasound image documentation was saved. Urine spontaneously returned through the needle. Needle was exchanged over a guidewire for transitional dilator. Contrast injection confirmed appropriate positioning. Catheter was exchanged over a guidewire for a a 5-French Kumpe catheter, advanced with the aid of an angled glide wire across the distal ureteral stenosis and through the ileal conduit, the guide wire was exchanged for an Amplatz wire.  A 10-French long pigtail catheter was then advanced retrograde, formed in the central aspect of the right renal collecting system and left in place as retrograde nephro-ureteral drainage catheter.  Percutaneous access to the kidney was removed. Contrast injection confirms appropriate positioning and patency. The ileal conduit  drainage bag was read placed. The patient tolerated the procedure well.  No  immediate complication.  IMPRESSION 1.  Distal right ureteral stenosis could not be crossed retrograde. 2.  Technically successful right percutaneous nephrostomy access to allow guide wire passage across the right ureteral stenosis and ileal conduit.  3.  Technically successful retrograde right nephro-ureteral catheter replacement.   Original Report Authenticated By: D. Andria Rhein, MD    Ir Perc Nephrostomy Right  01/22/2013  *RADIOLOGY REPORT*  Clinical Data:   ileal conduit with inadvertent removal of retrograde nephro-ureteral catheter, new right hydronephrosis.  PERC NEPHROSTOMY*R* UNDER ULTRASOUND RETROGRADE NEPHRO-URETERAL CATHETER   PLACEMENT UNDER FLUOROSCOPY  Technique: The procedure, risks (including but not limited to bleeding, infection, organ damage), benefits, and alternatives were explained to the patient.  Questions regarding the procedure were encouraged and answered.  The patient understands and consents to the procedure.The ileostomy and rightflank region prepped with Betadine, draped in usual sterile fashion, infiltrated locally with 1% lidocaine.  As antibiotic prophylaxis, Cipro 400 mg was ordered pre-procedure and administered intravenously within one hour of incision.  Intravenous Fentanyl and Versed were administered as conscious sedation during continuous cardiorespiratory monitoring by the radiology RN, with a total moderate sedation time of 25 minutes.  Initially, a retrograde approach was selected.  I passed a short 5- [place] Kumpe catheter through the ostomy into the ileal conduit. With the aid of an angled glide wire, and small contrast injection, using various fluoro angulations, I attempted to find and negotiate the right ureteral anastomosis.  A patent distal left ureteral anastomosis was noted.  However, the right distal ureter cannot be cannulated.  A small false passage was made.  Attention was then  turned to the antegrade approach. Under real-time ultrasound guidance, a 21-gauge trocar needle was advanced into a posterior right lower pole calyx. Ultrasound image documentation was saved. Urine spontaneously returned through the needle. Needle was exchanged over a guidewire for transitional dilator. Contrast injection confirmed appropriate positioning. Catheter was exchanged over a guidewire for a a 5-French Kumpe catheter, advanced with the aid of an angled glide wire across the distal ureteral stenosis and through the ileal conduit, the guide wire was exchanged for an Amplatz wire.  A 10-French long pigtail catheter was then advanced retrograde, formed in the central aspect of the right renal collecting system and left in place as retrograde nephro-ureteral drainage catheter.  Percutaneous access to the kidney was removed. Contrast injection confirms appropriate positioning and patency. The ileal conduit drainage bag was read placed. The patient tolerated the procedure well.  No immediate complication.  IMPRESSION 1.  Distal right ureteral stenosis could not be crossed retrograde. 2.  Technically successful right percutaneous nephrostomy access to allow guide wire passage across the right ureteral stenosis and ileal conduit.  3.  Technically successful retrograde right nephro-ureteral catheter replacement.   Original Report Authenticated By: D. Andria Rhein, MD    Ir US Guide Bx Asp/drain  01/22/2013  *RADIOLOGY REPORT*  Clinical Data:   ileal conduit with inadvertent removal of retrograde nephro-ureteral catheter, new right hydronephrosis.  PERC NEPHROSTOMY*R* UNDER ULTRASOUND RETROGRADE NEPHRO-URETERAL CATHETER   PLACEMENT UNDER FLUOROSCOPY  Technique: The procedure, risks (including but not limited to bleeding, infection, organ damage), benefits, and alternatives were explained to the patient.  Questions regarding the procedure were encouraged and answered.  The patient understands and consents to the  procedure.The ileostomy and rightflank region prepped with Betadine, draped in usual sterile fashion, infiltrated locally with 1% lidocaine.  As antibiotic prophylaxis, Cipro 400 mg was ordered pre-procedure and administered intravenously within one hour  of incision.  Intravenous Fentanyl and Versed were administered as conscious sedation during continuous cardiorespiratory monitoring by the radiology RN, with a total moderate sedation time of 25 minutes.  Initially, a retrograde approach was selected.  I passed a short 5- [place] Kumpe catheter through the ostomy into the ileal conduit. With the aid of an angled glide wire, and small contrast injection, using various fluoro angulations, I attempted to find and negotiate the right ureteral anastomosis.  A patent distal left ureteral anastomosis was noted.  However, the right distal ureter cannot be cannulated.  A small false passage was made.  Attention was then turned to the antegrade approach. Under real-time ultrasound guidance, a 21-gauge trocar needle was advanced into a posterior right lower pole calyx. Ultrasound image documentation was saved. Urine spontaneously returned through the needle. Needle was exchanged over a guidewire for transitional dilator. Contrast injection confirmed appropriate positioning. Catheter was exchanged over a guidewire for a a 5-French Kumpe catheter, advanced with the aid of an angled glide wire across the distal ureteral stenosis and through the ileal conduit, the guide wire was exchanged for an Amplatz wire.  A 10-French long pigtail catheter was then advanced retrograde, formed in the central aspect of the right renal collecting system and left in place as retrograde nephro-ureteral drainage catheter.  Percutaneous access to the kidney was removed. Contrast injection confirms appropriate positioning and patency. The ileal conduit drainage bag was read placed. The patient tolerated the procedure well.  No immediate complication.   IMPRESSION 1.  Distal right ureteral stenosis could not be crossed retrograde. 2.  Technically successful right percutaneous nephrostomy access to allow guide wire passage across the right ureteral stenosis and ileal conduit.  3.  Technically successful retrograde right nephro-ureteral catheter replacement.   Original Report Authenticated By: D. Andria Rhein, MD    Ir [place] Abts Cath Perc Right  01/22/2013  *RADIOLOGY REPORT*  Clinical Data:   ileal conduit with inadvertent removal of retrograde nephro-ureteral catheter, new right hydronephrosis.  PERC NEPHROSTOMY*R* UNDER ULTRASOUND RETROGRADE NEPHRO-URETERAL CATHETER   PLACEMENT UNDER FLUOROSCOPY  Technique: The procedure, risks (including but not limited to bleeding, infection, organ damage), benefits, and alternatives were explained to the patient.  Questions regarding the procedure were encouraged and answered.  The patient understands and consents to the procedure.The ileostomy and rightflank region prepped with Betadine, draped in usual sterile fashion, infiltrated locally with 1% lidocaine.  As antibiotic prophylaxis, Cipro 400 mg was ordered pre-procedure and administered intravenously within one hour of incision.  Intravenous Fentanyl and Versed were administered as conscious sedation during continuous cardiorespiratory monitoring by the radiology RN, with a total moderate sedation time of 25 minutes.  Initially, a retrograde approach was selected.  I passed a short 5- [place] Kumpe catheter through the ostomy into the ileal conduit. With the aid of an angled glide wire, and small contrast injection, using various fluoro angulations, I attempted to find and negotiate the right ureteral anastomosis.  A patent distal left ureteral anastomosis was noted.  However, the right distal ureter cannot be cannulated.  A small false passage was made.  Attention was then turned to the antegrade approach. Under real-time ultrasound guidance, a 21-gauge trocar needle  was advanced into a posterior right lower pole calyx. Ultrasound image documentation was saved. Urine spontaneously returned through the needle. Needle was exchanged over a guidewire for transitional dilator. Contrast injection confirmed appropriate positioning. Catheter was exchanged over a guidewire for a a 5-French Kumpe catheter, advanced with the aid  of an angled glide wire across the distal ureteral stenosis and through the ileal conduit, the guide wire was exchanged for an Amplatz wire.  A 10-French long pigtail catheter was then advanced retrograde, formed in the central aspect of the right renal collecting system and left in place as retrograde nephro-ureteral drainage catheter.  Percutaneous access to the kidney was removed. Contrast injection confirms appropriate positioning and patency. The ileal conduit drainage bag was read placed. The patient tolerated the procedure well.  No immediate complication.  IMPRESSION 1.  Distal right ureteral stenosis could not be crossed retrograde. 2.  Technically successful right percutaneous nephrostomy access to allow guide wire passage across the right ureteral stenosis and ileal conduit.  3.  Technically successful retrograde right nephro-ureteral catheter replacement.   Original Report Authenticated By: D. Andria Rhein, MD     Treatments: Percutaneous insertion of right nephroureteral catheter  Discharge Exam: Blood pressure 107/62, pulse 63, temperature 99.4 F (37.4 C), temperature source Oral, resp. rate 18, height 6\' 2"  (1.88 m), weight 202 lb 9.6 oz (91.899 kg), SpO2 92.00%. Abdomen: Soft, non tender.   Ileal loop draining clear urine.  Colostomy functioning well  Disposition: 06-Home-Health Care Svc   Future Appointments Provider Department Dept Phone   02/27/2013 11:30 AM [facility]-INTERVENTIONAL RADIOLOGY [phone]   04/24/2013 9:00 AM [facility] [phone]   Eat a  light meal the night before the exam but please avoid gaseous foods.   Nothing to eat or drink for at least 8 hours prior to the exam. No gum chewing or smoking the morning of the exam. Please take your morning medications with small sips of water, especially blood pressure medication. If you have several vascular lab exams and will see physician, please bring a snack with you.   04/24/2013 9:40 AM Evern Bio, NP [facility] [phone]       Medication List    TAKE these medications       acetaminophen 500 MG tablet  Commonly known as:  TYLENOL  Take 1,000 mg by mouth every 6 (six) hours as needed. For pain     albuterol 108 (90 BASE) MCG/ACT inhaler  Commonly known as:  PROVENTIL HFA;VENTOLIN HFA  Inhale 2 puffs into the lungs every 4 (four) hours as needed. For shortness of breath     aspirin EC 81 MG tablet  Take 81 mg by mouth at bedtime.     buPROPion 150 MG 12 hr tablet  Commonly known as:  WELLBUTRIN SR  Take 150 mg by mouth 2 (two) times daily.     CALCIUM MAGNESIUM PO  Take 1 tablet by mouth every morning.     ciprofloxacin 500 MG tablet  Commonly known as:  CIPRO  Take 500 mg by mouth 2 (two) times daily as needed. Taken if patient has kidney infection. Alternating with Hiprex.     gabapentin 300 MG capsule  Commonly known as:  NEURONTIN  Take 300 mg by mouth 3 (three) times daily.     glucosamine-chondroitin 500-400 MG tablet  Take 1 tablet by mouth 2 (two) times daily.     hydrophilic ointment  Apply 1 application topically daily as needed. Applies to feet.     ketoconazole 2 % shampoo  Commonly known as:  NIZORAL  Apply 1 application topically 2 (two) times a week.     methenamine 1 G tablet  Commonly known as:  HIPREX  Take 1 g by mouth daily as needed. Taken if patient has kidney infection. Alternating with Cipro.     multivitamin with minerals Tabs  Take 1 tablet by mouth every morning.     nitrofurantoin 50 MG  capsule  Commonly known as:  MACRODANTIN  Take 50 mg by mouth at bedtime.     selenium sulfide 2.5 % shampoo  Commonly known as:  SELSUN  Apply 1 application topically daily as needed (Applies to scalp for dandruff or itching.).     simvastatin 20 MG tablet  Commonly known as:  ZOCOR  Take 20 mg by mouth at bedtime.     sodium chloride 0.65 % nasal spray  Commonly known as:  OCEAN  Place 1 spray into the nose daily as needed. For nasal decongestant     sorbitol 70 % solution  Take 15-30 mLs by mouth daily as needed. For constipation     tiotropium 18 MCG inhalation capsule  Commonly known as:  SPIRIVA  Place 18 mcg into inhaler and inhale at bedtime.     vitamin C 500 MG tablet  Commonly known as:  ASCORBIC ACID  Take 500 mg by mouth daily.         Signed: Bellina Tokarczyk-HENRY 01/23/2013, 10:17 AM

## 2013-01-23 NOTE — Progress Notes (Signed)
Patient spiked a fever of 102.3 orally. MD Annabell Howells was notified. He ordered urine cultures, blood cultures, and started 2 different antibiotics. He ordered a 1x dose of both antibiotics and was going to let MD Nesi further address the issue during his rounds. Patient's temperature came down to 99.3 after receiving tylenol PO.

## 2013-01-23 NOTE — Progress Notes (Addendum)
Pharmacy Consult Note: Renal Adjust Antibiotics  Patient known to Pharmacy from initial Vancomycin consult 2/24 pm.  79 yo male admitted 2/23 with flank pain, s/p right percutaneous nephrostomy 2/24.  -Received Cipro IV x 1 dose 2/23, then x 2 doses post-op 2/24. MD ordered to continue Cipro 400mg  IV q12h for GNR in urine culture.   -Patient also started on Vancomycin 2/24 pm for temperature spike 102.3. MD dc'ed Vanc this morning when Cipro reordered. -This afternoon, MD re-ordered Vancomycin per Pharmacy Protocol.  Cultures: 2/23 urine: 100K GNR 2/24 blood: 2/25 urine:   Labs/Vitals: Tmax: 102.3 overnight, 99.4 this am WBCs: 6.5k > 3.9k Renal: SCr improved 1.85 > 1.55, CrCl 44 ml/min Wt: 92kg  Goal of Therapy: Vancomycin troughs 15-20 mcg/ml Cipro dose per renal function  Plan:  Resume Vancomycin 1250mg  IV q24h - next dose due tonight 22:00  Check trough at steady state if continues, however, do not see that continued therapy will be needed since only GNR identified  Continue Cipro 400mg  IV q12h as ordered  F/U final culture results  Loralee Pacas, PharmD, BCPS Pager: [phone] 01/23/2013 1:33 PM   Addendum: Dr. Brunilda Payor notified Pharmacy that Vancomycin can be dc'd  Loralee Pacas, PharmD, BCPS 01/23/2013 2:02 PM

## 2013-01-24 MED ORDER — CIPROFLOXACIN HCL 250 MG PO TABS: 250.0000 mg | ORAL_TABLET | Freq: Two times a day (BID) | ORAL | Status: DC

## 2013-01-24 NOTE — Discharge Summary (Signed)
Patient ID: Michael Novak MRN: [id number] DOB/AGE: 1930-03-09 79 y.o.  Admit date: 01/21/2013 Discharge date: 01/24/2013  Admission Diagnoses: Hydronephrosis [591] UTI (lower urinary tract infection) [599.0]  Discharge Diagnoses:    Discharged Condition: Improved  Hospital Course: Mr Keena was admitted on 2/23 with right flank pain, nausea and vomiting after right nephroureteral stent came out.  His creatinine went up to 1.81.  Renal ultrasound showed marked right hydronephrosis.  The stent was replaced on 2023/01/23.  His creatinine went down to 1.5 on 01-23-23.  Urine culture on 2/23 was positive for Klebsiella, sensitive to Cipro.  He was scheduled for discharge yesterday, but he spiked a temperature to 102. 3 the night before.  Discharge was canceled and he was started on Cipro.  He has remained afebrile for 24 hours.  He does not have any flank pain.  He tolerates his diet well.  Will discharge home today on oral cipro.  He will be followed by his urologist,  Dr Marcelyn Bruins at [facility].  Consults:Interventional Radiology  Significant Diagnostic Studies: Ir Nephrostogram Right  01-23-13  *RADIOLOGY REPORT*  Clinical Data:   ileal conduit with inadvertent removal of retrograde nephro-ureteral catheter, new right hydronephrosis.  PERC NEPHROSTOMY*R* UNDER ULTRASOUND RETROGRADE NEPHRO-URETERAL CATHETER   PLACEMENT UNDER FLUOROSCOPY  Technique: The procedure, risks (including but not limited to bleeding, infection, organ damage), benefits, and alternatives were explained to the patient.  Questions regarding the procedure were encouraged and answered.  The patient understands and consents to the procedure.The ileostomy and rightflank region prepped with Betadine, draped in usual sterile fashion, infiltrated locally with 1% lidocaine.  As antibiotic prophylaxis, Cipro 400 mg was ordered pre-procedure and administered intravenously within one hour of incision.  Intravenous Fentanyl and Versed were  administered as conscious sedation during continuous cardiorespiratory monitoring by the radiology RN, with a total moderate sedation time of 25 minutes.  Initially, a retrograde approach was selected.  I passed a short 5- [place] Kumpe catheter through the ostomy into the ileal conduit. With the aid of an angled glide wire, and small contrast injection, using various fluoro angulations, I attempted to find and negotiate the right ureteral anastomosis.  A patent distal left ureteral anastomosis was noted.  However, the right distal ureter cannot be cannulated.  A small false passage was made.  Attention was then turned to the antegrade approach. Under real-time ultrasound guidance, a 21-gauge trocar needle was advanced into a posterior right lower pole calyx. Ultrasound image documentation was saved. Urine spontaneously returned through the needle. Needle was exchanged over a guidewire for transitional dilator. Contrast injection confirmed appropriate positioning. Catheter was exchanged over a guidewire for a a 5-French Kumpe catheter, advanced with the aid of an angled glide wire across the distal ureteral stenosis and through the ileal conduit, the guide wire was exchanged for an Amplatz wire.  A 10-French long pigtail catheter was then advanced retrograde, formed in the central aspect of the right renal collecting system and left in place as retrograde nephro-ureteral drainage catheter.  Percutaneous access to the kidney was removed. Contrast injection confirms appropriate positioning and patency. The ileal conduit drainage bag was read placed. The patient tolerated the procedure well.  No immediate complication.  IMPRESSION 1.  Distal right ureteral stenosis could not be crossed retrograde. 2.  Technically successful right percutaneous nephrostomy access to allow guide wire passage across the right ureteral stenosis and ileal conduit.  3.  Technically successful retrograde right nephro-ureteral catheter  replacement.   Original Report  Authenticated By: D. Andria Rhein, MD    Ir Perc Nephrostomy Right  01/22/2013  *RADIOLOGY REPORT*  Clinical Data:   ileal conduit with inadvertent removal of retrograde nephro-ureteral catheter, new right hydronephrosis.  PERC NEPHROSTOMY*R* UNDER ULTRASOUND RETROGRADE NEPHRO-URETERAL CATHETER   PLACEMENT UNDER FLUOROSCOPY  Technique: The procedure, risks (including but not limited to bleeding, infection, organ damage), benefits, and alternatives were explained to the patient.  Questions regarding the procedure were encouraged and answered.  The patient understands and consents to the procedure.The ileostomy and rightflank region prepped with Betadine, draped in usual sterile fashion, infiltrated locally with 1% lidocaine.  As antibiotic prophylaxis, Cipro 400 mg was ordered pre-procedure and administered intravenously within one hour of incision.  Intravenous Fentanyl and Versed were administered as conscious sedation during continuous cardiorespiratory monitoring by the radiology RN, with a total moderate sedation time of 25 minutes.  Initially, a retrograde approach was selected.  I passed a short 5- [place] Kumpe catheter through the ostomy into the ileal conduit. With the aid of an angled glide wire, and small contrast injection, using various fluoro angulations, I attempted to find and negotiate the right ureteral anastomosis.  A patent distal left ureteral anastomosis was noted.  However, the right distal ureter cannot be cannulated.  A small false passage was made.  Attention was then turned to the antegrade approach. Under real-time ultrasound guidance, a 21-gauge trocar needle was advanced into a posterior right lower pole calyx. Ultrasound image documentation was saved. Urine spontaneously returned through the needle. Needle was exchanged over a guidewire for transitional dilator. Contrast injection confirmed appropriate positioning. Catheter was exchanged over a guidewire  for a a 5-French Kumpe catheter, advanced with the aid of an angled glide wire across the distal ureteral stenosis and through the ileal conduit, the guide wire was exchanged for an Amplatz wire.  A 10-French long pigtail catheter was then advanced retrograde, formed in the central aspect of the right renal collecting system and left in place as retrograde nephro-ureteral drainage catheter.  Percutaneous access to the kidney was removed. Contrast injection confirms appropriate positioning and patency. The ileal conduit drainage bag was read placed. The patient tolerated the procedure well.  No immediate complication.  IMPRESSION 1.  Distal right ureteral stenosis could not be crossed retrograde. 2.  Technically successful right percutaneous nephrostomy access to allow guide wire passage across the right ureteral stenosis and ileal conduit.  3.  Technically successful retrograde right nephro-ureteral catheter replacement.   Original Report Authenticated By: D. Andria Rhein, MD    Ir US Guide Bx Asp/drain  01/22/2013  *RADIOLOGY REPORT*  Clinical Data:   ileal conduit with inadvertent removal of retrograde nephro-ureteral catheter, new right hydronephrosis.  PERC NEPHROSTOMY*R* UNDER ULTRASOUND RETROGRADE NEPHRO-URETERAL CATHETER   PLACEMENT UNDER FLUOROSCOPY  Technique: The procedure, risks (including but not limited to bleeding, infection, organ damage), benefits, and alternatives were explained to the patient.  Questions regarding the procedure were encouraged and answered.  The patient understands and consents to the procedure.The ileostomy and rightflank region prepped with Betadine, draped in usual sterile fashion, infiltrated locally with 1% lidocaine.  As antibiotic prophylaxis, Cipro 400 mg was ordered pre-procedure and administered intravenously within one hour of incision.  Intravenous Fentanyl and Versed were administered as conscious sedation during continuous cardiorespiratory monitoring by the  radiology RN, with a total moderate sedation time of 25 minutes.  Initially, a retrograde approach was selected.  I passed a short 5- [place] Kumpe catheter through the ostomy into the  ileal conduit. With the aid of an angled glide wire, and small contrast injection, using various fluoro angulations, I attempted to find and negotiate the right ureteral anastomosis.  A patent distal left ureteral anastomosis was noted.  However, the right distal ureter cannot be cannulated.  A small false passage was made.  Attention was then turned to the antegrade approach. Under real-time ultrasound guidance, a 21-gauge trocar needle was advanced into a posterior right lower pole calyx. Ultrasound image documentation was saved. Urine spontaneously returned through the needle. Needle was exchanged over a guidewire for transitional dilator. Contrast injection confirmed appropriate positioning. Catheter was exchanged over a guidewire for a a 5-French Kumpe catheter, advanced with the aid of an angled glide wire across the distal ureteral stenosis and through the ileal conduit, the guide wire was exchanged for an Amplatz wire.  A 10-French long pigtail catheter was then advanced retrograde, formed in the central aspect of the right renal collecting system and left in place as retrograde nephro-ureteral drainage catheter.  Percutaneous access to the kidney was removed. Contrast injection confirms appropriate positioning and patency. The ileal conduit drainage bag was read placed. The patient tolerated the procedure well.  No immediate complication.  IMPRESSION 1.  Distal right ureteral stenosis could not be crossed retrograde. 2.  Technically successful right percutaneous nephrostomy access to allow guide wire passage across the right ureteral stenosis and ileal conduit.  3.  Technically successful retrograde right nephro-ureteral catheter replacement.   Original Report Authenticated By: D. Andria Rhein, MD    Ir [place] Abts Cath Perc  Right  01/22/2013  *RADIOLOGY REPORT*  Clinical Data:   ileal conduit with inadvertent removal of retrograde nephro-ureteral catheter, new right hydronephrosis.  PERC NEPHROSTOMY*R* UNDER ULTRASOUND RETROGRADE NEPHRO-URETERAL CATHETER   PLACEMENT UNDER FLUOROSCOPY  Technique: The procedure, risks (including but not limited to bleeding, infection, organ damage), benefits, and alternatives were explained to the patient.  Questions regarding the procedure were encouraged and answered.  The patient understands and consents to the procedure.The ileostomy and rightflank region prepped with Betadine, draped in usual sterile fashion, infiltrated locally with 1% lidocaine.  As antibiotic prophylaxis, Cipro 400 mg was ordered pre-procedure and administered intravenously within one hour of incision.  Intravenous Fentanyl and Versed were administered as conscious sedation during continuous cardiorespiratory monitoring by the radiology RN, with a total moderate sedation time of 25 minutes.  Initially, a retrograde approach was selected.  I passed a short 5- [place] Kumpe catheter through the ostomy into the ileal conduit. With the aid of an angled glide wire, and small contrast injection, using various fluoro angulations, I attempted to find and negotiate the right ureteral anastomosis.  A patent distal left ureteral anastomosis was noted.  However, the right distal ureter cannot be cannulated.  A small false passage was made.  Attention was then turned to the antegrade approach. Under real-time ultrasound guidance, a 21-gauge trocar needle was advanced into a posterior right lower pole calyx. Ultrasound image documentation was saved. Urine spontaneously returned through the needle. Needle was exchanged over a guidewire for transitional dilator. Contrast injection confirmed appropriate positioning. Catheter was exchanged over a guidewire for a a 5-French Kumpe catheter, advanced with the aid of an angled glide wire across the  distal ureteral stenosis and through the ileal conduit, the guide wire was exchanged for an Amplatz wire.  A 10-French long pigtail catheter was then advanced retrograde, formed in the central aspect of the right renal collecting system and left in place as  retrograde nephro-ureteral drainage catheter.  Percutaneous access to the kidney was removed. Contrast injection confirms appropriate positioning and patency. The ileal conduit drainage bag was read placed. The patient tolerated the procedure well.  No immediate complication.  IMPRESSION 1.  Distal right ureteral stenosis could not be crossed retrograde. 2.  Technically successful right percutaneous nephrostomy access to allow guide wire passage across the right ureteral stenosis and ileal conduit.  3.  Technically successful retrograde right nephro-ureteral catheter replacement.   Original Report Authenticated By: D. Andria Rhein, MD     Treatments: Insertion of right nephro ureteral catheter  Discharge Exam: Blood pressure 138/74, pulse 68, temperature 99.7 F (37.6 C), temperature source Oral, resp. rate 20, height 6\' 2"  (1.88 m), weight 202 lb 9.6 oz (91.899 kg), SpO2 92.00%. Abdomen: Soft, non tender.  Ileal loop and colostomy functioning well Urine clear. Urine culture: 2/23 positive for Klebsiella pneumoniae sensitive to Cipro. Culture 2/25: No growth  Disposition: 06-Home-Health Care Svc   Future Appointments Provider Department Dept Phone   02/27/2013 11:30 AM [facility]-INTERVENTIONAL RADIOLOGY [phone]   04/24/2013 9:00 AM [facility] [phone]   Eat a light meal the night before the exam but please avoid gaseous foods.   Nothing to eat or drink for at least 8 hours prior to the exam. No gum chewing or smoking the morning of the exam. Please take your morning medications with small sips of water, especially blood pressure medication. If you have several  vascular lab exams and will see physician, please bring a snack with you.   04/24/2013 9:40 AM Evern Bio, NP [facility] [phone]       Medication List    TAKE these medications       acetaminophen 500 MG tablet  Commonly known as:  TYLENOL  Take 1,000 mg by mouth every 6 (six) hours as needed. For pain     albuterol 108 (90 BASE) MCG/ACT inhaler  Commonly known as:  PROVENTIL HFA;VENTOLIN HFA  Inhale 2 puffs into the lungs every 4 (four) hours as needed. For shortness of breath     aspirin EC 81 MG tablet  Take 81 mg by mouth at bedtime.     buPROPion 150 MG 12 hr tablet  Commonly known as:  WELLBUTRIN SR  Take 150 mg by mouth 2 (two) times daily.     CALCIUM MAGNESIUM PO  Take 1 tablet by mouth every morning.     ciprofloxacin 250 MG tablet  Commonly known as:  CIPRO  Take 1 tablet (250 mg total) by mouth 2 (two) times daily.     ciprofloxacin 500 MG tablet  Commonly known as:  CIPRO  Take 500 mg by mouth 2 (two) times daily as needed. Taken if patient has kidney infection. Alternating with Hiprex.     gabapentin 300 MG capsule  Commonly known as:  NEURONTIN  Take 300 mg by mouth 3 (three) times daily.     glucosamine-chondroitin 500-400 MG tablet  Take 1 tablet by mouth 2 (two) times daily.     hydrophilic ointment  Apply 1 application topically daily as needed. Applies to feet.     ketoconazole 2 % shampoo  Commonly known as:  NIZORAL  Apply 1 application topically 2 (two) times a week.     methenamine 1 G tablet  Commonly known as:  HIPREX  Take 1 g by mouth daily as needed. Taken if patient  has kidney infection. Alternating with Cipro.     multivitamin with minerals Tabs  Take 1 tablet by mouth every morning.     nitrofurantoin 50 MG capsule  Commonly known as:  MACRODANTIN  Take 50 mg by mouth at bedtime.     selenium sulfide 2.5 % shampoo  Commonly known as:  SELSUN  Apply 1 application topically daily as  needed (Applies to scalp for dandruff or itching.).     simvastatin 20 MG tablet  Commonly known as:  ZOCOR  Take 20 mg by mouth at bedtime.     sodium chloride 0.65 % nasal spray  Commonly known as:  OCEAN  Place 1 spray into the nose daily as needed. For nasal decongestant     sorbitol 70 % solution  Take 15-30 mLs by mouth daily as needed. For constipation     tiotropium 18 MCG inhalation capsule  Commonly known as:  SPIRIVA  Place 18 mcg into inhaler and inhale at bedtime.     vitamin C 500 MG tablet  Commonly known as:  ASCORBIC ACID  Take 500 mg by mouth daily.         Signed: Loan Oguin-HENRY 01/24/2013, 8:37 AM

## 2013-01-29 LAB — CULTURE, BLOOD (ROUTINE X 2): Culture: NO GROWTH

## 2013-02-16 ENCOUNTER — Encounter (HOSPITAL_COMMUNITY): Payer: Self-pay | Admitting: Pharmacy Technician

## 2013-02-27 ENCOUNTER — Ambulatory Visit (HOSPITAL_COMMUNITY): Admission: RE | Admit: 2013-02-27 | Payer: Medicare Other | Source: Ambulatory Visit

## 2013-02-28 ENCOUNTER — Ambulatory Visit (HOSPITAL_COMMUNITY)
Admission: RE | Admit: 2013-02-28 | Discharge: 2013-02-28 | Disposition: A | Discharge status: Home / Self Care | Payer: Medicare Other | Source: Ambulatory Visit | Attending: Urology | Admitting: Urology

## 2013-02-28 ENCOUNTER — Other Ambulatory Visit (HOSPITAL_COMMUNITY): Payer: Self-pay | Admitting: Urology

## 2013-02-28 DIAGNOSIS — N1330 Unspecified hydronephrosis: Secondary | ICD-10-CM

## 2013-02-28 DIAGNOSIS — Z932 Ileostomy status: Secondary | ICD-10-CM | POA: Insufficient documentation

## 2013-02-28 DIAGNOSIS — N135 Crossing vessel and stricture of ureter without hydronephrosis: Secondary | ICD-10-CM | POA: Insufficient documentation

## 2013-02-28 MED ORDER — IOHEXOL 300 MG/ML  SOLN
10.0000 mL | Freq: Once | INTRAMUSCULAR | Status: AC | PRN
  Administered 2013-02-28: 15 mL

## 2013-02-28 NOTE — Procedures (Signed)
Exchange retrograde nephroureteral No complication No blood loss. See complete dictation in [facility].

## 2013-03-21 ENCOUNTER — Other Ambulatory Visit (HOSPITAL_COMMUNITY): Payer: Self-pay | Admitting: Urology

## 2013-03-21 DIAGNOSIS — N135 Crossing vessel and stricture of ureter without hydronephrosis: Secondary | ICD-10-CM

## 2013-03-25 ENCOUNTER — Inpatient Hospital Stay (HOSPITAL_COMMUNITY)
Admission: EM | Admit: 2013-03-25 | Discharge: 2013-04-09 | DRG: 389 | Disposition: A | Discharge status: Home / Self Care | Payer: Medicare Other | Attending: Internal Medicine | Admitting: Internal Medicine

## 2013-03-25 ENCOUNTER — Encounter (HOSPITAL_COMMUNITY): Payer: Self-pay | Admitting: *Deleted

## 2013-03-25 ENCOUNTER — Emergency Department (HOSPITAL_COMMUNITY): Payer: Medicare Other

## 2013-03-25 DIAGNOSIS — J4489 Other specified chronic obstructive pulmonary disease: Secondary | ICD-10-CM | POA: Diagnosis present

## 2013-03-25 DIAGNOSIS — Z66 Do not resuscitate: Secondary | ICD-10-CM | POA: Diagnosis present

## 2013-03-25 DIAGNOSIS — K469 Unspecified abdominal hernia without obstruction or gangrene: Secondary | ICD-10-CM | POA: Diagnosis present

## 2013-03-25 DIAGNOSIS — Z87891 Personal history of nicotine dependence: Secondary | ICD-10-CM

## 2013-03-25 DIAGNOSIS — N135 Crossing vessel and stricture of ureter without hydronephrosis: Secondary | ICD-10-CM

## 2013-03-25 DIAGNOSIS — K56609 Unspecified intestinal obstruction, unspecified as to partial versus complete obstruction: Secondary | ICD-10-CM | POA: Insufficient documentation

## 2013-03-25 DIAGNOSIS — C61 Malignant neoplasm of prostate: Secondary | ICD-10-CM | POA: Diagnosis present

## 2013-03-25 DIAGNOSIS — I440 Atrioventricular block, first degree: Secondary | ICD-10-CM | POA: Diagnosis present

## 2013-03-25 DIAGNOSIS — R001 Bradycardia, unspecified: Secondary | ICD-10-CM

## 2013-03-25 DIAGNOSIS — Z933 Colostomy status: Secondary | ICD-10-CM

## 2013-03-25 DIAGNOSIS — N1330 Unspecified hydronephrosis: Secondary | ICD-10-CM

## 2013-03-25 DIAGNOSIS — E876 Hypokalemia: Secondary | ICD-10-CM

## 2013-03-25 DIAGNOSIS — N179 Acute kidney failure, unspecified: Secondary | ICD-10-CM

## 2013-03-25 DIAGNOSIS — IMO0002 Reserved for concepts with insufficient information to code with codable children: Secondary | ICD-10-CM

## 2013-03-25 DIAGNOSIS — N360 Urethral fistula: Secondary | ICD-10-CM | POA: Diagnosis present

## 2013-03-25 DIAGNOSIS — Z79899 Other long term (current) drug therapy: Secondary | ICD-10-CM

## 2013-03-25 DIAGNOSIS — J9819 Other pulmonary collapse: Secondary | ICD-10-CM | POA: Diagnosis not present

## 2013-03-25 DIAGNOSIS — F329 Major depressive disorder, single episode, unspecified: Secondary | ICD-10-CM | POA: Diagnosis present

## 2013-03-25 DIAGNOSIS — K8020 Calculus of gallbladder without cholecystitis without obstruction: Secondary | ICD-10-CM | POA: Diagnosis present

## 2013-03-25 DIAGNOSIS — E8779 Other fluid overload: Secondary | ICD-10-CM | POA: Diagnosis not present

## 2013-03-25 DIAGNOSIS — Z936 Other artificial openings of urinary tract status: Secondary | ICD-10-CM

## 2013-03-25 DIAGNOSIS — E785 Hyperlipidemia, unspecified: Secondary | ICD-10-CM | POA: Diagnosis present

## 2013-03-25 DIAGNOSIS — F3289 Other specified depressive episodes: Secondary | ICD-10-CM | POA: Diagnosis present

## 2013-03-25 DIAGNOSIS — I498 Other specified cardiac arrhythmias: Secondary | ICD-10-CM | POA: Diagnosis present

## 2013-03-25 DIAGNOSIS — G5790 Unspecified mononeuropathy of unspecified lower limb: Secondary | ICD-10-CM | POA: Diagnosis present

## 2013-03-25 DIAGNOSIS — E46 Unspecified protein-calorie malnutrition: Secondary | ICD-10-CM | POA: Diagnosis present

## 2013-03-25 DIAGNOSIS — I714 Abdominal aortic aneurysm, without rupture, unspecified: Secondary | ICD-10-CM

## 2013-03-25 DIAGNOSIS — K458 Other specified abdominal hernia without obstruction or gangrene: Secondary | ICD-10-CM | POA: Diagnosis present

## 2013-03-25 DIAGNOSIS — J449 Chronic obstructive pulmonary disease, unspecified: Secondary | ICD-10-CM

## 2013-03-25 DIAGNOSIS — E860 Dehydration: Secondary | ICD-10-CM

## 2013-03-25 DIAGNOSIS — R509 Fever, unspecified: Secondary | ICD-10-CM

## 2013-03-25 DIAGNOSIS — M109 Gout, unspecified: Secondary | ICD-10-CM

## 2013-03-25 DIAGNOSIS — Z6825 Body mass index (BMI) 25.0-25.9, adult: Secondary | ICD-10-CM

## 2013-03-25 DIAGNOSIS — M13149 Monoarthritis, not elsewhere classified, unspecified hand: Secondary | ICD-10-CM | POA: Diagnosis present

## 2013-03-25 DIAGNOSIS — K9409 Other complications of colostomy: Secondary | ICD-10-CM | POA: Diagnosis present

## 2013-03-25 HISTORY — DX: Bradycardia, unspecified: R00.1

## 2013-03-25 HISTORY — DX: Hyperlipidemia, unspecified: E78.5

## 2013-03-25 HISTORY — DX: Other complications of colostomy: K94.09

## 2013-03-25 HISTORY — DX: Urethral fistula: N36.0

## 2013-03-25 LAB — CBC WITH DIFFERENTIAL/PLATELET
Basophils Relative: 0 % (ref 0–1)
Eosinophils Absolute: 0 10*3/uL (ref 0.0–0.7)
HCT: 42.8 % (ref 39.0–52.0)
Hemoglobin: 15 g/dL (ref 13.0–17.0)
MCH: 31.2 pg (ref 26.0–34.0)
MCHC: 35 g/dL (ref 30.0–36.0)
Monocytes Absolute: 0.6 10*3/uL (ref 0.1–1.0)
Monocytes Relative: 12 % (ref 3–12)

## 2013-03-25 LAB — URINALYSIS, ROUTINE W REFLEX MICROSCOPIC
Bilirubin Urine: NEGATIVE
Ketones, ur: NEGATIVE mg/dL
Nitrite: NEGATIVE
pH: 7.5 (ref 5.0–8.0)

## 2013-03-25 LAB — BASIC METABOLIC PANEL
BUN: 24 mg/dL — ABNORMAL HIGH (ref 6–23)
Chloride: 100 mEq/L (ref 96–112)
Creatinine, Ser: 1.33 mg/dL (ref 0.50–1.35)
GFR calc Af Amer: 57 mL/min — ABNORMAL LOW (ref >90)
GFR calc non Af Amer: 49 mL/min — ABNORMAL LOW (ref >90)
Glucose, Bld: 118 mg/dL — ABNORMAL HIGH (ref 70–99)

## 2013-03-25 LAB — URINE MICROSCOPIC-ADD ON

## 2013-03-25 MED ORDER — HEPARIN SODIUM (PORCINE) 5000 UNIT/ML IJ SOLN
5000.0000 [IU] | Freq: Three times a day (TID) | INTRAMUSCULAR | Status: DC
  Administered 2013-03-25 – 2013-04-09 (×44): 5000 [IU] via SUBCUTANEOUS
  Filled 2013-03-25 (×47): qty 1

## 2013-03-25 MED ORDER — SODIUM CHLORIDE 0.9 % IV SOLN
INTRAVENOUS | Status: DC
  Administered 2013-03-25: 15:00:00 via INTRAVENOUS

## 2013-03-25 MED ORDER — ONDANSETRON HCL 4 MG/2ML IJ SOLN
4.0000 mg | Freq: Four times a day (QID) | INTRAMUSCULAR | Status: DC | PRN
  Administered 2013-03-25 (×2): 4 mg via INTRAVENOUS
  Filled 2013-03-25: qty 2

## 2013-03-25 MED ORDER — HYDROMORPHONE HCL PF 1 MG/ML IJ SOLN: 0.5000 mg | INTRAMUSCULAR | Status: AC | PRN

## 2013-03-25 MED ORDER — HYDROMORPHONE HCL PF 1 MG/ML IJ SOLN
INTRAMUSCULAR | Status: AC
  Administered 2013-03-25: 1 mg
  Filled 2013-03-25: qty 1

## 2013-03-25 MED ORDER — MORPHINE SULFATE 4 MG/ML IJ SOLN
4.0000 mg | Freq: Once | INTRAMUSCULAR | Status: AC
  Administered 2013-03-25: 4 mg via INTRAVENOUS
  Filled 2013-03-25: qty 1

## 2013-03-25 MED ORDER — IOHEXOL 300 MG/ML  SOLN
80.0000 mL | Freq: Once | INTRAMUSCULAR | Status: AC | PRN
  Administered 2013-03-25: 80 mL via INTRAVENOUS

## 2013-03-25 MED ORDER — SODIUM CHLORIDE 0.9 % IV SOLN
Freq: Once | INTRAVENOUS | Status: AC
  Administered 2013-03-25: 10:00:00 via INTRAVENOUS

## 2013-03-25 MED ORDER — HYDRALAZINE HCL 20 MG/ML IJ SOLN: 5.0000 mg | Freq: Four times a day (QID) | INTRAMUSCULAR | Status: DC | PRN

## 2013-03-25 MED ORDER — ONDANSETRON HCL 4 MG/2ML IJ SOLN
4.0000 mg | Freq: Once | INTRAMUSCULAR | Status: AC
  Administered 2013-03-25: 4 mg via INTRAVENOUS
  Filled 2013-03-25: qty 2

## 2013-03-25 MED ORDER — ALBUTEROL SULFATE HFA 108 (90 BASE) MCG/ACT IN AERS
2.0000 | INHALATION_SPRAY | RESPIRATORY_TRACT | Status: DC | PRN
  Filled 2013-03-25: qty 6.7

## 2013-03-25 MED ORDER — LIDOCAINE HCL 2 % EX GEL
CUTANEOUS | Status: AC
  Administered 2013-03-25: 10
  Filled 2013-03-25: qty 10

## 2013-03-25 MED ORDER — ONDANSETRON HCL 4 MG/2ML IJ SOLN
4.0000 mg | Freq: Three times a day (TID) | INTRAMUSCULAR | Status: AC | PRN
  Filled 2013-03-25: qty 2

## 2013-03-25 MED ORDER — IOHEXOL 300 MG/ML  SOLN
50.0000 mL | Freq: Once | INTRAMUSCULAR | Status: AC | PRN
  Administered 2013-03-25: 50 mL via ORAL

## 2013-03-25 MED ORDER — SODIUM CHLORIDE 0.9 % IJ SOLN: 3.0000 mL | Freq: Two times a day (BID) | INTRAMUSCULAR | Status: DC

## 2013-03-25 MED ORDER — PROMETHAZINE HCL 25 MG/ML IJ SOLN
12.5000 mg | Freq: Once | INTRAMUSCULAR | Status: AC
  Administered 2013-03-25: 12.5 mg via INTRAVENOUS
  Filled 2013-03-25: qty 1

## 2013-03-25 MED ORDER — BIOTENE DRY MOUTH MT LIQD
15.0000 mL | Freq: Two times a day (BID) | OROMUCOSAL | Status: DC
  Administered 2013-03-25 – 2013-03-30 (×5): 15 mL via OROMUCOSAL

## 2013-03-25 MED ORDER — HYDROMORPHONE HCL PF 1 MG/ML IJ SOLN
0.5000 mg | INTRAMUSCULAR | Status: DC | PRN
  Administered 2013-03-25 (×2): 0.5 mg via INTRAVENOUS
  Filled 2013-03-25 (×2): qty 1

## 2013-03-25 MED ORDER — DEXTROSE-NACL 5-0.45 % IV SOLN
INTRAVENOUS | Status: DC
  Administered 2013-03-25 – 2013-03-28 (×4): via INTRAVENOUS

## 2013-03-25 MED ORDER — ONDANSETRON HCL 4 MG PO TABS: 4.0000 mg | ORAL_TABLET | Freq: Four times a day (QID) | ORAL | Status: DC | PRN

## 2013-03-25 MED ORDER — TIOTROPIUM BROMIDE MONOHYDRATE 18 MCG IN CAPS
18.0000 ug | ORAL_CAPSULE | Freq: Every day | RESPIRATORY_TRACT | Status: DC
  Administered 2013-03-26 – 2013-04-08 (×14): 18 ug via RESPIRATORY_TRACT
  Filled 2013-03-25 (×3): qty 5

## 2013-03-25 NOTE — ED Provider Notes (Signed)
History     CSN: [id number]  Arrival date & time 03/25/13  7829   First MD Initiated Contact with Patient 03/25/13 [phone]      Chief Complaint  Patient presents with  . Flank Pain  . Nausea    (Consider location/radiation/quality/duration/timing/severity/associated sxs/prior treatment) Patient is a 79 y.o. male presenting with flank pain. The history is provided by the patient.  Flank Pain This is a new problem. The current episode started yesterday. The problem occurs constantly. The problem has been gradually worsening. Associated symptoms include abdominal pain and nausea. Pertinent negatives include no chest pain, fever, vomiting or weakness. Associated symptoms comments: Left flank pain that radiates into LLQ abdomen, starting last night. No fever. He has had nausea without vomiting. He has a history of colostomy (LLQ abdomen) and urostomy (RLQ abdomen) including right nephrostomy tube (last changed via [facility] on 03/02/13), secondary to complications of prostate cancer treatment. He reports he emptied his colostomy bag this morning per usual habit and there was non-bloody stool only. He denies right flank or abdominal pain. .    Past Medical History  Diagnosis Date  . History of colostomy   . Urostomy stenosis     Ask patient to clarify, per history form dated 04/02/11.  Marland Kitchen Peristomal hernia   . Prostate cancer   . Skin cancer   . Neuropathy     in feet   . COPD (chronic obstructive pulmonary disease)   . Depression   . UTI (lower urinary tract infection)     history   . AAA (abdominal aortic aneurysm)     Past Surgical History  Procedure Laterality Date  . Cystectomy, ileal coduit, colostomy  2002    for severe rectourethral fistula  . Ex lap, lysis of adhesions  2010    for SBO  . Lap repair of parastomal hernias  2009    Dr. Barnetta Chapel [facility],  Hematoma evac POD#1  . Eye surgery      cataracts removed    No family history on file.  History  Substance Use Topics  .  Smoking status: Former Smoker -- 37 years    Types: Cigarettes  . Smokeless tobacco: Never Used  . Alcohol Use: Yes     Comment: wine occasionally with supper       Review of Systems  Constitutional: Negative for fever.  Respiratory: Negative for shortness of breath.   Cardiovascular: Negative for chest pain.  Gastrointestinal: Positive for nausea and abdominal pain. Negative for vomiting, diarrhea and blood in stool.  Genitourinary: Positive for flank pain. Negative for hematuria and testicular pain.  Neurological: Negative for weakness.  Psychiatric/Behavioral: Negative for confusion.    Allergies  Augmentin  Home Medications   Current Outpatient Rx  Name  Route  Sig  Dispense  Refill  . acetaminophen (TYLENOL) 500 MG tablet   Oral   Take 1,000 mg by mouth every 6 (six) hours as needed. For pain         . albuterol (PROVENTIL HFA;VENTOLIN HFA) 108 (90 BASE) MCG/ACT inhaler   Inhalation   Inhale 2 puffs into the lungs every 4 (four) hours as needed. For shortness of breath         . aspirin EC 81 MG tablet   Oral   Take 81 mg by mouth at bedtime.          Marland Kitchen buPROPion (WELLBUTRIN SR) 150 MG 12 hr tablet   Oral   Take 150 mg by mouth  2 (two) times daily.          . Calcium-Magnesium-Vitamin D (CALCIUM MAGNESIUM PO)   Oral   Take 1 tablet by mouth every morning.          . ciprofloxacin (CIPRO) 500 MG tablet   Oral   Take 500 mg by mouth 2 (two) times daily as needed. Taken if patient has kidney infection. Alternating with Hiprex.         . gabapentin (NEURONTIN) 300 MG capsule   Oral   Take 300 mg by mouth 3 (three) times daily.          Marland Kitchen glucosamine-chondroitin 500-400 MG tablet   Oral   Take 1 tablet by mouth 2 (two) times daily.          . hydrophilic ointment   Topical   Apply 1 application topically daily as needed. Applies to feet.         Marland Kitchen ketoconazole (NIZORAL) 2 % shampoo   Topical   Apply 1 application topically 2 (two)  times a week.          . methenamine (HIPREX) 1 G tablet   Oral   Take 1 g by mouth daily as needed. Taken if patient has kidney infection. Alternating with Cipro.         . Multiple Vitamin (MULTIVITAMIN WITH MINERALS) TABS   Oral   Take 1 tablet by mouth every morning.          . nitrofurantoin (MACRODANTIN) 50 MG capsule   Oral   Take 50 mg by mouth at bedtime.         . selenium sulfide (SELSUN) 2.5 % shampoo   Topical   Apply 1 application topically daily as needed (Applies to scalp for dandruff or itching.).          Marland Kitchen simvastatin (ZOCOR) 20 MG tablet   Oral   Take 20 mg by mouth at bedtime.           . sodium chloride (OCEAN) 0.65 % nasal spray   Nasal   Place 1 spray into the nose daily as needed. For nasal decongestant         . sorbitol 70 % solution   Oral   Take 15-30 mLs by mouth daily as needed. For constipation         . tiotropium (SPIRIVA) 18 MCG inhalation capsule   Inhalation   Place 18 mcg into inhaler and inhale at bedtime.         . vitamin C (ASCORBIC ACID) 500 MG tablet   Oral   Take 500 mg by mouth daily.           There were no vitals taken for this visit.  Physical Exam  Constitutional: He is oriented to person, place, and time. He appears well-developed and well-nourished. No distress.  HENT:  Head: Normocephalic.  Mouth/Throat: Oropharynx is clear and moist.  Neck: Normal range of motion.  Pulmonary/Chest: Effort normal. No respiratory distress.  Abdominal: Soft. There is tenderness.  LLQ abdominal tenderness. Colostomy stoma unremarkable. Non-bloody stool in bag. Abdomen is soft with active bowel sounds. Midline surgical scar well healed. No right sided abdominal pain.  Musculoskeletal: Normal range of motion.  Neurological: He is alert and oriented to person, place, and time.  Skin: Skin is warm and dry.  Psychiatric: He has a normal mood and affect.    ED Course  Procedures (including critical care  time)  Labs Reviewed  URINE CULTURE  CBC WITH DIFFERENTIAL  BASIC METABOLIC PANEL  URINALYSIS, ROUTINE W REFLEX MICROSCOPIC   Results for orders placed during the hospital encounter of 03/25/13  CBC WITH DIFFERENTIAL      Result Value Range   WBC 5.3  4.0 - 10.5 K/uL   RBC 4.81  4.22 - 5.81 MIL/uL   Hemoglobin 15.0  13.0 - 17.0 g/dL   HCT 40.9  81.1 - 91.4 %   MCV 89.0  78.0 - 100.0 fL   MCH 31.2  26.0 - 34.0 pg   MCHC 35.0  30.0 - 36.0 g/dL   RDW 78.2  95.6 - 21.3 %   Platelets 164  150 - 400 K/uL   Neutrophils Relative 69  43 - 77 %   Neutro Abs 3.7  1.7 - 7.7 K/uL   Lymphocytes Relative 18  12 - 46 %   Lymphs Abs 1.0  0.7 - 4.0 K/uL   Monocytes Relative 12  3 - 12 %   Monocytes Absolute 0.6  0.1 - 1.0 K/uL   Eosinophils Relative 1  0 - 5 %   Eosinophils Absolute 0.0  0.0 - 0.7 K/uL   Basophils Relative 0  0 - 1 %   Basophils Absolute 0.0  0.0 - 0.1 K/uL  BASIC METABOLIC PANEL      Result Value Range   Sodium 134 (*) 135 - 145 mEq/L   Potassium 4.5  3.5 - 5.1 mEq/L   Chloride 100  96 - 112 mEq/L   CO2 25  19 - 32 mEq/L   Glucose, Bld 118 (*) 70 - 99 mg/dL   BUN 24 (*) 6 - 23 mg/dL   Creatinine, Ser 0.86  0.50 - 1.35 mg/dL   Calcium 9.7  8.4 - 57.8 mg/dL   GFR calc non Af Amer 49 (*) >90 mL/min   GFR calc Af Amer 57 (*) >90 mL/min  URINALYSIS, ROUTINE W REFLEX MICROSCOPIC      Result Value Range   Color, Urine YELLOW  YELLOW   APPearance CLEAR  CLEAR   Specific Gravity, Urine 1.009  1.005 - 1.030   pH 7.5  5.0 - 8.0   Glucose, UA NEGATIVE  NEGATIVE mg/dL   Hgb urine dipstick MODERATE (*) NEGATIVE   Bilirubin Urine NEGATIVE  NEGATIVE   Ketones, ur NEGATIVE  NEGATIVE mg/dL   Protein, ur NEGATIVE  NEGATIVE mg/dL   Urobilinogen, UA 0.2  0.0 - 1.0 mg/dL   Nitrite NEGATIVE  NEGATIVE   Leukocytes, UA TRACE (*) NEGATIVE  URINE MICROSCOPIC-ADD ON      Result Value Range   Squamous Epithelial / LPF RARE  RARE   WBC, UA 0-2  <3 WBC/hpf   RBC / HPF 3-6  <3 RBC/hpf    Bacteria, UA RARE  RARE   Ct Abdomen Pelvis W Contrast  03/25/2013  *RADIOLOGY REPORT*  Clinical Data: 79 year old male with left flank, abdominal and pelvic pain with nausea. History of prostate cancer, cystectomy and urinary diversion/ileal conduit.  Recent urinary stent placement.  CT ABDOMEN AND PELVIS WITH CONTRAST  Technique:  Multidetector CT imaging of the abdomen and pelvis was performed following the standard protocol during bolus administration of intravenous contrast.  Contrast: 100 ml intravenous Omnipaque-300  Comparison: 11/08/2012 CT and02/23/2014 renal ultrasound.  Findings: Bibasilar scarring, emphysema and heavy coronary artery calcifications are identified.  The liver, spleen, pancreas and adrenal glands are unremarkable. Cholelithiasis identified without evidence of acute cholecystitis.  Moderate bilateral hydronephrosis is identified, slightly  decreased on the right since 01/21/2013.  A right urinary stent is identified with proximal tip in the lower renal pelvis/proximal ureter and extending out the ileal conduit.  There is a mild wall thickening of the right renal pelvis and ureter which may represent inflammation or infection. Right renal atrophy and bilateral cortical renal thinning noted.  A 4.7 x 5 cm infrarenal suprailiac abdominal aortic aneurysm with moderate thrombus is unchanged.  There are distended loops of small bowel with a transition point in the left lower pelvis - compatible with a high-grade small bowel obstruction. No definite cause identified.  There is no evidence of pneumoperitoneum or free fluid.  A large wide mouthed left anterior pelvic wall hernia is identified containing colon and which does not cause obstruction.  There is no evidence of biliary dilatation or enlarged lymph nodes. Radioactive seeds in the region of the prostate are noted.  The patient is status post cystectomy.  An ileal conduit is noted.  No acute or suspicious bony abnormalities are identified.  Degenerative changes within the lumbar spine are noted.  A compression fracture of L1 is again identified.  IMPRESSION: High-grade small bowel obstruction with transition point in the low lateral left pelvis without obstructing cause identified - question adhesion.  No evidence of pneumoperitoneum or free fluid.  Moderate bilateral hydronephrosis with right urinary stent as described.  Mild wall enhancement of the right renal pelvis and ureter may represent inflammation/infection.  Unchanged 4.7 x 5 cm infrarenal suprailiac abdominal aortic aneurysm.  Unchanged large left pelvic wall hernia containing colon.  Emphysema, bilateral lower lung scarring and coronary artery disease.   Original Report Authenticated By: Harmon Pier, M.D.    Ir Nephrostomy Tube Change  02/28/2013  *RADIOLOGY REPORT*   Clinical data:  Ileostomy with ureteral stenosis, with long-term indwelling retrograde nephro-ureteral catheter.  RIGHT RETROGRADE NEPHRO-URETERAL  CATHETER EXCHANGE UNDER FLUOROSCOPY  Technique and findings:  The procedure, risks (including but not limited to bleeding, infection, organ damage), benefits, and alternatives were explained to the patient.  Questions regarding the procedure were encouraged and answered.  The patient understands and consents to the procedure. The ostomy site and nephro-ureteral catheter and surrounding skin were prepped with Betadine, draped in usual sterile fashion.  A small amount of contrast was injected through the right retrograde nephro-ureteral catheter to opacify the renal collecting system.  The catheter was cut and exchanged over a 0.035" angiographic wire for a new 10-French pigtail catheter, formed centrally within the collecting system under fluoroscopy.  Contrast injection confirms appropriate positioning.   The patient tolerated the procedure well, with no immediate complication.  IMPRESSION 1.  Technically successful exchange of right retrograde nephro- ureteral   catheter under  fluoroscopy   Original Report Authenticated By: D. Andria Rhein, MD    No results found.   No diagnosis found.  1. SBO 2. Hydronephrosis 3. History colostomy 4. History of urostomy  MDM  Pain has been controlled in ED. Cr. Allows for CT scan which shows a high grade small bowel obstruction. NG tube place. Multiple rechecks find patient comfortable, alert, without vomiting. Admission for obstruction discussed with patient and family.         Arnoldo Hooker, PA-C 03/25/13 1328

## 2013-03-25 NOTE — ED Provider Notes (Signed)
Medical screening examination/treatment/procedure(s) were conducted as a shared visit with non-physician practitioner(s) and myself.  I personally evaluated the patient during the encounter  High grade SBO. NG tube now. [facility], Dr Michaell Cowing, to evaluate. Hospitalist admission. Pain and nausea controlled at this time  1. Small bowel obstruction     Ct Abdomen Pelvis W Contrast  03/25/2013  *RADIOLOGY REPORT*  Clinical Data: 79 year old male with left flank, abdominal and pelvic pain with nausea. History of prostate cancer, cystectomy and urinary diversion/ileal conduit.  Recent urinary stent placement.  CT ABDOMEN AND PELVIS WITH CONTRAST  Technique:  Multidetector CT imaging of the abdomen and pelvis was performed following the standard protocol during bolus administration of intravenous contrast.  Contrast: 100 ml intravenous Omnipaque-300  Comparison: 11/08/2012 CT and02/23/2014 renal ultrasound.  Findings: Bibasilar scarring, emphysema and heavy coronary artery calcifications are identified.  The liver, spleen, pancreas and adrenal glands are unremarkable. Cholelithiasis identified without evidence of acute cholecystitis.  Moderate bilateral hydronephrosis is identified, slightly decreased on the right since 01/21/2013.  A right urinary stent is identified with proximal tip in the lower renal pelvis/proximal ureter and extending out the ileal conduit.  There is a mild wall thickening of the right renal pelvis and ureter which may represent inflammation or infection. Right renal atrophy and bilateral cortical renal thinning noted.  A 4.7 x 5 cm infrarenal suprailiac abdominal aortic aneurysm with moderate thrombus is unchanged.  There are distended loops of small bowel with a transition point in the left lower pelvis - compatible with a high-grade small bowel obstruction. No definite cause identified.  There is no evidence of pneumoperitoneum or free fluid.  A large wide mouthed left anterior pelvic wall hernia  is identified containing colon and which does not cause obstruction.  There is no evidence of biliary dilatation or enlarged lymph nodes. Radioactive seeds in the region of the prostate are noted.  The patient is status post cystectomy.  An ileal conduit is noted.  No acute or suspicious bony abnormalities are identified. Degenerative changes within the lumbar spine are noted.  A compression fracture of L1 is again identified.  IMPRESSION: High-grade small bowel obstruction with transition point in the low lateral left pelvis without obstructing cause identified - question adhesion.  No evidence of pneumoperitoneum or free fluid.  Moderate bilateral hydronephrosis with right urinary stent as described.  Mild wall enhancement of the right renal pelvis and ureter may represent inflammation/infection.  Unchanged 4.7 x 5 cm infrarenal suprailiac abdominal aortic aneurysm.  Unchanged large left pelvic wall hernia containing colon.  Emphysema, bilateral lower lung scarring and coronary artery disease.   Original Report Authenticated By: Harmon Pier, M.D.    Ir Nephrostomy Tube Change  02/28/2013  *RADIOLOGY REPORT*   Clinical data:  Ileostomy with ureteral stenosis, with long-term indwelling retrograde nephro-ureteral catheter.  RIGHT RETROGRADE NEPHRO-URETERAL  CATHETER EXCHANGE UNDER FLUOROSCOPY  Technique and findings:  The procedure, risks (including but not limited to bleeding, infection, organ damage), benefits, and alternatives were explained to the patient.  Questions regarding the procedure were encouraged and answered.  The patient understands and consents to the procedure. The ostomy site and nephro-ureteral catheter and surrounding skin were prepped with Betadine, draped in usual sterile fashion.  A small amount of contrast was injected through the right retrograde nephro-ureteral catheter to opacify the renal collecting system.  The catheter was cut and exchanged over a 0.035" angiographic wire for a new  10-French pigtail catheter, formed centrally within the collecting system under  fluoroscopy.  Contrast injection confirms appropriate positioning.   The patient tolerated the procedure well, with no immediate complication.  IMPRESSION 1.  Technically successful exchange of right retrograde nephro- ureteral   catheter under fluoroscopy   Original Report Authenticated By: D. Andria Rhein, MD   I personally reviewed the imaging tests through PACS system I reviewed available ER/hospitalization records through the EMR   Lyanne Co, MD 03/25/13 1340

## 2013-03-25 NOTE — ED Notes (Signed)
Pt states he has an apt with urologist Tues 4/29 for renal US for right kidney, stent placed - developed cramping pain in the middle of the night on left flank radiating to mid abd. Nausea present, no vomiting. Called urologist, advised pt to come to ED

## 2013-03-25 NOTE — ED Notes (Signed)
Hospitalist at bedside 

## 2013-03-25 NOTE — ED Notes (Signed)
Awaiting urostomy bag arrival from 4th floor.

## 2013-03-25 NOTE — ED Notes (Signed)
Changed pt's urostomy bag, obtained clean urine sample

## 2013-03-25 NOTE — ED Notes (Signed)
4e called for report, RN off floor at this time

## 2013-03-25 NOTE — Plan of Care (Signed)
Problem: Phase I Progression Outcomes Goal: Voiding-avoid urinary catheter unless indicated Outcome: Not Applicable Date Met:  03/25/13 Has a Nephrostomy tube

## 2013-03-25 NOTE — Consult Note (Signed)
Michael Novak  Jul 18, 1930 [id number]  CARE TEAM:  PCP: Kimber Relic, MD  Outpatient Care Team: Patient Care Team: Kimber Relic, MD as PCP - General (Internal Medicine) Marcelyn Bruins, MD as Consulting Physician (Urology)  Inpatient Treatment Team: Treatment Team: Attending Provider: Lyanne Co, MD; Physician Assistant: Arnoldo Hooker, PA-C; Registered Nurse: Pincus Sanes, RN; Technician: Valentina Gu, NT; Consulting Physician: Bishop Limbo, MD  This patient is a 79 y.o.male who presents today for surgical evaluation at the request of Dr. Azalia Bilis, [facility] ED.   Reason for evaluation: Small bowel obstruction.  Pleasant man with complicated surgical history.  Had prostate cancer that was recurrent.  Had radiation seeds.  Developed rectourethral fistula.  Underwent cystectomy with ileal conduit and permanent end colostomy.  Has had bowel obstructions requiring laparotomy.  Developed a parastomal colostomy hernia.  I saw him a few years ago.  He was not having pain or obstruction.  Patient decided to hold off on surgery given his complicated course in numerous abdominal surgeries.  Has a chronic right ureteral stricture which has stents adjusted by interventional radiology.  Followed by Dr. Eudelia Bunch with [facility] urology.  Patient normally has a bowel movement at least every day.  Last one last night.  A few.  Developed some worsening abdominal pain.  Mainly on the left side.  Felt nauseated as well.  He came to the emergency room.  CT scan suspicious for bowel obstruction.  Surgery asked to evaluate.  Past Medical History  Diagnosis Date  . History of colostomy   . Urostomy stenosis     Ask patient to clarify, per history form dated 04/02/11.  . Prostate cancer   . Skin cancer   . Neuropathy     in feet   . COPD (chronic obstructive pulmonary disease)   . Depression   . UTI (lower urinary tract infection)     history   . AAA (abdominal aortic aneurysm)   .  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 Dyslipidemia 12/14/2011  . Bradycardia, HR 30-40s, Nov-Dec2012 11/26/2011  . Rectourethral fistula     s/p cystectomy / ileal conduit / ostomy    Past Surgical History  Procedure Laterality Date  . Cystectomy, ileal coduit, colostomy  2002    for severe rectourethral fistula  . Ex lap, lysis of adhesions  2010    for SBO  . Lap repair of parastomal hernias  2009    Dr. Barnetta [facility],  Hematoma evac POD#1  . Eye surgery      cataracts removed    History   Social History  . Marital Status: Married    Spouse Name: N/A    Number of Children: N/A  . Years of Education: N/A   Occupational History  . Not on file.   Social History Main Topics  . Smoking status: Former Smoker -- 37 years    Types: Cigarettes  . Smokeless tobacco: Never Used  . Alcohol Use: Yes     Comment: wine occasionally with supper   . Drug Use: No  . Sexually Active: No   Other Topics Concern  . Not on file   Social History Narrative  . No narrative on file    No family history on file.  No current facility-administered medications for this encounter.   Current Outpatient Prescriptions  Medication Sig Dispense Refill  . acetaminophen (TYLENOL) 500 MG tablet Take 1,000 mg by mouth every 6 (six)  hours as needed. For pain      . albuterol (PROVENTIL HFA;VENTOLIN HFA) 108 (90 BASE) MCG/ACT inhaler Inhale 2 puffs into the lungs every 4 (four) hours as needed for shortness of breath.       Marland Kitchen aspirin EC 81 MG tablet Take 81 mg by mouth at bedtime.       Marland Kitchen buPROPion (WELLBUTRIN SR) 150 MG 12 hr tablet Take 150 mg by mouth 2 (two) times daily.       . Calcium-Magnesium-Vitamin D (CALCIUM MAGNESIUM PO) Take 1 tablet by mouth every morning.       . ciprofloxacin (CIPRO) 500 MG tablet Take 500 mg by mouth 2 (two) times daily as needed. Taken if patient has kidney infection. Alternating with Hiprex.      . gabapentin (NEURONTIN) 300 MG capsule Take 300 mg by  mouth 3 (three) times daily.       Marland Kitchen glucosamine-chondroitin 500-400 MG tablet Take 1 tablet by mouth 2 (two) times daily.       . hydrophilic ointment Apply 1 application topically daily as needed. Applies to feet.      Marland Kitchen ketoconazole (NIZORAL) 2 % shampoo Apply 1 application topically 2 (two) times a week.       . methenamine (HIPREX) 1 G tablet Take 1 g by mouth daily as needed. Taken if patient has kidney infection. Alternating with Cipro.      . Multiple Vitamin (MULTIVITAMIN WITH MINERALS) TABS Take 1 tablet by mouth every morning.       . nitrofurantoin (MACRODANTIN) 50 MG capsule Take 50 mg by mouth at bedtime.      . selenium sulfide (SELSUN) 2.5 % shampoo Apply 1 application topically daily as needed (Applies to scalp for dandruff or itching.).       Marland Kitchen simvastatin (ZOCOR) 20 MG tablet Take 20 mg by mouth at bedtime.        . sodium chloride (OCEAN) 0.65 % nasal spray Place 1 spray into the nose daily as needed. For nasal decongestant      . sorbitol 70 % solution Take 15-30 mLs by mouth daily as needed. For constipation      . tiotropium (SPIRIVA) 18 MCG inhalation capsule Place 18 mcg into inhaler and inhale at bedtime.      . vitamin C (ASCORBIC ACID) 500 MG tablet Take 500 mg by mouth daily.         Allergies  Allergen Reactions  . Augmentin (Amoxicillin-Pot Clavulanate) Other (See Comments)    Reaction unknown.    ROS: Constitutional:  No fevers, chills, sweats.  Weight stable Eyes:  No vision changes, No discharge HENT:  No sore throats, nasal drainage Lymph: No neck swelling, No bruising easily Pulmonary:  No cough, productive sputum CV: No orthopnea, PND  Patient walks 15 minutes for about 1/4 miles without difficulty.  No exertional chest/neck/shoulder/arm pain. GI: No personal nor family history of GI/colon cancer, inflammatory bowel disease, irritable bowel syndrome, allergy such as Celiac Sprue, dietary/dairy problems, colitis, ulcers nor gastritis.  No recent sick  contacts/gastroenteritis.  No travel outside the country.  No changes in diet. Renal: No UTIs, No hematuria Genital:  No drainage, bleeding, masses Musculoskeletal: No severe joint pain.  Good ROM major joints Skin:  No sores or lesions.  No rashes Heme/Lymph:  No easy bleeding.  No swollen lymph nodes Neuro: No focal weakness/numbness.  No seizures Psych: No suicidal ideation.  No hallucinations  BP 154/96  Pulse 54  Temp(Src) 97.6  F (36.4 C) (Oral)  Resp 20  SpO2 96%  Physical Exam: General: Pt awake/alert/oriented x4 in no major acute distress.  Smiling/affable Eyes: PERRL, normal EOM. Sclera nonicteric Neuro: CN II-XII intact w/o focal sensory/motor deficits. Lymph: No head/neck/groin lymphadenopathy Psych:  No delerium/psychosis/paranoia HENT: Normocephalic, Mucus membranes moist.  No thrush.  NGT in place with thick tan return x in canister Neck: Supple, No tracheal deviation Chest: No pain.  Good respiratory excursion. CV:  Pulses intact.  Regular rhythm Abdomen: Soft, Nondistended.  Nontender.  LLQ colostomy with large but soft reducible hernia.  Minimal gas/stool (no output today per patient). Urostomy with stent & clear urine.  Soft.  No incarcerated hernias. Ext:  SCDs BLE.  No significant edema.  No cyanosis Skin: No petechiae / purpurea.  No major sores Musculoskeletal: No severe joint pain.  Good ROM major joints   Results:   Labs: Results for orders placed during the hospital encounter of 03/25/13 (from the past 48 hour(s))  CBC WITH DIFFERENTIAL     Status: None   Collection Time    03/25/13 10:02 AM      Result Value Range   WBC 5.3  4.0 - 10.5 K/uL   RBC 4.81  4.22 - 5.81 MIL/uL   Hemoglobin 15.0  13.0 - 17.0 g/dL   HCT 16.1  09.6 - 04.5 %   MCV 89.0  78.0 - 100.0 fL   MCH 31.2  26.0 - 34.0 pg   MCHC 35.0  30.0 - 36.0 g/dL   RDW 40.9  81.1 - 91.4 %   Platelets 164  150 - 400 K/uL   Neutrophils Relative 69  43 - 77 %   Neutro Abs 3.7  1.7 -  7.7 K/uL   Lymphocytes Relative 18  12 - 46 %   Lymphs Abs 1.0  0.7 - 4.0 K/uL   Monocytes Relative 12  3 - 12 %   Monocytes Absolute 0.6  0.1 - 1.0 K/uL   Eosinophils Relative 1  0 - 5 %   Eosinophils Absolute 0.0  0.0 - 0.7 K/uL   Basophils Relative 0  0 - 1 %   Basophils Absolute 0.0  0.0 - 0.1 K/uL  BASIC METABOLIC PANEL     Status: Abnormal   Collection Time    03/25/13 10:02 AM      Result Value Range   Sodium 134 (*) 135 - 145 mEq/L   Potassium 4.5  3.5 - 5.1 mEq/L   Chloride 100  96 - 112 mEq/L   CO2 25  19 - 32 mEq/L   Glucose, Bld 118 (*) 70 - 99 mg/dL   BUN 24 (*) 6 - 23 mg/dL   Creatinine, Ser 7.82  0.50 - 1.35 mg/dL   Calcium 9.7  8.4 - 95.6 mg/dL   GFR calc non Af Amer 49 (*) >90 mL/min   GFR calc Af Amer 57 (*) >90 mL/min   Comment:            The eGFR has been calculated     using the CKD EPI equation.     This calculation has not been     validated in all clinical     situations.     eGFR's persistently     <90 mL/min signify     possible Chronic Kidney Disease.  URINALYSIS, ROUTINE W REFLEX MICROSCOPIC     Status: Abnormal   Collection Time    03/25/13 11:07 AM  Result Value Range   Color, Urine YELLOW  YELLOW   APPearance CLEAR  CLEAR   Specific Gravity, Urine 1.009  1.005 - 1.030   pH 7.5  5.0 - 8.0   Glucose, UA NEGATIVE  NEGATIVE mg/dL   Hgb urine dipstick MODERATE (*) NEGATIVE   Bilirubin Urine NEGATIVE  NEGATIVE   Ketones, ur NEGATIVE  NEGATIVE mg/dL   Protein, ur NEGATIVE  NEGATIVE mg/dL   Urobilinogen, UA 0.2  0.0 - 1.0 mg/dL   Nitrite NEGATIVE  NEGATIVE   Leukocytes, UA TRACE (*) NEGATIVE  URINE MICROSCOPIC-ADD ON     Status: None   Collection Time    03/25/13 11:07 AM      Result Value Range   Squamous Epithelial / LPF RARE  RARE   WBC, UA 0-2  <3 WBC/hpf   RBC / HPF 3-6  <3 RBC/hpf   Bacteria, UA RARE  RARE    Imaging / Studies: Ct Abdomen Pelvis W Contrast  03/25/2013  *RADIOLOGY REPORT*  Clinical Data: 79 year old male  with left flank, abdominal and pelvic pain with nausea. History of prostate cancer, cystectomy and urinary diversion/ileal conduit.  Recent urinary stent placement.  CT ABDOMEN AND PELVIS WITH CONTRAST  Technique:  Multidetector CT imaging of the abdomen and pelvis was performed following the standard protocol during bolus administration of intravenous contrast.  Contrast: 100 ml intravenous Omnipaque-300  Comparison: 11/08/2012 CT and02/23/2014 renal ultrasound.  Findings: Bibasilar scarring, emphysema and heavy coronary artery calcifications are identified.  The liver, spleen, pancreas and adrenal glands are unremarkable. Cholelithiasis identified without evidence of acute cholecystitis.  Moderate bilateral hydronephrosis is identified, slightly decreased on the right since 01/21/2013.  A right urinary stent is identified with proximal tip in the lower renal pelvis/proximal ureter and extending out the ileal conduit.  There is a mild wall thickening of the right renal pelvis and ureter which may represent inflammation or infection. Right renal atrophy and bilateral cortical renal thinning noted.  A 4.7 x 5 cm infrarenal suprailiac abdominal aortic aneurysm with moderate thrombus is unchanged.  There are distended loops of small bowel with a transition point in the left lower pelvis - compatible with a high-grade small bowel obstruction. No definite cause identified.  There is no evidence of pneumoperitoneum or free fluid.  A large wide mouthed left anterior pelvic wall hernia is identified containing colon and which does not cause obstruction.  There is no evidence of biliary dilatation or enlarged lymph nodes. Radioactive seeds in the region of the prostate are noted.  The patient is status post cystectomy.  An ileal conduit is noted.  No acute or suspicious bony abnormalities are identified. Degenerative changes within the lumbar spine are noted.  A compression fracture of L1 is again identified.  IMPRESSION:  High-grade small bowel obstruction with transition point in the low lateral left pelvis without obstructing cause identified - question adhesion.  No evidence of pneumoperitoneum or free fluid.  Moderate bilateral hydronephrosis with right urinary stent as described.  Mild wall enhancement of the right renal pelvis and ureter may represent inflammation/infection.  Unchanged 4.7 x 5 cm infrarenal suprailiac abdominal aortic aneurysm.  Unchanged large left pelvic wall hernia containing colon.  Emphysema, bilateral lower lung scarring and coronary artery disease.   Original Report Authenticated By: Harmon Pier, M.D.    Ir Nephrostomy Tube Change  02/28/2013  *RADIOLOGY REPORT*   Clinical data:  Ileostomy with ureteral stenosis, with long-term indwelling retrograde nephro-ureteral catheter.  RIGHT RETROGRADE NEPHRO-URETERAL  CATHETER EXCHANGE UNDER FLUOROSCOPY  Technique and findings:  The procedure, risks (including but not limited to bleeding, infection, organ damage), benefits, and alternatives were explained to the patient.  Questions regarding the procedure were encouraged and answered.  The patient understands and consents to the procedure. The ostomy site and nephro-ureteral catheter and surrounding skin were prepped with Betadine, draped in usual sterile fashion.  A small amount of contrast was injected through the right retrograde nephro-ureteral catheter to opacify the renal collecting system.  The catheter was cut and exchanged over a 0.035" angiographic wire for a new 10-French pigtail catheter, formed centrally within the collecting system under fluoroscopy.  Contrast injection confirms appropriate positioning.   The patient tolerated the procedure well, with no immediate complication.  IMPRESSION 1.  Technically successful exchange of right retrograde nephro- ureteral   catheter under fluoroscopy   Original Report Authenticated By: D. Andria Rhein, MD     Medications / Allergies: per  chart  Antibiotics: Anti-infectives   None      Assessment  Michael Novak  79 y.o. male       Problem List:  Principal Problem:   SBO (small bowel obstruction) Active Problems:   Prostate cancer   Parastomal hernia at left colostomy   COPD (chronic obstructive pulmonary disease)   AAA (abdominal aortic aneurysm)   H/O colostomy   Rectourethral fistula   Ureteral stricture, right with chronic nephroureteral stenting    Hernia of pelvic floor   SBO   Plan:  -IVF -NGT -Xrays in AM -VTE prophylaxis- SCDs, etc -mobilize as tolerated to help recovery -f/u urine culture   I would try and hold off on any operation in this patient.  Given his history of pelvic radiation & numerous prior surgeries, his risk for enterotomy, hernia, fistula, etc is higher than average.    The patient is stable.  There is no evidence of peritonitis, acute abdomen, nor shock.  There is no strong evidence of failure of improvement nor decline with current non-operative management.  There is no need for surgery at the present moment.  We will continue to follow.    Ardeth Sportsman, M.D., F.A.C.S. Gastrointestinal and Minimally Invasive Surgery [facility] [address], [address] [place], [place] [address] [phone] Main / Paging   03/25/2013

## 2013-03-25 NOTE — H&P (Signed)
[facility] History and Physical  Michael Novak:[id number] DOB: [birth year] DOA: 03/25/2013  Referring physician: Dr. Patria Mane PCP: Kimber Relic, MD  Specialists: Surgery, Dr. Michaell Cowing  Chief Complaint: left flank and left lower quadrant abdominal pain   HPI: Michael Novak is a 79 y.o. male has a past medical history significant for prostate cancer (urologist is Dr. Logan Bores) s/p radioactive seed implantation, complicated by prostatic rectal fistula status post cystoprostatectomy with ileal conduit as well as proctocolectomy with colostomy, as well as right-sided nephrostomy placed in July of 2013 (exchanged every 6 weeks), abdominal aortic aneurysm followed by vascular surgery as an outpatient with a stable appearance on repeat imaging, COPD, presents with a chief complaint of left flank and left lower quadrant abdominal pain that started yesterday. He denies any fever or chills. He endorses good colostomy output up until last night. He endorses nausea without vomiting. He has no chest pain, no breathing difficulties, he denies lightheadedness or dizziness. In the emergency room a CT scan of the abdomen was done which showed a high-grade small bowel obstruction in the lower lateral left pelvis without obvious obstructing cause. Surgery has been consulted, and due to multiple medical problems hospitalist service was asked for admission. Patient currently is in no acute distress, has an NG tube in place feels much more comfortable than when he came in. He denies any complaints and states that his abdominal pain is better in the ED.   Review of Systems: The patient denies anorexia, fever, weight loss, vision loss, decreased hearing, hoarseness, chest pain, syncope, dyspnea on exertion, peripheral edema, balance deficits, hemoptysis, abdominal pain, melena, hematochezia, severe indigestion/heartburn, hematuria, incontinence, muscle weakness, abnormal bleeding  Past Medical History  Diagnosis Date   . History of colostomy   . Urostomy stenosis     Ask patient to clarify, per history form dated 04/02/11.  . Prostate cancer   . Skin cancer   . Neuropathy     in feet   . COPD (chronic obstructive pulmonary disease)   . Depression   . UTI (lower urinary tract infection)     history   . AAA (abdominal aortic aneurysm)   . 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 Dyslipidemia 12/14/2011  . Bradycardia, HR 30-40s, Nov-Dec2012 11/26/2011  . Rectourethral fistula     s/p cystectomy / ileal conduit / ostomy   Past Surgical History  Procedure Laterality Date  . Cystectomy, ileal coduit, colostomy  2002    for severe rectourethral fistula  . Ex lap, lysis of adhesions  2010    for SBO  . Lap repair of parastomal hernias  2009    Dr. Barnetta Chapel [facility],  Hematoma evac POD#1  . Eye surgery      cataracts removed   Social History:  reports that he has quit smoking. His smoking use included Cigarettes. He smoked 0.00 packs per day for 37 years. He has never used smokeless tobacco. He reports that  drinks alcohol. He reports that he does not use illicit drugs.  Allergies  Allergen Reactions  . Augmentin (Amoxicillin-Pot Clavulanate) Other (See Comments)    Reaction unknown.    Family history noncontributory  Prior to Admission medications   Medication Sig Start Date End Date Taking? Authorizing Provider  acetaminophen (TYLENOL) 500 MG tablet Take 1,000 mg by mouth every 6 (six) hours as needed. For pain   Yes Historical Provider, MD  albuterol (PROVENTIL HFA;VENTOLIN HFA) 108 (90 BASE) MCG/ACT inhaler Inhale 2 puffs into the  lungs every 4 (four) hours as needed for shortness of breath.    Yes Historical Provider, MD  aspirin EC 81 MG tablet Take 81 mg by mouth at bedtime.    Yes Historical Provider, MD  buPROPion (WELLBUTRIN SR) 150 MG 12 hr tablet Take 150 mg by mouth 2 (two) times daily.    Yes Historical Provider, MD  Calcium-Magnesium-Vitamin D (CALCIUM MAGNESIUM PO) Take 1  tablet by mouth every morning.    Yes Historical Provider, MD  ciprofloxacin (CIPRO) 500 MG tablet Take 500 mg by mouth 2 (two) times daily as needed. Taken if patient has kidney infection. Alternating with Hiprex.   Yes Historical Provider, MD  gabapentin (NEURONTIN) 300 MG capsule Take 300 mg by mouth 3 (three) times daily.    Yes Historical Provider, MD  glucosamine-chondroitin 500-400 MG tablet Take 1 tablet by mouth 2 (two) times daily.    Yes Historical Provider, MD  hydrophilic ointment Apply 1 application topically daily as needed. Applies to feet.   Yes Historical Provider, MD  ketoconazole (NIZORAL) 2 % shampoo Apply 1 application topically 2 (two) times a week.  08/12/12  Yes Historical Provider, MD  methenamine (HIPREX) 1 G tablet Take 1 g by mouth daily as needed. Taken if patient has kidney infection. Alternating with Cipro.   Yes Historical Provider, MD  Multiple Vitamin (MULTIVITAMIN WITH MINERALS) TABS Take 1 tablet by mouth every morning.    Yes Historical Provider, MD  nitrofurantoin (MACRODANTIN) 50 MG capsule Take 50 mg by mouth at bedtime.   Yes Historical Provider, MD  selenium sulfide (SELSUN) 2.5 % shampoo Apply 1 application topically daily as needed (Applies to scalp for dandruff or itching.).  10/19/12  Yes Historical Provider, MD  simvastatin (ZOCOR) 20 MG tablet Take 20 mg by mouth at bedtime.     Yes Historical Provider, MD  sodium chloride (OCEAN) 0.65 % nasal spray Place 1 spray into the nose daily as needed. For nasal decongestant   Yes Historical Provider, MD  sorbitol 70 % solution Take 15-30 mLs by mouth daily as needed. For constipation   Yes Historical Provider, MD  tiotropium (SPIRIVA) 18 MCG inhalation capsule Place 18 mcg into inhaler and inhale at bedtime. 07/02/12 07/02/13 Yes Christina P Rama, MD  vitamin C (ASCORBIC ACID) 500 MG tablet Take 500 mg by mouth daily.   Yes Historical Provider, MD   Physical Exam: Filed Vitals:   03/25/13 1019 03/25/13 1053  BP:  154/96   Pulse: 54   Temp:  97.6 F (36.4 C)  TempSrc:  Oral  Resp: 20   SpO2: 96%      General:  NAD, pleasant Caucasian male  Eyes: PERRL, EOMI  ENT: moist oropharynx, NG tube in place  Neck: supple, no JVD  Cardiovascular: RRR without MRG   Respiratory: CTA biL, good air movement without wheezing, rhonchi or crackled  Abdomen: soft, non tender to palpation, no bowel sounds. Colostomy and ileostomy bag in place.   Skin: no rashes  Musculoskeletal: no peripheral edema  Psychiatric: normal mood and affect  Neurologic: CN 2-12 grossly intact, MS 5/5 in all 4  Labs on Admission:  Basic Metabolic Panel:  Recent Labs Lab 03/25/13 1002  NA 134*  K 4.5  CL 100  CO2 25  GLUCOSE 118*  BUN 24*  CREATININE 1.33  CALCIUM 9.7   CBC:  Recent Labs Lab 03/25/13 1002  WBC 5.3  NEUTROABS 3.7  HGB 15.0  HCT 42.8  MCV 89.0  PLT  164    Radiological Exams on Admission: Ct Abdomen Pelvis W Contrast  03/25/2013  *RADIOLOGY REPORT*  Clinical Data: 79 year old male with left flank, abdominal and pelvic pain with nausea. History of prostate cancer, cystectomy and urinary diversion/ileal conduit.  Recent urinary stent placement.  CT ABDOMEN AND PELVIS WITH CONTRAST  Technique:  Multidetector CT imaging of the abdomen and pelvis was performed following the standard protocol during bolus administration of intravenous contrast.  Contrast: 100 ml intravenous Omnipaque-300  Comparison: 11/08/2012 CT and02/23/2014 renal ultrasound.  Findings: Bibasilar scarring, emphysema and heavy coronary artery calcifications are identified.  The liver, spleen, pancreas and adrenal glands are unremarkable. Cholelithiasis identified without evidence of acute cholecystitis.  Moderate bilateral hydronephrosis is identified, slightly decreased on the right since 01/21/2013.  A right urinary stent is identified with proximal tip in the lower renal pelvis/proximal ureter and extending out the ileal conduit.   There is a mild wall thickening of the right renal pelvis and ureter which may represent inflammation or infection. Right renal atrophy and bilateral cortical renal thinning noted.  A 4.7 x 5 cm infrarenal suprailiac abdominal aortic aneurysm with moderate thrombus is unchanged.  There are distended loops of small bowel with a transition point in the left lower pelvis - compatible with a high-grade small bowel obstruction. No definite cause identified.  There is no evidence of pneumoperitoneum or free fluid.  A large wide mouthed left anterior pelvic wall hernia is identified containing colon and which does not cause obstruction.  There is no evidence of biliary dilatation or enlarged lymph nodes. Radioactive seeds in the region of the prostate are noted.  The patient is status post cystectomy.  An ileal conduit is noted.  No acute or suspicious bony abnormalities are identified. Degenerative changes within the lumbar spine are noted.  A compression fracture of L1 is again identified.  IMPRESSION: High-grade small bowel obstruction with transition point in the low lateral left pelvis without obstructing cause identified - question adhesion.  No evidence of pneumoperitoneum or free fluid.  Moderate bilateral hydronephrosis with right urinary stent as described.  Mild wall enhancement of the right renal pelvis and ureter may represent inflammation/infection.  Unchanged 4.7 x 5 cm infrarenal suprailiac abdominal aortic aneurysm.  Unchanged large left pelvic wall hernia containing colon.  Emphysema, bilateral lower lung scarring and coronary artery disease.   Original Report Authenticated By: Harmon Pier, M.D.    EKG: Independently reviewed.  Assessment/Plan Principal Problem:   SBO (small bowel obstruction) Active Problems:   Prostate cancer   Parastomal hernia at left colostomy   COPD (chronic obstructive pulmonary disease)   AAA (abdominal aortic aneurysm)   H/O colostomy   Rectourethral fistula    Ureteral stricture, right with chronic nephroureteral stenting    Hernia of pelvic floor   Small bowel obstruction - Surgery has been consulted by the ED physician, appreciate their input. - Conservative management for now, patient to remain n.p.o., NG tube in place - D5 half-normal as maintenance fluids - zofran for nausea   AAA - stable per imaging  Ureteral stricture - nephrostomy in place, renal function close to baseline (~1.2) - monitor UOP  COPD - Inhalers when necessary  Hyperlipidemia - hold oral meds while strict NPO.   DVT prophylaxis - heparin s.q. Since no planned surgery  Code Status: DNR  Family Communication: none  Disposition Plan: inpatient  Time spent: 22  Eliasar Hlavaty M. Elvera Lennox, MD [facility] Pager [phone]  If 7PM-7AM, please contact night-coverage [email or web address]  Password [email or web address] 03/25/2013, 1:37 PM

## 2013-03-26 ENCOUNTER — Inpatient Hospital Stay (HOSPITAL_COMMUNITY): Payer: Medicare Other

## 2013-03-26 DIAGNOSIS — I498 Other specified cardiac arrhythmias: Secondary | ICD-10-CM

## 2013-03-26 LAB — BASIC METABOLIC PANEL
Calcium: 9.1 mg/dL (ref 8.4–10.5)
GFR calc Af Amer: 53 mL/min — ABNORMAL LOW (ref >90)
GFR calc non Af Amer: 45 mL/min — ABNORMAL LOW (ref >90)
Sodium: 136 mEq/L (ref 135–145)

## 2013-03-26 LAB — URINE CULTURE
Colony Count: NO GROWTH
Culture: NO GROWTH

## 2013-03-26 MED ORDER — PROMETHAZINE HCL 25 MG/ML IJ SOLN
12.5000 mg | Freq: Four times a day (QID) | INTRAMUSCULAR | Status: DC | PRN
  Administered 2013-03-26 – 2013-04-02 (×8): 12.5 mg via INTRAVENOUS
  Filled 2013-03-26 (×9): qty 1

## 2013-03-26 MED ORDER — HYDROMORPHONE HCL PF 1 MG/ML IJ SOLN
0.5000 mg | INTRAMUSCULAR | Status: DC | PRN
  Administered 2013-03-26 – 2013-03-27 (×5): 0.5 mg via INTRAVENOUS
  Filled 2013-03-26 (×5): qty 1

## 2013-03-26 MED ORDER — PROMETHAZINE HCL 25 MG RE SUPP: 12.5000 mg | Freq: Four times a day (QID) | RECTAL | Status: DC | PRN

## 2013-03-26 MED ORDER — PROMETHAZINE HCL 25 MG PO TABS: 12.5000 mg | ORAL_TABLET | Freq: Four times a day (QID) | ORAL | Status: DC | PRN

## 2013-03-26 MED ORDER — MENTHOL 3 MG MT LOZG
1.0000 | LOZENGE | OROMUCOSAL | Status: DC | PRN
  Administered 2013-03-26 (×2): 3 mg via ORAL
  Filled 2013-03-26 (×2): qty 9

## 2013-03-26 NOTE — Progress Notes (Signed)
Subjective: Feels very weak and still having nausea.  He has about a liter in his cannister, but I am not sure how well it's working.  Objective: Vital signs in last 24 hours: Temp:  [97.6 F (36.4 C)-98.8 F (37.1 C)] 98.8 F (37.1 C) (04/28 0603) Pulse Rate:  [50-84] 61 (04/28 0603) Resp:  [16-20] 16 (04/28 0603) BP: (109-154)/(65-96) 109/68 mmHg (04/28 0603) SpO2:  [89 %-97 %] 89 % (04/28 0603) Weight:  [88.724 kg (195 lb 9.6 oz)] 88.724 kg (195 lb 9.6 oz) (04/27 1556) Last BM Date: 03/24/13 1050 ml from NG recorded yesterday.afebrile, VSS, no labs today.  Intake/Output from previous day: 04/27 0701 - 04/28 0700 In: 1527.5 [P.O.:120; I.V.:1407.5] Out: 2275 [Urine:1225; Emesis/NG output:1050] Intake/Output this shift:    General appearance: alert, cooperative, no distress and elderly, chronically ill. GI: + high pitched bowel sounds, nothing in colostomy, no gas or recent stool, still a little distended.  Lab Results:   Recent Labs  03/25/13 1002  WBC 5.3  HGB 15.0  HCT 42.8  PLT 164    BMET  Recent Labs  03/25/13 1002  NA 134*  K 4.5  CL 100  CO2 25  GLUCOSE 118*  BUN 24*  CREATININE 1.33  CALCIUM 9.7   PT/INR No results found for this basename: LABPROT, INR,  in the last 72 hours  No results found for this basename: AST, ALT, ALKPHOS, BILITOT, PROT, ALBUMIN,  in the last 168 hours   Lipase     Component Value Date/Time   LIPASE 23 06/21/2012 1630     Studies/Results: Ct Abdomen Pelvis W Contrast  03/25/2013  *RADIOLOGY REPORT*  Clinical Data: 79 year old male with left flank, abdominal and pelvic pain with nausea. History of prostate cancer, cystectomy and urinary diversion/ileal conduit.  Recent urinary stent placement.  CT ABDOMEN AND PELVIS WITH CONTRAST  Technique:  Multidetector CT imaging of the abdomen and pelvis was performed following the standard protocol during bolus administration of intravenous contrast.  Contrast: 100 ml  intravenous Omnipaque-300  Comparison: 11/08/2012 CT and02/23/2014 renal ultrasound.  Findings: Bibasilar scarring, emphysema and heavy coronary artery calcifications are identified.  The liver, spleen, pancreas and adrenal glands are unremarkable. Cholelithiasis identified without evidence of acute cholecystitis.  Moderate bilateral hydronephrosis is identified, slightly decreased on the right since 01/21/2013.  A right urinary stent is identified with proximal tip in the lower renal pelvis/proximal ureter and extending out the ileal conduit.  There is a mild wall thickening of the right renal pelvis and ureter which may represent inflammation or infection. Right renal atrophy and bilateral cortical renal thinning noted.  A 4.7 x 5 cm infrarenal suprailiac abdominal aortic aneurysm with moderate thrombus is unchanged.  There are distended loops of small bowel with a transition point in the left lower pelvis - compatible with a high-grade small bowel obstruction. No definite cause identified.  There is no evidence of pneumoperitoneum or free fluid.  A large wide mouthed left anterior pelvic wall hernia is identified containing colon and which does not cause obstruction.  There is no evidence of biliary dilatation or enlarged lymph nodes. Radioactive seeds in the region of the prostate are noted.  The patient is status post cystectomy.  An ileal conduit is noted.  No acute or suspicious bony abnormalities are identified. Degenerative changes within the lumbar spine are noted.  A compression fracture of L1 is again identified.  IMPRESSION: High-grade small bowel obstruction with transition point in the low lateral left pelvis without  obstructing cause identified - question adhesion.  No evidence of pneumoperitoneum or free fluid.  Moderate bilateral hydronephrosis with right urinary stent as described.  Mild wall enhancement of the right renal pelvis and ureter may represent inflammation/infection.  Unchanged 4.7 x 5  cm infrarenal suprailiac abdominal aortic aneurysm.  Unchanged large left pelvic wall hernia containing colon.  Emphysema, bilateral lower lung scarring and coronary artery disease.   Original Report Authenticated By: Harmon Pier, M.D.     Medications: . antiseptic oral rinse  15 mL Mouth Rinse BID  . heparin subcutaneous  5,000 Units Subcutaneous Q8H  . tiotropium  18 mcg Inhalation QHS    Assessment/Plan SBO, prior Exploratory lap for SBO and lysis of adhesions. 2010 Hx of  Recurrent Prostate CA with radiation seeds Rectourethral fistula with cystectomy, ileal conduit and permanent end colostomy, 2002 Parastomal colostomy hernia Repair of parastomal Hernia 2009 Chronic right ureteral stricture with stnets by IR COPD Depression AAA    Plan:  Recheck 2 view abd.  Continue NG.  I'm concerned it's now down far enough.  Continue bowel rest and hydration.  LOS: 1 day    Arcadia Gorgas 03/26/2013

## 2013-03-26 NOTE — Progress Notes (Signed)
[facility] PROGRESS NOTE  Michael RENSTROM ZOX:[id number] DOB: 07/26/1930 DOA: 03/25/2013 PCP: Kimber Relic, MD  HPI: 79 y.o. male has a past medical history significant for prostate cancer (urologist is Dr. Logan Bores) s/p radioactive seed implantation, complicated by prostatic rectal fistula status post cystoprostatectomy with ileal conduit as well as proctocolectomy with colostomy, as well as right-sided nephrostomy placed in July of 2013 (exchanged every 6 weeks), abdominal aortic aneurysm followed by vascular surgery as an outpatient with a stable appearance on repeat imaging, COPD, presents with a chief complaint of left flank and left lower quadrant abdominal pain that started yesterday. He denies any fever or chills. He endorses good colostomy output up until last night. He endorses nausea without vomiting. He has no chest pain, no breathing difficulties, he denies lightheadedness or dizziness. In the emergency room a CT scan of the abdomen was done which showed a high-grade small bowel obstruction in the lower lateral left pelvis without obvious obstructing cause. Surgery has been consulted, and due to multiple medical problems hospitalist service was asked for admission. Patient currently is in no acute distress, has an NG tube in place feels much more comfortable than when he came in. He denies any complaints and states that his abdominal pain is better in the ED.   Assessment/Plan:  Small bowel obstruction  - Surgery has been consulted by the ED physician, appreciate their input.  - Conservative management for now, patient to remain n.p.o., NG tube in place  - D5 half-normal as maintenance fluids  - phenergan for nausea, pain control  AAA  - stable per imaging   Bradycardia/1st degree heart block - 2 sec pause on monitor overnight, asymptomatic, in sinus rhythm - probably does not need new interventions, but will defer that decision to cardiology, I have kindly asked cardiology to  see him, he is a patient of Dr. Katrinka Blazing from [facility]  Ureteral stricture  - nephrostomy in place, renal function close to baseline (~1.2)  - monitor UOP   COPD  - Inhalers when necessary   Hyperlipidemia  - hold oral meds while strict NPO.   DVT prophylaxis  - heparin s.q. Since no planned surgery   Code Status: DNR Family Communication: none  Disposition Plan: remain inpatient  Consultants:  Surgery  Cardiolgogy  Procedures:  none  Antibiotics:  Anti-infectives   None     Antibiotics Given (last 72 hours)   None      Past antibiotics none  HPI/Subjective: - feels worse this morning, nausea and pain have increased   Objective: Filed Vitals:   03/25/13 1611 03/25/13 2027 03/25/13 2223 03/26/13 0603  BP: 129/89 122/66 130/65 109/68  Pulse: 80 65 50 61  Temp: 97.9 F (36.6 C) 98.2 F (36.8 C) 98 F (36.7 C) 98.8 F (37.1 C)  TempSrc: Oral Oral Oral Oral  Resp:  16 20 16   Height:      Weight:      SpO2: 93% 95% 97% 89%    Intake/Output Summary (Last 24 hours) at 03/26/13 0949 Last data filed at 03/26/13 0700  Gross per 24 hour  Intake 1527.5 ml  Output   2275 ml  Net -747.5 ml   Filed Weights   03/25/13 1556  Weight: 88.724 kg (195 lb 9.6 oz)    Exam:   General:  NAD  Cardiovascular: regular rate and rhythm, bradycardic  Respiratory: good air movement, clear to auscultation throughout, no wheezing, ronchi or rales  Abdomen: soft, mildly tender to palpation  MSK: no peripheral edema  Neuro: CN 2-12 grossly intact, MS 5/5 in all 4  Data Reviewed: Basic Metabolic Panel:  Recent Labs Lab 03/25/13 1002  NA 134*  K 4.5  CL 100  CO2 25  GLUCOSE 118*  BUN 24*  CREATININE 1.33  CALCIUM 9.7   CBC:  Recent Labs Lab 03/25/13 1002  WBC 5.3  NEUTROABS 3.7  HGB 15.0  HCT 42.8  MCV 89.0  PLT 164   Studies: Ct Abdomen Pelvis W Contrast  03/25/2013  *RADIOLOGY REPORT*  Clinical Data: 79 year old male with left flank,  abdominal and pelvic pain with nausea. History of prostate cancer, cystectomy and urinary diversion/ileal conduit.  Recent urinary stent placement.  CT ABDOMEN AND PELVIS WITH CONTRAST  Technique:  Multidetector CT imaging of the abdomen and pelvis was performed following the standard protocol during bolus administration of intravenous contrast.  Contrast: 100 ml intravenous Omnipaque-300  Comparison: 11/08/2012 CT and02/23/2014 renal ultrasound.  Findings: Bibasilar scarring, emphysema and heavy coronary artery calcifications are identified.  The liver, spleen, pancreas and adrenal glands are unremarkable. Cholelithiasis identified without evidence of acute cholecystitis.  Moderate bilateral hydronephrosis is identified, slightly decreased on the right since 01/21/2013.  A right urinary stent is identified with proximal tip in the lower renal pelvis/proximal ureter and extending out the ileal conduit.  There is a mild wall thickening of the right renal pelvis and ureter which may represent inflammation or infection. Right renal atrophy and bilateral cortical renal thinning noted.  A 4.7 x 5 cm infrarenal suprailiac abdominal aortic aneurysm with moderate thrombus is unchanged.  There are distended loops of small bowel with a transition point in the left lower pelvis - compatible with a high-grade small bowel obstruction. No definite cause identified.  There is no evidence of pneumoperitoneum or free fluid.  A large wide mouthed left anterior pelvic wall hernia is identified containing colon and which does not cause obstruction.  There is no evidence of biliary dilatation or enlarged lymph nodes. Radioactive seeds in the region of the prostate are noted.  The patient is status post cystectomy.  An ileal conduit is noted.  No acute or suspicious bony abnormalities are identified. Degenerative changes within the lumbar spine are noted.  A compression fracture of L1 is again identified.  IMPRESSION: High-grade small  bowel obstruction with transition point in the low lateral left pelvis without obstructing cause identified - question adhesion.  No evidence of pneumoperitoneum or free fluid.  Moderate bilateral hydronephrosis with right urinary stent as described.  Mild wall enhancement of the right renal pelvis and ureter may represent inflammation/infection.  Unchanged 4.7 x 5 cm infrarenal suprailiac abdominal aortic aneurysm.  Unchanged large left pelvic wall hernia containing colon.  Emphysema, bilateral lower lung scarring and coronary artery disease.   Original Report Authenticated By: Harmon Pier, M.D.     Scheduled Meds: . antiseptic oral rinse  15 mL Mouth Rinse BID  . heparin subcutaneous  5,000 Units Subcutaneous Q8H  . tiotropium  18 mcg Inhalation QHS   Continuous Infusions: . dextrose 5 % and 0.45% NaCl 75 mL/hr at 03/25/13 2254    Principal Problem:   SBO (small bowel obstruction) Active Problems:   Prostate cancer   Parastomal hernia at left colostomy   COPD (chronic obstructive pulmonary disease)   AAA (abdominal aortic aneurysm)   H/O colostomy   Rectourethral fistula   Ureteral stricture, right with chronic nephroureteral stenting    Hernia of pelvic floor   Time spent:  25  Pamella Pert, MD [facility] Pager [phone]. If 7 PM - 7 AM, please contact night-coverage at [email or web address], password [facility] 03/26/2013, 9:49 AM  LOS: 1 day

## 2013-03-26 NOTE — Progress Notes (Signed)
03/25/13-22:25 Michael Novak with TRH1 notified of brady rate 38-48, EKG done to show SB1stAV Block-rate 51. Pt asymptomatic other than con't c/o pain & nausea unrelieved by meds given at 20:00. New orders received & will follow thru & con't tele monitoring for changes.  03/25/13 2223  Vitals  Temp 98 F (36.7 C)  Temp src Oral  BP 130/65 mmHg  BP Location Right arm  BP Method Automatic  Patient Position, if appropriate Lying  Pulse Rate ! 50  Pulse Rate Source Dinamap  Resp 20  Oxygen Therapy  SpO2 97 %  O2 Device None (Room air)

## 2013-03-26 NOTE — Care Management Note (Addendum)
    Page 1 of 2   04/06/2013     4:14:54 PM   CARE MANAGEMENT NOTE 04/06/2013  Patient:  Michael Novak, Michael Novak   Account Number:  [id number]  Date Initiated:  03/26/2013  Documentation initiated by:  Lanier Clam  Subjective/Objective Assessment:   ADMITTED W/ABD PAIN.SBO.HX:PROSTATE CA,COLOSTOMY,NEPHROSTOMY.     Action/Plan:   FROM HOME W/SPOUSE.HAS RW,PHARMACY.   Anticipated DC Date:  04/09/2013   Anticipated DC Plan:  HOME W HOME HEALTH SERVICES      DC Planning Services  CM consult      Choice offered to / List presented to:             Status of service:  In process, will continue to follow Medicare Important Message given?   (If response is "NO", the following Medicare IM given date fields will be blank) Date Medicare IM given:   Date Additional Medicare IM given:    Discharge Disposition:    Per UR Regulation:  Reviewed for med. necessity/level of care/duration of stay  If discussed at Long Length of Stay Meetings, dates discussed:   04/03/2013  04/05/2013    Comments:  04/06/13 Jalal Rauch RN,BSN NCM [phone] NGT D/C.ADVANCING DIET.PROVIDED SON W/HHC AGENCY LIST IF D/C PLAN IS HOME W/HH.  04/04/13 Aracelys Glade RN,BSN NCM [phone] SPOKE TO SON PER PATIENT PERMISSION IN RM ABOUT [facility]AC-[facility] REP ALSO SPOKE TO SON ABOUT LTAC SERVICES.NGT STILL W/SIGNIFICANT OUTPUT.WOC-IRRIGATED OSTOMY.SX STILL FOLLOWING,CURRENTLY PATIENT STILL DECLINING FURTHER SX.IF MED STABLE & STILL APPROPRIATE FOR LTAC MAY D/C IN AM.MD UPDATED.  04/02/13 Amayia Ciano RN,BSN NCM [phone] NGT-,TNA & 90CC/HR.APPROPRIATE FOR LTAC.IF MD AGREE PLEASE PUT IN LTAC REFERRAL.  03/30/13 Nuvia Hileman RN,BSN NCM [phone] TNA@ 40.NGT.D/C PLAN HOME.  03/29/13 Alwaleed Obeso RN,BSN NCM [phone] SBO-IMPROVING,NGT,ICE CHIPS,PICC,?TNA.PT CONS. 03/26/13 Shantay Sonn RN,BSN NCM [phone] NGT.

## 2013-03-26 NOTE — Consult Note (Signed)
Admit date: 03/25/2013 Referring Physician  Elvera Lennox, MD Primary Physician  Murray Hodgkins, MD  Primary Cardiologist  HWBSmith, MD Reason for Consultation  Bradycardia  ASSESSMENT: 1. 2 second sinus pause by report in Dr Charlean Sanfilippo note. Unable to substantiate by looking at telemetry. He does have h/o asymptomatic bradycardia. Suspect Sinus node dysfunction. There is also currently an overarching vasovagal component given the current GI situation. 2. First degree AV block. 3. SBO with associated nausea, abdominal pain, and NG tube  PLAN: 1. No evaluation required unless pauses > 3.0 seconds or symptomatic bradycardia.   HPI: No cardiac symptoms. Nausea, abdominal discomfort and NG tube persist.   PMH:   Past Medical History  Diagnosis Date  . History of colostomy   . Urostomy stenosis     Ask patient to clarify, per history form dated 04/02/11.  . Prostate cancer   . Skin cancer   . Neuropathy     in feet   . COPD (chronic obstructive pulmonary disease)   . Depression   . UTI (lower urinary tract infection)     history   . AAA (abdominal aortic aneurysm)   . 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 Dyslipidemia 12/14/2011  . Bradycardia, HR 30-40s, Nov-Dec2012 11/26/2011  . 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 Cystectomy, ileal coduit, colostomy  2002    for severe rectourethral fistula  . Ex lap, lysis of adhesions  2010    for SBO  . Lap repair of parastomal hernias  2009    Dr. Barnetta [facility],  Hematoma evac POD#1  . Eye surgery      cataracts removed    Allergies:  Augmentin Prior to Admit Meds:   Prescriptions prior to admission  Medication Sig Dispense Refill  . acetaminophen (TYLENOL) 500 MG tablet Take 1,000 mg by mouth every 6 (six) hours as needed. For pain      . albuterol (PROVENTIL HFA;VENTOLIN HFA) 108 (90 BASE) MCG/ACT inhaler Inhale 2 puffs into the  lungs every 4 (four) hours as needed for shortness of breath.       Marland Kitchen aspirin EC 81 MG tablet Take 81 mg by mouth at bedtime.       Marland Kitchen buPROPion (WELLBUTRIN SR) 150 MG 12 hr tablet Take 150 mg by mouth 2 (two) times daily.       . Calcium-Magnesium-Vitamin D (CALCIUM MAGNESIUM PO) Take 1 tablet by mouth every morning.       . ciprofloxacin (CIPRO) 500 MG tablet Take 500 mg by mouth 2 (two) times daily as needed. Taken if patient has kidney infection. Alternating with Hiprex.      . gabapentin (NEURONTIN) 300 MG capsule Take 300 mg by mouth 3 (three) times daily.       Marland Kitchen glucosamine-chondroitin 500-400 MG tablet Take 1 tablet by mouth 2 (two) times daily.       . hydrophilic ointment Apply 1 application topically daily as needed. Applies to feet.      Marland Kitchen ketoconazole (NIZORAL) 2 % shampoo Apply 1 application topically 2 (two) times a week.       . methenamine (HIPREX) 1 G tablet Take 1 g by mouth daily as needed. Taken if patient has kidney infection. Alternating with Cipro.      . Multiple Vitamin (MULTIVITAMIN WITH MINERALS) TABS Take 1 tablet by mouth every morning.       Marland Kitchen  nitrofurantoin (MACRODANTIN) 50 MG capsule Take 50 mg by mouth at bedtime.      . selenium sulfide (SELSUN) 2.5 % shampoo Apply 1 application topically daily as needed (Applies to scalp for dandruff or itching.).       Marland Kitchen simvastatin (ZOCOR) 20 MG tablet Take 20 mg by mouth at bedtime.        . sodium chloride (OCEAN) 0.65 % nasal spray Place 1 spray into the nose daily as needed. For nasal decongestant      . sorbitol 70 % solution Take 15-30 mLs by mouth daily as needed. For constipation      . tiotropium (SPIRIVA) 18 MCG inhalation capsule Place 18 mcg into inhaler and inhale at bedtime.      . vitamin C (ASCORBIC ACID) 500 MG tablet Take 500 mg by mouth daily.       Fam HX:   History reviewed. No pertinent family history. Social HX:    History   Social History  . Marital Status: Married    Spouse Name: N/A    Number  of Children: N/A  . Years of Education: N/A   Occupational History  . Not on file.   Social History Main Topics  . Smoking status: Former Smoker -- 37 years    Types: Cigarettes  . Smokeless tobacco: Never Used  . Alcohol Use: 0.6 oz/week    1 Glasses of wine per week     Comment: wine occasionally with supper   . Drug Use: No  . Sexually Active: No   Other Topics Concern  . Not on file   Social History Narrative  . No narrative on file     Review of Systems: Unhelpful  Physical Exam: Blood pressure 109/68, pulse 61, temperature 98.8 F (37.1 C), temperature source Oral, resp. rate 16, height 6\' 2"  (1.88 m), weight 88.724 kg (195 lb 9.6 oz), SpO2 89.00%. Weight change:   Distant heart sounds. Labs:   Lab Results  Component Value Date   WBC 5.3 03/25/2013   HGB 15.0 03/25/2013   HCT 42.8 03/25/2013   MCV 89.0 03/25/2013   PLT 164 03/25/2013    Recent Labs Lab 03/25/13 1002  NA 134*  K 4.5  CL 100  CO2 25  BUN 24*  CREATININE 1.33  CALCIUM 9.7  GLUCOSE 118*   No results found for this basename: PTT   Lab Results  Component Value Date   INR 1.10 01/22/2013   INR 1.07 07/26/2012   INR 1.15 12/16/2011   No results found for this basename: CKTOTAL, CKMB, CKMBINDEX, TROPONINI     Lab Results  Component Value Date   CHOL  Value: 101        ATP III CLASSIFICATION:  <200     mg/dL   Desirable  409-811  mg/dL   Borderline High  >=914    mg/dL   High        7/82/9562   CHOL  Value: 100        ATP III CLASSIFICATION:  <200     mg/dL   Desirable  130-865  mg/dL   Borderline High  >=784    mg/dL   High        6/96/2952   CHOL  Value: 118        ATP III CLASSIFICATION:  <200     mg/dL   Desirable  841-324  mg/dL   Borderline High  >=401    mg/dL   High  12/05/2009   Lab Results  Component Value Date   HDL 45 12/05/2009   Lab Results  Component Value Date   LDLCALC  Value: 53        Total Cholesterol/HDL:CHD Risk Coronary Heart Disease Risk Table                      Men   Women  1/2 Average Risk   3.4   3.3  Average Risk       5.0   4.4  2 X Average Risk   9.6   7.1  3 X Average Risk  23.4   11.0        Use the calculated Patient Ratio above and the CHD Risk Table to determine the patient's CHD Risk.        ATP III CLASSIFICATION (LDL):  <100     mg/dL   Optimal  161-096  mg/dL   Near or Above                    Optimal  130-159  mg/dL   Borderline  045-409  mg/dL   High  >811     mg/dL   Very High 07/30/4781   Lab Results  Component Value Date   TRIG 109 12/15/2009   TRIG 100 12/13/2009   TRIG 102 12/05/2009   Lab Results  Component Value Date   CHOLHDL 2.6 12/05/2009   No results found for this basename: LDLDIRECT      Radiology:  Ct Abdomen Pelvis W Contrast  03/25/2013  *RADIOLOGY REPORT*  Clinical Data: 79 year old male with left flank, abdominal and pelvic pain with nausea. History of prostate cancer, cystectomy and urinary diversion/ileal conduit.  Recent urinary stent placement.  CT ABDOMEN AND PELVIS WITH CONTRAST  Technique:  Multidetector CT imaging of the abdomen and pelvis was performed following the standard protocol during bolus administration of intravenous contrast.  Contrast: 100 ml intravenous Omnipaque-300  Comparison: 11/08/2012 CT and02/23/2014 renal ultrasound.  Findings: Bibasilar scarring, emphysema and heavy coronary artery calcifications are identified.  The liver, spleen, pancreas and adrenal glands are unremarkable. Cholelithiasis identified without evidence of acute cholecystitis.  Moderate bilateral hydronephrosis is identified, slightly decreased on the right since 01/21/2013.  A right urinary stent is identified with proximal tip in the lower renal pelvis/proximal ureter and extending out the ileal conduit.  There is a mild wall thickening of the right renal pelvis and ureter which may represent inflammation or infection. Right renal atrophy and bilateral cortical renal thinning noted.  A 4.7 x 5 cm infrarenal suprailiac abdominal  aortic aneurysm with moderate thrombus is unchanged.  There are distended loops of small bowel with a transition point in the left lower pelvis - compatible with a high-grade small bowel obstruction. No definite cause identified.  There is no evidence of pneumoperitoneum or free fluid.  A large wide mouthed left anterior pelvic wall hernia is identified containing colon and which does not cause obstruction.  There is no evidence of biliary dilatation or enlarged lymph nodes. Radioactive seeds in the region of the prostate are noted.  The patient is status post cystectomy.  An ileal conduit is noted.  No acute or suspicious bony abnormalities are identified. Degenerative changes within the lumbar spine are noted.  A compression fracture of L1 is again identified.  IMPRESSION: High-grade small bowel obstruction with transition point in the low lateral left pelvis without obstructing cause identified - question adhesion.  No evidence  of pneumoperitoneum or free fluid.  Moderate bilateral hydronephrosis with right urinary stent as described.  Mild wall enhancement of the right renal pelvis and ureter may represent inflammation/infection.  Unchanged 4.7 x 5 cm infrarenal suprailiac abdominal aortic aneurysm.  Unchanged large left pelvic wall hernia containing colon.  Emphysema, bilateral lower lung scarring and coronary artery disease.   Original Report Authenticated By: Harmon Pier, M.D.    EKG:  First degree AV block with LAD    Lesleigh Noe 03/26/2013 10:50 AM

## 2013-03-26 NOTE — Progress Notes (Signed)
ATTENDING ADDENDUM:  I personally reviewed patient's record, examined the patient, and formulated the following assessment and plan:  Feels better.  NG functioning well.  Abd soft.  Hopefully this will resolve with NG decompression.

## 2013-03-27 ENCOUNTER — Ambulatory Visit (HOSPITAL_COMMUNITY): Payer: Medicare Other

## 2013-03-27 DIAGNOSIS — E860 Dehydration: Secondary | ICD-10-CM

## 2013-03-27 LAB — CBC
Hemoglobin: 14.6 g/dL (ref 13.0–17.0)
RBC: 4.72 MIL/uL (ref 4.22–5.81)

## 2013-03-27 LAB — BASIC METABOLIC PANEL
GFR calc Af Amer: 48 mL/min — ABNORMAL LOW (ref >90)
GFR calc non Af Amer: 42 mL/min — ABNORMAL LOW (ref >90)
Potassium: 4 mEq/L (ref 3.5–5.1)
Sodium: 136 mEq/L (ref 135–145)

## 2013-03-27 MED ORDER — SODIUM CHLORIDE 0.9 % IV BOLUS (SEPSIS)
1000.0000 mL | Freq: Once | INTRAVENOUS | Status: AC
  Administered 2013-03-27: 1000 mL via INTRAVENOUS

## 2013-03-27 MED ORDER — HYDROMORPHONE HCL PF 1 MG/ML IJ SOLN
1.0000 mg | INTRAMUSCULAR | Status: DC | PRN
  Administered 2013-03-27 – 2013-04-06 (×23): 1 mg via INTRAVENOUS
  Filled 2013-03-27 (×25): qty 1

## 2013-03-27 MED ORDER — ONDANSETRON HCL 4 MG/2ML IJ SOLN
INTRAMUSCULAR | Status: AC
  Filled 2013-03-27: qty 2

## 2013-03-27 MED ORDER — ONDANSETRON HCL 4 MG/2ML IJ SOLN
4.0000 mg | Freq: Four times a day (QID) | INTRAMUSCULAR | Status: DC | PRN
  Administered 2013-03-27 – 2013-04-01 (×4): 4 mg via INTRAVENOUS
  Filled 2013-03-27 (×3): qty 2

## 2013-03-27 NOTE — Progress Notes (Addendum)
[facility] PROGRESS NOTE  Michael Novak:[id number] DOB: 1930/08/16 DOA: 03/25/2013 PCP: Kimber Relic, MD  HPI: 79 y.o. male has a past medical history significant for prostate cancer (urologist is Dr. Logan Bores) s/p radioactive seed implantation, complicated by prostatic rectal fistula status post cystoprostatectomy with ileal conduit as well as proctocolectomy with colostomy, as well as right-sided nephrostomy placed in July of 2013 (exchanged every 6 weeks), abdominal aortic aneurysm followed by vascular surgery as an outpatient with a stable appearance on repeat imaging, COPD, presents with a chief complaint of left flank and left lower quadrant abdominal pain that started yesterday. He denies any fever or chills. He endorses good colostomy output up until last night. He endorses nausea without vomiting. He has no chest pain, no breathing difficulties, he denies lightheadedness or dizziness. In the emergency room a CT scan of the abdomen was done which showed a high-grade small bowel obstruction in the lower lateral left pelvis without obvious obstructing cause. Surgery has been consulted, and due to multiple medical problems hospitalist service was asked for admission. Patient currently is in no acute distress, has an NG tube in place feels much more comfortable than when he came in. He denies any complaints and states that his abdominal pain is better in the ED.   Assessment/Plan:  Small bowel obstruction  - Surgery has been consulted by the ED physician, appreciate their input.  - Conservative management for now, patient to remain n.p.o., NG tube in place  - D5 half-normal as maintenance fluids  - phenergan for nausea, pain control - slightly worse renal function, will increase fluids rate from 75 cc/h to 125 cc/h to match gi losses.  AAA  - stable per imaging   Bradycardia/1st degree heart block - 2 sec pause on monitor overnight 4/28, asymptomatic, in sinus rhythm - cardiology  saw him 4/28, no interventions.   Ureteral stricture  - nephrostomy in place, renal function close to baseline (~1.2)  - monitor UOP   COPD  - Inhalers when necessary   Hyperlipidemia  - hold oral meds while strict NPO.   Cholelithiasis - asymptomatic  Hydronephrosis - s/p stenting in 2013, good UOP  DVT prophylaxis  - heparin s.q. Since no planned surgery   Code Status: DNR Family Communication: none  Disposition Plan: remain inpatient  Consultants:  Surgery  Cardiolgogy  Procedures:  none  Antibiotics:  Anti-infectives   None     Antibiotics Given (last 72 hours)   None      Past antibiotics none  HPI/Subjective: - continues to feel nausea, with abdominal pain, both controlled with medications. Not passing gas and colostomy bag empty.  Objective: Filed Vitals:   03/26/13 1500 03/26/13 2127 03/26/13 2148 03/27/13 0606  BP: 124/58  117/69 151/91  Pulse: 65  69 81  Temp: 99.5 F (37.5 C)  98.7 F (37.1 C) 99.6 F (37.6 C)  TempSrc: Oral  Oral Oral  Resp: 18  18 18   Height:      Weight:      SpO2: 95% 91% 92% 94%    Intake/Output Summary (Last 24 hours) at 03/27/13 0908 Last data filed at 03/27/13 0700  Gross per 24 hour  Intake   1800 ml  Output   1650 ml  Net    150 ml   Filed Weights   03/25/13 1556  Weight: 88.724 kg (195 lb 9.6 oz)    Exam:   General:  NAD  Cardiovascular: regular rate and rhythm, bradycardic  Respiratory: good air movement, clear to auscultation throughout, no wheezing, ronchi or rales  Abdomen: soft, mildly tender to palpation; colostomy urostomy in place.   MSK: no peripheral edema  Neuro: CN 2-12 grossly intact, MS 5/5 in all 4  Data Reviewed: Basic Metabolic Panel:  Recent Labs Lab 03/25/13 1002 03/26/13 1100 03/27/13 0435  NA 134* 136 136  K 4.5 4.5 4.0  CL 100 100 96  CO2 25 29 33*  GLUCOSE 118* 129* 124*  BUN 24* 22 22  CREATININE 1.33 1.41* 1.52*  CALCIUM 9.7 9.1 9.2    CBC:  Recent Labs Lab 03/25/13 1002 03/27/13 0435  WBC 5.3 4.3  NEUTROABS 3.7  --   HGB 15.0 14.6  HCT 42.8 42.7  MCV 89.0 90.5  PLT 164 167   Studies: Ct Abdomen Pelvis W Contrast  03/25/2013  *RADIOLOGY REPORT*  Clinical Data: 79 year old male with left flank, abdominal and pelvic pain with nausea. History of prostate cancer, cystectomy and urinary diversion/ileal conduit.  Recent urinary stent placement.  CT ABDOMEN AND PELVIS WITH CONTRAST  Technique:  Multidetector CT imaging of the abdomen and pelvis was performed following the standard protocol during bolus administration of intravenous contrast.  Contrast: 100 ml intravenous Omnipaque-300  Comparison: 11/08/2012 CT and02/23/2014 renal ultrasound.  Findings: Bibasilar scarring, emphysema and heavy coronary artery calcifications are identified.  The liver, spleen, pancreas and adrenal glands are unremarkable. Cholelithiasis identified without evidence of acute cholecystitis.  Moderate bilateral hydronephrosis is identified, slightly decreased on the right since 01/21/2013.  A right urinary stent is identified with proximal tip in the lower renal pelvis/proximal ureter and extending out the ileal conduit.  There is a mild wall thickening of the right renal pelvis and ureter which may represent inflammation or infection. Right renal atrophy and bilateral cortical renal thinning noted.  A 4.7 x 5 cm infrarenal suprailiac abdominal aortic aneurysm with moderate thrombus is unchanged.  There are distended loops of small bowel with a transition point in the left lower pelvis - compatible with a high-grade small bowel obstruction. No definite cause identified.  There is no evidence of pneumoperitoneum or free fluid.  A large wide mouthed left anterior pelvic wall hernia is identified containing colon and which does not cause obstruction.  There is no evidence of biliary dilatation or enlarged lymph nodes. Radioactive seeds in the region of the  prostate are noted.  The patient is status post cystectomy.  An ileal conduit is noted.  No acute or suspicious bony abnormalities are identified. Degenerative changes within the lumbar spine are noted.  A compression fracture of L1 is again identified.  IMPRESSION: High-grade small bowel obstruction with transition point in the low lateral left pelvis without obstructing cause identified - question adhesion.  No evidence of pneumoperitoneum or free fluid.  Moderate bilateral hydronephrosis with right urinary stent as described.  Mild wall enhancement of the right renal pelvis and ureter may represent inflammation/infection.  Unchanged 4.7 x 5 cm infrarenal suprailiac abdominal aortic aneurysm.  Unchanged large left pelvic wall hernia containing colon.  Emphysema, bilateral lower lung scarring and coronary artery disease.   Original Report Authenticated By: Harmon Pier, M.D.    Dg Abd Portable 2v  03/26/2013  *RADIOLOGY REPORT*  Clinical Data: Abdominal pain.  PORTABLE ABDOMEN - 2 VIEW  Comparison: CT 03/25/2013  Findings: NG tube is present in the stomach.  Small bowel remains mildly prominent and dilated, similar to prior CT.  The right Nephro ureteral catheter remains in stable position.  No free air organomegaly.  IMPRESSION: Continued mid abdominal small bowel dilatation compatible with small bowel obstruction.  No real change since prior CT.  NG tube in the stomach.   Original Report Authenticated By: Charlett Nose, M.D.     Scheduled Meds: . antiseptic oral rinse  15 mL Mouth Rinse BID  . heparin subcutaneous  5,000 Units Subcutaneous Q8H  . tiotropium  18 mcg Inhalation QHS   Continuous Infusions: . dextrose 5 % and 0.45% NaCl 75 mL/hr at 03/27/13 0016    Principal Problem:   SBO (small bowel obstruction) Active Problems:   Prostate cancer   Parastomal hernia at left colostomy   COPD (chronic obstructive pulmonary disease)   AAA (abdominal aortic aneurysm)   H/O colostomy   Rectourethral  fistula   Ureteral stricture, right with chronic nephroureteral stenting    Hernia of pelvic floor   Time spent: 25  Pamella Pert, MD [facility] Pager [phone]. If 7 PM - 7 AM, please contact night-coverage at [email or web address], password [facility] 03/27/2013, 9:08 AM  LOS: 2 days

## 2013-03-27 NOTE — Clinical Documentation Improvement (Signed)
GENERIC DOCUMENTATION CLARIFICATION QUERY  THIS DOCUMENT IS NOT A PERMANENT PART OF THE MEDICAL RECORD  TO RESPOND TO THE THIS QUERY, FOLLOW THE INSTRUCTIONS BELOW:  1. If needed, update documentation for the patient's encounter via the notes activity.  2. Access this query again and click edit on the In [employer].  3. After updating, or not, click F2 to complete all highlighted (required) fields concerning your review. Select "additional documentation in the medical record" OR "no additional documentation provided".  4. Click Sign note button.  5. The deficiency will fall out of your In Basket *Please let us know if you are not able to complete this workflow by phone or e-mail (listed below).  Please update your documentation within the medical record to reflect your response to this query.                                                                                        03/27/13   Dear Dr. Elvera Lennox, [facility],  In a better effort to capture your patient's severity of illness, reflect appropriate length of stay and utilization of resources, a review of the patient medical record has revealed the following indicators.    Based on your clinical judgment, please clarify and document in a progress note and/or discharge summary the clinical condition associated with the following supporting information:  In responding to this query please exercise your independent judgment.  The fact that a query is asked, does not imply that any particular answer is desired or expected.  Pt with hydronephrosis per ED note 03/25/13 and Cholelithiasis identified per CT abdomen Pelvis w/ contrast on 03/25/13.  Clarification Needed   Please clarify if you agree pt with "hydronephrosis" and "Cholelithiasis" or other diagnosis and document in pn or d/c summary     Possible Clinical Conditions?  ____________________  ____________________ ____________________ _______Other  Condition__________________ _______Cannot Clinically Determine   Supporting Information: SBO, COPD, hernia, AAA, HLD, 1st degree AV block, hydronephrosis, Cholelithiasis   Risk Factors:  Signs & Symptoms:  Diagnostics: CT abdomen Pelvis w/ contrast on 03/25/13.  Cholelithiasis identified without evidence of acute cholecystitis. Moderate bilateral hydronephrosis is identified, slightly decreased on the right    Moderate bilateral hydronephrosis with right urinary stent as described.Mild wall enhancement of the right renal pelvis and ureter may represent inflammation/infection   Treatment Monitoring Morphine Dilaudid  You may use possible, probable, or suspect with inpatient documentation. possible, probable, suspected diagnoses MUST be documented at the time of discharge  Reviewed: additional documentation in the medical record  Thank You,  Enis Slipper  RN, BSN, MSN/Inf, CCDS Clinical Documentation Specialist [facility] Dept Pager: [phone] / E-mail: [email or web address]  Health Information Management [place]

## 2013-03-27 NOTE — Evaluation (Addendum)
Physical Therapy Evaluation Patient Details Name: Michael Novak MRN: [id number] DOB: 08/22/1930 Today's Date: 03/27/2013 Time: 4098-1191 PT Time Calculation (min): 17 min  PT Assessment / Plan / Recommendation Clinical Impression  Pt is an 79 year old male admitted with SBO and currently with NG tube.  Pt would benefit from acute PT services to maximize independence to prepare for d/c home with spouse.      PT Assessment  Patient needs continued PT services    Follow Up Recommendations  No PT follow up    Does the patient have the potential to tolerate intense rehabilitation      Barriers to Discharge        Equipment Recommendations  None recommended by PT    Recommendations for Other Services     Frequency Min 3X/week    Precautions / Restrictions Precautions Precautions: Fall Precaution Comments: NG tube   Pertinent Vitals/Pain RN notified of pt request for pain meds.     Mobility  Bed Mobility Bed Mobility: Not assessed Details for Bed Mobility Assistance: pt up in recliner upon arrival Transfers Transfers: Sit to Stand;Stand to Sit Sit to Stand: 4: Min guard;With upper extremity assist;From chair/3-in-1 Stand to Sit: 4: Min guard;With upper extremity assist;To chair/3-in-1 Details for Transfer Assistance: verbal cues for hand placement Ambulation/Gait Ambulation/Gait Assistance: 4: Min guard Ambulation Distance (Feet): 320 Feet Assistive device: Rolling walker Ambulation/Gait Assistance Details: steady with RW, verbal cues for RW distance, pt reports pain in abdomen with ambulation Gait Pattern: Step-through pattern;Decreased stride length;Trunk flexed    Exercises     PT Diagnosis: Difficulty walking  PT Problem List: Decreased activity tolerance;Decreased mobility;Pain;Decreased knowledge of use of DME PT Treatment Interventions: DME instruction;Gait training;Functional mobility training;Therapeutic activities;Therapeutic exercise;Patient/family  education   PT Goals Acute Rehab PT Goals PT Goal Formulation: With patient Time For Goal Achievement: 04/03/13 Potential to Achieve Goals: Good Pt will go Sit to Stand: with modified independence PT Goal: Sit to Stand - Progress: Goal set today Pt will go Stand to Sit: with modified independence PT Goal: Stand to Sit - Progress: Goal set today Pt will Ambulate: >150 feet;with modified independence;with least restrictive assistive device PT Goal: Ambulate - Progress: Goal set today Pt will Perform Home Exercise Program: with supervision, verbal cues required/provided PT Goal: Perform Home Exercise Program - Progress: Goal set today  Visit Information  Last PT Received On: 03/27/13 Assistance Needed: +1    Subjective Data  Subjective: I don't feel so great but I know I should be walking.   Prior Functioning  Home Living Lives With: Spouse Type of Home: House Home Access: Level entry Home Layout: One level Home Adaptive Equipment: Environmental consultant - four wheeled Prior Function Level of Independence: Independent Communication Communication: No difficulties    Cognition  Cognition Arousal/Alertness: Awake/alert Behavior During Therapy: WFL for tasks assessed/performed Overall Cognitive Status: Within Functional Limits for tasks assessed    Extremity/Trunk Assessment Right Upper Extremity Assessment RUE ROM/Strength/Tone: [facility] for tasks assessed Left Upper Extremity Assessment LUE ROM/Strength/Tone: WFL for tasks assessed Right Lower Extremity Assessment RLE ROM/Strength/Tone: [facility] for tasks assessed Left Lower Extremity Assessment LLE ROM/Strength/Tone: WFL for tasks assessed   Balance    End of Session PT - End of Session Activity Tolerance: Patient tolerated treatment well Patient left: in chair;with call bell/phone within reach Nurse Communication: Patient requests pain meds  GP     Cherith Tewell,KATHrine E 03/27/2013, 4:28 PM Zenovia Jarred, PT, DPT 03/27/2013 Pager:  [phone]

## 2013-03-27 NOTE — Progress Notes (Signed)
ATTENDING ADDENDUM:  I personally reviewed patient's record, examined the patient, and formulated the following assessment and plan:  Still with high NG outputs.  Will continue with conservative management.  UOP low, will bolus and add replacement IV fluids

## 2013-03-27 NOTE — Progress Notes (Signed)
Subjective: NO real improvement.  Still complains of abd pain and nausea.  Nothing in ostomy bag. Has not really been out of bed either.  Nothing for nutrition since Saturday72 hours. NG cannister almost full, dumped another 150 while I was in room and turned suction up. Objective: Vital signs in last 24 hours: Temp:  [98.7 F (37.1 C)-99.6 F (37.6 C)] 99.6 F (37.6 C) (04/29 0606) Pulse Rate:  [65-81] 81 (04/29 0606) Resp:  [18] 18 (04/29 0606) BP: (117-151)/(58-91) 151/91 mmHg (04/29 0606) SpO2:  [91 %-95 %] 94 % (04/29 0606) Last BM Date: 03/26/13 1000 ml from NG yesterday, Diet: npo Afebrile, VSS, creatinine is up some. Film yesterday showed ongoing SBO  Intake/Output from previous day: 04/28 0701 - 04/29 0700 In: 1800 [I.V.:1800] Out: 1650 [Urine:650; Emesis/NG output:1000] Intake/Output this shift:    General appearance: alert, cooperative and no distress Resp: clear to auscultation bilaterally GI: soft, a few Bs, minimal distension. nothing in ostomy bag.  Lab Results:   Recent Labs  03/25/13 1002 03/27/13 0435  WBC 5.3 4.3  HGB 15.0 14.6  HCT 42.8 42.7  PLT 164 167    BMET  Recent Labs  03/26/13 1100 03/27/13 0435  NA 136 136  K 4.5 4.0  CL 100 96  CO2 29 33*  GLUCOSE 129* 124*  BUN 22 22  CREATININE 1.41* 1.52*  CALCIUM 9.1 9.2   PT/INR No results found for this basename: LABPROT, INR,  in the last 72 hours  No results found for this basename: AST, ALT, ALKPHOS, BILITOT, PROT, ALBUMIN,  in the last 168 hours   Lipase     Component Value Date/Time   LIPASE 23 06/21/2012 1630     Studies/Results: Ct Abdomen Pelvis W Contrast  03/25/2013  *RADIOLOGY REPORT*  Clinical Data: 79 year old male with left flank, abdominal and pelvic pain with nausea. History of prostate cancer, cystectomy and urinary diversion/ileal conduit.  Recent urinary stent placement.  CT ABDOMEN AND PELVIS WITH CONTRAST  Technique:  Multidetector CT imaging of the  abdomen and pelvis was performed following the standard protocol during bolus administration of intravenous contrast.  Contrast: 100 ml intravenous Omnipaque-300  Comparison: 11/08/2012 CT and02/23/2014 renal ultrasound.  Findings: Bibasilar scarring, emphysema and heavy coronary artery calcifications are identified.  The liver, spleen, pancreas and adrenal glands are unremarkable. Cholelithiasis identified without evidence of acute cholecystitis.  Moderate bilateral hydronephrosis is identified, slightly decreased on the right since 01/21/2013.  A right urinary stent is identified with proximal tip in the lower renal pelvis/proximal ureter and extending out the ileal conduit.  There is a mild wall thickening of the right renal pelvis and ureter which may represent inflammation or infection. Right renal atrophy and bilateral cortical renal thinning noted.  A 4.7 x 5 cm infrarenal suprailiac abdominal aortic aneurysm with moderate thrombus is unchanged.  There are distended loops of small bowel with a transition point in the left lower pelvis - compatible with a high-grade small bowel obstruction. No definite cause identified.  There is no evidence of pneumoperitoneum or free fluid.  A large wide mouthed left anterior pelvic wall hernia is identified containing colon and which does not cause obstruction.  There is no evidence of biliary dilatation or enlarged lymph nodes. Radioactive seeds in the region of the prostate are noted.  The patient is status post cystectomy.  An ileal conduit is noted.  No acute or suspicious bony abnormalities are identified. Degenerative changes within the lumbar spine are noted.  A  compression fracture of L1 is again identified.  IMPRESSION: High-grade small bowel obstruction with transition point in the low lateral left pelvis without obstructing cause identified - question adhesion.  No evidence of pneumoperitoneum or free fluid.  Moderate bilateral hydronephrosis with right urinary  stent as described.  Mild wall enhancement of the right renal pelvis and ureter may represent inflammation/infection.  Unchanged 4.7 x 5 cm infrarenal suprailiac abdominal aortic aneurysm.  Unchanged large left pelvic wall hernia containing colon.  Emphysema, bilateral lower lung scarring and coronary artery disease.   Original Report Authenticated By: Harmon Pier, M.D.    Dg Abd Portable 2v  03/26/2013  *RADIOLOGY REPORT*  Clinical Data: Abdominal pain.  PORTABLE ABDOMEN - 2 VIEW  Comparison: CT 03/25/2013  Findings: NG tube is present in the stomach.  Small bowel remains mildly prominent and dilated, similar to prior CT.  The right Nephro ureteral catheter remains in stable position.  No free air organomegaly.  IMPRESSION: Continued mid abdominal small bowel dilatation compatible with small bowel obstruction.  No real change since prior CT.  NG tube in the stomach.   Original Report Authenticated By: Charlett Nose, M.D.     Medications: . antiseptic oral rinse  15 mL Mouth Rinse BID  . heparin subcutaneous  5,000 Units Subcutaneous Q8H  . tiotropium  18 mcg Inhalation QHS    Assessment/Plan SBO, prior Exploratory lap for SBO and lysis of adhesions. 2010  Hx of Recurrent Prostate CA with radiation seeds  Rectourethral fistula with cystectomy, ileal conduit and permanent end colostomy, 2002  Parastomal colostomy hernia  Repair of parastomal Hernia 2009  Chronic right ureteral stricture with stnets by IR  COPD  Depression  AAA   Plan:  Continue NG, as PT and nursing to help mobilize him.   He may need more fluid with rising creatinine, but will defer to medicine.  If he doesn't turn around soon may consider TNA.  LOS: 2 days    Michael Novak 77/29/2014

## 2013-03-27 NOTE — Progress Notes (Signed)
Pt had a total of of output from NG tube. Per MD order pt to receive replacement of 0.72ml output : 1ml NS IV. So pt is to receive of NS replacement fluids. NS hung and programed to infuse. Night RN asked to give pt the remaining of NS and she stated she would.   Arta Bruce [facility] 03/27/2013 8:02 PM

## 2013-03-28 ENCOUNTER — Inpatient Hospital Stay (HOSPITAL_COMMUNITY): Payer: Medicare Other

## 2013-03-28 DIAGNOSIS — C61 Malignant neoplasm of prostate: Secondary | ICD-10-CM

## 2013-03-28 DIAGNOSIS — K56609 Unspecified intestinal obstruction, unspecified as to partial versus complete obstruction: Secondary | ICD-10-CM

## 2013-03-28 DIAGNOSIS — N360 Urethral fistula: Secondary | ICD-10-CM

## 2013-03-28 DIAGNOSIS — N135 Crossing vessel and stricture of ureter without hydronephrosis: Secondary | ICD-10-CM

## 2013-03-28 LAB — BASIC METABOLIC PANEL
CO2: 29 mEq/L (ref 19–32)
GFR calc non Af Amer: 47 mL/min — ABNORMAL LOW (ref >90)
Glucose, Bld: 116 mg/dL — ABNORMAL HIGH (ref 70–99)
Potassium: 3.6 mEq/L (ref 3.5–5.1)
Sodium: 139 mEq/L (ref 135–145)

## 2013-03-28 LAB — CBC
Hemoglobin: 12.7 g/dL — ABNORMAL LOW (ref 13.0–17.0)
MCH: 30 pg (ref 26.0–34.0)
MCV: 90.8 fL (ref 78.0–100.0)
RBC: 4.23 MIL/uL (ref 4.22–5.81)
WBC: 4.2 10*3/uL (ref 4.0–10.5)

## 2013-03-28 MED ORDER — LORATADINE 10 MG PO TABS
10.0000 mg | ORAL_TABLET | Freq: Every day | ORAL | Status: DC | PRN
Start: 2013-03-28 — End: 2013-04-09
  Administered 2013-03-28 – 2013-04-05 (×2): 10 mg via ORAL
  Filled 2013-03-28 (×2): qty 1

## 2013-03-28 MED ORDER — PROMETHAZINE HCL 25 MG/ML IJ SOLN
6.2500 mg | Freq: Once | INTRAMUSCULAR | Status: AC
  Administered 2013-03-28: 6.25 mg via INTRAVENOUS

## 2013-03-28 MED ORDER — SODIUM CHLORIDE 0.9 % IV SOLN
Freq: Two times a day (BID) | INTRAVENOUS | Status: DC
  Administered 2013-03-28: 19:00:00 via INTRAVENOUS
  Administered 2013-03-28: 750 mL via INTRAVENOUS
  Administered 2013-03-29: 08:00:00 via INTRAVENOUS
  Administered 2013-03-29: 420 mL via INTRAVENOUS

## 2013-03-28 NOTE — Progress Notes (Signed)
PT Cancellation Note  Patient Details Name: Michael Novak MRN: [id number] DOB: 01/14/1930   Cancelled Treatment:    Reason Eval/Treat Not Completed: Fatigue/lethargy limiting ability to participate  Pt reports feeling very nauseated this morning and not up to therapy at this time however states, "maybe later."  Will check back as schedule permits.   Uzma Hellmer,KATHrine E 03/28/2013, 3:13 PM

## 2013-03-28 NOTE — Progress Notes (Signed)
  Subjective: He is nauseated and trying to vomit into a pan.  He had phenergan 2 hours ago and dilaudid.  NG wasn't working filter on sump was wet, we fixed that.  Objective: Vital signs in last 24 hours: Temp:  [98.2 F (36.8 C)-99.5 F (37.5 C)] 98.2 F (36.8 C) (04/30 4098) Pulse Rate:  [60-94] 60 (04/30 0652) Resp:  [19-20] 20 (04/30 0652) BP: (119-132)/(64-82) 132/64 mmHg (04/30 0652) SpO2:  [92 %-96 %] 96 % (04/30 0652) Last BM Date: 03/26/13 1275 ml from NG yesterday, no stool recorded. NPO, Tm 99.6 Creatinine is better, WBC stable.  Intake/Output from previous day: 04/29 0701 - 04/30 0700 In: 3990.8 [I.V.:2924.2; IV Piggyback:1066.7] Out: 1575 [Urine:300; Emesis/NG output:1275] Intake/Output this shift: Total I/O In: -  Out: 150 [Emesis/NG output:150]  General appearance: alert and nauseated and having pain. Resp: clear to auscultation bilaterally GI: distended,, nauseated, nothing from the ostomy, and he's more tender today.  Lab Results:   Recent Labs  03/27/13 0435 03/28/13 0443  WBC 4.3 4.2  HGB 14.6 12.7*  HCT 42.7 38.4*  PLT 167 147*    BMET  Recent Labs  03/27/13 0435 03/28/13 0443  NA 136 139  K 4.0 3.6  CL 96 102  CO2 33* 29  GLUCOSE 124* 116*  BUN 22 24*  CREATININE 1.52* 1.37*  CALCIUM 9.2 8.5   PT/INR No results found for this basename: LABPROT, INR,  in the last 72 hours  No results found for this basename: AST, ALT, ALKPHOS, BILITOT, PROT, ALBUMIN,  in the last 168 hours   Lipase     Component Value Date/Time   LIPASE 23 06/21/2012 1630     Studies/Results: Dg Abd Portable 2v  03/26/2013  *RADIOLOGY REPORT*  Clinical Data: Abdominal pain.  PORTABLE ABDOMEN - 2 VIEW  Comparison: CT 03/25/2013  Findings: NG tube is present in the stomach.  Small bowel remains mildly prominent and dilated, similar to prior CT.  The right Nephro ureteral catheter remains in stable position.  No free air organomegaly.  IMPRESSION: Continued mid  abdominal small bowel dilatation compatible with small bowel obstruction.  No real change since prior CT.  NG tube in the stomach.   Original Report Authenticated By: Charlett Nose, M.D.     Medications: . sodium chloride   Intravenous BID  . antiseptic oral rinse  15 mL Mouth Rinse BID  . heparin subcutaneous  5,000 Units Subcutaneous Q8H  . tiotropium  18 mcg Inhalation QHS    Assessment/Plan SBO, prior Exploratory lap for SBO and lysis of adhesions. 2010  Hx of Recurrent Prostate CA with radiation seeds  Rectourethral fistula with cystectomy, ileal conduit and permanent end colostomy, 2002  Parastomal colostomy hernia  Repair of parastomal Hernia 2009  Chronic right ureteral stricture with stnets by IR  COPD  Depression  AAA Dehydration/malnutrition  Plan:  2 view films now, PICC, TNA is needed. I am going to give an extra dose of phenergan.  LOS: 3 days    Michael Novak 03/28/2013

## 2013-03-28 NOTE — Progress Notes (Signed)
Pt's ileal conduit/urostomy pouch was leaking from the actual pouch material. Old pouch removed, skin was cleansed with soap and water and new pouch was applied. Pt has no complaints or problems during this procedure. Will continue to monitor pt.   Arta Bruce [facility] 03/28/2013

## 2013-03-28 NOTE — Progress Notes (Signed)
ATTENDING ADDENDUM:  I personally reviewed patient's record, examined the patient, and formulated the following assessment and plan:  Patient having more nausea but tube is functioning well and in good position.  AXR shows less bowel distention.  Cont NG and fluid replacements.  Cr and UOP better with extra fluids.

## 2013-03-28 NOTE — Progress Notes (Signed)
[facility] PROGRESS NOTE  Michael Novak ZOX:[id number] DOB: [birth year] DOA: 03/25/2013 PCP: Kimber Relic, MD  HPI: 79 y.o. male has a past medical history significant for prostate cancer (urologist is Dr. Logan Bores) s/p radioactive seed implantation, complicated by prostatic rectal fistula status post cystoprostatectomy with ileal conduit as well as proctocolectomy with colostomy, as well as right-sided nephrostomy placed in July of 2013 (exchanged every 6 weeks), abdominal aortic aneurysm followed by vascular surgery as an outpatient with a stable appearance on repeat imaging, COPD, presents with a chief complaint of left flank and left lower quadrant abdominal pain that started yesterday. He denies any fever or chills. He endorses good colostomy output up until last night. He endorses nausea without vomiting. He has no chest pain, no breathing difficulties, he denies lightheadedness or dizziness. In the emergency room a CT scan of the abdomen was done which showed a high-grade small bowel obstruction in the lower lateral left pelvis without obvious obstructing cause. Surgery has been consulted, and due to multiple medical problems hospitalist service was asked for admission. Patient currently is in no acute distress, has an NG tube in place feels much more comfortable than when he came in. He denies any complaints and states that his abdominal pain is better in the ED.   Assessment/Plan:  Small bowel obstruction  - Surgery was consulted by the ED physician on admission, appreciate their input.  - Conservative management for now, patient to remain n.p.o., NG tube in place  - D5 half-normal as maintenance fluids  - phenergan for nausea, pain control - Continue current IV fluids, cr improving 1.37 today (from 1.52 on 4/29) -Followup abdominal x-ray today with improving bowel gas pattern-surgery to follow for final recommendations regarding? TPN  AAA  - stable per imaging   Bradycardia/1st  degree heart block - 2 sec pause on monitor overnight 4/28, asymptomatic, in sinus rhythm - cardiology saw him 4/28, no interventions.   Ureteral stricture  - nephrostomy in place, renal function close to baseline (~1.2)  - monitor UOP   COPD  - continue Inhalers when necessary   Hyperlipidemia  - hold oral meds while strict NPO.   Cholelithiasis - asymptomatic  Hydronephrosis - s/p stenting in 2013, good UOP  DVT prophylaxis  - heparin s.q. Since no planned surgery   Code Status: DNR Family Communication: so at bedside Disposition Plan: Pending clinical course  Consultants:  Surgery  Cardiolgogy  Procedures:  none  Antibiotics:  Anti-infectives   None     Antibiotics Given (last 72 hours)   None      Past antibiotics none  HPI/Subjective: -complaining of abdominal pain/cramping and nausea.colostomy bag empty. Son at the bedside  Objective: Filed Vitals:   03/27/13 1417 03/27/13 2146 03/28/13 0652 03/28/13 1300  BP: 120/82 119/72 132/64 116/77  Pulse: 85 94 60 70  Temp: 99.5 F (37.5 C) 98.4 F (36.9 C) 98.2 F (36.8 C) 98.5 F (36.9 C)  TempSrc: Oral Oral Oral Oral  Resp: 19 20 20 17   Height:      Weight:      SpO2: 94% 92% 96% 97%    Intake/Output Summary (Last 24 hours) at 03/28/13 1624 Last data filed at 03/28/13 1617  Gross per 24 hour  Intake 2427.08 ml  Output   1350 ml  Net 1077.08 ml   Filed Weights   03/25/13 1556  Weight: 88.724 kg (195 lb 9.6 oz)    Exam:   General:  NAD  HEENT-NG tube  in place  Cardiovascular: regular rate and rhythm, bradycardic  Respiratory: good air movement, clear to auscultation throughout, no wheezing, ronchi or rales  Abdomen: soft, mildly tender to palpation; colostomy and urostomy in place.   MSK: no peripheral edema  Neuro: CN 2-12 grossly intact, MS 5/5 in all 4  Data Reviewed: Basic Metabolic Panel:  Recent Labs Lab 03/25/13 1002 03/26/13 1100 03/27/13 0435  03/28/13 0443  NA 134* 136 136 139  K 4.5 4.5 4.0 3.6  CL 100 100 96 102  CO2 25 29 33* 29  GLUCOSE 118* 129* 124* 116*  BUN 24* 22 22 24*  CREATININE 1.33 1.41* 1.52* 1.37*  CALCIUM 9.7 9.1 9.2 8.5   CBC:  Recent Labs Lab 03/25/13 1002 03/27/13 0435 03/28/13 0443  WBC 5.3 4.3 4.2  NEUTROABS 3.7  --   --   HGB 15.0 14.6 12.7*  HCT 42.8 42.7 38.4*  MCV 89.0 90.5 90.8  PLT 164 167 147*   Studies: Dg Abd Portable 2v  03/28/2013  *RADIOLOGY REPORT*  Clinical Data: Small bowel obstruction.  PORTABLE ABDOMEN - 2 VIEW  Comparison: 03/26/2013.  Findings: An externalized right ureteral stent is noted.  The bowel gas pattern is improved.  Air and stool have been evacuated from the colon.  There are less dilated small bowel loops.  No free air. The NG tube is stable.  IMPRESSION: Improving bowel gas pattern with probable resolving small bowel obstruction.   Original Report Authenticated By: Rudie Meyer, M.D.     Scheduled Meds: . sodium chloride   Intravenous BID  . antiseptic oral rinse  15 mL Mouth Rinse BID  . heparin subcutaneous  5,000 Units Subcutaneous Q8H  . tiotropium  18 mcg Inhalation QHS   Continuous Infusions: . dextrose 5 % and 0.45% NaCl 125 mL/hr at 03/28/13 6213    Principal Problem:   SBO (small bowel obstruction) Active Problems:   Prostate cancer   Parastomal hernia at left colostomy   COPD (chronic obstructive pulmonary disease)   AAA (abdominal aortic aneurysm)   H/O colostomy   Rectourethral fistula   Ureteral stricture, right with chronic nephroureteral stenting    Hernia of pelvic floor   Time spent: 25  Donnalee Curry, MD [facility] Pager [phone]. If 7 PM - 7 AM, please contact night-coverage at [email or web address], password [facility] 03/28/2013, 4:24 PM  LOS: 3 days

## 2013-03-29 ENCOUNTER — Inpatient Hospital Stay (HOSPITAL_COMMUNITY): Payer: Medicare Other

## 2013-03-29 DIAGNOSIS — K56609 Unspecified intestinal obstruction, unspecified as to partial versus complete obstruction: Secondary | ICD-10-CM

## 2013-03-29 DIAGNOSIS — C61 Malignant neoplasm of prostate: Secondary | ICD-10-CM

## 2013-03-29 DIAGNOSIS — N135 Crossing vessel and stricture of ureter without hydronephrosis: Secondary | ICD-10-CM

## 2013-03-29 DIAGNOSIS — N360 Urethral fistula: Secondary | ICD-10-CM

## 2013-03-29 LAB — BASIC METABOLIC PANEL
CO2: 29 mEq/L (ref 19–32)
Chloride: 104 mEq/L (ref 96–112)
Creatinine, Ser: 1.35 mg/dL (ref 0.50–1.35)
Potassium: 3.1 mEq/L — ABNORMAL LOW (ref 3.5–5.1)

## 2013-03-29 LAB — CBC
HCT: 34.1 % — ABNORMAL LOW (ref 39.0–52.0)
Hemoglobin: 11.6 g/dL — ABNORMAL LOW (ref 13.0–17.0)
MCV: 90.7 fL (ref 78.0–100.0)
RDW: 14 % (ref 11.5–15.5)
WBC: 4.4 10*3/uL (ref 4.0–10.5)

## 2013-03-29 LAB — PREALBUMIN: Prealbumin: 13.6 mg/dL — ABNORMAL LOW (ref 17.0–34.0)

## 2013-03-29 LAB — URIC ACID: Uric Acid, Serum: 8.6 mg/dL — ABNORMAL HIGH (ref 4.0–7.8)

## 2013-03-29 MED ORDER — BISACODYL 10 MG RE SUPP
10.0000 mg | Freq: Once | RECTAL | Status: AC
  Administered 2013-03-29: 10 mg via RECTAL
  Filled 2013-03-29: qty 1

## 2013-03-29 MED ORDER — KCL IN DEXTROSE-NACL 40-5-0.45 MEQ/L-%-% IV SOLN
INTRAVENOUS | Status: AC
  Administered 2013-03-29 – 2013-03-30 (×3): via INTRAVENOUS
  Filled 2013-03-29 (×5): qty 1000

## 2013-03-29 NOTE — Progress Notes (Signed)
PT Cancellation Note  Patient Details Name: Michael Novak MRN: [id number] DOB: 1930-05-21   Cancelled Treatment:    Reason Eval/Treat Not Completed: Fatigue/lethargy limiting ability to participate Pt reports not feeling well and not agreeable to OOB today.  Discussed need to mobilize and attempt ambulation tomorrow.   Tanette Chauca,KATHrine E 03/29/2013, 2:12 PM Zenovia Jarred, PT, DPT 03/29/2013 Pager: [phone]

## 2013-03-29 NOTE — Progress Notes (Signed)
ATTENDING ADDENDUM:  I personally reviewed patient's record, examined the patient, and formulated the following assessment and plan:  Feeling better.  NG output decreasing.  No ostomy function yet.  Hopefully soon.

## 2013-03-29 NOTE — Progress Notes (Signed)
Patient ID: Michael Novak, male   DOB: [birth year], 79 y.o.   MRN: [id number]    Subjective: Pt feeling better today, no nausea, feels thirsty.  Objective: Vital signs in last 24 hours: Temp:  [98.4 F (36.9 C)-98.6 F (37 C)] 98.4 F (36.9 C) (05/01 0520) Pulse Rate:  [55-70] 55 (05/01 0520) Resp:  [17-18] 18 (05/01 0520) BP: (105-121)/(54-80) 105/54 mmHg (05/01 0520) SpO2:  [93 %-97 %] 93 % (05/01 0520) Last BM Date: 03/26/13   Intake/Output from previous day: 04/30 0701 - 05/01 0700 In: 3260.4 [I.V.:3260.4] Out: 1475 [Urine:675; Emesis/NG output:800] Intake/Output this shift:    General appearance: alert, NAD. Resp: clear to auscultation bilaterally GI: less distended, no output from ostomy, non tender  Lab Results:   Recent Labs  03/28/13 0443 03/29/13 0445  WBC 4.2 4.4  HGB 12.7* 11.6*  HCT 38.4* 34.1*  PLT 147* 148*    BMET  Recent Labs  03/28/13 0443 03/29/13 0445  NA 139 140  K 3.6 3.1*  CL 102 104  CO2 29 29  GLUCOSE 116* 115*  BUN 24* 22  CREATININE 1.37* 1.35  CALCIUM 8.5 8.2*   PT/INR No results found for this basename: LABPROT, INR,  in the last 72 hours  No results found for this basename: AST, ALT, ALKPHOS, BILITOT, PROT, ALBUMIN,  in the last 168 hours   Lipase     Component Value Date/Time   LIPASE 23 06/21/2012 1630     Studies/Results: Dg Abd Portable 2v  03/28/2013  *RADIOLOGY REPORT*  Clinical Data: Small bowel obstruction.  PORTABLE ABDOMEN - 2 VIEW  Comparison: 03/26/2013.  Findings: An externalized right ureteral stent is noted.  The bowel gas pattern is improved.  Air and stool have been evacuated from the colon.  There are less dilated small bowel loops.  No free air. The NG tube is stable.  IMPRESSION: Improving bowel gas pattern with probable resolving small bowel obstruction.   Original Report Authenticated By: Rudie Meyer, M.D.     Medications: . sodium chloride   Intravenous BID  . antiseptic oral rinse  15 mL  Mouth Rinse BID  . bisacodyl  10 mg Rectal Once  . heparin subcutaneous  5,000 Units Subcutaneous Q8H  . tiotropium  18 mcg Inhalation QHS    Assessment/Plan SBO, prior Exploratory lap for SBO and lysis of adhesions. 2010  Hx of Recurrent Prostate CA with radiation seeds  Rectourethral fistula with cystectomy, ileal conduit and permanent end colostomy, 2002  Parastomal colostomy hernia  Repair of parastomal Hernia 2009  Chronic right ureteral stricture with stnets by IR  COPD  Depression  AAA Dehydration/malnutrition  Plan: better today, will allow 2 cups on ice only per shift, per Dr. Maisie Fus he may have a supp per ostomy today, PICC, TNA    LOS: 4 days    Carlyann Placide 03/29/2013

## 2013-03-29 NOTE — Progress Notes (Signed)
Patient has tolerated two cups of ice without nausea. Will continue to monitor patient. Setzer, Don Broach

## 2013-03-29 NOTE — Progress Notes (Signed)
[facility] PROGRESS NOTE  Michael Novak:[id number] DOB: 21-Nov-1930 DOA: 03/25/2013 PCP: Kimber Relic, MD  HPI: 79 y.o. male has a past medical history significant for prostate cancer (urologist is Dr. Logan Bores) s/p radioactive seed implantation, complicated by prostatic rectal fistula status post cystoprostatectomy with ileal conduit as well as proctocolectomy with colostomy, as well as right-sided nephrostomy placed in July of 2013 (exchanged every 6 weeks), abdominal aortic aneurysm followed by vascular surgery as an outpatient with a stable appearance on repeat imaging, COPD, presents with a chief complaint of left flank and left lower quadrant abdominal pain that started yesterday. He denies any fever or chills. He endorses good colostomy output up until last night. He endorses nausea without vomiting. He has no chest pain, no breathing difficulties, he denies lightheadedness or dizziness. In the emergency room a CT scan of the abdomen was done which showed a high-grade small bowel obstruction in the lower lateral left pelvis without obvious obstructing cause. Surgery has been consulted, and due to multiple medical problems hospitalist service was asked for admission. Patient currently is in no acute distress, has an NG tube in place feels much more comfortable than when he came in. He denies any complaints and states that his abdominal pain is better in the ED.   Assessment/Plan:  Small bowel obstruction  - Surgery was consulted by the ED physician on admission, appreciate their input.  - Conservative management for now, patient to remain n.p.o., NG tube in place  - D5 half-normal as maintenance fluids  - phenergan for nausea, pain control - Continue current IV fluids, cr improving 1.37 today (from 1.52 on 4/29) -Followup abdominal x-ray 4/30 with improving bowel gas pattern-surgery to follow for final recommendations regarding? TPN -Patient still with empty colostomy bag, await  surgery follow up for further recommendations.  AAA  - stable per imaging   Bradycardia/1st degree heart block - 2 sec pause on monitor overnight 4/28, asymptomatic, in sinus rhythm - cardiology saw him 4/28, no interventions.   Ureteral stricture  - nephrostomy in place, renal function close to baseline (~1.2)  - monitor UOP   COPD  - continue Inhalers when necessary   Hyperlipidemia  - hold oral meds while strict NPO.   Cholelithiasis - asymptomatic  Hydronephrosis - s/p stenting in 2013, good UOP  DVT prophylaxis  - heparin s.q. Since no planned surgery   Code Status: DNR Family Communication: no family at bedside Disposition Plan: Pending clinical course  Consultants:  Surgery  Cardiolgogy  Procedures:  none  Antibiotics:  Anti-infectives   None     Antibiotics Given (last 72 hours)   None      Past antibiotics none  HPI/Subjective: -complaining of abdominal pain/cramping and nausea.colostomy bag empty. Son at the bedside  Objective: Filed Vitals:   03/28/13 8119 03/28/13 1300 03/28/13 2126 03/29/13 0520  BP: 132/64 116/77 121/80 105/54  Pulse: 60 70 70 55  Temp: 98.2 F (36.8 C) 98.5 F (36.9 C) 98.6 F (37 C) 98.4 F (36.9 C)  TempSrc: Oral Oral Oral Oral  Resp: 20 17 18 18   Height:      Weight:      SpO2: 96% 97% 97% 93%    Intake/Output Summary (Last 24 hours) at 03/29/13 0948 Last data filed at 03/29/13 0655  Gross per 24 hour  Intake 2510.42 ml  Output   1325 ml  Net 1185.42 ml   Filed Weights   03/25/13 1556  Weight: 88.724 kg (195 lb  9.6 oz)    Exam:   General:  NAD  HEENT-NG tube in place  Cardiovascular: regular rate and rhythm, bradycardic  Respiratory: good air movement, clear to auscultation throughout, no wheezing, ronchi or rales  Abdomen: soft, mildly tender to palpation; colostomy-empty and urostomy in place.   MSK: no peripheral edema  Neuro: CN 2-12 grossly intact, MS 5/5 in all 4  Data  Reviewed: Basic Metabolic Panel:  Recent Labs Lab 03/25/13 1002 03/26/13 1100 03/27/13 0435 03/28/13 0443 03/29/13 0445  NA 134* 136 136 139 140  K 4.5 4.5 4.0 3.6 3.1*  CL 100 100 96 102 104  CO2 25 29 33* 29 29  GLUCOSE 118* 129* 124* 116* 115*  BUN 24* 22 22 24* 22  CREATININE 1.33 1.41* 1.52* 1.37* 1.35  CALCIUM 9.7 9.1 9.2 8.5 8.2*  MG  --   --   --   --  1.9   CBC:  Recent Labs Lab 03/25/13 1002 03/27/13 0435 03/28/13 0443 03/29/13 0445  WBC 5.3 4.3 4.2 4.4  NEUTROABS 3.7  --   --   --   HGB 15.0 14.6 12.7* 11.6*  HCT 42.8 42.7 38.4* 34.1*  MCV 89.0 90.5 90.8 90.7  PLT 164 167 147* 148*   Studies: Dg Abd Portable 2v  03/28/2013  *RADIOLOGY REPORT*  Clinical Data: Small bowel obstruction.  PORTABLE ABDOMEN - 2 VIEW  Comparison: 03/26/2013.  Findings: An externalized right ureteral stent is noted.  The bowel gas pattern is improved.  Air and stool have been evacuated from the colon.  There are less dilated small bowel loops.  No free air. The NG tube is stable.  IMPRESSION: Improving bowel gas pattern with probable resolving small bowel obstruction.   Original Report Authenticated By: Rudie Meyer, M.D.     Scheduled Meds: . sodium chloride   Intravenous BID  . antiseptic oral rinse  15 mL Mouth Rinse BID  . bisacodyl  10 mg Rectal Once  . heparin subcutaneous  5,000 Units Subcutaneous Q8H  . tiotropium  18 mcg Inhalation QHS   Continuous Infusions: . dextrose 5 % and 0.45 % NaCl with KCl 40 mEq/L      Principal Problem:   SBO (small bowel obstruction) Active Problems:   Prostate cancer   Parastomal hernia at left colostomy   COPD (chronic obstructive pulmonary disease)   AAA (abdominal aortic aneurysm)   H/O colostomy   Rectourethral fistula   Ureteral stricture, right with chronic nephroureteral stenting    Hernia of pelvic floor   Time spent: 25  Donnalee Curry, MD [facility] Pager [phone]. If 7 PM - 7 AM, please contact  night-coverage at [email or web address], password [facility] 03/29/2013, 9:48 AM  LOS: 4 days

## 2013-03-30 LAB — GLUCOSE, CAPILLARY
Glucose-Capillary: 132 mg/dL — ABNORMAL HIGH (ref 70–99)
Glucose-Capillary: 93 mg/dL (ref 70–99)

## 2013-03-30 MED ORDER — POTASSIUM CHLORIDE 10 MEQ/100ML IV SOLN
10.0000 meq | INTRAVENOUS | Status: AC
  Administered 2013-03-30 (×4): 10 meq via INTRAVENOUS
  Filled 2013-03-30 (×4): qty 100

## 2013-03-30 MED ORDER — SODIUM CHLORIDE 0.9 % IJ SOLN
10.0000 mL | INTRAMUSCULAR | Status: DC | PRN
  Administered 2013-04-03: 10 mL

## 2013-03-30 MED ORDER — INSULIN ASPART 100 UNIT/ML ~~LOC~~ SOLN
0.0000 [IU] | Freq: Four times a day (QID) | SUBCUTANEOUS | Status: DC
  Administered 2013-03-31 – 2013-04-02 (×11): 1 [IU] via SUBCUTANEOUS
  Administered 2013-04-03: 2 [IU] via SUBCUTANEOUS
  Administered 2013-04-03 – 2013-04-04 (×5): 1 [IU] via SUBCUTANEOUS
  Administered 2013-04-04: 2 [IU] via SUBCUTANEOUS
  Administered 2013-04-05 (×2): 1 [IU] via SUBCUTANEOUS

## 2013-03-30 MED ORDER — TRACE MINERALS CR-CU-F-FE-I-MN-MO-SE-ZN IV SOLN
INTRAVENOUS | Status: AC
  Administered 2013-03-30: 19:00:00 via INTRAVENOUS
  Filled 2013-03-30: qty 1000

## 2013-03-30 MED ORDER — KCL IN DEXTROSE-NACL 40-5-0.45 MEQ/L-%-% IV SOLN
INTRAVENOUS | Status: AC
  Administered 2013-03-30: 17:00:00 via INTRAVENOUS
  Filled 2013-03-30: qty 1000

## 2013-03-30 MED ORDER — FAT EMULSION 20 % IV EMUL
240.0000 mL | INTRAVENOUS | Status: AC
  Administered 2013-03-30: 240 mL via INTRAVENOUS
  Filled 2013-03-30: qty 250

## 2013-03-30 NOTE — Progress Notes (Signed)
Physical Therapy Treatment Patient Details Name: Michael Novak MRN: [id number] DOB: 09-16-1930 Today's Date: 03/30/2013 Time: 4098-1191 PT Time Calculation (min): 28 min  PT Assessment / Plan / Recommendation Comments on Treatment Session  Pt OOB in recliner.  Required MAX encouragement to participate. Advised him the benefits.  Son present during session.  Left a RW in room and advised pt and son to amb over the weekend with staff assistance.      Follow Up Recommendations  No PT follow up     Does the patient have the potential to tolerate intense rehabilitation     Barriers to Discharge        Equipment Recommendations  None recommended by PT    Recommendations for Other Services    Frequency Min 3X/week   Plan      Precautions / Restrictions Precautions Precautions: Fall Precaution Comments: NG tube/NPO/colostomy   Pertinent Vitals/Pain C/o ABD discomfort    Mobility  Bed Mobility Bed Mobility: Not assessed Details for Bed Mobility Assistance: pt up in recliner upon arrival Transfers Transfers: Sit to Stand;Stand to Sit Sit to Stand: 4: Min guard;With upper extremity assist;From chair/3-in-1 Stand to Sit: 4: Min guard;With upper extremity assist;To chair/3-in-1 Details for Transfer Assistance: verbal cues for hand placement and increased time Ambulation/Gait Ambulation/Gait Assistance: 4: Min guard Ambulation Distance (Feet): 285 Feet Assistive device: Rolling walker Ambulation/Gait Assistance Details: increased time with slightly flex trunk due to ABD discomfort.  Pt like to the use of a walker for increased support.  Son present.  Pt did fatigue at end demon limited activity tolerance. Gait Pattern: Step-through pattern;Decreased stride length;Trunk flexed Gait velocity: decreased at end Stairs: Yes Stairs Assistance: 4: Min guard Stair Management Technique: No rails;Forwards;With walker    PT Goals                                                progressing     Visit Information  Last PT Received On: 03/30/13 Assistance Needed: +1    Subjective Data      Cognition       Balance   fair +  End of Session PT - End of Session Equipment Utilized During Treatment: Gait belt Activity Tolerance: Patient limited by fatigue Patient left: in chair;with call bell/phone within reach   Felecia Shelling  PTA [facility]  Acute  Rehab Pager      [phone]

## 2013-03-30 NOTE — Progress Notes (Signed)
INITIAL NUTRITION ASSESSMENT  DOCUMENTATION CODES Per approved criteria  -Not Applicable   INTERVENTION: TPN per PharmD  NUTRITION DIAGNOSIS: Inadequate oral intake related to SBO and inability to eat as evidenced by NPO status.   Goal: Pt to meet >/= 90% of their estimated nutrition needs  Monitor:  TPN initiation and rate Bowel Function Weight Labs  Reason for Assessment: Consult: New TPN  79 y.o. male  Admitting Dx: SBO (small bowel obstruction)  ASSESSMENT: 17 yom with h/o prostate cancer presented 4/27 with c/o L flank and LLQ abd pain. CT with high grade SBO. Surgery on board - conservative management recommended. Pt continues to be NPO since admission. Surgery ordered for PICC and TNA to start 5/2. Plan diet advancement when bowel function returns.  Pt reports that PTA he was eating 3 meals daily and had a normal appetite. Pt states that he usually weighs 200 lbs.  Height: Ht Readings from Last 1 Encounters:  03/25/13 6\' 2"  (1.88 m)    Weight: Wt Readings from Last 1 Encounters:  03/25/13 195 lb 9.6 oz (88.724 kg)    Ideal Body Weight: 190 lbs  % Ideal Body Weight: 103%  Wt Readings from Last 10 Encounters:  03/25/13 195 lb 9.6 oz (88.724 kg)  01/22/13 202 lb 9.6 oz (91.899 kg)  10/24/12 200 lb 9.6 oz (90.992 kg)  07/26/12 218 lb (98.884 kg)  06/21/12 218 lb 0.6 oz (98.9 kg)  04/11/12 218 lb (98.884 kg)  01/25/12 227 lb 11.2 oz (103.284 kg)  12/13/11 226 lb 13.7 oz (102.9 kg)  11/26/11 226 lb (102.513 kg)    Usual Body Weight: 200 lbs per pt report  % Usual Body Weight: 98%  BMI:  Body mass index is 25.1 kg/(m^2).  Estimated Nutritional Needs: Kcal: 2220-2480 Protein: 106-124 grams Fluid: 2.5 L  Skin: intact; non-pitting RUE and LUE edema  Diet Order: NPO  EDUCATION NEEDS: -No education needs identified at this time   Intake/Output Summary (Last 24 hours) at 03/30/13 1506 Last data filed at 03/30/13 0549  Gross per 24 hour  Intake  2307.92 ml  Output   1175 ml  Net 1132.92 ml    Last BM: 4/28 (colostomy)  Labs:   Recent Labs Lab 03/27/13 0435 03/28/13 0443 03/29/13 0445  NA 136 139 140  K 4.0 3.6 3.1*  CL 96 102 104  CO2 33* 29 29  BUN 22 24* 22  CREATININE 1.52* 1.37* 1.35  CALCIUM 9.2 8.5 8.2*  MG  --   --  1.9  GLUCOSE 124* 116* 115*    CBG (last 3)  No results found for this basename: GLUCAP,  in the last 72 hours  Scheduled Meds: . sodium chloride   Intravenous BID  . antiseptic oral rinse  15 mL Mouth Rinse BID  . heparin subcutaneous  5,000 Units Subcutaneous Q8H  . insulin aspart  0-9 Units Subcutaneous Q6H  . potassium chloride  10 mEq Intravenous Q1 Hr x 4  . tiotropium  18 mcg Inhalation QHS    Continuous Infusions: . dextrose 5 % and 0.45 % NaCl with KCl 40 mEq/L 80 mL/hr at 03/30/13 1322  . dextrose 5 % and 0.45 % NaCl with KCl 40 mEq/L    . Marland KitchenTPN (CLINIMIX-E) Adult     And  . fat emulsion      Past Medical History  Diagnosis Date  . History of colostomy   . Urostomy stenosis     Ask patient to clarify, per history  form dated 04/02/11.  . Prostate cancer   . Skin cancer   . Neuropathy     in feet   . COPD (chronic obstructive pulmonary disease)   . Depression   . UTI (lower urinary tract infection)     history   . AAA (abdominal aortic aneurysm)   . 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 Dyslipidemia 12/14/2011  . Bradycardia, HR 30-40s, Nov-Dec2012 11/26/2011  . Rectourethral fistula     s/p cystectomy / ileal conduit / ostomy    Past Surgical History  Procedure Laterality Date  . Cystectomy, ileal coduit, colostomy  2002    for severe rectourethral fistula  . Ex lap, lysis of adhesions  2010    for SBO  . Lap repair of parastomal hernias  2009    Dr. Barnetta Chapel [facility],  Hematoma evac POD#1  . Eye surgery      cataracts removed    Ian Malkin RD, LDN Inpatient Clinical Dietitian Pager: [phone] After Hours Pager: [phone]

## 2013-03-30 NOTE — Progress Notes (Signed)
Peripherally Inserted Central Catheter/Midline Placement  The IV Nurse has discussed with the patient and/or persons authorized to consent for the patient, the purpose of this procedure and the potential benefits and risks involved with this procedure.  The benefits include less needle sticks, lab draws from the catheter and patient may be discharged home with the catheter.  Risks include, but not limited to, infection, bleeding, blood clot (thrombus formation), and puncture of an artery; nerve damage and irregular heat beat.  Alternatives to this procedure were also discussed.  PICC/Midline Placement Documentation        Stacie Glaze Horton 03/30/2013, 5:01 PM

## 2013-03-30 NOTE — Progress Notes (Signed)
PARENTERAL NUTRITION CONSULT NOTE - INITIAL  Pharmacy Consult for TNA Indication: SBO   Allergies  Allergen Reactions  . Augmentin (Amoxicillin-Pot Clavulanate) Other (See Comments)    Reaction unknown.    Patient Measurements: Height: 6\' 2"  (188 cm) Weight: 195 lb 9.6 oz (88.724 kg) IBW/kg (Calculated) : 82.2 Adjusted Body Weight: 84.2  Vital Signs: Temp: 99.4 F (37.4 C) (05/02 0530) Temp src: Oral (05/02 0530) BP: 136/76 mmHg (05/02 0530) Pulse Rate: 79 (05/02 0530) Intake/Output from previous day: 05/01 0701 - 05/02 0700 In: 3279.2 [I.V.:2799.2] Out: 1825 [Urine:1225; Emesis/NG output:600] Intake/Output from this shift:    Labs:  Recent Labs  03/28/13 0443 03/29/13 0445  WBC 4.2 4.4  HGB 12.7* 11.6*  HCT 38.4* 34.1*  PLT 147* 148*     Recent Labs  03/28/13 0443 03/28/13 1503 03/29/13 0445  NA 139  --  140  K 3.6  --  3.1*  CL 102  --  104  CO2 29  --  29  GLUCOSE 116*  --  115*  BUN 24*  --  22  CREATININE 1.37*  --  1.35  CALCIUM 8.5  --  8.2*  MG  --   --  1.9  PREALBUMIN  --  13.6*  --    Estimated Creatinine Clearance: 50.7 ml/min (by C-G formula based on Cr of 1.35).   No results found for this basename: GLUCAP,  in the last 72 hours  Medical History: Past Medical History  Diagnosis Date  . History of colostomy   . Urostomy stenosis     Ask patient to clarify, per history form dated 04/02/11.  . Prostate cancer   . Skin cancer   . Neuropathy     in feet   . COPD (chronic obstructive pulmonary disease)   . Depression   . UTI (lower urinary tract infection)     history   . AAA (abdominal aortic aneurysm)   . 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 Dyslipidemia 12/14/2011  . Bradycardia, HR 30-40s, Nov-Dec2012 11/26/2011  . Rectourethral fistula     s/p cystectomy / ileal conduit / ostomy    Assessment:  18 yom with h/o prostate cancer presented 4/27 with c/o L flank and LLQ abd pain. CT with high grade SBO. Surgery on  board - conservative management recommended. Pt continues to be NPO since admission.  Surgery ordered for PICC and TNA to start 5/2.  Plan diet advancement when bowel function returns.   Nutritional Goals:  - RD recommendations: pending - Clinimix E 5/20 @ 90 ml/hr + IVF 20% at 10 ml/hr on MWF will provide: 108 g protein and 2107 Kcal on average day  Current nutrition:  - Diet: NPO (tolerating ice chips) - TNA: starting 5/2 - mIVF: D5-1/2 NS w/ KCl 40 mEq/L @ 80 ml/hr   CBGs & Insulin requirements past 24 hours:  - controlled, no insulin  Labs: Electrolytes: K+ 3.1, Mag ok, Phos not checked Renal Function: Scr 1.35, improving Hepatic Function: follow up 5/2 Pre-Albumin: 13.6 on 4/30 Triglycerides: follow up on 5/3 CBGs: controlled - no h/o DM   Plan:  At 1800 tonight  Start Clinimix E 5/20 @ 40 ml/hr once PICC line is placed.  IV team will notify pharmacy once this is done to make TNA.   TNA to contain IV fat emulsion 20% at 10 ml/hr on MWF only due to ongoing shortage  Standard multivitamins and trace elements on MWF only due to ongoing shortage  Give 4 runs of Kcl for now   Reduce IVF to 40 ml/hr.  Add sensitive SSI q6h  TNA labs Monday/Thursdays  Dietician consult  Pharmacy will follow up daily  Geoffry Paradise, PharmD, BCPS Pager: [phone] 2:04 PM Pharmacy #: [id number]

## 2013-03-30 NOTE — Progress Notes (Signed)
ATTENDING ADDENDUM:  I personally reviewed patient's record, examined the patient, and formulated the following assessment and plan:  It appears that he is slowly getting better.  Will start TPN for nutrition.  Pt does not want an operation.  Will cont NG decompression for now.  Starting to 3rd space some.  Cr and UOP much better.  Will decrease IVF replacements.

## 2013-03-30 NOTE — Progress Notes (Signed)
Subjective: Pt feels pretty good.  No nausea/vomiting, abdominal pain improved.  Tolerating ice chips.  No ostomy output or gas yet.  Ambulating OOB through halls.   Objective: Vital signs in last 24 hours: Temp:  [98.7 F (37.1 C)-100.7 F (38.2 C)] 99.4 F (37.4 C) (05/02 0530) Pulse Rate:  [68-79] 79 (05/02 0530) Resp:  [16-20] 20 (05/02 0530) BP: (112-136)/(57-76) 136/76 mmHg (05/02 0530) SpO2:  [90 %-98 %] 98 % (05/02 0530) Last BM Date: 03/26/13  Intake/Output from previous day: 05/01 0701 - 05/02 0700 In: 3279.2 [I.V.:2799.2] Out: 1825 [Urine:1225; Emesis/NG output:600] Intake/Output this shift:    PE: Gen:  Alert, NAD, pleasant Abd: Soft, NT/ND, diminished BS, no HSM, rectourethral fistula without output, and permanent end colostomy without gas or BM in bag.  Lab Results:   Recent Labs  03/28/13 0443 03/29/13 0445  WBC 4.2 4.4  HGB 12.7* 11.6*  HCT 38.4* 34.1*  PLT 147* 148*   BMET  Recent Labs  03/28/13 0443 03/29/13 0445  NA 139 140  K 3.6 3.1*  CL 102 104  CO2 29 29  GLUCOSE 116* 115*  BUN 24* 22  CREATININE 1.37* 1.35  CALCIUM 8.5 8.2*   PT/INR No results found for this basename: LABPROT, INR,  in the last 72 hours CMP     Component Value Date/Time   NA 140 03/29/2013 0445   K 3.1* 03/29/2013 0445   CL 104 03/29/2013 0445   CO2 29 03/29/2013 0445   GLUCOSE 115* 03/29/2013 0445   BUN 22 03/29/2013 0445   CREATININE 1.35 03/29/2013 0445   CALCIUM 8.2* 03/29/2013 0445   PROT 8.0 06/21/2012 1630   ALBUMIN 4.4 06/21/2012 1630   AST 20 06/21/2012 1630   ALT 16 06/21/2012 1630   ALKPHOS 51 06/21/2012 1630   BILITOT 0.6 06/21/2012 1630   GFRNONAA 48* 03/29/2013 0445   GFRAA 56* 03/29/2013 0445   Lipase     Component Value Date/Time   LIPASE 23 06/21/2012 1630       Studies/Results: Dg Hand 2 View Right  03/29/2013  *RADIOLOGY REPORT*  Clinical Data: Pain in the middle finger.  RIGHT HAND - 2 VIEW  Comparison: None.  Findings: Severe degenerative  arthropathy of the first carpometacarpal articulation noted with lateral subluxation of the base of the first metacarpal and prominent irregular spur formation.  Spurring noted in the first MCP joint and interphalangeal joint of the thumb.  Proximal interphalangeal joint spurring also noted in the index, middle, and ring fingers.  No specific marginal erosions of gout observed.  There does appears that to be subcutaneous edema in the wrist and hand, particularly affecting the middle finger.  No underlying fracture noted.  IMPRESSION:  1.  Soft tissue swelling in the wrist and hand, especially involving the middle finger.  No definite osseous erosion characteristic of gout observed. 2.  Severe degenerative findings at the first carpometacarpal articulation, with scattered degenerative spurring elsewhere in the hand.   Original Report Authenticated By: Gaylyn Rong, M.D.     Anti-infectives: Anti-infectives   None       Assessment/Plan SBO, prior Exploratory lap for SBO and lysis of adhesions. 2010  Hx of Recurrent Prostate CA with radiation seeds  Rectourethral fistula with cystectomy, ileal conduit and permanent end colostomy, 2002  Parastomal colostomy hernia  Repair of parastomal Hernia 2009  Chronic right ureteral stricture with stnets by IR  COPD  Depression  AAA Malnutrition  Volume overload?  Edematous hands and feet  Plan:  1.  Better today, cont NPO, Will start TPN and PICC for malnutrition (PCM) 2.  Advance diet when bowel function returns 3.  Held extra fluid for NG losses for now given edema 4.  Ambulate and IS 5.  SCD's and heparin     LOS: 5 days    DORT, Candi Profit 03/30/2013, 11:15 AM Pager: [phone]

## 2013-03-30 NOTE — Progress Notes (Signed)
[facility] PROGRESS NOTE  Michael Novak VWU:[id number] DOB: 1930/04/15 DOA: 03/25/2013 PCP: Kimber Relic, MD  HPI: 79 y.o. male has a past medical history significant for prostate cancer (urologist is Dr. Logan Bores) s/p radioactive seed implantation, complicated by prostatic rectal fistula status post cystoprostatectomy with ileal conduit as well as proctocolectomy with colostomy, as well as right-sided nephrostomy placed in July of 2013 (exchanged every 6 weeks), abdominal aortic aneurysm followed by vascular surgery as an outpatient with a stable appearance on repeat imaging, COPD, presents with a chief complaint of left flank and left lower quadrant abdominal pain that started yesterday. He denies any fever or chills. He endorses good colostomy output up until last night. He endorses nausea without vomiting. He has no chest pain, no breathing difficulties, he denies lightheadedness or dizziness. In the emergency room a CT scan of the abdomen was done which showed a high-grade small bowel obstruction in the lower lateral left pelvis without obvious obstructing cause. Surgery has been consulted, and due to multiple medical problems hospitalist service was asked for admission. Patient currently is in no acute distress, has an NG tube in place feels much more comfortable than when he came in. He denies any complaints and states that his abdominal pain is better in the ED.   Assessment/Plan:  Small bowel obstruction  - Surgery was consulted by the ED physician on admission, appreciate their input.  - Conservative management for now, patient to remain n.p.o., NG tube in place  - D5 half-normal as maintenance fluids  - phenergan for nausea, pain control - Continue current IV fluids, cr improving 1.37 today (from 1.52 on 4/29) -Followup abdominal x-ray 4/30 with improving bowel gas pattern -Patient still with empty colostomy bag -Started on TPN today per surgery &continuing NG follow.  AAA  -  stable per imaging   Bradycardia/1st degree heart block - 2 sec pause on monitor overnight 4/28, asymptomatic, in sinus rhythm - cardiology saw him 4/28, no interventions.   Ureteral stricture  - nephrostomy in place, renal function close to baseline (~1.2)  - monitor UOP   COPD  - continue Inhalers when necessary   Hyperlipidemia  - hold oral meds while strict NPO.   Cholelithiasis - asymptomatic  Hydronephrosis - s/p stenting in 2013, good UOP Hypokalemia -Recheck potassium in a.m  DVT prophylaxis  - heparin s.q. Since no planned surgery   Code Status: DNR Family Communication: no family at bedside Disposition Plan: Pending clinical course  Consultants:  Surgery  Cardiolgogy  Procedures:  none  Antibiotics:  Anti-infectives   None     Antibiotics Given (last 72 hours)   None      Past antibiotics none  HPI/Subjective: -denies abdominal pain. No flatus, colostomy bag remains empty. Ambulated with PT this a.m.  Objective: Filed Vitals:   03/29/13 2245 03/30/13 0024 03/30/13 0530 03/30/13 1300  BP: 134/57  136/76 125/65  Pulse: 71  79 77  Temp: 100.7 F (38.2 C) 99.2 F (37.3 C) 99.4 F (37.4 C) 98.9 F (37.2 C)  TempSrc: Oral Oral Oral Oral  Resp: 18  20 17   Height:      Weight:      SpO2: 90%  98% 99%    Intake/Output Summary (Last 24 hours) at 03/30/13 1609 Last data filed at 03/30/13 0549  Gross per 24 hour  Intake 1682.92 ml  Output   1175 ml  Net 507.92 ml   Filed Weights   03/25/13 1556  Weight: 88.724 kg (  195 lb 9.6 oz)    Exam:   General:  NAD  HEENT-NG tube in place  Cardiovascular: regular rate and rhythm, bradycardic  Respiratory: good air movement, clear to auscultation throughout, no wheezing, ronchi or rales  Abdomen: soft, mildly tender to palpation; colostomy-empty and urostomy in place.   MSK: no peripheral edema  Neuro: CN 2-12 grossly intact, MS 5/5 in all 4  Data Reviewed: Basic Metabolic  Panel:  Recent Labs Lab 03/25/13 1002 03/26/13 1100 03/27/13 0435 03/28/13 0443 03/29/13 0445  NA 134* 136 136 139 140  K 4.5 4.5 4.0 3.6 3.1*  CL 100 100 96 102 104  CO2 25 29 33* 29 29  GLUCOSE 118* 129* 124* 116* 115*  BUN 24* 22 22 24* 22  CREATININE 1.33 1.41* 1.52* 1.37* 1.35  CALCIUM 9.7 9.1 9.2 8.5 8.2*  MG  --   --   --   --  1.9   CBC:  Recent Labs Lab 03/25/13 1002 03/27/13 0435 03/28/13 0443 03/29/13 0445  WBC 5.3 4.3 4.2 4.4  NEUTROABS 3.7  --   --   --   HGB 15.0 14.6 12.7* 11.6*  HCT 42.8 42.7 38.4* 34.1*  MCV 89.0 90.5 90.8 90.7  PLT 164 167 147* 148*   Studies: Dg Hand 2 View Right  03/29/2013  *RADIOLOGY REPORT*  Clinical Data: Pain in the middle finger.  RIGHT HAND - 2 VIEW  Comparison: None.  Findings: Severe degenerative arthropathy of the first carpometacarpal articulation noted with lateral subluxation of the base of the first metacarpal and prominent irregular spur formation.  Spurring noted in the first MCP joint and interphalangeal joint of the thumb.  Proximal interphalangeal joint spurring also noted in the index, middle, and ring fingers.  No specific marginal erosions of gout observed.  There does appears that to be subcutaneous edema in the wrist and hand, particularly affecting the middle finger.  No underlying fracture noted.  IMPRESSION:  1.  Soft tissue swelling in the wrist and hand, especially involving the middle finger.  No definite osseous erosion characteristic of gout observed. 2.  Severe degenerative findings at the first carpometacarpal articulation, with scattered degenerative spurring elsewhere in the hand.   Original Report Authenticated By: Gaylyn Rong, M.D.     Scheduled Meds: . sodium chloride   Intravenous BID  . antiseptic oral rinse  15 mL Mouth Rinse BID  . heparin subcutaneous  5,000 Units Subcutaneous Q8H  . insulin aspart  0-9 Units Subcutaneous Q6H  . potassium chloride  10 mEq Intravenous Q1 Hr x 4  .  tiotropium  18 mcg Inhalation QHS   Continuous Infusions: . dextrose 5 % and 0.45 % NaCl with KCl 40 mEq/L 80 mL/hr at 03/30/13 1322  . dextrose 5 % and 0.45 % NaCl with KCl 40 mEq/L    . Marland KitchenTPN (CLINIMIX-E) Adult     And  . 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 If 7 PM - 7 AM, please contact night-coverage at [email or web address], password [facility] 03/30/2013, 4:09 PM  LOS: 5 days

## 2013-03-31 ENCOUNTER — Inpatient Hospital Stay (HOSPITAL_COMMUNITY): Payer: Medicare Other

## 2013-03-31 DIAGNOSIS — M109 Gout, unspecified: Secondary | ICD-10-CM

## 2013-03-31 LAB — DIFFERENTIAL
Basophils Relative: 0 % (ref 0–1)
Eosinophils Absolute: 0 10*3/uL (ref 0.0–0.7)
Lymphs Abs: 1.3 10*3/uL (ref 0.7–4.0)
Neutro Abs: 3.2 10*3/uL (ref 1.7–7.7)
Neutrophils Relative %: 59 % (ref 43–77)

## 2013-03-31 LAB — CBC
MCH: 31.1 pg (ref 26.0–34.0)
MCHC: 34.8 g/dL (ref 30.0–36.0)
Platelets: 168 10*3/uL (ref 150–400)
RBC: 3.67 MIL/uL — ABNORMAL LOW (ref 4.22–5.81)

## 2013-03-31 LAB — COMPREHENSIVE METABOLIC PANEL
ALT: 8 U/L (ref 0–53)
AST: 14 U/L (ref 0–37)
Albumin: 2.5 g/dL — ABNORMAL LOW (ref 3.5–5.2)
Calcium: 8.8 mg/dL (ref 8.4–10.5)
GFR calc Af Amer: 72 mL/min — ABNORMAL LOW (ref >90)
Glucose, Bld: 124 mg/dL — ABNORMAL HIGH (ref 70–99)
Potassium: 3.6 mEq/L (ref 3.5–5.1)
Sodium: 137 mEq/L (ref 135–145)
Total Protein: 6.1 g/dL (ref 6.0–8.3)

## 2013-03-31 LAB — CHOLESTEROL, TOTAL: Cholesterol: 128 mg/dL (ref 0–200)

## 2013-03-31 LAB — PHOSPHORUS: Phosphorus: 2.8 mg/dL (ref 2.3–4.6)

## 2013-03-31 LAB — TRIGLYCERIDES: Triglycerides: 105 mg/dL (ref <150)

## 2013-03-31 MED ORDER — KCL IN DEXTROSE-NACL 40-5-0.45 MEQ/L-%-% IV SOLN
INTRAVENOUS | Status: DC
  Administered 2013-04-02 – 2013-04-07 (×3): via INTRAVENOUS
  Filled 2013-03-31 (×6): qty 1000

## 2013-03-31 MED ORDER — CLINIMIX E/DEXTROSE (5/20) 5 % IV SOLN
INTRAVENOUS | Status: AC
  Administered 2013-03-31: 17:00:00 via INTRAVENOUS
  Filled 2013-03-31: qty 2000

## 2013-03-31 MED ORDER — KETOROLAC TROMETHAMINE 30 MG/ML IJ SOLN
30.0000 mg | Freq: Three times a day (TID) | INTRAMUSCULAR | Status: AC | PRN
  Administered 2013-04-03 – 2013-04-05 (×4): 30 mg via INTRAVENOUS
  Filled 2013-03-31 (×5): qty 1

## 2013-03-31 NOTE — Progress Notes (Signed)
PARENTERAL NUTRITION CONSULT NOTE - Follow Up  Pharmacy Consult for TNA Indication: SBO   Allergies  Allergen Reactions  . Augmentin (Amoxicillin-Pot Clavulanate) Other (See Comments)    Reaction unknown.    Patient Measurements: Height: 6\' 2"  (188 cm) Weight: 195 lb 9.6 oz (88.724 kg) IBW/kg (Calculated) : 82.2 Adjusted Body Weight: 84.2  Vital Signs: Temp: 98.1 F (36.7 C) (05/03 0509) Temp src: Oral (05/03 0509) BP: 130/55 mmHg (05/03 0509) Pulse Rate: 55 (05/03 0509)  Intake/Output from previous day: 05/02 0701 - 05/03 0700 In: 2702.5 [I.V.:1781.7; IV Piggyback:300; TPN:620.8] Out: 2225 [Urine:1275; Emesis/NG output:950]  Labs:  Recent Labs  03/29/13 0445 03/31/13 0420  WBC 4.4 5.5  HGB 11.6* 11.4*  HCT 34.1* 32.8*  PLT 148* 168     Recent Labs  03/28/13 1503 03/29/13 0445 03/31/13 0420  NA  --  140 137  K  --  3.1* 3.6  CL  --  104 102  CO2  --  29 27  GLUCOSE  --  115* 124*  BUN  --  22 17  CREATININE  --  1.35 1.09  CALCIUM  --  8.2* 8.8  MG  --  1.9 1.8  PHOS  --   --  2.8  PROT  --   --  6.1  ALBUMIN  --   --  2.5*  AST  --   --  14  ALT  --   --  8  ALKPHOS  --   --  39  BILITOT  --   --  0.3  PREALBUMIN 13.6*  --   --    Estimated Creatinine Clearance: 62.8 ml/min (by C-G formula based on Cr of 1.09).    Recent Labs  03/30/13 1804 03/31/13 0002 03/31/13 0619  GLUCAP 93 132* 141*    Assessment:  79 yo M with h/o prostate cancer presented 4/27 with c/o L flank and LLQ abd pain. CT with high grade SBO. Surgery on board - conservative management recommended. Pt continues to be NPO since admission.  Surgery ordered for PICC and TNA to start 5/2.  Plan diet advancement when bowel function returns.   Nutritional Goals:  - RD recommendations: Kcal: 2220-2480,  Protein: 106-124 grams,  Fluid: 2.5 L - Clinimix E 5/20 @ 90 ml/hr + IVF 20% at 10 ml/hr on MWF will provide: 108 g protein and 2107 Kcal on average day  Current nutrition:   - Diet: NPO (tolerating ice chips) - TNA: Clinimix E5/20 at 43ml/hr - mIVF: D5-1/2 NS w/ KCl 40 mEq/L @ 40 ml/hr   CBGs & Insulin requirements past 24 hours:  - 2 units  Labs: Electrolytes: wnl Renal Function: Scr wnl, improving Hepatic Function: wnl Pre-Albumin: 13.6 on 4/30 Triglycerides: (in process) CBGs: controlled - no h/o DM   Plan:  At 1800 tonight  Increase Clinimix E 5/20 to 80 ml/hr   IV fat emulsion 20%, standard multivitamins and trace elements on MWF only due to ongoing shortage  Reduce IVF to kvo.  Continue sensitive SSI q6h  TNA labs Monday/Thursdays - BMET tomorrow  Pharmacy will follow up daily  Darrol Angel, PharmD Pager: [phone] 03/31/2013 9:24 AM

## 2013-03-31 NOTE — Progress Notes (Addendum)
[facility] PROGRESS NOTE  Michael Novak WJX:[id number] DOB: 17-Dec-1929 DOA: 03/25/2013 PCP: Kimber Relic, MD  HPI: 79 y.o. male has a past medical history significant for prostate cancer (urologist is Dr. Logan Bores) s/p radioactive seed implantation, complicated by prostatic rectal fistula status post cystoprostatectomy with ileal conduit as well as proctocolectomy with colostomy, as well as right-sided nephrostomy placed in July of 2013 (exchanged every 6 weeks), abdominal aortic aneurysm followed by vascular surgery as an outpatient with a stable appearance on repeat imaging, COPD, presents with a chief complaint of left flank and left lower quadrant abdominal pain that started yesterday. He denies any fever or chills. He endorses good colostomy output up until last night. He endorses nausea without vomiting. He has no chest pain, no breathing difficulties, he denies lightheadedness or dizziness. In the emergency room a CT scan of the abdomen was done which showed a high-grade small bowel obstruction in the lower lateral left pelvis without obvious obstructing cause. Surgery has been consulted, and due to multiple medical problems hospitalist service was asked for admission. Patient currently is in no acute distress, has an NG tube in place feels much more comfortable than when he came in. He denies any complaints and states that his abdominal pain is better in the ED.   Assessment/Plan:  Small bowel obstruction  - Surgery was consulted by the ED physician on admission, appreciate their input.  - Conservative management for now, patient to remain n.p.o., NG tube in place  - D5 half-normal as maintenance fluids  - phenergan for nausea, pain control - Continue current IV fluids, cr improving 1.37 today (from 1.52 on 4/29) -Followup abdominal x-ray 4/30 with improving bowel gas pattern -Patient still with empty colostomy bag -continue TPN today  &continuing NG follow. -appreciate surgery  assistance  AAA  - stable per imaging   Bradycardia/1st degree heart block - 2 sec pause on monitor overnight 4/28, asymptomatic, in sinus rhythm - cardiology saw him 4/28, no interventions.   Ureteral stricture  - nephrostomy in place, renal function close to baseline (~1.2)  - monitor UOP   COPD  - continue Inhalers when necessary   Hyperlipidemia  - hold oral meds while strict NPO.   Cholelithiasis - asymptomatic  Hydronephrosis - s/p stenting in 2013, good UOP Hypokalemia -Resolved  Monoarthritis, R middle distal DIP With elevated uric acid level of 8.6, and even though per x-ray no definite osseus erosion charateristic of gout observed, this could still be gout, he has a h/o of gout (as per review of his medical records-06/17/11 d/c) -will empirically treat with NSAID/toradol- pt still npo  DVT prophylaxis  - heparin s.q. Since no planned surgery    Code Status: DNR Family Communication: no family at bedside Disposition Plan: Pending clinical course  Consultants:  Surgery  Cardiolgogy  Procedures:  none  Antibiotics:  Anti-infectives   None     Antibiotics Given (last 72 hours)   None      Past antibiotics none  HPI/Subjective: -denies pain,No flatus, colostomy bag remains empty.states still with some pain distally in right middle finger.  Objective: Filed Vitals:   03/30/13 1300 03/30/13 2107 03/30/13 2225 03/31/13 0509  BP: 125/65  126/78 130/55  Pulse: 77  78 55  Temp: 98.9 F (37.2 C)  98.6 F (37 C) 98.1 F (36.7 C)  TempSrc: Oral  Oral Oral  Resp: 17  18 18   Height:      Weight:      SpO2: 99%  92% 98% 98%    Intake/Output Summary (Last 24 hours) at 03/31/13 0933 Last data filed at 03/31/13 0700  Gross per 24 hour  Intake 2702.5 ml  Output   2225 ml  Net  477.5 ml   Filed Weights   03/25/13 1556  Weight: 88.724 kg (195 lb 9.6 oz)    Exam:   General:  NAD  HEENT-NG tube in place  Cardiovascular: regular rate  and rhythm, bradycardic  Respiratory: good air movement, clear to auscultation throughout, no wheezing, ronchi or rales  Abdomen: soft, mildly tender to palpation; colostomy-empty and urostomy in place.   Extremities: R. Middle finger distal DIP with tenderness and mild swelling.  Neuro: CN 2-12 grossly intact, MS 5/5 in all 4  Data Reviewed: Basic Metabolic Panel:  Recent Labs Lab 03/26/13 1100 03/27/13 0435 03/28/13 0443 03/29/13 0445 03/31/13 0420  NA 136 136 139 140 137  K 4.5 4.0 3.6 3.1* 3.6  CL 100 96 102 104 102  CO2 29 33* 29 29 27   GLUCOSE 129* 124* 116* 115* 124*  BUN 22 22 24* 22 17  CREATININE 1.41* 1.52* 1.37* 1.35 1.09  CALCIUM 9.1 9.2 8.5 8.2* 8.8  MG  --   --   --  1.9 1.8  PHOS  --   --   --   --  2.8   CBC:  Recent Labs Lab 03/25/13 1002 03/27/13 0435 03/28/13 0443 03/29/13 0445 03/31/13 0420  WBC 5.3 4.3 4.2 4.4 5.5  NEUTROABS 3.7  --   --   --  3.2  HGB 15.0 14.6 12.7* 11.6* 11.4*  HCT 42.8 42.7 38.4* 34.1* 32.8*  MCV 89.0 90.5 90.8 90.7 89.4  PLT 164 167 147* 148* 168   Studies: Dg Hand 2 View Right  03/29/2013  *RADIOLOGY REPORT*  Clinical Data: Pain in the middle finger.  RIGHT HAND - 2 VIEW  Comparison: None.  Findings: Severe degenerative arthropathy of the first carpometacarpal articulation noted with lateral subluxation of the base of the first metacarpal and prominent irregular spur formation.  Spurring noted in the first MCP joint and interphalangeal joint of the thumb.  Proximal interphalangeal joint spurring also noted in the index, middle, and ring fingers.  No specific marginal erosions of gout observed.  There does appears that to be subcutaneous edema in the wrist and hand, particularly affecting the middle finger.  No underlying fracture noted.  IMPRESSION:  1.  Soft tissue swelling in the wrist and hand, especially involving the middle finger.  No definite osseous erosion characteristic of gout observed. 2.  Severe degenerative  findings at the first carpometacarpal articulation, with scattered degenerative spurring elsewhere in the hand.   Original Report Authenticated By: Gaylyn Rong, M.D.     Scheduled Meds: . antiseptic oral rinse  15 mL Mouth Rinse BID  . heparin subcutaneous  5,000 Units Subcutaneous Q8H  . insulin aspart  0-9 Units Subcutaneous Q6H  . tiotropium  18 mcg Inhalation QHS   Continuous Infusions: . dextrose 5 % and 0.45 % NaCl with KCl 40 mEq/L 40 mL/hr at 03/30/13 1701  . dextrose 5 % and 0.45 % NaCl with KCl 40 mEq/L    . Marland KitchenTPN (CLINIMIX-E) Adult 40 mL/hr at 03/30/13 1835   And  . fat emulsion 240 mL (03/30/13 1835)  . 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 If 7 PM - 7 AM, please contact night-coverage at [email or web address], password [facility] 03/31/2013, 9:33 AM  LOS: 6 days

## 2013-03-31 NOTE — Progress Notes (Signed)
Abd xray showed NGT was not in stomach. NGT advanced and placement checked by auscultation. Julio Sicks RN

## 2013-03-31 NOTE — Progress Notes (Signed)
Subjective: Pthaving some nausea, abdominal pain improved.  Quite a bit of air in ostomy bag today.  Ambulating OOB through halls.   Objective: Vital signs in last 24 hours: Temp:  [98.1 F (36.7 C)-98.9 F (37.2 C)] 98.1 F (36.7 C) (05/03 0509) Pulse Rate:  [55-78] 55 (05/03 0509) Resp:  [17-18] 18 (05/03 0509) BP: (125-130)/(55-78) 130/55 mmHg (05/03 0509) SpO2:  [92 %-99 %] 98 % (05/03 0509) Last BM Date: 03/26/13  Intake/Output from previous day: 05/02 0701 - 05/03 0700 In: 2702.5 [I.V.:1781.7; IV Piggyback:300; TPN:620.8] Out: 2225 [Urine:1275; Emesis/NG output:950] Intake/Output this shift:    PE: Gen:  Alert, NAD, pleasant Abd: Soft, NT/ND, gas in bag, no stool  Lab Results:   Recent Labs  03/29/13 0445 03/31/13 0420  WBC 4.4 5.5  HGB 11.6* 11.4*  HCT 34.1* 32.8*  PLT 148* 168   BMET  Recent Labs  03/29/13 0445 03/31/13 0420  NA 140 137  K 3.1* 3.6  CL 104 102  CO2 29 27  GLUCOSE 115* 124*  BUN 22 17  CREATININE 1.35 1.09  CALCIUM 8.2* 8.8   PT/INR No results found for this basename: LABPROT, INR,  in the last 72 hours CMP     Component Value Date/Time   NA 137 03/31/2013 0420   K 3.6 03/31/2013 0420   CL 102 03/31/2013 0420   CO2 27 03/31/2013 0420   GLUCOSE 124* 03/31/2013 0420   BUN 17 03/31/2013 0420   CREATININE 1.09 03/31/2013 0420   CALCIUM 8.8 03/31/2013 0420   PROT 6.1 03/31/2013 0420   ALBUMIN 2.5* 03/31/2013 0420   AST 14 03/31/2013 0420   ALT 8 03/31/2013 0420   ALKPHOS 39 03/31/2013 0420   BILITOT 0.3 03/31/2013 0420   GFRNONAA 62* 03/31/2013 0420   GFRAA 72* 03/31/2013 0420   Lipase     Component Value Date/Time   LIPASE 23 06/21/2012 1630       Studies/Results: Dg Hand 2 View Right  03/29/2013  *RADIOLOGY REPORT*  Clinical Data: Pain in the middle finger.  RIGHT HAND - 2 VIEW  Comparison: None.  Findings: Severe degenerative arthropathy of the first carpometacarpal articulation noted with lateral subluxation of the base of the first  metacarpal and prominent irregular spur formation.  Spurring noted in the first MCP joint and interphalangeal joint of the thumb.  Proximal interphalangeal joint spurring also noted in the index, middle, and ring fingers.  No specific marginal erosions of gout observed.  There does appears that to be subcutaneous edema in the wrist and hand, particularly affecting the middle finger.  No underlying fracture noted.  IMPRESSION:  1.  Soft tissue swelling in the wrist and hand, especially involving the middle finger.  No definite osseous erosion characteristic of gout observed. 2.  Severe degenerative findings at the first carpometacarpal articulation, with scattered degenerative spurring elsewhere in the hand.   Original Report Authenticated By: Gaylyn Rong, M.D.     Anti-infectives: Anti-infectives   None       Assessment/Plan SBO, prior Exploratory lap for SBO and lysis of adhesions. 2010  Hx of Recurrent Prostate CA with radiation seeds  Rectourethral fistula with cystectomy, ileal conduit and permanent end colostomy, 2002  Parastomal colostomy hernia  Repair of parastomal Hernia 2009  Chronic right ureteral stricture with stnets by IR  COPD  Depression  AAA Malnutrition  Volume overload?  Edematous hands and feet   Plan:  1.  Better today, cont NPO, Will cont TPN and PICC for  malnutrition (PCM) 2.  Cont NG- hopefully this can come out soon 3.  AXR has a nonspecific gas pattern appearance to me 4.  Ambulate and IS 5.  SCD's and heparin     LOS: 6 days    Vanita Panda 03/31/2013, 10:32 AM Pager: [phone]

## 2013-04-01 DIAGNOSIS — E876 Hypokalemia: Secondary | ICD-10-CM

## 2013-04-01 LAB — GLUCOSE, CAPILLARY
Glucose-Capillary: 140 mg/dL — ABNORMAL HIGH (ref 70–99)
Glucose-Capillary: 147 mg/dL — ABNORMAL HIGH (ref 70–99)

## 2013-04-01 LAB — BASIC METABOLIC PANEL
BUN: 19 mg/dL (ref 6–23)
Calcium: 8.8 mg/dL (ref 8.4–10.5)
GFR calc non Af Amer: 66 mL/min — ABNORMAL LOW (ref >90)
Glucose, Bld: 136 mg/dL — ABNORMAL HIGH (ref 70–99)

## 2013-04-01 MED ORDER — POTASSIUM CHLORIDE 10 MEQ/100ML IV SOLN
10.0000 meq | INTRAVENOUS | Status: AC
  Administered 2013-04-01 (×3): 10 meq via INTRAVENOUS
  Filled 2013-04-01 (×4): qty 100

## 2013-04-01 MED ORDER — CLINIMIX E/DEXTROSE (5/20) 5 % IV SOLN
INTRAVENOUS | Status: AC
  Administered 2013-04-01: 18:00:00 via INTRAVENOUS
  Filled 2013-04-01: qty 2160

## 2013-04-01 NOTE — Plan of Care (Signed)
Problem: Phase III Progression Outcomes Goal: Voiding independently Outcome: Completed/Met Date Met:  04/01/13 Has ileal conduit & Nephrostomy tube

## 2013-04-01 NOTE — Progress Notes (Signed)
[facility] PROGRESS NOTE  Michael KUHNER Novak:[id number] DOB: 1930/11/14 DOA: 03/25/2013 PCP: Kimber Relic, MD  HPI: 79 y.o. male has a past medical history significant for prostate cancer (urologist is Dr. Logan Bores) s/p radioactive seed implantation, complicated by prostatic rectal fistula status post cystoprostatectomy with ileal conduit as well as proctocolectomy with colostomy, as well as right-sided nephrostomy placed in July of 2013 (exchanged every 6 weeks), abdominal aortic aneurysm followed by vascular surgery as an outpatient with a stable appearance on repeat imaging, COPD, presents with a chief complaint of left flank and left lower quadrant abdominal pain that started yesterday. He denies any fever or chills. He endorses good colostomy output up until last night. He endorses nausea without vomiting. He has no chest pain, no breathing difficulties, he denies lightheadedness or dizziness. In the emergency room a CT scan of the abdomen was done which showed a high-grade small bowel obstruction in the lower lateral left pelvis without obvious obstructing cause. Surgery has been consulted, and due to multiple medical problems hospitalist service was asked for admission. Patient currently is in no acute distress, has an NG tube in place feels much more comfortable than when he came in. He denies any complaints and states that his abdominal pain is better in the ED.   Assessment/Plan:  Small bowel obstruction  - Surgery was consulted by the ED physician on admission, appreciate their input.  - Conservative management for now, patient to remain n.p.o., NG tube in place  - D5 half-normal as maintenance fluids  - phenergan for nausea, pain control - Continue current IV fluids, cr improving 1.37 today (from 1.52 on 4/29) -Followup abdominal x-ray 4/30 with improving bowel gas pattern -Patient still with empty colostomy bag -continue TPN today  &continuing NG follow. -appreciate surgery  assistance  AAA  - stable per imaging   Bradycardia/1st degree heart block - 2 sec pause on monitor overnight 4/28, asymptomatic, in sinus rhythm - cardiology saw him 4/28, no interventions.   Ureteral stricture  - nephrostomy in place, renal function close to baseline (~1.2)  - monitor UOP   COPD  - continue Inhalers when necessary   Hyperlipidemia  - hold oral meds while strict NPO.   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 Since no planned surgery    Code Status: DNR Family Communication: no family at bedside Disposition Plan: Pending clinical course  Consultants:  Surgery  Cardiolgogy  Procedures:  none  Antibiotics:  Anti-infectives   None     Antibiotics Given (last 72 hours)   None      Past antibiotics none  HPI/Subjective: -denies abd pain,  Reports small flatus today, colostomy bag remains empty.states states  right middle finger feelsmuch better today.  Objective: Filed Vitals:   03/31/13 1406 03/31/13 2007 03/31/13 2050 04/01/13 0600  BP: 130/33  134/68 113/63  Pulse: 50  62 56  Temp: 98.2 F (36.8 C)  98.9 F (37.2 C) 98.7 F (37.1 C)  TempSrc: Oral  Oral Oral  Resp: 18  18 18   Height:      Weight:  SpO2: 97% 94% 92% 92%    Intake/Output Summary (Last 24 hours) at 04/01/13 4098 Last data filed at 04/01/13 1191  Gross per 24 hour  Intake 2262.33 ml  Output   2975 ml  Net -712.67 ml   Filed Weights   03/25/13 1556  Weight: 88.724 kg (195 lb 9.6 oz)    Exam:   General:  NAD  HEENT-NG tube in  place  Cardiovascular: regular rate and rhythm, bradycardic  Respiratory: good air movement, clear to auscultation throughout, no wheezing, ronchi or rales  Abdomen: soft, mildly tender to palpation; colostomy-empty and urostomy in place.   Extremities: R. Middle finger distal DIP with decreased tenderness and swelling.  Neuro: CN 2-12 grossly intact, MS 5/5 in all 4  Data Reviewed: Basic Metabolic Panel:  Recent Labs Lab 03/27/13 0435 03/28/13 0443 03/29/13 0445 03/31/13 0420 04/01/13 0335  NA 136 139 140 137 136  K 4.0 3.6 3.1* 3.6 3.3*  CL 96 102 104 102 100  CO2 33* 29 29 27 28   GLUCOSE 124* 116* 115* 124* 136*  BUN 22 24* 22 17 19   CREATININE 1.52* 1.37* 1.35 1.09 1.04  CALCIUM 9.2 8.5 8.2* 8.8 8.8  MG  --   --  1.9 1.8  --   PHOS  --   --   --  2.8  --    CBC:  Recent Labs Lab 03/25/13 1002 03/27/13 0435 03/28/13 0443 03/29/13 0445 03/31/13 0420  WBC 5.3 4.3 4.2 4.4 5.5  NEUTROABS 3.7  --   --   --  3.2  HGB 15.0 14.6 12.7* 11.6* 11.4*  HCT 42.8 42.7 38.4* 34.1* 32.8*  MCV 89.0 90.5 90.8 90.7 89.4  PLT 164 167 147* 148* 168   Studies: Dg Abd 1 View  03/31/2013  *RADIOLOGY REPORT*  Clinical Data: Nausea.  Dilate for small bowel obstruction.  ABDOMEN - 1 VIEW  Comparison: 03/28/2013  Findings: 1009 hours.  Upright abdomen was performed portably. There is no definite intraperitoneal free air visible on this film. Due to technical factors related to performing an upright abdomen portably, only the uppermost portion of the abdomen is included on the film.  There are gas distended loops of large and small bowel visible.  The patient's NG tube is looped back upon itself in the distal esophagus.  IMPRESSION: NG tube loops upon itself in the distal esophagus and does not enter the stomach.  Diffuse gaseous distention of large and small bowel in the upper abdomen.   Original Report Authenticated By: Kennith Center, M.D.     Scheduled Meds: . antiseptic oral rinse  15  mL Mouth Rinse BID  . heparin subcutaneous  5,000 Units Subcutaneous Q8H  . insulin aspart  0-9 Units Subcutaneous Q6H  . potassium chloride  10 mEq Intravenous Q1 Hr x 3  . tiotropium  18 mcg Inhalation QHS   Continuous Infusions: . dextrose 5 % and 0.45 % NaCl with KCl 40 mEq/L 20 mL/hr at 03/31/13 1800  . Marland KitchenTPN (CLINIMIX-E) Adult 80 mL/hr at 03/31/13 1719    Principal Problem:   SBO (small bowel obstruction) Active Problems:   Prostate cancer   Parastomal hernia at left colostomy   COPD (chronic obstructive pulmonary disease)   AAA (abdominal aortic aneurysm)   H/O colostomy   Rectourethral fistula   Ureteral stricture, right with chronic nephroureteral stenting    Hernia of pelvic floor   Time spent: 25  Donnalee Curry, MD [facility] Pager [phone]. If 7 PM -  7 AM, please contact night-coverage at [email or web address], password [facility] 04/01/2013, 9:18 AM  LOS: 7 days

## 2013-04-01 NOTE — Progress Notes (Signed)
Subjective: less nausea, appears tube was advanced.  abdominal pain improved.  Quite a bit of air in ostomy bag again today.  Ambulating OOB through halls.   Objective: Vital signs in last 24 hours: Temp:  [98.2 F (36.8 C)-98.9 F (37.2 C)] 98.7 F (37.1 C) (05/04 0600) Pulse Rate:  [50-62] 56 (05/04 0600) Resp:  [18] 18 (05/04 0600) BP: (113-134)/(33-68) 113/63 mmHg (05/04 0600) SpO2:  [92 %-97 %] 92 % (05/04 0600) Last BM Date: 03/26/13  Intake/Output from previous day: 05/03 0701 - 05/04 0700 In: 2262.3 [I.V.:695; ZOX:0960.4] Out: 2975 [Urine:1700; Emesis/NG output:1275] Intake/Output this shift:    PE: Gen:  Alert, NAD, pleasant Abd: Soft, NT/ND, gas/mucus in bag, no stool  Lab Results:   Recent Labs  03/31/13 0420  WBC 5.5  HGB 11.4*  HCT 32.8*  PLT 168   BMET  Recent Labs  03/31/13 0420 04/01/13 0335  NA 137 136  K 3.6 3.3*  CL 102 100  CO2 27 28  GLUCOSE 124* 136*  BUN 17 19  CREATININE 1.09 1.04  CALCIUM 8.8 8.8   PT/INR No results found for this basename: LABPROT, INR,  in the last 72 hours CMP     Component Value Date/Time   NA 136 04/01/2013 0335   K 3.3* 04/01/2013 0335   CL 100 04/01/2013 0335   CO2 28 04/01/2013 0335   GLUCOSE 136* 04/01/2013 0335   BUN 19 04/01/2013 0335   CREATININE 1.04 04/01/2013 0335   CALCIUM 8.8 04/01/2013 0335   PROT 6.1 03/31/2013 0420   ALBUMIN 2.5* 03/31/2013 0420   AST 14 03/31/2013 0420   ALT 8 03/31/2013 0420   ALKPHOS 39 03/31/2013 0420   BILITOT 0.3 03/31/2013 0420   GFRNONAA 66* 04/01/2013 0335   GFRAA 76* 04/01/2013 0335   Lipase     Component Value Date/Time   LIPASE 23 06/21/2012 1630       Studies/Results: Dg Abd 1 View  03/31/2013  *RADIOLOGY REPORT*  Clinical Data: Nausea.  Dilate for small bowel obstruction.  ABDOMEN - 1 VIEW  Comparison: 03/28/2013  Findings: 1009 hours.  Upright abdomen was performed portably. There is no definite intraperitoneal free air visible on this film. Due to technical factors  related to performing an upright abdomen portably, only the uppermost portion of the abdomen is included on the film.  There are gas distended loops of large and small bowel visible.  The patient's NG tube is looped back upon itself in the distal esophagus.  IMPRESSION: NG tube loops upon itself in the distal esophagus and does not enter the stomach.  Diffuse gaseous distention of large and small bowel in the upper abdomen.   Original Report Authenticated By: Kennith Center, M.D.     Anti-infectives: Anti-infectives   None       Assessment/Plan SBO, prior Exploratory lap for SBO and lysis of adhesions. 2010  Hx of Recurrent Prostate CA with radiation seeds  Rectourethral fistula with cystectomy, ileal conduit and permanent end colostomy, 2002  Parastomal colostomy hernia  Repair of parastomal Hernia 2009  Chronic right ureteral stricture with stnets by IR  COPD  Depression  AAA Malnutrition  Volume overload?  Edematous hands and feet   Plan:  1.  Better today, cont NPO, Will cont TPN and PICC for malnutrition (PCM) 2.  Cont NG- hopefully this can come out soon, output is non bilious  3.  Ambulate and IS 4.  SCD's and heparin     LOS: 7 days  Donora, Madelein Mahadeo C. 04/01/2013, 9:30 AM Pager: [phone]

## 2013-04-01 NOTE — Progress Notes (Signed)
[facility] NUTRITION CONSULT NOTE - Follow Up  Pharmacy Consult for TNA Indication: SBO   Allergies  Allergen Reactions  . Augmentin (Amoxicillin-Pot Clavulanate) Other (See Comments)    Reaction unknown.    Patient Measurements: Height: 6\' 2"  (188 cm) Weight: 195 lb 9.6 oz (88.724 kg) IBW/kg (Calculated) : 82.2 Adjusted Body Weight: 84.2  Vital Signs: Temp: 98.7 F (37.1 C) (05/04 0600) Temp src: Oral (05/04 0600) BP: 113/63 mmHg (05/04 0600) Pulse Rate: 56 (05/04 0600)  Intake/Output from previous day: 05/03 0701 - 05/04 0700 In: 2262.3 [I.V.:695; WUJ:8119.1] Out: 2975 [Urine:1700; Emesis/NG output:1275]  Labs:  Recent Labs  03/31/13 0420  WBC 5.5  HGB 11.4*  HCT 32.8*  PLT 168     Recent Labs  03/31/13 0420 04/01/13 0335  NA 137 136  K 3.6 3.3*  CL 102 100  CO2 27 28  GLUCOSE 124* 136*  BUN 17 19  CREATININE 1.09 1.04  CALCIUM 8.8 8.8  MG 1.8  --   PHOS 2.8  --   PROT 6.1  --   ALBUMIN 2.5*  --   AST 14  --   ALT 8  --   ALKPHOS 39  --   BILITOT 0.3  --   PREALBUMIN 10.4*  --   TRIG 105  --   CHOL 128  --    Estimated Creatinine Clearance: 65.9 ml/min (by C-G formula based on Cr of 1.04).    Recent Labs  03/31/13 1838 04/01/13 04/01/13 0559  GLUCAP 134* 146* 139*    Nutritional Goals:  - RD recommendations: Kcal: 2220-2480,  Protein: 106-124 grams,  Fluid: 2.5 L - Clinimix E 5/20 @ 90 ml/hr + IVF 20% at 10 ml/hr on MWF will provide: 108 g protein and 2107 Kcal on average day  Current nutrition:  - Diet: NPO (tolerating ice chips) - TNA: Clinimix E5/20 at 60ml/hr - mIVF: D5-1/2 NS w/ KCl 40 mEq/L @ kvo  CBGs & Insulin requirements past 24 hours:  - 4 units  Assessment:  79 yo M with h/o prostate cancer presented 4/27 with c/o L flank and LLQ abd pain. CT with high grade SBO. Surgery on board - conservative management recommended. Pt continues to be NPO since admission.  Surgery ordered for PICC and TNA started 5/2.  Plan diet  advancement when bowel function returns.   Surgery note (5/3 and 5/4) indicates patient's hands and feet are edematous. Discussed with Dr. Maisie Fus, does not want the TNA rate decreased at this time. Ok to advance to goal rate.   Labs: Electrolytes: K=3.3 (MD has ordered KCl IV x3) Renal Function: Scr wnl, improving Hepatic Function: wnl Pre-Albumin: 13.6 on 4/30,  10.4 (5/3) Triglycerides: wnl (5/3) CBGs: <150 - no h/o DM   Plan:  At 1800 tonight  Increase Clinimix E 5/20 to 90 ml/hr (goal rate)  IV fat emulsion 20%, standard multivitamins and trace elements on MWF only due to ongoing shortage  Continue sensitive SSI q6h  TNA labs Monday/Thursdays  F/U edematous hands/feet vs TNA fluid tomorrow  Pharmacy will follow up daily  Darrol Angel, PharmD Pager: [phone] 04/01/2013 9:49 AM

## 2013-04-02 ENCOUNTER — Inpatient Hospital Stay (HOSPITAL_COMMUNITY): Payer: Medicare Other

## 2013-04-02 DIAGNOSIS — R509 Fever, unspecified: Secondary | ICD-10-CM

## 2013-04-02 LAB — CHOLESTEROL, TOTAL: Cholesterol: 122 mg/dL (ref 0–200)

## 2013-04-02 LAB — COMPREHENSIVE METABOLIC PANEL
ALT: 17 U/L (ref 0–53)
AST: 22 U/L (ref 0–37)
Albumin: 2.6 g/dL — ABNORMAL LOW (ref 3.5–5.2)
Alkaline Phosphatase: 39 U/L (ref 39–117)
Potassium: 3.5 mEq/L (ref 3.5–5.1)
Sodium: 135 mEq/L (ref 135–145)
Total Protein: 6.3 g/dL (ref 6.0–8.3)

## 2013-04-02 LAB — DIFFERENTIAL
Basophils Absolute: 0 10*3/uL (ref 0.0–0.1)
Eosinophils Absolute: 0.1 10*3/uL (ref 0.0–0.7)
Eosinophils Relative: 1 % (ref 0–5)

## 2013-04-02 LAB — CBC
MCH: 29.6 pg (ref 26.0–34.0)
MCHC: 33.4 g/dL (ref 30.0–36.0)
MCV: 88.7 fL (ref 78.0–100.0)
Platelets: 195 10*3/uL (ref 150–400)
RDW: 13.5 % (ref 11.5–15.5)

## 2013-04-02 LAB — GLUCOSE, CAPILLARY
Glucose-Capillary: 141 mg/dL — ABNORMAL HIGH (ref 70–99)
Glucose-Capillary: 114 mg/dL — ABNORMAL HIGH (ref 70–99)

## 2013-04-02 LAB — MAGNESIUM: Magnesium: 1.9 mg/dL (ref 1.5–2.5)

## 2013-04-02 MED ORDER — FAT EMULSION 20 % IV EMUL
250.0000 mL | INTRAVENOUS | Status: AC
  Administered 2013-04-02: 250 mL via INTRAVENOUS
  Filled 2013-04-02: qty 250

## 2013-04-02 MED ORDER — TRACE MINERALS CR-CU-F-FE-I-MN-MO-SE-ZN IV SOLN
INTRAVENOUS | Status: AC
  Administered 2013-04-02: 18:00:00 via INTRAVENOUS
  Filled 2013-04-02: qty 2200

## 2013-04-02 MED ORDER — FAT EMULSION 20 % IV EMUL: 250.0000 mL | INTRAVENOUS | Status: DC

## 2013-04-02 MED ORDER — M.V.I. ADULT IV INJ: INTRAVENOUS | Status: DC

## 2013-04-02 NOTE — Progress Notes (Signed)
PARENTERAL NUTRITION CONSULT NOTE - Follow Up  Pharmacy Consult for TNA Indication: SBO   Allergies  Allergen Reactions  . Augmentin (Amoxicillin-Pot Clavulanate) Other (See Comments)    Reaction unknown.    Patient Measurements: Height: 6\' 2"  (188 cm) Weight: 198 lb 3.2 oz (89.903 kg) IBW/kg (Calculated) : 82.2 Adjusted Body Weight: 84.2  Vital Signs: Temp: 100.4 F (38 C) (05/05 0559) Temp src: Oral (05/05 0559) BP: 138/63 mmHg (05/05 0559) Pulse Rate: 56 (05/05 0559)  Intake/Output from previous day: 05/04 0701 - 05/05 0700 In: 2718 [I.V.:404.3; NG/GT:30; IV Piggyback:300; TPN:1983.7] Out: 3415 [Urine:2050; Emesis/NG output:1325; Stool:40]  Labs:  Recent Labs  03/31/13 0420 04/02/13 0320  WBC 5.5 5.5  HGB 11.4* 11.8*  HCT 32.8* 35.3*  PLT 168 195     Recent Labs  03/31/13 0420 04/01/13 0335 04/02/13 0320  NA 137 136 135  K 3.6 3.3* 3.5  CL 102 100 99  CO2 27 28 29   GLUCOSE 124* 136* 130*  BUN 17 19 22   CREATININE 1.09 1.04 1.02  CALCIUM 8.8 8.8 8.8  MG 1.8  --  1.9  PHOS 2.8  --  3.5  PROT 6.1  --  6.3  ALBUMIN 2.5*  --  2.6*  AST 14  --  22  ALT 8  --  17  ALKPHOS 39  --  39  BILITOT 0.3  --  0.4  PREALBUMIN 10.4*  --   --   TRIG 105  --   --   CHOL 128  --   --    Estimated Creatinine Clearance: 67.2 ml/min (by C-G formula based on Cr of 1.02).    Recent Labs  04/01/13 1747 04/01/13 2348 04/02/13 0605  GLUCAP 145* 140* 147*    Nutritional Goals:  - RD recommendations: Kcal: 2220-2480,  Protein: 106-124 grams,  Fluid: 2.5 L - Clinimix E 5/20 @ 90 ml/hr + IVF 20% at 10 ml/hr on MWF will provide: 108 g protein and 2107 Kcal on average day  Current nutrition:  - Diet: NPO (tolerating ice chips) - TNA: Clinimix E5/20 at 80ml/hr - mIVF: D5-1/2 NS w/ KCl 40 mEq/L @ kvo  CBGs & Insulin requirements past 24 hours:  - 4 units SSI/24h  Assessment:  79 yo M with h/o prostate cancer presented 4/27 with c/o L flank and LLQ abd pain. CT  with high grade SBO. Surgery on board - conservative management recommended. Pt continues to be NPO since admission.  Surgery ordered for PICC and TNA started 5/2.  Plan diet advancement when bowel function returns.   Surgery note (5/3 and 5/4) indicates patient's hands and feet are edematous. On 5/5 swelling has gone down and patient states not an issue this morning.   Labs: Electrolytes: K=3.5 (replaced 5/4) Renal Function: Scr wnl, improving Hepatic Function: wnl Pre-Albumin: 13.6 on 4/30,  10.4 (5/3) Triglycerides: wnl (5/3), pending (5/5) CBGs: <150 - no h/o DM, pending (5/5)   Plan:  At 1800 tonight  Continue Clinimix E 5/20 to 90 ml/hr (goal rate)  IV fat emulsion 20%, standard multivitamins and trace elements on MWF only due to ongoing shortage  Continue sensitive SSI q6h  TNA labs Monday/Thursdays  Pharmacy will follow up daily  Juliette Alcide, PharmD, BCPS.   Pager: [phone] 04/02/2013 7:27 AM

## 2013-04-02 NOTE — Progress Notes (Signed)
[facility] PROGRESS NOTE  Michael Novak:[id number] DOB: January 02, 1930 DOA: 03/25/2013 PCP: Kimber Relic, MD  HPI: 79 y.o. male has a past medical history significant for prostate cancer (urologist is Dr. Logan Bores) s/p radioactive seed implantation, complicated by prostatic rectal fistula status post cystoprostatectomy with ileal conduit as well as proctocolectomy with colostomy, as well as right-sided nephrostomy placed in July of 2013 (exchanged every 6 weeks), abdominal aortic aneurysm followed by vascular surgery as an outpatient with a stable appearance on repeat imaging, COPD, presents with a chief complaint of left flank and left lower quadrant abdominal pain that started yesterday. He denies any fever or chills. He endorses good colostomy output up until last night. He endorses nausea without vomiting. He has no chest pain, no breathing difficulties, he denies lightheadedness or dizziness. In the emergency room a CT scan of the abdomen was done which showed a high-grade small bowel obstruction in the lower lateral left pelvis without obvious obstructing cause. Surgery has been consulted, and due to multiple medical problems hospitalist service was asked for admission. Patient currently is in no acute distress, has an NG tube in place feels much more comfortable than when he came in. He denies any complaints and states that his abdominal pain is better in the ED.   Assessment/Plan:  Small bowel obstruction  - Surgery was consulted by the ED physician on admission, appreciate their input.  - Conservative management for now, patient to remain n.p.o., NG tube in place  - D5 half-normal as maintenance fluids  - phenergan for nausea, pain control - Continue current IV fluids, cr improving 1.37 today (from 1.52 on 4/29) -Followup abdominal x-ray 4/30 with improving bowel gas pattern -Patient still with empty colostomy bag -continue TPN today  &continuing NG follow. -appreciate surgery  assistance  AAA  - stable per imaging   Bradycardia/1st degree heart block - 2 sec pause on monitor overnight 4/28, asymptomatic, in sinus rhythm - cardiology saw him 4/28, no interventions.   Ureteral stricture  - nephrostomy in place, renal function close to baseline (~1.2)  - monitor UOP   COPD  - continue Inhalers when necessary   Hyperlipidemia  - hold oral meds while strict NPO.   Cholelithiasis - asymptomatic  Hydronephrosis - s/p stenting in 2013, good UOP Hypokalemia -Replace  Monoarthritis, R middle distal DIP With elevated uric acid level of 8.6, and even though per x-ray no definite osseus erosion charateristic of gout observed, this could still be gout, he has a h/o of gout (as per review of his medical records-06/17/11 d/c) -continue prn NSAID/toradol- pt still npo -improving clinically  Fever -Obtain chest x-ray, and follow. Urine culture this hospitalization with no growth  DVT prophylaxis  - heparin s.q. Since no planned surgery    Code Status: DNR Family Communication: no family at bedside Disposition Plan: Pending clinical course  Consultants:  Surgery  Cardiolgogy  Procedures:  none  Antibiotics:  Anti-infectives   None     Antibiotics Given (last 72 hours)   None      Past antibiotics none  HPI/Subjective: Admits to cough today with increased phlegm. +flatus Objective: Filed Vitals:   04/01/13 1350 04/01/13 1806 04/01/13 2059 04/02/13 0559  BP: 120/67  135/65 138/63  Pulse: 60  56 56  Temp: 98.1 F (36.7 C)  99.2 F (37.3 C) 100.4 F (38 C)  TempSrc: Oral  Oral Oral  Resp: 20  18 16   Height:      Weight:  89.903  kg (198 lb 3.2 oz)    SpO2: 93%  92% 93%    Intake/Output Summary (Last 24 hours) at 04/02/13 1236 Last data filed at 04/02/13 0600  Gross per 24 hour  Intake   2718 ml  Output   3415 ml  Net   -697 ml   Filed Weights   03/25/13 1556 04/01/13 1806  Weight: 88.724 kg (195 lb 9.6 oz) 89.903 kg  (198 lb 3.2 oz)    Exam:   General:  NAD  HEENT-NG tube in place  Cardiovascular: regular rate and rhythm, bradycardic  Respiratory: good air movement, clear to auscultation throughout, no wheezing, ronchi or rales  Abdomen: soft, mildly tender to palpation; colostomy-empty and urostomy in place.   Extremities: R. Middle finger distal DIP with decreased tenderness and swelling.  Neuro: CN 2-12 grossly intact, MS 5/5 in all 4  Data Reviewed: Basic Metabolic Panel:  Recent Labs Lab 03/28/13 0443 03/29/13 0445 03/31/13 0420 04/01/13 0335 04/02/13 0320  NA 139 140 137 136 135  K 3.6 3.1* 3.6 3.3* 3.5  CL 102 104 102 100 99  CO2 29 29 27 28 29   GLUCOSE 116* 115* 124* 136* 130*  BUN 24* 22 17 19 22   CREATININE 1.37* 1.35 1.09 1.04 1.02  CALCIUM 8.5 8.2* 8.8 8.8 8.8  MG  --  1.9 1.8  --  1.9  PHOS  --   --  2.8  --  3.5   CBC:  Recent Labs Lab 03/27/13 0435 03/28/13 0443 03/29/13 0445 03/31/13 0420 04/02/13 0320  WBC 4.3 4.2 4.4 5.5 5.5  NEUTROABS  --   --   --  3.2 3.1  HGB 14.6 12.7* 11.6* 11.4* 11.8*  HCT 42.7 38.4* 34.1* 32.8* 35.3*  MCV 90.5 90.8 90.7 89.4 88.7  PLT 167 147* 148* 168 195   Studies: No results found.  Scheduled Meds: . heparin subcutaneous  5,000 Units Subcutaneous Q8H  . insulin aspart  0-9 Units Subcutaneous Q6H  . tiotropium  18 mcg Inhalation QHS   Continuous Infusions: . dextrose 5 % and 0.45 % NaCl with KCl 40 mEq/L 20 mL/hr at 04/02/13 0032  . Marland KitchenTPN (CLINIMIX-E) Adult     And  . fat emulsion    . Marland KitchenTPN (CLINIMIX-E) Adult 90 mL/hr at 04/01/13 1738    Principal Problem:   SBO (small bowel obstruction) Active Problems:   Prostate cancer   Parastomal hernia at left colostomy   COPD (chronic obstructive pulmonary disease)   AAA (abdominal aortic aneurysm)   H/O colostomy   Rectourethral fistula   Ureteral stricture, right with chronic nephroureteral stenting    Hernia of pelvic floor   Time spent: 35  Donnalee Curry, MD [facility] Pager [phone]. If 7 PM - 7 AM, please contact night-coverage at [email or web address], password [facility] 04/02/2013, 12:36 PM  LOS: 8 days

## 2013-04-02 NOTE — Progress Notes (Signed)
Patient interviewed and examined. I agree with the assessment and treatment plan outlined by Mr. Marlyne Beards, [place]. Please see my separately dictated note regarding management plans and philosophy.  Angelia Mould. Derrell Lolling, M.D., [facility] General and Minimally invasive Surgery Breast and Colorectal Surgery Office:   [phone] Pager:   [phone]

## 2013-04-02 NOTE — Progress Notes (Signed)
General surgery attending note:   The patient interviewed and examined. Extensive old records reviewed Patient is not having any pain. NG tube is functioning well. Very little stool or flatus in bag. Abdomen is soft, nontender, nondistended. Old midline scar. Urostomy on the right. Colostomy on left with soft reducible parastomal hernia, which is recurrent  Abdominal x-rays actually looked better. Very little gas, just some in the right colon. No obstructive pattern. ND is now well positioned.  I talked to him for a good while. His States that as long as he is not having any pain, he is inclined to do nothing surgically. We talked about the risks involved in surgical intervention given his somewhat advanced age and also the technical challenges involved in this. We talked about the surgery that he had at [facility] with Dr. Barnetta Chapel and how  complex that was. If he develops pain he may want to reconsider, but right now he does not want any surgery.  Plan: Continue NG suction Continue T&A Continue DT prophylaxis Continue ambulation   Jahaan Vanwagner M. Derrell Lolling, M.D., [facility] General and Minimally invasive Surgery Breast and Colorectal Surgery Office:   [phone] Pager:   [phone]

## 2013-04-02 NOTE — Progress Notes (Addendum)
  Subjective: He says he doesn't feel any better and say he has sinus pain. Nothing in ostomy bag except for some (milky yellowish fluid) and some gas.  Objective: Vital signs in last 24 hours: Temp:  [98.1 F (36.7 C)-100.4 F (38 C)] 100.4 F (38 C) (05/05 0559) Pulse Rate:  [56-60] 56 (05/05 0559) Resp:  [16-20] 16 (05/05 0559) BP: (120-138)/(63-67) 138/63 mmHg (05/05 0559) SpO2:  [92 %-93 %] 93 % (05/05 0559) Weight:  [89.903 kg (198 lb 3.2 oz)] 89.903 kg (198 lb 3.2 oz) (05/04 1806) Last BM Date:  (mucous present) 1325 ml from NG, 750 ml in cannister now.   Urine output at 2 liters, NPO TNA for nutrition TM 100.4, VSS Labs OK Last film 5/3 Last CT 4/27 Intake/Output from previous day: 05/04 0701 - 05/05 0700 In: 2718 [I.V.:404.3; NG/GT:30; IV Piggyback:300; TPN:1983.7] Out: 3415 [Urine:2050; Emesis/NG output:1325; Stool:40] Intake/Output this shift:    General appearance: alert, cooperative and still very uncomfortable, complaining of pain over eyebrows and nose. Head: Normocephalic, without obvious abnormality, atraumatic, some tenderness with palpation of sinus GI: sitting up, so I can't tell if he's distended, abd isn't tender, some milky white-yellow fluid in colostomy bag.  minimal gas in bag.    Lab Results:   Recent Labs  03/31/13 0420 04/02/13 0320  WBC 5.5 5.5  HGB 11.4* 11.8*  HCT 32.8* 35.3*  PLT 168 195    BMET  Recent Labs  04/01/13 0335 04/02/13 0320  NA 136 135  K 3.3* 3.5  CL 100 99  CO2 28 29  GLUCOSE 136* 130*  BUN 19 22  CREATININE 1.04 1.02  CALCIUM 8.8 8.8   PT/INR No results found for this basename: LABPROT, INR,  in the last 72 hours   Recent Labs Lab 03/31/13 0420 04/02/13 0320  AST 14 22  ALT 8 17  ALKPHOS 39 39  BILITOT 0.3 0.4  PROT 6.1 6.3  ALBUMIN 2.5* 2.6*     Lipase     Component Value Date/Time   LIPASE 23 06/21/2012 1630     Studies/Results: No results found.  Medications: . heparin  subcutaneous  5,000 Units Subcutaneous Q8H  . insulin aspart  0-9 Units Subcutaneous Q6H  . tiotropium  18 mcg Inhalation QHS    Assessment/Plan SBO, prior Exploratory lap for SBO and lysis of adhesions. 2010  Hx of Recurrent Prostate CA with radiation seeds  Rectourethral fistula with cystectomy, ileal conduit and permanent end colostomy, 2002  Parastomal colostomy hernia  Repair of parastomal Hernia 2009  Chronic right ureteral stricture with stnets by IR  COPD  Depression  AAA  Malnutrition  Dehydration, now resolved, possible fluid overload.  Plan:  No improvement in his colostomy output.  He's complaining of his head and nose this AM.  He has not wanted surgery.  I will recheck film.  Continue TNA for malnutrition.   I have been ask by the case manager if this patient can be transferred to an LTAC facility.  I will discuss with Dr. Derrell Lolling    LOS: 8 days    Sonnia Strong 04/02/2013

## 2013-04-03 LAB — CBC
Hemoglobin: 11.8 g/dL — ABNORMAL LOW (ref 13.0–17.0)
MCHC: 33.1 g/dL (ref 30.0–36.0)
Platelets: 218 10*3/uL (ref 150–400)
RDW: 13.7 % (ref 11.5–15.5)

## 2013-04-03 LAB — BASIC METABOLIC PANEL
Chloride: 96 mEq/L (ref 96–112)
Creatinine, Ser: 0.99 mg/dL (ref 0.50–1.35)
GFR calc Af Amer: 87 mL/min — ABNORMAL LOW (ref >90)

## 2013-04-03 LAB — PHOSPHORUS: Phosphorus: 3.7 mg/dL (ref 2.3–4.6)

## 2013-04-03 LAB — GLUCOSE, CAPILLARY: Glucose-Capillary: 125 mg/dL — ABNORMAL HIGH (ref 70–99)

## 2013-04-03 MED ORDER — POTASSIUM CHLORIDE 10 MEQ/100ML IV SOLN
10.0000 meq | INTRAVENOUS | Status: DC
  Filled 2013-04-03 (×2): qty 100

## 2013-04-03 MED ORDER — POTASSIUM CHLORIDE 10 MEQ/50ML IV SOLN
10.0000 meq | INTRAVENOUS | Status: AC
  Administered 2013-04-03 (×4): 10 meq via INTRAVENOUS
  Filled 2013-04-03 (×6): qty 50

## 2013-04-03 MED ORDER — CLINIMIX E/DEXTROSE (5/20) 5 % IV SOLN
INTRAVENOUS | Status: AC
  Administered 2013-04-03: 17:00:00 via INTRAVENOUS
  Filled 2013-04-03: qty 2200

## 2013-04-03 NOTE — Progress Notes (Addendum)
[facility] PROGRESS NOTE  Michael Novak NWG:[id number] DOB: 1930-10-27 DOA: 03/25/2013 PCP: Kimber Relic, MD  Interim summary: 79 y.o. male has a past medical history significant for prostate cancer (urologist is Dr. 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 He denies any fever or chills. He endorses good colostomy output until the night prior to admission, and he reported nausea without vomiting. In the emergency room a CT scan of the abdomen was done which showed a high-grade small bowel obstruction in the lower lateral left pelvis without obvious obstructing cause.An NG was placed and  Surgery was been consulted and have been following pt.So far SBO has been slow to respond to conservative measures and pt has so far not wanted surgical intervention. He was started on TNA on 5/2 and still with increased output per NG at this time.  Assessment/Plan:  Small bowel obstruction  - Surgery was consulted by the ED physician on admission, appreciate their input.  - Conservative management for now, patient to remain n.p.o., NG tube in place  - D5 half-normal as maintenance fluids  - phenergan for nausea, pain control - Continue current IV fluids, cr improving 1.37 today (from 1.52 on 4/29) -Followup abdominal x-ray 4/30 with improving bowel gas pattern -Patient still with empty colostomy bag -continue TPN which was started on 5/2 &continuing NG as well -still with increased output. -Discussed possible LTAC pt today 5/6 and he states he wants to talk this over with his family  before deciding. -appreciate surgery assistance  AAA  - stable per imaging   Bradycardia/1st degree heart block - 2 sec pause on monitor overnight 4/28, asymptomatic, in sinus rhythm - cardiology saw him 4/28, no interventions.   Ureteral stricture  - nephrostomy in place, renal function close to baseline (~1.2)  - monitor UOP   COPD  - stable, continue Inhalers when necessary   Hyperlipidemia  - hold oral meds while strict NPO.   Cholelithiasis - asymptomatic  Hydronephrosis - s/p stenting in 2013, good UOP Hypokalemia -Replace k  Monoarthritis, R middle distal DIP With elevated uric acid level of 8.6, and even though per x-ray no definite osseus erosion charateristic of gout observed, this could still be gout, he has a h/o of gout (as per review of his medical records-06/17/11 d/c) -continue prn NSAID/toradol- pt still npo -improved clinically  Fever -Remaining afebrile overnight. -chest x-ray, with findings more consistent with atelectasis. Hospitalization.  -Will start on incentive spirometry and follow.  DVT prophylaxis  - heparin s.q.     Code Status: DNR Family Communication: no family at bedside Disposition Plan: Pending clinical course  Consultants:  Surgery  Cardiolgogy  Procedures:  none  Antibiotics:  Anti-infectives   None     Antibiotics Given (last 72 hours)   None      Past antibiotics none  HPI/Subjective: Denies any c/o today, sitting up in chair. +flatus. Nursing and she still with increased output. Objective: Filed Vitals:   04/02/13 1349 04/02/13 2130 04/03/13 0553 04/03/13 1300  BP:  123/65 110/67 119/70  Pulse: 65 58 65 69  Temp: 98.3 F (36.8 C) 98.6 F (37 C)  99.3 F (37.4 C) 98.9 F (37.2 C)  TempSrc: Oral Oral Oral Oral  Resp: 16 18 19 17   Height:      Weight:      SpO2: 93% 93% 94% 95%    Intake/Output Summary (Last 24 hours) at 04/03/13 1425 Last data filed at 04/03/13 0649  Gross per 24 hour   Intake   1445 ml  Output   2075 ml  Net   -630 ml   Filed Weights   03/25/13 1556 04/01/13 1806  Weight: 88.724 kg (195 lb 9.6 oz) 89.903 kg (198 lb 3.2 oz)    Exam:   General:  NAD  HEENT-NG tube in place  Cardiovascular: regular rate and rhythm, bradycardic  Respiratory: good air movement, clear to auscultation throughout, no wheezing, ronchi or rales  Abdomen: soft, mildly tender to palpation; colostomy-empty and urostomy in place.   Extremities: R. Middle finger distal DIP with decreased tenderness and swelling.  Neuro: CN 2-12 grossly intact, MS 5/5 in all 4  Data Reviewed: Basic Metabolic Panel:  Recent Labs Lab 03/29/13 0445 03/31/13 0420 04/01/13 0335 04/02/13 0320 04/03/13 0504  NA 140 137 136 135 134*  K 3.1* 3.6 3.3* 3.5 3.4*  CL 104 102 100 99 96  CO2 29 27 28 29 28   GLUCOSE 115* 124* 136* 130* 127*  BUN 22 17 19 22  29*  CREATININE 1.35 1.09 1.04 1.02 0.99  CALCIUM 8.2* 8.8 8.8 8.8 8.8  MG 1.9 1.8  --  1.9  --   PHOS  --  2.8  --  3.5 3.7   CBC:  Recent Labs Lab 03/28/13 0443 03/29/13 0445 03/31/13 0420 04/02/13 0320 04/03/13 0504  WBC 4.2 4.4 5.5 5.5 6.2  NEUTROABS  --   --  3.2 3.1  --   HGB 12.7* 11.6* 11.4* 11.8* 11.8*  HCT 38.4* 34.1* 32.8* 35.3* 35.6*  MCV 90.8 90.7 89.4 88.7 89.0  PLT 147* 148* 168 195 218   Studies: Dg Chest Port 1 View  04/02/2013  *RADIOLOGY REPORT*  Clinical Data: Evaluate for infiltrates  PORTABLE CHEST - 1 VIEW  Comparison: Prior chest x-ray 01/25/2012  Findings: Left basilar retrocardiac opacity is nonspecific, single view.  Cardiac and mediastinal contours appear similar to prior. There is a right upper extremity approach PICC with the tip projecting over the mid SVC.  Atherosclerotic calcifications noted in the transverse aorta.  Negative for edema, large effusion or pneumothorax.  Minimal central bronchitic changes.  IMPRESSION:  Left retrocardiac opacity may reflect atelectasis and / or infiltrate.  Atelectasis is favored.  The tip of a right upper extremity approach PICC projects over the mid superior vena cava   Original Report Authenticated By: Malachy Moan, M.D.    Dg Abd 2 Views  04/02/2013  *RADIOLOGY REPORT*  Clinical Data: Small bowel obstruction.  ABDOMEN - 2 VIEW  Comparison: Plain films of the abdomen 03/28/2013.  Findings: NG tube is in place the tip in the body of the stomach. Pigtail catheter in the right lower quadrant is noted.  Small bowel dilatation seen on the prior plain films is markedly improved.  Gas and stool are seen in the ascending colon. Abdominal aortic aneurysm is noted.  IMPRESSION: Marked improvement in small bowel obstruction.   Original Report Authenticated By: Holley Dexter, M.D.     Scheduled Meds: . heparin subcutaneous  5,000 Units Subcutaneous Q8H  . insulin aspart  0-9 Units Subcutaneous Q6H  . potassium chloride  10 mEq Intravenous Q1 Hr  x 2  . tiotropium  18 mcg Inhalation QHS   Continuous Infusions: . Marland KitchenTPN (CLINIMIX-E) Adult    . dextrose 5 % and 0.45 % NaCl with KCl 40 mEq/L 20 mL/hr at 04/02/13 1900  . Marland KitchenTPN (CLINIMIX-E) Adult 90 mL/hr at 04/02/13 1900   And  . fat emulsion 250 mL (04/02/13 1900)    Principal Problem:   SBO (small bowel obstruction) Active Problems:   Prostate cancer   Parastomal hernia at left colostomy   COPD (chronic obstructive pulmonary disease)   AAA (abdominal aortic aneurysm)   H/O colostomy   Rectourethral fistula   Ureteral stricture, right with chronic nephroureteral stenting    Hernia of pelvic floor   Time spent: 25  Donnalee Curry, MD [facility] Pager [phone]. If 7 PM - 7 AM, please contact night-coverage at [email or web address], password [facility] 04/03/2013, 2:25 PM  LOS: 9 days

## 2013-04-03 NOTE — Progress Notes (Signed)
Physical Therapy Treatment Patient Details Name: Michael Novak MRN: [id number] DOB: Apr 16, 1930 Today's Date: 04/03/2013 Time: 1205-1224 PT Time Calculation (min): 19 min  PT Assessment / Plan / Recommendation Comments on Treatment Session  Pt reports ambulating yesterday with nsg.  Limited distance today due to headache so pt did not wish to continue with therapy however agreed to try again later today with nsg staff.    Follow Up Recommendations  No PT follow up     Does the patient have the potential to tolerate intense rehabilitation     Barriers to Discharge        Equipment Recommendations  None recommended by PT    Recommendations for Other Services    Frequency     Plan Discharge plan remains appropriate;Frequency remains appropriate    Precautions / Restrictions Precautions Precautions: Fall Precaution Comments: NG tube/NPO/colostomy   Pertinent Vitals/Pain Pt reports severe headache.  RN aware and to bring pain meds.    Mobility  Bed Mobility Bed Mobility: Supine to Sit Supine to Sit: 4: Min assist;3: Mod assist Details for Bed Mobility Assistance: RN assisted trunk upright Transfers Transfers: Sit to Stand;Stand to Sit Sit to Stand: 4: Min guard;With upper extremity assist;From chair/3-in-1 Stand to Sit: 4: Min guard;With upper extremity assist;To chair/3-in-1 Details for Transfer Assistance: verbal cues for hand placement and increased time Ambulation/Gait Ambulation/Gait Assistance: 4: Min guard Ambulation Distance (Feet): 80 Feet Assistive device: Rolling walker Ambulation/Gait Assistance Details: flexed trunk due to abdomen discomfort, limited distance due to headache, adjusted RW in room to pt height and to assist with posture, increased verbal cues for staying inside RW Gait Pattern: Step-through pattern;Decreased stride length;Trunk flexed Gait velocity: decreased     Exercises     PT Diagnosis:    PT Problem List:   PT Treatment Interventions:      PT Goals Acute Rehab PT Goals PT Goal Formulation: With patient Time For Goal Achievement: 04/17/13 (updated due to expired goals) Potential to Achieve Goals: Good Pt will go Sit to Stand: with modified independence PT Goal: Sit to Stand - Progress: Progressing toward goal Pt will go Stand to Sit: with modified independence PT Goal: Stand to Sit - Progress: Progressing toward goal Pt will Ambulate: >150 feet;with modified independence;with least restrictive assistive device PT Goal: Ambulate - Progress: Progressing toward goal Pt will Perform Home Exercise Program: with supervision, verbal cues required/provided  Visit Information  Last PT Received On: 04/03/13 Assistance Needed: +1    Subjective Data  Subjective: I can't do anymore because of this headache. Can you get me some pain meds?  (RN notified)   Cognition  Cognition Arousal/Alertness: Awake/alert Behavior During Therapy: WFL for tasks assessed/performed Overall Cognitive Status: Within Functional Limits for tasks assessed    Balance     End of Session PT - End of Session Activity Tolerance: Patient limited by fatigue;Patient limited by pain Patient left: in chair;with call bell/phone within reach Nurse Communication: Patient requests pain meds   GP     Gasper Hopes,KATHrine E 04/03/2013, 1:50 PM Zenovia Jarred, PT, DPT 04/03/2013 Pager: [phone]

## 2013-04-03 NOTE — Progress Notes (Signed)
PARENTERAL NUTRITION CONSULT NOTE - Follow Up  Pharmacy Consult for TNA Indication: SBO   Allergies  Allergen Reactions  . Augmentin (Amoxicillin-Pot Clavulanate) Other (See Comments)    Reaction unknown.    Patient Measurements: Height: 6\' 2"  (188 cm) Weight: 198 lb 3.2 oz (89.903 kg) IBW/kg (Calculated) : 82.2 Adjusted Body Weight: 84.2  Vital Signs: Temp: 99.3 F (37.4 C) (05/06 0553) Temp src: Oral (05/06 0553) BP: 110/67 mmHg (05/06 0553) Pulse Rate: 65 (05/06 0553)  Intake/Output from previous day: 05/05 0701 - 05/06 0700 In: 1445 [I.V.:260; TPN:1185] Out: 2075 [Urine:1125; Emesis/NG output:950]  Labs:  Recent Labs  04/02/13 0320 04/03/13 0504  WBC 5.5 6.2  HGB 11.8* 11.8*  HCT 35.3* 35.6*  PLT 195 218     Recent Labs  04/01/13 0335 04/02/13 0320 04/03/13 0504  NA 136 135 134*  K 3.3* 3.5 3.4*  CL 100 99 96  CO2 28 29 28   GLUCOSE 136* 130* 127*  BUN 19 22 29*  CREATININE 1.04 1.02 0.99  CALCIUM 8.8 8.8 8.8  MG  --  1.9  --   PHOS  --  3.5 3.7  PROT  --  6.3  --   ALBUMIN  --  2.6*  --   AST  --  22  --   ALT  --  17  --   ALKPHOS  --  39  --   BILITOT  --  0.4  --   PREALBUMIN  --  12.4*  --   TRIG  --  86  --   CHOL  --  122  --    Estimated Creatinine Clearance: 69.2 ml/min (by C-G formula based on Cr of 0.99).    Recent Labs  04/02/13 1800 04/03/13 0019 04/03/13 0640  GLUCAP 114* 125* 152*    Nutritional Goals:  - RD recommendations: Kcal: 2220-2480,  Protein: 106-124 grams,  Fluid: 2.5 L - Clinimix E 5/20 @ 90 ml/hr + IVF 20% at 10 ml/hr on MWF will provide: 108 g protein and 2107 Kcal on average day  Current nutrition:  - Diet: NPO (tolerating ice chips) - TNA: Clinimix E5/20 at 40ml/hr - mIVF: D5-1/2 NS w/ KCl 40 mEq/L @ kvo  CBGs & Insulin requirements past 24 hours:  - 6 units SSI/24h  Assessment:  79 yo M with h/o prostate cancer presented 4/27 with c/o L flank and LLQ abd pain. CT with high grade SBO. Surgery  on board - conservative management recommended. Pt continues to be NPO since admission.  Surgery ordered for PICC and TNA started 5/2.  Plan diet advancement when bowel function returns. Some Flatus but no BM   Labs: Electrolytes: K=3.4, Na=134 - suspect d/t TPN but unable to adjust Renal Function: Scr WNL with adequate UOP, I/O=1445/2075 (NG=965ml). Has chronic nephrostomy tubes for ureteral stricture.  Hepatic Function: wnl Pre-Albumin: 13.6 on 4/30,  10.4 (5/3), 12.4 (5/5) Triglycerides: wnl (5/3), 122 (5/5) CBGs: <150 - no h/o DM,   Plan:  At 1800 tonight  Continue Clinimix E 5/20 to 90 ml/hr (goal rate)  IV fat emulsion 20%, standard multivitamins and trace elements on MWF only due to ongoing shortage  KCl x 4 runs  Continue sensitive SSI q6h  TNA labs Monday/Thursdays  Pharmacy will follow up daily  Monitor edema in hands/feet (improved 5/5)  Juliette Alcide, PharmD, BCPS.   Pager: [phone] 04/03/2013 7:02 AM

## 2013-04-03 NOTE — Progress Notes (Signed)
Subjective: No real change, complaining of headache, but not as bad as yesterday.  He says he walked some yesterday.  Nothing from his ostomy.  Objective: Vital signs in last 24 hours: Temp:  [98.3 F (36.8 C)-99.3 F (37.4 C)] 99.3 F (37.4 C) (05/06 0553) Pulse Rate:  [58-65] 65 (05/06 0553) Resp:  [16-19] 19 (05/06 0553) BP: (110-123)/(65-67) 110/67 mmHg (05/06 0553) SpO2:  [93 %-94 %] 94 % (05/06 0553) Last BM Date:  (mucous present) 950 from NG yesterday,  1125 urine output yesterday.  Afebrile since yesterday, VSS WBC is normal  K+ is down some Film yesterday shows NG in the body of the stomach and improvement of his SBO with stool/gas in ascending colon  Intake/Output from previous day: 05/05 0701 - 05/06 0700 In: 1445 [I.V.:260; TPN:1185] Out: 2075 [Urine:1125; Emesis/NG output:950] Intake/Output this shift:    General appearance: alert, cooperative, no distress and tired. GI: soft, not really distended, +BS, some gas in ostomy and some fluid, doesn't look like stool yet.  Lab Results:   Recent Labs  04/02/13 0320 04/03/13 0504  WBC 5.5 6.2  HGB 11.8* 11.8*  HCT 35.3* 35.6*  PLT 195 218    BMET  Recent Labs  04/02/13 0320 04/03/13 0504  NA 135 134*  K 3.5 3.4*  CL 99 96  CO2 29 28  GLUCOSE 130* 127*  BUN 22 29*  CREATININE 1.02 0.99  CALCIUM 8.8 8.8   PT/INR No results found for this basename: LABPROT, INR,  in the last 72 hours   Recent Labs Lab 03/31/13 0420 04/02/13 0320  AST 14 22  ALT 8 17  ALKPHOS 39 39  BILITOT 0.3 0.4  PROT 6.1 6.3  ALBUMIN 2.5* 2.6*     Lipase     Component Value Date/Time   LIPASE 23 06/21/2012 1630     Studies/Results: Dg Chest Port 1 View  04/02/2013  *RADIOLOGY REPORT*  Clinical Data: Evaluate for infiltrates  PORTABLE CHEST - 1 VIEW  Comparison: Prior chest x-ray 01/25/2012  Findings: Left basilar retrocardiac opacity is nonspecific, single view.  Cardiac and mediastinal contours appear similar  to prior. There is a right upper extremity approach PICC with the tip projecting over the mid SVC.  Atherosclerotic calcifications noted in the transverse aorta.  Negative for edema, large effusion or pneumothorax.  Minimal central bronchitic changes.  IMPRESSION:  Left retrocardiac opacity may reflect atelectasis and / or infiltrate. Atelectasis is favored.  The tip of a right upper extremity approach PICC projects over the mid superior vena cava   Original Report Authenticated By: Malachy Moan, M.D.    Dg Abd 2 Views  04/02/2013  *RADIOLOGY REPORT*  Clinical Data: Small bowel obstruction.  ABDOMEN - 2 VIEW  Comparison: Plain films of the abdomen 03/28/2013.  Findings: NG tube is in place the tip in the body of the stomach. Pigtail catheter in the right lower quadrant is noted.  Small bowel dilatation seen on the prior plain films is markedly improved.  Gas and stool are seen in the ascending colon. Abdominal aortic aneurysm is noted.  IMPRESSION: Marked improvement in small bowel obstruction.   Original Report Authenticated By: Holley Dexter, M.D.     Medications: . heparin subcutaneous  5,000 Units Subcutaneous Q8H  . insulin aspart  0-9 Units Subcutaneous Q6H  . potassium chloride  10 mEq Intravenous Q1 Hr x 4  . tiotropium  18 mcg Inhalation QHS    Assessment/Plan SBO, prior Exploratory lap for SBO and  lysis of adhesions. 2010  Hx of Recurrent Prostate CA with radiation seeds  Rectourethral fistula with cystectomy, ileal conduit and permanent end colostomy, 2002  Parastomal colostomy hernia  Repair of parastomal Hernia 2009  Chronic right ureteral stricture with stnets by IR  COPD  Depression  AAA  Malnutrition  Dehydration, now resolved, possible fluid overload.   Plan:  No real change, I ask him if he had to come to surgery would he want it done here or at [facility], he said he didn't want to go back to [facility], but he still was not wanting surgery.  Continue conservative management.  Films yesterday looked better.  LOS: 9 days    Prudence Heiny 04/03/2013

## 2013-04-03 NOTE — Progress Notes (Signed)
Patient interviewed and examined. I agree with above.  I talked with him for a long time. So long as he does not have abdominal pain or nausea he does not want surgery.   He knows that he might have to live the rest of his life with an NG or PEG.  Not much QOL.  Surgical intervention for his SBO would be a massive undertaking, and would [facility] at a [facility], and I told him this as well.  If he develops pain or signs of compromised bowel this would force our hand in deciding about a) surgery here, b) surgery at tertiary care center, or c)  palliative care.  I discussed this issue with him as well.  I have little else to offer.   Angelia Mould. Derrell Lolling, M.D., [facility] General and Minimally invasive Surgery Breast and Colorectal Surgery Office:   [phone] Pager:   [phone]

## 2013-04-04 ENCOUNTER — Ambulatory Visit (HOSPITAL_COMMUNITY): Admission: RE | Admit: 2013-04-04 | Payer: Medicare Other | Source: Ambulatory Visit

## 2013-04-04 LAB — BASIC METABOLIC PANEL
BUN: 36 mg/dL — ABNORMAL HIGH (ref 6–23)
CO2: 26 mEq/L (ref 19–32)
Chloride: 98 mEq/L (ref 96–112)
Creatinine, Ser: 1.11 mg/dL (ref 0.50–1.35)

## 2013-04-04 LAB — GLUCOSE, CAPILLARY

## 2013-04-04 MED ORDER — FAT EMULSION 20 % IV EMUL
240.0000 mL | INTRAVENOUS | Status: AC
  Administered 2013-04-04: 240 mL via INTRAVENOUS
  Filled 2013-04-04: qty 250

## 2013-04-04 MED ORDER — PANTOPRAZOLE SODIUM 40 MG IV SOLR
40.0000 mg | Freq: Two times a day (BID) | INTRAVENOUS | Status: DC
  Administered 2013-04-04 – 2013-04-08 (×9): 40 mg via INTRAVENOUS
  Filled 2013-04-04 (×10): qty 40

## 2013-04-04 MED ORDER — TRACE MINERALS CR-CU-F-FE-I-MN-MO-SE-ZN IV SOLN
INTRAVENOUS | Status: AC
  Administered 2013-04-04: 17:00:00 via INTRAVENOUS
  Filled 2013-04-04: qty 2200

## 2013-04-04 MED ORDER — FAMOTIDINE IN NACL 20-0.9 MG/50ML-% IV SOLN
20.0000 mg | Freq: Two times a day (BID) | INTRAVENOUS | Status: DC
  Administered 2013-04-04 – 2013-04-05 (×3): 20 mg via INTRAVENOUS
  Filled 2013-04-04 (×4): qty 50

## 2013-04-04 NOTE — Consult Note (Signed)
WOC consult: Pt familiar to WOC team from previous admissions.  Stoma red and viable, above skin level and slightly prolapsed, large peristomal hernia surrounding site. Requested to irrigate colostomy. He has had this occur several times and is very familiar with the process. Used of water to irrigate. Denied pain or cramping. clear/mucous liquid returned immediatly after irrigation.  Patient then got OOB with nurse and ambulated down the hall.  Returned to check on patient approximately 45 minutes after irrigation.  Found approx 50ml of brown liquid in bag. No formed stool particles. Removed irrigation bag and replaced 1 piece kayara colostomy bag over stoma.  Stoma is red and moist. Norva Karvonen RN, MSN Please re-consult if further assistance or another irrigation is needed.  Thank-you,  Cammie Mcgee MSN, RN, CWOCN, [place], CNS [phone]

## 2013-04-04 NOTE — Progress Notes (Signed)
PARENTERAL NUTRITION CONSULT NOTE - Follow Up  Pharmacy Consult for TNA Indication: SBO   Allergies  Allergen Reactions  . Augmentin (Amoxicillin-Pot Clavulanate) Other (See Comments)    Reaction unknown.    Patient Measurements: Height: 6\' 2"  (188 cm) Weight: 198 lb 3.2 oz (89.903 kg) IBW/kg (Calculated) : 82.2 Adjusted Body Weight: 84.2  Vital Signs: Temp: 99 F (37.2 C) (05/07 0440) Temp src: Oral (05/07 0440) BP: 141/64 mmHg (05/07 0440) Pulse Rate: 64 (05/07 0440)  Intake/Output from previous day: 05/06 0701 - 05/07 0700 In: 3830 [I.V.:660; TPN:3170] Out: 2100 [Urine:1300; Emesis/NG output:750; Stool:50]  Labs:  Recent Labs  04/02/13 0320 04/03/13 0504  WBC 5.5 6.2  HGB 11.8* 11.8*  HCT 35.3* 35.6*  PLT 195 218     Recent Labs  04/02/13 0320 04/03/13 0504 04/04/13 0540  NA 135 134* 133*  K 3.5 3.4* 3.7  CL 99 96 98  CO2 29 28 26   GLUCOSE 130* 127* 146*  BUN 22 29* 36*  CREATININE 1.02 0.99 1.11  CALCIUM 8.8 8.8 9.0  MG 1.9  --   --   PHOS 3.5 3.7  --   PROT 6.3  --   --   ALBUMIN 2.6*  --   --   AST 22  --   --   ALT 17  --   --   ALKPHOS 39  --   --   BILITOT 0.4  --   --   PREALBUMIN 12.4*  --   --   TRIG 86  --   --   CHOL 122  --   --    Estimated Creatinine Clearance: 61.7 ml/min (by C-G formula based on Cr of 1.11).    Recent Labs  04/03/13 1909 04/03/13 2348 04/04/13 0609  GLUCAP 134* 148* 130*    Nutritional Goals:  - RD recommendations: Kcal: 2220-2480,  Protein: 106-124 grams,  Fluid: 2.5 L - Clinimix E 5/20 @ 90 ml/hr + IVF 20% at 10 ml/hr on MWF will provide: 108 g protein and 2107 Kcal on average day  Current nutrition:  - Diet: NPO (tolerating ice chips) - TNA: Clinimix E5/20 at 50ml/hr - mIVF: D5-1/2 NS w/ KCl 40 mEq/L @ kvo  CBGs & Insulin requirements past 24 hours:  - 5 units Novolog SSI/24h  Assessment:  79 yo M with h/o prostate cancer presented 4/27 with c/o L flank and LLQ abd pain. CT with high  grade SBO. Surgery on board - conservative management recommended. Pt continues to be NPO since admission.  Surgery ordered for PICC and TNA started 5/2.  Plan diet advancement when bowel function returns. Some Flatus but very little ostomy output.    Labs: Electrolytes: K=3.7 following replacement 5/6, Na=133 - suspect d/t TPN but unable to adjust Renal Function: Scr WNL with adequate UOP, I/O=3830/2100 (NG=738ml). Has chronic nephrostomy tubes for ureteral stricture.  Hepatic Function: wnl Pre-Albumin: 13.6 on 4/30,  10.4 (5/3), 12.4 (5/5) Triglycerides: wnl (5/3), 122 (5/5) CBGs: <150 - no h/o DM,   Plan:  At 1800 tonight  Continue Clinimix E 5/20 to 90 ml/hr (goal rate)  IV fat emulsion 20%, standard multivitamins and trace elements on MWF only due to ongoing shortage  Continue sensitive SSI q6h  TNA labs Monday/Thursdays  Pharmacy will follow up daily  Monitor edema in hands/feet (resolved 5/5)  Juliette Alcide, PharmD, BCPS.   Pager: [phone] 04/04/2013 6:58 AM

## 2013-04-04 NOTE — Progress Notes (Signed)
[facility] PROGRESS NOTE  Michael Novak ZOX:[id number] DOB: 20-May-1930 DOA: 03/25/2013 PCP: Kimber Relic, MD  Interim summary: 79 y.o. male has a past medical history significant for prostate cancer (urologist is Dr. 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 He denies any fever or chills. He endorses good colostomy output until the night prior to admission, and he reported nausea without vomiting. In the emergency room a CT scan of the abdomen was done which showed a high-grade small bowel obstruction in the lower lateral left pelvis without obvious obstructing cause.An NG was placed and  Surgery was been consulted and have been following pt.So far SBO has been slow to respond to conservative measures and pt has so far not wanted surgical intervention. He was started on TNA on 5/2 and still with increased output per NG at this time.  Assessment/Plan:  Small bowel obstruction  - Surgery was consulted by the ED physician on admission, appreciate their input.  - Conservative management for now, patient to remain n.p.o., NG tube in place  - D5 half-normal as maintenance fluids  - phenergan for nausea, pain control - Continue current IV fluids, cr improving 1.37 today (from 1.52 on 4/29) -Followup abdominal x-ray 4/30 with improving bowel gas pattern -Patient still with empty colostomy bag -continue TPN which was started on 5/2 &continuing NG as well -still with increased output. -Ltac met with patient today. We discussed further. Possible transfer tomorrow if no improvement  in condition. -appreciate surgery assistance  AAA  - stable per imaging   Bradycardia/1st degree heart block - 2 sec pause on monitor overnight 4/28, asymptomatic, in sinus rhythm - cardiology saw him 4/28, no interventions.   Ureteral stricture  - nephrostomy in place, renal function close to baseline (~1.2)  - monitor UOP   COPD  - stable, continue Inhalers when necessary   Hyperlipidemia  - hold oral meds while strict NPO.   Cholelithiasis - asymptomatic  Hydronephrosis - s/p stenting in 2013, good UOP Hypokalemia -Replace k  Monoarthritis, R middle distal DIP With elevated uric acid level of 8.6, and even though per x-ray no definite osseus erosion charateristic of gout observed, this could still be gout, he has a h/o of gout (as per review of his medical records-06/17/11 d/c) -continue prn NSAID/toradol- pt still npo -improved clinically  Fever -Remaining afebrile overnight. -chest x-ray, with findings more consistent with atelectasis. Hospitalization.  -Will start on incentive spirometry and follow.  DVT prophylaxis  - heparin s.q.     Code Status: DNR Family Communication: no family at bedside Disposition Plan: Pending clinical course  Consultants:  Surgery  Cardiolgogy  Procedures:  none  Antibiotics:  Anti-infectives   None     Antibiotics Given (last 72 hours)   None      Past antibiotics none  HPI/Subjective: patient about the same. No complaints.  Objective: Filed Vitals:   04/03/13 2020 04/03/13 2034 04/04/13 0440 04/04/13 1500  BP: 129/65  141/64 122/75  Pulse: 65  64 72  Temp: 99 F (37.2 C)  99 F (37.2 C) 98.6 F (37 C)  TempSrc: Oral  Oral Oral  Resp:  18 18 18 20   Height:      Weight:      SpO2: 96%  98% 96%    Intake/Output Summary (Last 24 hours) at 04/04/13 1601 Last data filed at 04/04/13 1351  Gross per 24 hour  Intake   1430 ml  Output   2325 ml  Net   -895 ml   Filed Weights   03/25/13 1556  04/01/13 1806  Weight: 88.724 kg (195 lb 9.6 oz) 89.903 kg (198 lb 3.2 oz)    Exam:   General:  NAD  HEENT-NG tube in place  Cardiovascular: regular rate and rhythm, bradycardic  Respiratory: good air movement, clear to auscultation throughout, no wheezing, ronchi or rales  Abdomen: soft, mildly tender to palpation; colostomy-empty and urostomy in place.   Extremities: R. Middle finger distal DIP with decreased tenderness and swelling. Trace pitting edema bilaterally    Data Reviewed: Basic Metabolic Panel:  Recent Labs Lab 03/29/13 0445 03/31/13 0420 04/01/13 0335 04/02/13 0320 04/03/13 0504 04/04/13 0540  NA 140 137 136 135 134* 133*  K 3.1* 3.6 3.3* 3.5 3.4* 3.7  CL 104 102 100 99 96 98  CO2 29 27 28 29 28 26   GLUCOSE 115* 124* 136* 130* 127* 146*  BUN 22 17 19 22  29* 36*  CREATININE 1.35 1.09 1.04 1.02 0.99 1.11  CALCIUM 8.2* 8.8 8.8 8.8 8.8 9.0  MG 1.9 1.8  --  1.9  --   --   PHOS  --  2.8  --  3.5 3.7  --    CBC:  Recent Labs Lab 03/29/13 0445 03/31/13 0420 04/02/13 0320 04/03/13 0504  WBC 4.4 5.5 5.5 6.2  NEUTROABS  --  3.2 3.1  --   HGB 11.6* 11.4* 11.8* 11.8*  HCT 34.1* 32.8* 35.3* 35.6*  MCV 90.7 89.4 88.7 89.0  PLT 148* 168 195 218   Studies: No results found.  Scheduled Meds: . famotidine (PEPCID) IV  20 mg Intravenous Q12H  . heparin subcutaneous  5,000 Units Subcutaneous Q8H  . insulin aspart  0-9 Units Subcutaneous Q6H  . pantoprazole (PROTONIX) IV  40 mg Intravenous Q12H  . tiotropium  18 mcg Inhalation QHS   Continuous Infusions: . Marland KitchenTPN (CLINIMIX-E) Adult 90 mL/hr at 04/03/13 1718  . dextrose 5 % and 0.45 % NaCl with KCl 40 mEq/L 20 mL/hr at 04/02/13 1900  . Marland KitchenTPN (CLINIMIX-E) Adult     And  . 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 If 7 PM - 7 AM, please contact night-coverage at [email or web address], password [email or web address] 04/04/2013, 4:01 PM  LOS: 10 days

## 2013-04-04 NOTE — Progress Notes (Signed)
Subjective: No real change.  Some stool in the ostomy bag.  Objective: Vital signs in last 24 hours: Temp:  [98.9 F (37.2 C)-99 F (37.2 C)] 99 F (37.2 C) (05/07 0440) Pulse Rate:  [64-69] 64 (05/07 0440) Resp:  [17-18] 18 (05/07 0440) BP: (119-141)/(64-70) 141/64 mmHg (05/07 0440) SpO2:  [95 %-98 %] 98 % (05/07 0440) Last BM Date: 04/03/13 750 from NG, 50 ml from ostomy recorded. Afebrile, VSS, BMP Intake/Output from previous day: 05/06 0701 - 05/07 0700 In: 3830 [I.V.:660; TPN:3170] Out: 2100 [Urine:1300; Emesis/NG output:750; Stool:50] Intake/Output this shift:    General appearance: alert, cooperative, no distress and tired GI: soft, not tender, not really distended much this AM some small stool in ostomy thin slender jump rope sized. some gas in ostomy.  Lab Results:   Recent Labs  04/02/13 0320 04/03/13 0504  WBC 5.5 6.2  HGB 11.8* 11.8*  HCT 35.3* 35.6*  PLT 195 218    BMET  Recent Labs  04/03/13 0504 04/04/13 0540  NA 134* 133*  K 3.4* 3.7  CL 96 98  CO2 28 26  GLUCOSE 127* 146*  BUN 29* 36*  CREATININE 0.99 1.11  CALCIUM 8.8 9.0   PT/INR No results found for this basename: LABPROT, INR,  in the last 72 hours   Recent Labs Lab 03/31/13 0420 04/02/13 0320  AST 14 22  ALT 8 17  ALKPHOS 39 39  BILITOT 0.3 0.4  PROT 6.1 6.3  ALBUMIN 2.5* 2.6*     Lipase     Component Value Date/Time   LIPASE 23 06/21/2012 1630     Studies/Results: Dg Chest Port 1 View  04/02/2013  *RADIOLOGY REPORT*  Clinical Data: Evaluate for infiltrates  PORTABLE CHEST - 1 VIEW  Comparison: Prior chest x-ray 01/25/2012  Findings: Left basilar retrocardiac opacity is nonspecific, single view.  Cardiac and mediastinal contours appear similar to prior. There is a right upper extremity approach PICC with the tip projecting over the mid SVC.  Atherosclerotic calcifications noted in the transverse aorta.  Negative for edema, large effusion or pneumothorax.  Minimal  central bronchitic changes.  IMPRESSION:  Left retrocardiac opacity may reflect atelectasis and / or infiltrate. Atelectasis is favored.  The tip of a right upper extremity approach PICC projects over the mid superior vena cava   Original Report Authenticated By: Malachy Moan, M.D.    Dg Abd 2 Views  04/02/2013  *RADIOLOGY REPORT*  Clinical Data: Small bowel obstruction.  ABDOMEN - 2 VIEW  Comparison: Plain films of the abdomen 03/28/2013.  Findings: NG tube is in place the tip in the body of the stomach. Pigtail catheter in the right lower quadrant is noted.  Small bowel dilatation seen on the prior plain films is markedly improved.  Gas and stool are seen in the ascending colon. Abdominal aortic aneurysm is noted.  IMPRESSION: Marked improvement in small bowel obstruction.   Original Report Authenticated By: Holley Dexter, M.D.     Medications: . heparin subcutaneous  5,000 Units Subcutaneous Q8H  . insulin aspart  0-9 Units Subcutaneous Q6H  . tiotropium  18 mcg Inhalation QHS    Assessment/Plan SBO, prior Exploratory lap for SBO and lysis of adhesions. 2010  Hx of Recurrent Prostate CA with radiation seeds  Rectourethral fistula with cystectomy, ileal conduit and permanent end colostomy, 2002  Parastomal colostomy hernia  Repair of parastomal Hernia 2009  Chronic right ureteral stricture with stents by IR  COPD  Depression  AAA  Malnutrition  Dehydration, now resolved, possible fluid overload.   Plan:  Irrigate colostomy today, Add H2, his NG looks a little red.  Continue conservative Rx.     LOS: 10 days    Michael Novak 04/04/2013

## 2013-04-04 NOTE — Progress Notes (Signed)
Pt's son is at bedside and wants to speak to CM Olegario Messier) regarding DC to LTAC. He states he wants to f/u with Dr. Derrell Lolling regarding possible surgical intervention. Spoke with WOCN and she will come irrigate colostomy.   Son states he will come earlier in the am to speak with Dr. Derrell Lolling regarding surgery, or pt's prognosis without surgery. The patients states he now understands that he cannot stay "tied" to the wall with the suction tubing. Very pleasant.

## 2013-04-04 NOTE — Progress Notes (Signed)
General surgery attending note:    Patient interviewed and examined. I agree with the assessment and treatment plan outlined by Mr. Michael Novak, [place].  Abdominal exam reveals that he is soft and nontender. Large recurrent peristomal hernia is soft and reducible. No tympany or distention. Hard stool in the bag.NG drainage slowing down at little bit.  He is still not interested in surgical intervention. I explained to him that this would require a midline laparotomy, probable takedown and repositioning of his colostomy elsewhere in the abdomen, and primary repair of the hernia and lysis of adhesions to take care of the mechanical obstruction which is probably an adhesion down in his pelvis. He understands these issues.  For today, we will irrigate his colostomy to see if that will help open him up. We will get some abdominal films tomorrow. Add Protonix IV to decrease gastric secretion.   Angelia Mould. Derrell Lolling, M.D., [facility] General and Minimally invasive Surgery Breast and Colorectal Surgery Office:   [phone] Pager:   [phone]

## 2013-04-05 ENCOUNTER — Inpatient Hospital Stay (HOSPITAL_COMMUNITY): Payer: Medicare Other

## 2013-04-05 LAB — COMPREHENSIVE METABOLIC PANEL
ALT: 36 U/L (ref 0–53)
Alkaline Phosphatase: 51 U/L (ref 39–117)
CO2: 26 mEq/L (ref 19–32)
Chloride: 101 mEq/L (ref 96–112)
GFR calc Af Amer: 63 mL/min — ABNORMAL LOW (ref >90)
GFR calc non Af Amer: 54 mL/min — ABNORMAL LOW (ref >90)
Glucose, Bld: 139 mg/dL — ABNORMAL HIGH (ref 70–99)
Potassium: 3.8 mEq/L (ref 3.5–5.1)
Sodium: 136 mEq/L (ref 135–145)

## 2013-04-05 LAB — GLUCOSE, CAPILLARY
Glucose-Capillary: 131 mg/dL — ABNORMAL HIGH (ref 70–99)
Glucose-Capillary: 153 mg/dL — ABNORMAL HIGH (ref 70–99)

## 2013-04-05 MED ORDER — GABAPENTIN 300 MG PO CAPS
300.0000 mg | ORAL_CAPSULE | Freq: Once | ORAL | Status: AC
  Administered 2013-04-05: 300 mg via ORAL
  Filled 2013-04-05: qty 1

## 2013-04-05 MED ORDER — INSULIN ASPART 100 UNIT/ML ~~LOC~~ SOLN: 0.0000 [IU] | Freq: Three times a day (TID) | SUBCUTANEOUS | Status: DC

## 2013-04-05 MED ORDER — CLINIMIX E/DEXTROSE (5/20) 5 % IV SOLN
INTRAVENOUS | Status: AC
  Administered 2013-04-05: 17:00:00 via INTRAVENOUS
  Filled 2013-04-05: qty 2200

## 2013-04-05 MED ORDER — ALTEPLASE 100 MG IV SOLR
2.0000 mg | Freq: Once | INTRAVENOUS | Status: AC
  Administered 2013-04-05: 2 mg
  Filled 2013-04-05: qty 2

## 2013-04-05 MED ORDER — INSULIN ASPART 100 UNIT/ML ~~LOC~~ SOLN
0.0000 [IU] | Freq: Three times a day (TID) | SUBCUTANEOUS | Status: DC
  Administered 2013-04-05: 2 [IU] via SUBCUTANEOUS
  Administered 2013-04-05 – 2013-04-06 (×2): 1 [IU] via SUBCUTANEOUS
  Administered 2013-04-06: 2 [IU] via SUBCUTANEOUS
  Administered 2013-04-06 – 2013-04-07 (×4): 1 [IU] via SUBCUTANEOUS

## 2013-04-05 NOTE — Progress Notes (Signed)
PT Cancellation Note  Patient Details Name: Michael Novak MRN: [id number] DOB: Jul 01, 1930   Cancelled Treatment:     Pt has walked with nursing 4 xs today and doing well. Pt has supportive family and feels no further acute PT needs at this time. Does not have steps to enter home and at level very safe to ambulate with nursing staff at this time.  Will DC PT services. Please reorder if you see any change in status or needs for PT.  Thanks    Marella Bile 04/05/2013, 3:45 PM Marella Bile, PT Pager: [phone] 04/05/2013

## 2013-04-05 NOTE — Progress Notes (Addendum)
PARENTERAL NUTRITION CONSULT NOTE - Follow Up  Pharmacy Consult for TNA Indication: SBO   Allergies  Allergen Reactions  . Augmentin (Amoxicillin-Pot Clavulanate) Other (See Comments)    Reaction unknown.    Patient Measurements: Height: 6\' 2"  (188 cm) Weight: 198 lb 3.2 oz (89.903 kg) IBW/kg (Calculated) : 82.2 Adjusted Body Weight: 84.2  Vital Signs: Temp: 98.2 F (36.8 C) (05/08 0536) Temp src: Oral (05/08 0536) BP: 138/68 mmHg (05/08 0536) Pulse Rate: 66 (05/08 0536)  Intake/Output from previous day: 05/07 0701 - 05/08 0700 In: 1230 [TPN:1230] Out: 2525 [Urine:1625; Emesis/NG output:900]  Labs:  Recent Labs  04/03/13 0504  WBC 6.2  HGB 11.8*  HCT 35.6*  PLT 218     Recent Labs  04/03/13 0504 04/04/13 0540 04/05/13 0607  NA 134* 133* 136  K 3.4* 3.7 3.8  CL 96 98 101  CO2 28 26 26   GLUCOSE 127* 146* 139*  BUN 29* 36* 43*  CREATININE 0.99 1.11 1.22  CALCIUM 8.8 9.0 9.1  MG  --   --  2.1  PHOS 3.7  --  4.1  PROT  --   --  6.6  ALBUMIN  --   --  2.5*  AST  --   --  25  ALT  --   --  36  ALKPHOS  --   --  51  BILITOT  --   --  0.2*   Estimated Creatinine Clearance: 56.1 ml/min (by C-G formula based on Cr of 1.22).    Recent Labs  04/04/13 1753 04/05/13 0045 04/05/13 0605  GLUCAP 137* 132* 131*    Nutritional Goals:  - RD recommendations: Kcal: 2220-2480,  Protein: 106-124 grams,  Fluid: 2.5 L - Clinimix E 5/20 @ 90 ml/hr + IVF 20% at 10 ml/hr on MWF will provide: 108 g protein and 2107 Kcal on average day  Current nutrition:  - Diet: NPO (tolerating ice chips) - TNA: Clinimix E5/20 at 20ml/hr - mIVF: D5-1/2 NS w/ KCl 40 mEq/L @ kvo  CBGs & Insulin requirements past 24 hours:  - 6 units Novolog SSI/24h  Assessment:  79 yo M with h/o prostate cancer presented 4/27 with c/o L flank and LLQ abd pain. CT with high grade SBO. Surgery on board - conservative management recommended. Pt continues to be NPO since admission.  Surgery ordered  for PICC and TNA started 5/2.  Plan diet advancement when bowel function returns. Some Flatus, RN stated 5/7 that starting to see some formed stool in ostomy but not much output documented.  ? Of transfer to [facility] today.    Labs: Electrolytes: K=3.8 following replacement 5/6, Na=136 (improved) , Corr Ca = 10.3.  Renal Function: Scr WNL with adequate UOP,  NG output=949ml (increased). Has chronic nephrostomy tubes for ureteral stricture.  Hepatic Function: WNL 5/8 Pre-Albumin: 13.6 on 4/30,  10.4 (5/3), 12.4 (5/5) Triglycerides: wnl (5/3), 122 (5/5) CBGs: <150 - no h/o DM,   Plan:  At 1800 tonight  Continue Clinimix E 5/20 to 90 ml/hr (goal rate)  IV fat emulsion 20%, standard multivitamins and trace elements on MWF only due to ongoing shortage  Continue sensitive SSI, decrease freq to q8h  TNA labs Monday/Thursdays  Pharmacy will follow up daily  Monitor edema in hands/feet (resolved 5/5)  Watch Phos trend  If TPN likely to be long-term will need to look at cycling TPN.   Follow-up possible transfer to [facility] today and need to make TPN   Plans are now for  possible surgery   Try clears and trial of NGT clamping today  Juliette Alcide, PharmD, BCPS.   Pager: [phone] 04/05/2013 7:07 AM

## 2013-04-05 NOTE — Progress Notes (Signed)
General surgery attending note:  The patient has passed some stool and a lot of flatus in his back. He has no pain or nausea and states that he is hungry. NG drainage is bilious. Abdominal x-rays showed lots of air throughout the colon, and just a little bit of air in the small bowel. Abdominal aortic aneurysm is noted.This looks better.  Along talk with the patient and his son. We talked about algorithms for care. We talked about what would be involved if he required an operation either here or at a university center. Then know they have their choice. I told him that since he seemed to be opening up that we should give him a chance to resolve his obstruction without surgery, which I think would be in his best interest.  Today, we're going to clamp his NG tube and let him have clear liquids. We'll also going to ask him to ambulate more and irrigate his colostomy one more time since that seemed to have helped.   Angelia Mould. Derrell Lolling, M.D., [facility] General and Minimally invasive Surgery Breast and Colorectal Surgery Office:   [phone] Pager:   [phone]

## 2013-04-05 NOTE — Progress Notes (Signed)
CSW completed advanced directive with patient. Gave patient original and placed copy on chart.  Cannon Quinton C. Kenyatta Keidel MSW, LCSW [phone]

## 2013-04-05 NOTE — Progress Notes (Signed)
Patient ID: Michael Novak, male   DOB: [birth year], 79 y.o.   MRN: [id number]    Subjective: Feels better today, no nausea, son in room, has gas and a piece of hard stool in bag  Objective: Vital signs in last 24 hours: Temp:  [98 F (36.7 C)-98.6 F (37 C)] 98.2 F (36.8 C) Apr 10, 2023 0536) Pulse Rate:  [64-72] 66 04-10-2023 0536) Resp:  [18-20] 18 04-10-2023 0536) BP: (122-138)/(56-75) 138/68 mmHg 04/10/2023 0536) SpO2:  [96 %-98 %] 98 % 04-10-2023 0536) Last BM Date: 04/04/13 750 from NG, 50 ml from ostomy recorded. Afebrile, VSS, BMP Intake/Output from previous day: 05/07 0701 - Apr 10, 2023 0700 In: 1230 [TPN:1230] Out: 2525 [Urine:1625; Emesis/NG output:900] Intake/Output this shift:    General appearance: alert, cooperative, no distress  GI: soft, not tender, soft 3-4 in piece on round hard stool in bag, lots of gas in bag  Lab Results:   Recent Labs  04/03/13 0504  WBC 6.2  HGB 11.8*  HCT 35.6*  PLT 218    BMET  Recent Labs  04/04/13 0540 2013-04-09 0607  NA 133* 136  K 3.7 3.8  CL 98 101  CO2 26 26  GLUCOSE 146* 139*  BUN 36* 43*  CREATININE 1.11 1.22  CALCIUM 9.0 9.1   PT/INR No results found for this basename: LABPROT, INR,  in the last 72 hours   Recent Labs Lab 03/31/13 0420 04/02/13 0320 04-09-2013 0607  AST 14 22 25   ALT 8 17 36  ALKPHOS 39 39 51  BILITOT 0.3 0.4 0.2*  PROT 6.1 6.3 6.6  ALBUMIN 2.5* 2.6* 2.5*     Lipase     Component Value Date/Time   LIPASE 23 06/21/2012 1630     Studies/Results: Dg Abd 2 Views  2013/04/09  *RADIOLOGY REPORT*  Clinical Data: Abdominal pain and distention, history small bowel obstruction  ABDOMEN - 2 VIEW  Comparison: 04/02/2013  Findings: Nasogastric tube extends into stomach. Drainage catheter upper right pelvis. Gas present in colon. Single mildly dilated small bowel loop in left mid abdomen. No definite bowel wall thickening or free intraperitoneal air. Curvilinear left paraspinal calcification L3-L5 corresponding to  abdominal aortic aneurysm on recent CT. Diffuse osseous demineralization. L1 compression fracture again noted.  IMPRESSION: Single mildly dilated small bowel loop in left mid abdomen. No evidence of bowel wall thickening or perforation. Abdominal aortic aneurysm.   Original Report Authenticated By: Ulyses Southward, M.D.     Medications: . famotidine (PEPCID) IV  20 mg Intravenous Q12H  . heparin subcutaneous  5,000 Units Subcutaneous Q8H  . insulin aspart  0-9 Units Subcutaneous Q8H  . pantoprazole (PROTONIX) IV  40 mg Intravenous Q12H  . tiotropium  18 mcg Inhalation QHS    Assessment/Plan SBO, prior Exploratory lap for SBO and lysis of adhesions. 2010  Hx of Recurrent Prostate CA with radiation seeds  Rectourethral fistula with cystectomy, ileal conduit and permanent end colostomy, 2002  Parastomal colostomy hernia  Repair of parastomal Hernia 2009  Chronic right ureteral stricture with stents by IR  COPD  Depression  AAA  Malnutrition  Dehydration, now resolved, possible fluid overload.   Plan:  Has a piece of hard stool in bag and lots of gas, he actually feels better today, films look better, Dr. Derrell Lolling had long talk with son and patient and with improvement will continue conservative management, will clamp NGT and give clears today.  If he does well then might be able to pull NGT tomorrow.  Will have  WOC irrigate colostomy again today, ambulate q1 while awake.       LOS: 11 days    Michael Novak 04/05/2013

## 2013-04-05 NOTE — Consult Note (Signed)
WOC consult- repeat colostomy irrigation Emptied medium sized formed stool from pouch just prior to irrigation Stoma red and viable, above skin level and slightly prolapsed, large peristomal hernia surrounding site.   Used of water to irrigate. Denied pain or cramping. clear/mucous liquid returned immediatly after irrigation. Was able to remove small formed stool with my gloved finger during the irrigation process.  Pt tolerated without problems. Removed irrigation bag and replaced 1 piece kayara colostomy bag over stoma.  Irrigation supplies are in the room for future use if needed. Left in bathroom.  Re consult if needed, will not follow at this time. Thanks  Zaine Elsass [employer], CWOCN [phone])

## 2013-04-05 NOTE — Progress Notes (Signed)
[facility] PROGRESS NOTE  Michael Novak:[id number] DOB: 1930-07-15 DOA: 03/25/2013 PCP: Kimber Relic, MD  Interim summary: 79 y.o. male has a past medical history significant for prostate cancer (urologist is Dr. 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 He denies any fever or chills. He endorses good colostomy output until the night prior to admission, and he reported nausea without vomiting. In the emergency room a CT scan of the abdomen was done which showed a high-grade small bowel obstruction in the lower lateral left pelvis without obvious obstructing cause.An NG was placed and  Surgery was been consulted and have been following pt.So far SBO has been slow to respond to conservative measures and pt has so far not wanted surgical intervention. He was started on TNA on 5/2 and still with increased output per NG at this time.  Assessment/Plan:  Small bowel obstruction  - Surgery was consulted by the ED physician on admission, appreciate their input.  - Conservative management for now, patient to remain n.p.o., NG tube in place  - D5 half-normal as maintenance fluids  - phenergan for nausea, pain control - Continue current IV fluids, cr improving 1.37 today (from 1.52 on 4/29) -Followup abdominal x-ray 4/30 with improving bowel gas pattern -Patient still with empty colostomy bag -continue TPN which was started on 5/2 &continuing NG as well -still with increased output. Looks to be improving. More solid stool today. NG tube to be clampedand started on clear  liquids  AAA  - stable per imaging   Bradycardia/1st degree heart block - 2 sec pause on monitor overnight 4/28, asymptomatic, in sinus rhythm - cardiology saw him 4/28, no interventions.   Ureteral stricture  - nephrostomy in place, renal function close to baseline (~1.2)  - monitor UOP   COPD  - stable, continue Inhalers when necessary   Hyperlipidemia  - hold oral meds while strict NPO.   Cholelithiasis - asymptomatic  Hydronephrosis - s/p stenting in 2013, good UOP Hypokalemia -Replace k  Monoarthritis, R middle distal DIP With elevated uric acid level of 8.6, and even though per x-ray no definite osseus erosion charateristic of gout observed, this could still be gout, he has a h/o of gout (as per review of his medical records-06/17/11 d/c) -continue prn NSAID/toradol- pt still npo -improved clinically  Fever -Remaining afebrile overnight. -chest x-ray, with findings more consistent with atelectasis. Hospitalization.  -Will start on incentive spirometry and follow.  DVT prophylaxis  - heparin s.q.     Code Status: DNR Family Communication: no family at bedside Disposition Plan: Pending clinical course  Consultants:  Surgery  Cardiolgogy  Procedures:  none  Antibiotics:  Anti-infectives   None     Antibiotics Given (last 72 hours)   None      Past antibiotics none  HPI/Subjective:  patient feeling a little bit better, feels more comfortable about possibly SBO resolving  Objective: Filed Vitals:   04/04/13 2059 04/04/13 2135 04/05/13 0536 04/05/13 1330  BP:  123/56 138/68 129/70  Pulse:  64 66 68  Temp:  98 F (36.7 C) 98.2 F (36.8 C) 98.3 F (36.8 C)  TempSrc:  Oral Oral Oral  Resp: 18 18 18 16   Height:      Weight:      SpO2:  98% 98% 98%    Intake/Output Summary (Last 24 hours) at 04/05/13 1805 Last data filed at 04/05/13 1600  Gross per 24 hour  Intake 2586.83 ml  Output   1450 ml  Net 1136.83 ml   Filed Weights    03/25/13 1556 04/01/13 1806  Weight: 88.724 kg (195 lb 9.6 oz) 89.903 kg (198 lb 3.2 oz)    Exam:   General:  NAD  HEENT-NG tube in place, clamped  Cardiovascular: regular rate and rhythm, bradycardic  Respiratory: good air movement, clear to auscultation throughout, no wheezing, ronchi or rales  Abdomen: soft, mildly tender to palpation; colostomy-empty and urostomy in place.   Extremities: R. Middle finger distal DIP with decreased tenderness and swelling. Trace pitting edema bilaterally    Data Reviewed: Basic Metabolic Panel:  Recent Labs Lab 03/31/13 0420 04/01/13 0335 04/02/13 0320 04/03/13 0504 04/04/13 0540 04/05/13 0607  NA 137 136 135 134* 133* 136  K 3.6 3.3* 3.5 3.4* 3.7 3.8  CL 102 100 99 96 98 101  CO2 27 28 29 28 26 26   GLUCOSE 124* 136* 130* 127* 146* 139*  BUN 17 19 22  29* 36* 43*  CREATININE 1.09 1.04 1.02 0.99 1.11 1.22  CALCIUM 8.8 8.8 8.8 8.8 9.0 9.1  MG 1.8  --  1.9  --   --  2.1  PHOS 2.8  --  3.5 3.7  --  4.1   CBC:  Recent Labs Lab 03/31/13 0420 04/02/13 0320 04/03/13 0504  WBC 5.5 5.5 6.2  NEUTROABS 3.2 3.1  --   HGB 11.4* 11.8* 11.8*  HCT 32.8* 35.3* 35.6*  MCV 89.4 88.7 89.0  PLT 168 195 218   Studies: Dg Abd 2 Views  04/05/2013   IMPRESSION: Single mildly dilated small bowel loop in left mid abdomen. No evidence of bowel wall thickening or perforation. Abdominal aortic aneurysm.   Original Report Authenticated By: Ulyses Southward, M.D.     Scheduled Meds: . famotidine (PEPCID) IV  20 mg Intravenous Q12H  . heparin subcutaneous  5,000 Units Subcutaneous Q8H  . insulin aspart  0-9 Units Subcutaneous TID WC  . pantoprazole (PROTONIX) IV  40 mg Intravenous Q12H  . tiotropium  18 mcg Inhalation QHS   Continuous Infusions: . Marland KitchenTPN (CLINIMIX-E) Adult 90 mL/hr at 04/05/13 1728  . 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 If 7 PM - 7 AM, please contact night-coverage at [email or web address], password [facility] 04/05/2013, 6:05 PM  LOS: 11 days

## 2013-04-06 ENCOUNTER — Inpatient Hospital Stay (HOSPITAL_COMMUNITY): Payer: Medicare Other

## 2013-04-06 LAB — GLUCOSE, CAPILLARY
Glucose-Capillary: 121 mg/dL — ABNORMAL HIGH (ref 70–99)
Glucose-Capillary: 131 mg/dL — ABNORMAL HIGH (ref 70–99)

## 2013-04-06 MED ORDER — POLYETHYLENE GLYCOL 3350 17 G PO PACK
8.0000 g | PACK | Freq: Every day | ORAL | Status: DC
  Administered 2013-04-06 – 2013-04-09 (×4): 8 g via ORAL
  Filled 2013-04-06 (×4): qty 1

## 2013-04-06 MED ORDER — FAT EMULSION 20 % IV EMUL
240.0000 mL | INTRAVENOUS | Status: DC
  Administered 2013-04-06: 240 mL via INTRAVENOUS
  Filled 2013-04-06: qty 250

## 2013-04-06 MED ORDER — TRACE MINERALS CR-CU-F-FE-I-MN-MO-SE-ZN IV SOLN
INTRAVENOUS | Status: DC
  Administered 2013-04-06: 18:00:00 via INTRAVENOUS
  Filled 2013-04-06: qty 2200

## 2013-04-06 MED ORDER — IOHEXOL 300 MG/ML  SOLN
10.0000 mL | Freq: Once | INTRAMUSCULAR | Status: AC | PRN
  Administered 2013-04-06: 10 mL

## 2013-04-06 NOTE — Progress Notes (Signed)
Patient ID: Michael Novak, male   DOB: [birth year], 79 y.o.   MRN: [id number]    Subjective: Pt feels well today.  Still some flatus, but no BM in bag.  Tolerating clear liquids with NGT clamped for last 24 hrs.  Objective: Vital signs in last 24 hours: Temp:  [97.5 F (36.4 C)-98.3 F (36.8 C)] 97.8 F (36.6 C) (05/09 0454) Pulse Rate:  [58-69] 58 (05/09 0454) Resp:  [16-18] 18 (05/09 0454) BP: (125-129)/(61-70) 125/61 mmHg (05/09 0454) SpO2:  [95 %-98 %] 95 % (05/09 0454) Last BM Date: 04/09/2013  Intake/Output from previous day: 10-Apr-2023 0701 - 05/09 0700 In: 2067.7 [P.O.:540; I.V.:243; IV Piggyback:50; TPN:1234.7] Out: 1900 [Urine:1900] Intake/Output this shift:    PE: Abd: soft, minimal distention, parastomal hernia noted.  Ostomy with air, but no stool, urostomy in place. Heart: reagular  Lab Results:  No results found for this basename: WBC, HGB, HCT, PLT,  in the last 72 hours BMET  Recent Labs  04/04/13 0540 Apr 09, 2013 0607  NA 133* 136  K 3.7 3.8  CL 98 101  CO2 26 26  GLUCOSE 146* 139*  BUN 36* 43*  CREATININE 1.11 1.22  CALCIUM 9.0 9.1   PT/INR No results found for this basename: LABPROT, INR,  in the last 72 hours CMP     Component Value Date/Time   NA 136 04-09-13 0607   K 3.8 Apr 09, 2013 0607   CL 101 2013-04-09 0607   CO2 26 09-Apr-2013 0607   GLUCOSE 139* 2013-04-09 0607   BUN 43* Apr 09, 2013 0607   CREATININE 1.22 Apr 09, 2013 0607   CALCIUM 9.1 04-09-13 0607   PROT 6.6 04/09/13 0607   ALBUMIN 2.5* 04/09/13 0607   AST 25 09-Apr-2013 0607   ALT 36 2013-04-09 0607   ALKPHOS 51 09-Apr-2013 0607   BILITOT 0.2* Apr 09, 2013 0607   GFRNONAA 54* 2013-04-09 0607   GFRAA 63* Apr 09, 2013 0607   Lipase     Component Value Date/Time   LIPASE 23 06/21/2012 1630       Studies/Results: Dg Abd 2 Views  Apr 09, 2013  *RADIOLOGY REPORT*  Clinical Data: Abdominal pain and distention, history small bowel obstruction  ABDOMEN - 2 VIEW  Comparison: 04/02/2013  Findings: Nasogastric tube  extends into stomach. Drainage catheter upper right pelvis. Gas present in colon. Single mildly dilated small bowel loop in left mid abdomen. No definite bowel wall thickening or free intraperitoneal air. Curvilinear left paraspinal calcification L3-L5 corresponding to abdominal aortic aneurysm on recent CT. Diffuse osseous demineralization. L1 compression fracture again noted.  IMPRESSION: Single mildly dilated small bowel loop in left mid abdomen. No evidence of bowel wall thickening or perforation. Abdominal aortic aneurysm.   Original Report Authenticated By: Ulyses Southward, M.D.     Anti-infectives: Anti-infectives   None       Assessment/Plan  1. SBO, may be improving  Plan: 1. Will obtain films today.  If these look ok, we can likely try to dc NGT as he has tolerated his NG clamped for 24 hrs and his trial of clears.  I'm a little hesitant to think he has completely resolved without any more in his bag than just air, but will follow.   LOS: 12 days    An Schnabel E 04/06/2013, 9:01 AM Pager: [phone]

## 2013-04-06 NOTE — Procedures (Signed)
Procedure:  Right nephroureteral catheter exchange Findings:  New 10 Fr catheter placed over guidewire through ileostomy.  Tube formed in right renal pelvis.

## 2013-04-06 NOTE — Progress Notes (Signed)
[facility] PROGRESS NOTE  Michael Novak ZOX:[id number] DOB: 03-25-1930 DOA: 03/25/2013 PCP: Kimber Relic, MD  Interim summary: 79 y.o. male has a past medical history significant for prostate cancer (urologist is Dr. 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 He denies any fever or chills. He endorses good colostomy output until the night prior to admission, and he reported nausea without vomiting. In the emergency room a CT scan of the abdomen was done which showed a high-grade small bowel obstruction in the lower lateral left pelvis without obvious obstructing cause.An NG was placed and  Surgery was been consulted and have been following pt.So far SBO has been slow to respond to conservative measures and pt has so far not wanted surgical intervention. He was started on TNA on 5/2.   Her sister having increased output into his ostomy. NG tube clamped and started on clears. Assessment/Plan:  Small bowel obstruction  - Surgery was consulted by the ED physician on admission, appreciate their input.  - Conservative management for now, NG tube in place but clamped. Started on clears and - D5 half-normal as maintenance fluids  - phenergan for nausea, pain control - Continue current IV fluids, cr improving 1.37 today (from 1.52 on 4/29) -Followup abdominal x-ray 4/30 with improving bowel gas pattern -Patient still with empty colostomy bag -continue TPN which was started on 5/2 &continuing NG as well -still with increased output. Looks to be improving. More solid stool today.  Tolerating clears  AAA  - stable per imaging   Bradycardia/1st degree heart block - 2 sec pause on monitor overnight 4/28, asymptomatic, in sinus rhythm - cardiology saw him 4/28, no interventions.   Ureteral stricture  - nephrostomy in place, renal function close to baseline (~1.2)  - monitor UOP . Exchange tube done today  COPD  - stable, continue Inhalers when necessary   Hyperlipidemia  - hold oral meds while strict NPO.   Cholelithiasis - asymptomatic  Hydronephrosis - s/p stenting in 2013, good UOP Hypokalemia -Replace k  Monoarthritis, R middle distal DIP With elevated uric acid level of 8.6, and even though per x-ray no definite osseus erosion charateristic of gout observed, this could still be gout, he has a h/o of gout (as per review of his medical records-06/17/11 d/c) -continue prn NSAID/toradol- pt still npo -improved clinically  Fever -Remaining afebrile for several days -chest x-ray, with findings more consistent with atelectasis. Hospitalization.  -Will start on incentive spirometry and follow.  DVT prophylaxis  - heparin s.q.     Code Status: DNR Family Communication: Spoke with his son earlier today Disposition Plan: Pending clinical course  Consultants:  Surgery  Cardiolgogy  Procedures:  none  Antibiotics:  Anti-infectives   None     Antibiotics Given (last 72 hours)   None      Past antibiotics none  HPI/Subjective:  patient continues to feel better. Is optimistic about small bowel obstruction resolving  Objective: Filed Vitals:   04/05/13 1330 04/05/13 2301 04/06/13 0454 04/06/13 1424  BP: 129/70 129/65 125/61 108/64  Pulse: 68 69 58 66  Temp: 98.3 F (36.8 C) 97.5 F (36.4 C)  97.8 F (36.6 C) 98.3 F (36.8 C)  TempSrc: Oral Oral Oral Oral  Resp: 16 16 18 18   Height:      Weight:      SpO2: 98% 98% 95% 94%    Intake/Output Summary (Last 24 hours) at 04/06/13 1515 Last data filed at 04/06/13 1423  Gross  per 24 hour  Intake 1987.67 ml  Output   2900 ml  Net -912.33 ml   Filed Weights   03/25/13 1556 04/01/13 1806  Weight: 88.724 kg (195 lb 9.6 oz) 89.903 kg (198 lb 3.2 oz)    Exam:   General:  NAD  HEENT-NG tube in place, clamped  Cardiovascular: regular rate and rhythm, bradycardic  Respiratory: good air movement, clear to auscultation throughout, no wheezing, ronchi or rales  Abdomen: soft, mildly tender to palpation; colostomy-empty and urostomy in place.   Extremities: R. Middle finger distal DIP with decreased tenderness and swelling. Trace pitting edema bilaterally    Data Reviewed: Basic Metabolic Panel:  Recent Labs Lab 03/31/13 0420 04/01/13 0335 04/02/13 0320 04/03/13 0504 04/04/13 0540 04/05/13 0607  NA 137 136 135 134* 133* 136  K 3.6 3.3* 3.5 3.4* 3.7 3.8  CL 102 100 99 96 98 101  CO2 27 28 29 28 26 26   GLUCOSE 124* 136* 130* 127* 146* 139*  BUN 17 19 22  29* 36* 43*  CREATININE 1.09 1.04 1.02 0.99 1.11 1.22  CALCIUM 8.8 8.8 8.8 8.8 9.0 9.1  MG 1.8  --  1.9  --   --  2.1  PHOS 2.8  --  3.5 3.7  --  4.1   CBC:  Recent Labs Lab 03/31/13 0420 04/02/13 0320 04/03/13 0504  WBC 5.5 5.5 6.2  NEUTROABS 3.2 3.1  --   HGB 11.4* 11.8* 11.8*  HCT 32.8* 35.3* 35.6*  MCV 89.4 88.7 89.0  PLT 168 195 218   Studies: Dg Abd 2 Views  04/05/2013   IMPRESSION: Single mildly dilated small bowel loop in left mid abdomen. No evidence of bowel wall thickening or perforation. Abdominal aortic aneurysm.   Original Report Authenticated By: Ulyses Southward, M.D.     Scheduled Meds: . heparin subcutaneous  5,000 Units Subcutaneous Q8H  . insulin aspart  0-9 Units Subcutaneous TID WC  . pantoprazole (PROTONIX) IV  40 mg Intravenous Q12H  . polyethylene glycol  8 g Oral Daily  . tiotropium  18 mcg Inhalation QHS   Continuous Infusions: . Marland KitchenTPN (CLINIMIX-E) Adult 90 mL/hr at 04/05/13 1728  . dextrose 5 % and 0.45 % NaCl with KCl 40 mEq/L 20 mL/hr at 04/05/13 1900  .  Marland KitchenTPN (CLINIMIX-E) Adult     And  . 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 If 7 PM - 7 AM, please contact night-coverage at [email or web address], password [facility] 04/06/2013, 3:15 PM  LOS: 12 days

## 2013-04-06 NOTE — Progress Notes (Signed)
PARENTERAL NUTRITION CONSULT NOTE - Follow Up  Pharmacy Consult for TNA Indication: SBO   Allergies  Allergen Reactions  . Augmentin (Amoxicillin-Pot Clavulanate) Other (See Comments)    Reaction unknown.    Patient Measurements: Height: 6\' 2"  (188 cm) Weight: 198 lb 3.2 oz (89.903 kg) IBW/kg (Calculated) : 82.2 Adjusted Body Weight: 84.2  Vital Signs: Temp: 97.8 F (36.6 C) (05/09 0454) Temp src: Oral (05/09 0454) BP: 125/61 mmHg (05/09 0454) Pulse Rate: 58 (05/09 0454)  Intake/Output from previous day: 05/08 0701 - 05/09 0700 In: 2067.7 [P.O.:540; I.V.:243; IV Piggyback:50; TPN:1234.7] Out: 1900 [Urine:1900]  Labs: No results found for this basename: WBC, HGB, HCT, PLT, APTT, INR,  in the last 72 hours   Recent Labs  04/04/13 0540 04/05/13 0607  NA 133* 136  K 3.7 3.8  CL 98 101  CO2 26 26  GLUCOSE 146* 139*  BUN 36* 43*  CREATININE 1.11 1.22  CALCIUM 9.0 9.1  MG  --  2.1  PHOS  --  4.1  PROT  --  6.6  ALBUMIN  --  2.5*  AST  --  25  ALT  --  36  ALKPHOS  --  51  BILITOT  --  0.2*   Estimated Creatinine Clearance: 56.1 ml/min (by C-G formula based on Cr of 1.22).    Recent Labs  04/05/13 1135 04/05/13 1719 04/06/13 0757  GLUCAP 153* 139* 153*    Nutritional Goals:  - RD recommendations: Kcal: 2220-2480,  Protein: 106-124 grams,  Fluid: 2.5 L - Clinimix E 5/20 @ 90 ml/hr + IVF 20% at 10 ml/hr on MWF will provide: 108 g protein and 2107 Kcal on average day  Current nutrition:  - Diet: NPO (tolerating ice chips) - TNA: Clinimix E5/20 at 23ml/hr - mIVF: D5-1/2 NS w/ KCl 40 mEq/L @ kvo  CBGs & Insulin requirements past 24 hours:  - 5 units Novolog SSI/24h  Assessment:  79 yo M with h/o prostate cancer presented 4/27 with c/o L flank and LLQ abd pain. CT with high grade SBO. Surgery on board - conservative management recommended. Pt continues to be NPO since admission.  Surgery ordered for PICC and TNA started 5/2.  Plan diet advancement when  bowel function returns. Ostomy bag filled with gas(air) but no stool, NGT clamped and doing ok on clears.  To check abd xray to see if possible to remove NGT   Labs: Electrolytes: No new labs today Renal Function: Scr WNL with adequate UOP. Has chronic nephrostomy tubes for ureteral stricture.  Hepatic Function: WNL 5/8 Pre-Albumin: 13.6 on 4/30,  10.4 (5/3), 12.4 (5/5) Triglycerides: wnl (5/3), 122 (5/5) CBGs: ~150 - no h/o DM,   Plan:  At 1800 tonight  Continue Clinimix E 5/20 to 90 ml/hr (goal rate)  IV fat emulsion 20%, standard multivitamins and trace elements on MWF only due to ongoing shortage  Continue sensitive SSI, decrease freq to [facility]  TNA labs Monday/Thursdays  Pharmacy will follow up daily  Monitor edema in hands/feet (resolved 5/5)  If TPN likely to be long-term will need to look at cycling TPN.   Follow-up ability to D/C NGT and adv diet  Juliette Alcide, PharmD, BCPS.   Pager: [phone] 04/06/2013 10:13 AM

## 2013-04-06 NOTE — Progress Notes (Signed)
NUTRITION FOLLOW UP  Intervention:   TPN per PharmD; currently meeting 95% of estimated calorie needs and 100% of estimated protein needs Diet advancement per MD discretion   Nutrition Dx:   Inadequate oral intake related to SBO and inability to eat as evidenced by NPO status; ongoing, diet advanced but limited to clear liquids at this time   Goal:   Pt to meet >/= 90% of their estimated nutrition needs; being met   Monitor:   TPN; at goal Bowel function; 5/8 minimal ostomy output Weight; 3 lb wt gain from 4/27 to 5/4 Labs; high BUN, low albumin  Assessment:  Pt asleep at time of visit with TPN running at goal rate. NG tube has been removed. Pt has been drinking clear liquids and tolerating with no complaints of nausea/vomiting well per nursing.   Height: Ht Readings from Last 1 Encounters:  03/25/13 6\' 2"  (1.88 m)    Weight Status:   Wt Readings from Last 1 Encounters:  04/01/13 198 lb 3.2 oz (89.903 kg)    Re-estimated needs:  Kcal: 2220-2480  Protein: 106-124 grams  Fluid: 2.5 L  Skin: intact; non-pitting RUE and LUE edema  Diet Order: Clear Liquid   Intake/Output Summary (Last 24 hours) at 04/06/13 1453 Last data filed at 04/06/13 1423  Gross per 24 hour  Intake 2837.67 ml  Output   2900 ml  Net -62.33 ml    Last BM: 5/8 minimal ostomy output  Labs:   Recent Labs Lab 03/31/13 0420  04/02/13 0320 04/03/13 0504 04/04/13 0540 04/05/13 0607  NA 137  < > 135 134* 133* 136  K 3.6  < > 3.5 3.4* 3.7 3.8  CL 102  < > 99 96 98 101  CO2 27  < > 29 28 26 26   BUN 17  < > 22 29* 36* 43*  CREATININE 1.09  < > 1.02 0.99 1.11 1.22  CALCIUM 8.8  < > 8.8 8.8 9.0 9.1  MG 1.8  --  1.9  --   --  2.1  PHOS 2.8  --  3.5 3.7  --  4.1  GLUCOSE 124*  < > 130* 127* 146* 139*  < > = values in this interval not displayed.  CBG (last 3)   Recent Labs  04/05/13 1719 04/06/13 0757 04/06/13 1305  GLUCAP 139* 153* 121*    Scheduled Meds: . heparin subcutaneous   5,000 Units Subcutaneous Q8H  . insulin aspart  0-9 Units Subcutaneous TID WC  . pantoprazole (PROTONIX) IV  40 mg Intravenous Q12H  . polyethylene glycol  8 g Oral Daily  . tiotropium  18 mcg Inhalation QHS    Continuous Infusions: . Marland KitchenTPN (CLINIMIX-E) Adult 90 mL/hr at 04/05/13 1728  . dextrose 5 % and 0.45 % NaCl with KCl 40 mEq/L 20 mL/hr at 04/05/13 1900  . Marland KitchenTPN (CLINIMIX-E) Adult     And  . fat emulsion      Ian Malkin RD, LDN Inpatient Clinical Dietitian Pager: [phone] After Hours Pager: [phone]

## 2013-04-06 NOTE — Progress Notes (Signed)
General surgery attending note:  Patient interviewed and examined today. I agree with the assessment and treatment plan outlined by Ms. Earl Gala, [place].  The patient has felt well her NG tube clamped for 24 hours. Tolerating clear liquids. Passing lots of flatus but minimal stool. Abdominal exam reveals reducible parastomal hernia, soft and nontender. X-rays showed a moderate amount of air in the colon. Perhaps a little bit of air in the small bowel. Nothing dramatic.  Plan: Discontinue NG tube and continue liquid diet If he opens up... advance diet tomorrow If he re-obstructs he will need surgical intervention. He is aware that this is a possibility also.   Angelia Mould. Derrell Lolling, M.D., [facility] General and Minimally invasive Surgery Breast and Colorectal Surgery Office:   [phone] Pager:   [phone]

## 2013-04-07 LAB — BASIC METABOLIC PANEL
CO2: 25 mEq/L (ref 19–32)
Calcium: 9.1 mg/dL (ref 8.4–10.5)
Chloride: 102 mEq/L (ref 96–112)
Glucose, Bld: 124 mg/dL — ABNORMAL HIGH (ref 70–99)
Sodium: 135 mEq/L (ref 135–145)

## 2013-04-07 LAB — GLUCOSE, CAPILLARY
Glucose-Capillary: 136 mg/dL — ABNORMAL HIGH (ref 70–99)
Glucose-Capillary: 121 mg/dL — ABNORMAL HIGH (ref 70–99)
Glucose-Capillary: 131 mg/dL — ABNORMAL HIGH (ref 70–99)

## 2013-04-07 MED ORDER — M.V.I. ADULT IV INJ
INJECTION | INTRAVENOUS | Status: AC
  Filled 2013-04-07: qty 2200

## 2013-04-07 MED ORDER — OXYCODONE-ACETAMINOPHEN 5-325 MG PO TABS
1.0000 | ORAL_TABLET | Freq: Four times a day (QID) | ORAL | Status: DC | PRN
  Administered 2013-04-07 (×2): 1 via ORAL
  Filled 2013-04-07 (×2): qty 1

## 2013-04-07 MED ORDER — GABAPENTIN 300 MG PO CAPS
300.0000 mg | ORAL_CAPSULE | Freq: Three times a day (TID) | ORAL | Status: DC
  Administered 2013-04-07 – 2013-04-09 (×6): 300 mg via ORAL
  Filled 2013-04-07 (×8): qty 1

## 2013-04-07 MED ORDER — FAT EMULSION 20 % IV EMUL
240.0000 mL | INTRAVENOUS | Status: AC
  Filled 2013-04-07: qty 250

## 2013-04-07 MED ORDER — CLINIMIX E/DEXTROSE (5/20) 5 % IV SOLN
INTRAVENOUS | Status: DC
  Administered 2013-04-07: 18:00:00 via INTRAVENOUS
  Filled 2013-04-07: qty 1000

## 2013-04-07 NOTE — Progress Notes (Signed)
Patient ID: Michael Novak, male   DOB: Oct 04, 1930, 79 y.o.   MRN: [id number] [facility] Surgery Progress Note:   * No surgery found *  Subjective: Mental status is appears clear.  Wanting to go home.  Taking clear liquids and on TNA Objective: Vital signs in last 24 hours: Temp:  [98.3 F (36.8 C)-98.9 F (37.2 C)] 98.4 F (36.9 C) (05/10 0516) Pulse Rate:  [61-83] 61 (05/10 0516) Resp:  [16-18] 16 (05/10 0516) BP: (108-127)/(59-69) 127/59 mmHg (05/10 0516) SpO2:  [93 %-98 %] 98 % (05/10 0516)  Intake/Output from previous day: 05/09 0701 - 05/10 0700 In: 4355.3 [P.O.:980; I.V.:560; TPN:2815.3] Out: 4100 [Urine:4100] Intake/Output this shift:    Physical Exam: Work of breathing is not labored.  Ostomy bag is full of gas.    Lab Results:  Results for orders placed during the hospital encounter of 03/25/13 (from the past 48 hour(s))  GLUCOSE, CAPILLARY     Status: Abnormal   Collection Time    04/05/13 11:35 AM      Result Value Range   Glucose-Capillary 153 (*) 70 - 99 mg/dL  GLUCOSE, CAPILLARY     Status: Abnormal   Collection Time    04/05/13  5:19 PM      Result Value Range   Glucose-Capillary 139 (*) 70 - 99 mg/dL  GLUCOSE, CAPILLARY     Status: Abnormal   Collection Time    04/06/13  7:57 AM      Result Value Range   Glucose-Capillary 153 (*) 70 - 99 mg/dL  GLUCOSE, CAPILLARY     Status: Abnormal   Collection Time    04/06/13  1:05 PM      Result Value Range   Glucose-Capillary 121 (*) 70 - 99 mg/dL  GLUCOSE, CAPILLARY     Status: Abnormal   Collection Time    04/06/13  4:59 PM      Result Value Range   Glucose-Capillary 131 (*) 70 - 99 mg/dL  BASIC METABOLIC PANEL     Status: Abnormal   Collection Time    04/07/13  3:11 AM      Result Value Range   Sodium 135  135 - 145 mEq/L   Potassium 4.5  3.5 - 5.1 mEq/L   Chloride 102  96 - 112 mEq/L   CO2 25  19 - 32 mEq/L   Glucose, Bld 124 (*) 70 - 99 mg/dL   BUN 30 (*) 6 - 23 mg/dL   Creatinine, Ser 4.09   0.50 - 1.35 mg/dL   Calcium 9.1  8.4 - 81.1 mg/dL   GFR calc non Af Amer 64 (*) >90 mL/min   GFR calc Af Amer 74 (*) >90 mL/min   Comment:            The eGFR has been calculated     using the CKD EPI equation.     This calculation has not been     validated in all clinical     situations.     eGFR's persistently     <90 mL/min signify     possible Chronic Kidney Disease.  GLUCOSE, CAPILLARY     Status: Abnormal   Collection Time    04/07/13  7:43 AM      Result Value Range   Glucose-Capillary 136 (*) 70 - 99 mg/dL    Radiology/Results: Ir Nephrostomy Tube Change  04/06/2013  *RADIOLOGY REPORT*  Clinical Data:  History of chronic indwelling right-sided nephroureteral catheter placement via  ileostomy due to right ureteral stricture.  RIGHT-SIDED NEPHROURETERAL CATHETER EXCHANGE  Contrast:  10 ml Omnipaque-300  Fluoroscopy Time: 30 seconds  Procedure:  The procedure, risks, benefits, and alternatives were explained to the patient.  Questions regarding the procedure were encouraged and answered.  The patient understands and consents to the procedure.  The preexisting catheter and surrounding ostomy site was prepped with Betadine in a sterile fashion, and a sterile drape was applied covering the operative field.  A sterile gown and sterile gloves were used for the procedure.  The preexisting catheter was injected with contrast material under fluoroscopy.  It was then removed over a guidewire.  A new 10- French nephroureteral catheter was advanced over a wire.  Final catheter position was confirmed with a fluoroscopic spot image obtained after injection of contrast.  Complications:  None  Findings:  The preexisting catheter retracted slightly into the proximal ureter. A new catheter was placed with the distal portion formed at the level of the right renal pelvis.  IMPRESSION: Routine exchange of chronic indwelling right-sided nephroureteral catheter via ileostomy.   Original Report Authenticated By:  Irish Lack, M.D.    Dg Abd 2 Views  04/06/2013  *RADIOLOGY REPORT*  Clinical Data: Follow up small bowel obstruction  ABDOMEN - 2 VIEW  Comparison: Prior abdominal radiographs 04/05/2013  Findings: Stable bowel gas pattern with air dilating both the cecum and several loops of small bowel in the mid abdomen.  Right percutaneous nephrostomy tube via an ileal ostomy approach remains in stable position.  Partially calcified abdominal aortic aneurysm is unchanged.  No acute osseous abnormality.  No evidence of free air or increased air fluid levels on the lateral decubitus view.  IMPRESSION:  Stable bowel gas pattern.   Original Report Authenticated By: Malachy Moan, M.D.     Anti-infectives: Anti-infectives   None      Assessment/Plan: Problem List: Patient Active Problem List   Diagnosis Date Noted  . SBO (small bowel obstruction) 03/25/2013  . Ureteral stricture, right with chronic nephroureteral stenting  03/25/2013  . Hernia of pelvic floor 03/25/2013  . Rectourethral fistula   . Fall 07/01/2012  . Physical deconditioning 06/28/2012  . AKI (acute kidney injury) 06/28/2012  . H/O colostomy 06/28/2012  . Hydronephrosis, right status post right nephrostomy tube placement 06/21/2012  . AAA (abdominal aortic aneurysm) 06/21/2012  . Leaking abdominal aortic aneurysm 04/11/2012  . Gout 01/25/2012  . Renal failure (ARF), acute on chronic 01/25/2012  . Dehydration 01/25/2012  . Gout attack 12/17/2011  . Pyelonephritis 12/14/2011  . Dyslipidemia 12/14/2011  . 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 Bradycardia, HR 30-50's 11/26/2011  . COPD (chronic obstructive pulmonary disease) 11/26/2011  . Wears dentures/gum disease 05/18/2011  . Prostate cancer 05/18/2011  . Hearing loss 05/18/2011  . Contact lens/glasses fitting 05/18/2011    Would favor advancing diet and weaning TNA.  Observation to assess tolerance of diet.  * No surgery found *    LOS: 13 days   Matt B.  Daphine Deutscher, MD, [facility] [phone] beeper [phone]  04/07/2013 9:53 AM

## 2013-04-07 NOTE — Progress Notes (Signed)
[facility] PROGRESS NOTE  Michael Novak HYQ:[id number] DOB: Dec 22, 1929 DOA: 03/25/2013 PCP: Kimber Relic, MD  Interim summary: 79 y.o. male has a past medical history significant for prostate cancer (urologist is Dr. 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 He denies any fever or chills. He endorses good colostomy output until the night prior to admission, and he reported nausea without vomiting. In the emergency room a CT scan of the abdomen was done which showed a high-grade small bowel obstruction in the lower lateral left pelvis without obvious obstructing cause.An NG was placed and  Surgery was been consulted and have been following pt.So far SBO has been slow to respond to conservative measures and pt has so far not wanted surgical intervention. He was started on TNA on 5/2.   Her sister having increased output into his ostomy. NG tube clamped and started on clears. Assessment/Plan:  Small bowel obstruction  - Surgery was consulted by the ED physician on admission, appreciate their input.  - Conservative management for now, NG tube in place but clamped. Started on clears and - D5 half-normal as maintenance fluids  - phenergan for nausea, pain control - Continue current IV fluids, cr improving 1.37 today (from 1.52 on 4/29) -Followup abdominal x-ray 4/30 with improving bowel gas pattern -Patient still with empty colostomy bag -continue TPN which was started on 5/2 &continuing NG as well -still with increased output. Looks to be improving. Advancing diet & weaning  off TPN.  Home in 1-2 days hopefully  AAA  - stable per imaging   Bradycardia/1st degree heart block - 2 sec pause on monitor overnight 4/28, asymptomatic, in sinus rhythm - cardiology saw him 4/28, no interventions.   Ureteral stricture  - nephrostomy in place, renal function close to baseline (~1.2)  - monitor UOP . Exchange tube done today  COPD  - stable, continue Inhalers when necessary   Hyperlipidemia  - hold oral meds while strict NPO.   Cholelithiasis - asymptomatic  Hydronephrosis - s/p stenting in 2013, good UOP Hypokalemia -Replace k  Monoarthritis, R middle distal DIP With elevated uric acid level of 8.6, and even though per x-ray no definite osseus erosion charateristic of gout observed, this could still be gout, he has a h/o of gout (as per review of his medical records-06/17/11 d/c) -continue prn NSAID/toradol- pt still npo -improved clinically  Fever -Remaining afebrile for several days -chest x-ray, with findings more consistent with atelectasis. Hospitalization.  -Will start on incentive spirometry and follow.  DVT prophylaxis  - heparin s.q.     Code Status: DNR Family Communication: Spoke with his son earlier yesterday  Disposition Plan: Home in 1-2 days  Consultants:  Surgery  Cardiolgogy  Procedures:  none  Antibiotics:  Anti-infectives   None     Antibiotics Given (last 72 hours)   None      Past antibiotics none  HPI/Subjective:  Pt doing well, NG tube out, tolerating clears  Objective: Filed Vitals:   04/06/13 0454 04/06/13 1424 04/06/13 2144 04/07/13 0516  BP: 125/61 108/64 120/69 127/59  Pulse: 58 66 83 61  Temp: 97.8 F (36.6 C)  98.3 F (36.8 C) 98.9 F (37.2 C) 98.4 F (36.9 C)  TempSrc: Oral Oral Oral Oral  Resp: 18 18 16 16   Height:      Weight:      SpO2: 95% 94% 93% 98%    Intake/Output Summary (Last 24 hours) at 04/07/13 1257 Last data filed at 04/07/13 1156  Gross per 24 hour  Intake  3875.33 ml  Output   4150 ml  Net -274.67 ml   Filed Weights   03/25/13 1556 04/01/13 1806  Weight: 88.724 kg (195 lb 9.6 oz) 89.903 kg (198 lb 3.2 oz)    Exam:   General:  NAD  Cardiovascular: regular rate and rhythm, bradycardic  Respiratory: good air movement, clear to auscultation throughout, no wheezing, ronchi or rales  Abdomen: soft, mildly tender to palpation; colostomy-empty and urostomy in place.   Extremities:  Trace pitting edema bilaterally    Data Reviewed: Basic Metabolic Panel:  Recent Labs Lab 04/02/13 0320 04/03/13 0504 04/04/13 0540 04/05/13 0607 04/07/13 0311  NA 135 134* 133* 136 135  K 3.5 3.4* 3.7 3.8 4.5  CL 99 96 98 101 102  CO2 29 28 26 26 25   GLUCOSE 130* 127* 146* 139* 124*  BUN 22 29* 36* 43* 30*  CREATININE 1.02 0.99 1.11 1.22 1.07  CALCIUM 8.8 8.8 9.0 9.1 9.1  MG 1.9  --   --  2.1  --   PHOS 3.5 3.7  --  4.1  --    CBC:  Recent Labs Lab 04/02/13 0320 04/03/13 0504  WBC 5.5 6.2  NEUTROABS 3.1  --   HGB 11.8* 11.8*  HCT 35.3* 35.6*  MCV 88.7 89.0  PLT 195 218   Studies: Dg Abd 2 Views  04/05/2013   IMPRESSION: Single mildly dilated small bowel loop in left mid abdomen. No evidence of bowel wall thickening or perforation. Abdominal aortic aneurysm.   Original Report Authenticated By: Ulyses Southward, M.D.     Scheduled Meds: . heparin subcutaneous  5,000 Units Subcutaneous Q8H  . insulin aspart  0-9 Units Subcutaneous TID WC  . pantoprazole (PROTONIX) IV  40 mg Intravenous Q12H  . polyethylene glycol  8 g Oral Daily  . tiotropium  18 mcg Inhalation QHS   Continuous Infusions: . dextrose 5 % and 0.45 % NaCl with KCl 40 mEq/L 20 mL/hr at 04/05/13 1900  . Marland KitchenTPN (CLINIMIX-E) Adult 60 mL/hr at 04/07/13 1151   And  . fat emulsion    . Marland KitchenTPN (CLINIMIX-E) Adult      Principal Problem:   SBO (small bowel obstruction) Active Problems:   Prostate cancer   Parastomal hernia at left colostomy   COPD (chronic obstructive  pulmonary disease)   AAA (abdominal aortic aneurysm)   H/O colostomy   Rectourethral fistula   Ureteral stricture, right with chronic nephroureteral stenting    Hernia of pelvic floor   Time spent: 20 min  [place] [facility] Pager [phone]. If 7 PM - 7 AM, please contact night-coverage at [email or web address], password [facility] 04/07/2013, 12:57 PM  LOS: 13 days

## 2013-04-07 NOTE — Progress Notes (Signed)
PARENTERAL NUTRITION CONSULT NOTE - Follow Up  Pharmacy Consult for TNA Indication: SBO   Allergies  Allergen Reactions  . Augmentin (Amoxicillin-Pot Clavulanate) Other (See Comments)    Reaction unknown.    Patient Measurements: Height: 6\' 2"  (188 cm) Weight: 198 lb 3.2 oz (89.903 kg) IBW/kg (Calculated) : 82.2 Adjusted Body Weight: 84.2  Vital Signs: Temp: 98.4 F (36.9 C) (05/10 0516) Temp src: Oral (05/10 0516) BP: 127/59 mmHg (05/10 0516) Pulse Rate: 61 (05/10 0516)  Intake/Output from previous day: 05/09 0701 - 05/10 0700 In: 4355.3 [P.O.:980; I.V.:560; TPN:2815.3] Out: 4100 [Urine:4100]  Labs: No results found for this basename: WBC, HGB, HCT, PLT, APTT, INR,  in the last 72 hours   Recent Labs  04/05/13 0607 04/07/13 0311  NA 136 135  K 3.8 4.5  CL 101 102  CO2 26 25  GLUCOSE 139* 124*  BUN 43* 30*  CREATININE 1.22 1.07  CALCIUM 9.1 9.1  MG 2.1  --   PHOS 4.1  --   PROT 6.6  --   ALBUMIN 2.5*  --   AST 25  --   ALT 36  --   ALKPHOS 51  --   BILITOT 0.2*  --    Estimated Creatinine Clearance: 64 ml/min (by C-G formula based on Cr of 1.07).    Recent Labs  04/06/13 1305 04/06/13 1659 04/07/13 0743  GLUCAP 121* 131* 136*    Nutritional Goals:  - RD recommendations: Kcal: 2220-2480,  Protein: 106-124 grams,  Fluid: 2.5 L - Clinimix E 5/20 @ 90 ml/hr + IVF 20% at 10 ml/hr on MWF will provide: 108 g protein and 2107 Kcal on average day  Current nutrition:  - Diet: CLD, started 5/8 - TNA: Clinimix E5/20 at 60ml/hr - mIVF: D5-1/2 NS w/ KCl 40 mEq/L @ kvo  CBGs & Insulin requirements past 24 hours:  - 4 units Novolog SSI/24h  Assessment:  79 yo M with h/o prostate cancer presented 4/27 with c/o L flank and LLQ abd pain. CT with high grade SBO. Surgery on board - conservative management recommended.  Surgery ordered for PICC and TNA started 5/2.  Plan diet advancement when bowel function returns. Patient passing gas but minimal stool, NGT  removed and tolerating clears.  Surgery notes would favor advancing diet and weaning TNA.  Labs: Electrolytes: WNL Renal Function: Scr WNL with adequate UOP. Has chronic nephrostomy tubes for ureteral stricture.  Hepatic Function: WNL 5/8 Pre-Albumin: 13.6 on 4/30,  10.4 (5/3), 12.4 (5/5) Triglycerides: wnl (5/3), 122 (5/5) CBGs: controlled (<150 mg/dL), no h/o DM.   Plan:    Begin wean of TNA.  Reduction of TNA rate should help improve patient's appetite.  Reduce Clinimix E 5/20 to 60 ml/hr now.  At 1800, reduce rate to 40 ml/hr.  Plan to wean to off tomorrow.  IV fat emulsion 20%, standard multivitamins and trace elements on MWF only due to ongoing shortage  Continue sensitive SSI TIDWC  TNA labs Monday/Thursdays  Pharmacy will follow up daily  Clance Boll, PharmD, BCPS Pager: [phone] 04/07/2013 8:31 AM

## 2013-04-08 DIAGNOSIS — K56609 Unspecified intestinal obstruction, unspecified as to partial versus complete obstruction: Secondary | ICD-10-CM

## 2013-04-08 DIAGNOSIS — N360 Urethral fistula: Secondary | ICD-10-CM

## 2013-04-08 DIAGNOSIS — N135 Crossing vessel and stricture of ureter without hydronephrosis: Secondary | ICD-10-CM

## 2013-04-08 LAB — GLUCOSE, CAPILLARY
Glucose-Capillary: 110 mg/dL — ABNORMAL HIGH (ref 70–99)
Glucose-Capillary: 122 mg/dL — ABNORMAL HIGH (ref 70–99)

## 2013-04-08 MED ORDER — PANTOPRAZOLE SODIUM 40 MG PO TBEC
40.0000 mg | DELAYED_RELEASE_TABLET | Freq: Two times a day (BID) | ORAL | Status: DC
  Administered 2013-04-08 – 2013-04-09 (×2): 40 mg via ORAL
  Filled 2013-04-08 (×5): qty 1

## 2013-04-08 MED ORDER — HYDRALAZINE HCL 10 MG PO TABS
10.0000 mg | ORAL_TABLET | Freq: Three times a day (TID) | ORAL | Status: DC
  Administered 2013-04-08 (×2): 10 mg via ORAL
  Filled 2013-04-08 (×6): qty 1

## 2013-04-08 MED ORDER — SIMETHICONE 80 MG PO CHEW
80.0000 mg | CHEWABLE_TABLET | Freq: Four times a day (QID) | ORAL | Status: DC | PRN
  Administered 2013-04-08 – 2013-04-09 (×3): 80 mg via ORAL
  Filled 2013-04-08 (×3): qty 1

## 2013-04-08 NOTE — Progress Notes (Signed)
[facility] PROGRESS NOTE  Michael Novak HYQ:[id number] DOB: 1930-05-26 DOA: 03/25/2013 PCP: Kimber Relic, MD  Interim summary: 79 y.o. male has a past medical history significant for prostate cancer (urologist is Dr. 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 He denies any fever or chills. He endorses good colostomy output until the night prior to admission, and he reported nausea without vomiting. In the emergency room a CT scan of the abdomen was done which showed a high-grade small bowel obstruction in the lower lateral left pelvis without obvious obstructing cause.An NG was placed and  Surgery was been consulted and have been following pt.So far SBO has been slow to respond to conservative measures and pt has so far not wanted surgical intervention. He was started on TNA on 5/2.   Her sister having increased output into his ostomy. NG tube clamped and started on clears. Assessment/Plan:  Small bowel obstruction  - Surgery was consulted by the ED physician on admission, appreciate their input.  - Conservative management for now, tolerating full liquids and change to solid food. Discontinue TPN - D5 half-normal as maintenance fluids  - phenergan for nausea, pain control - Continue current IV fluids, cr improving 1.37 today (from 1.52 on 4/29) .  Home in 1-2 days hopefully  AAA  - stable per imaging   Bradycardia/1st degree heart block - 2 sec pause on monitor overnight 4/28, asymptomatic, in sinus rhythm - cardiology saw him 4/28, no interventions.   Ureteral  stricture  - nephrostomy in place, renal function close to baseline (~1.2)  - monitor UOP . Exchange tube done today  COPD  - stable, continue Inhalers when necessary   Hyperlipidemia  - hold oral meds while strict NPO.   Cholelithiasis - asymptomatic  Hydronephrosis - s/p stenting in 2013, good UOP Hypokalemia -Replace k  Monoarthritis, R middle distal DIP With elevated uric acid level of 8.6, and even though per x-ray no definite osseus erosion charateristic of gout observed, this could still be gout, he has a h/o of gout (as per review of his medical records-06/17/11 d/c) -continue prn NSAID/toradol- pt still npo -improved clinically  Fever -Remaining afebrile for several days -chest x-ray, with findings more consistent with atelectasis. Hospitalization.  -Will start on incentive spirometry and follow.  DVT prophylaxis  - heparin s.q.     Code Status: DNR Family Communication: Left message with family  Disposition Plan: Home hopefully tomorrow  Consultants:  Surgery  Cardiolgogy  Procedures:  none  Antibiotics:  Anti-infectives   None     Antibiotics Given (last 72 hours)   None      Past antibiotics none  HPI/Subjective:  patient sleepy. Otherwise no complaints. Tolerating clear liquids.  Objective: Filed Vitals:   04/07/13 0516 04/07/13 1438 04/07/13 2141 04/08/13 0513  BP: 127/59 136/70 115/63 128/58  Pulse: 61 67 62 54  Temp: 98.4 F (36.9 C) 97.8 F (36.6 C) 98 F (36.7 C) 97.8 F (36.6 C)  TempSrc: Oral Oral Oral Oral  Resp: 16 16 18 17   Height:      Weight:      SpO2: 98% 98% 95% 94%  Intake/Output Summary (Last 24 hours) at 04/08/13 1232 Last data filed at 04/08/13 0700  Gross per 24 hour  Intake 3471.83 ml  Output   2150 ml  Net 1321.83 ml   Filed Weights   03/25/13 1556 04/01/13 1806  Weight: 88.724 kg (195 lb 9.6 oz) 89.903 kg (198 lb 3.2 oz)    Exam:   General:  NAD  Cardiovascular: regular rate and  rhythm, bradycardic  Respiratory: good air movement, clear to auscultation throughout, no wheezing, ronchi or rales  Abdomen: soft, mildly tender to palpation; colostomy and urostomy in place.   Extremities:  Trace pitting edema bilaterally    Data Reviewed: Basic Metabolic Panel:  Recent Labs Lab 04/02/13 0320 04/03/13 0504 04/04/13 0540 04/05/13 0607 04/07/13 0311  NA 135 134* 133* 136 135  K 3.5 3.4* 3.7 3.8 4.5  CL 99 96 98 101 102  CO2 29 28 26 26 25   GLUCOSE 130* 127* 146* 139* 124*  BUN 22 29* 36* 43* 30*  CREATININE 1.02 0.99 1.11 1.22 1.07  CALCIUM 8.8 8.8 9.0 9.1 9.1  MG 1.9  --   --  2.1  --   PHOS 3.5 3.7  --  4.1  --    CBC:  Recent Labs Lab 04/02/13 0320 04/03/13 0504  WBC 5.5 6.2  NEUTROABS 3.1  --   HGB 11.8* 11.8*  HCT 35.3* 35.6*  MCV 88.7 89.0  PLT 195 218   Studies: Dg Abd 2 Views  04/05/2013   IMPRESSION: Single mildly dilated small bowel loop in left mid abdomen. No evidence of bowel wall thickening or perforation. Abdominal aortic aneurysm.   Original Report Authenticated By: Ulyses Southward, M.D.     Scheduled Meds: . gabapentin  300 mg Oral TID  . heparin subcutaneous  5,000 Units Subcutaneous Q8H  . pantoprazole (PROTONIX) IV  40 mg Intravenous Q12H  . polyethylene glycol  8 g Oral Daily  . tiotropium  18 mcg Inhalation QHS   Continuous Infusions: . dextrose 5 % and 0.45 % NaCl with KCl 40 mEq/L 20 mL/hr at 04/07/13 1801  . Marland KitchenTPN (CLINIMIX-E) Adult 40 mL/hr at 04/07/13 1749    Principal Problem:   SBO (small bowel obstruction) Active Problems:   Prostate cancer   Parastomal hernia at left colostomy   COPD (chronic obstructive pulmonary disease)   AAA (abdominal aortic aneurysm)   H/O colostomy   Rectourethral fistula   Ureteral stricture, right with chronic nephroureteral stenting    Hernia of pelvic floor   Time spent: 15 min  [place] [facility] Pager [phone]. If 7 PM - 7 AM, please contact  night-coverage at [email or web address], password [facility] 04/08/2013, 12:32 PM  LOS: 14 days

## 2013-04-08 NOTE — Progress Notes (Signed)
Patient ID: Michael Novak, male   DOB: February 27, 1930, 79 y.o.   MRN: [id number] [facility] Progress Note:   * No surgery found *  Subjective: Mental status is clear Objective: Vital signs in last 24 hours: Temp:  [97.8 F (36.6 C)-98 F (36.7 C)] 97.8 F (36.6 C) (05/11 0513) Pulse Rate:  [54-67] 54 (05/11 0513) Resp:  [16-18] 17 (05/11 0513) BP: (115-136)/(58-70) 128/58 mmHg (05/11 0513) SpO2:  [94 %-98 %] 94 % (05/11 0513)  Intake/Output from previous day: 05/10 0701 - 05/11 0700 In: 3471.8 [P.O.:600; I.V.:640; TPN:2231.8] Out: 2200 [Urine:2150; Stool:50] Intake/Output this shift:    Physical Exam: Work of breathing is not labored.  No complaints of abdominal pain.  Ostomy bag is inflated  Lab Results:  Results for orders placed during the hospital encounter of 03/25/13 (from the past 48 hour(s))  GLUCOSE, CAPILLARY     Status: Abnormal   Collection Time    04/06/13  1:05 PM      Result Value Range   Glucose-Capillary 121 (*) 70 - 99 mg/dL  GLUCOSE, CAPILLARY     Status: Abnormal   Collection Time    04/06/13  4:59 PM      Result Value Range   Glucose-Capillary 131 (*) 70 - 99 mg/dL  BASIC METABOLIC PANEL     Status: Abnormal   Collection Time    04/07/13  3:11 AM      Result Value Range   Sodium 135  135 - 145 mEq/L   Potassium 4.5  3.5 - 5.1 mEq/L   Chloride 102  96 - 112 mEq/L   CO2 25  19 - 32 mEq/L   Glucose, Bld 124 (*) 70 - 99 mg/dL   BUN 30 (*) 6 - 23 mg/dL   Creatinine, Ser 4.09  0.50 - 1.35 mg/dL   Calcium 9.1  8.4 - 81.1 mg/dL   GFR calc non Af Amer 64 (*) >90 mL/min   GFR calc Af Amer 74 (*) >90 mL/min   Comment:            The eGFR has been calculated     using the CKD EPI equation.     This calculation has not been     validated in all clinical     situations.     eGFR's persistently     <90 mL/min signify     possible Chronic Kidney Disease.  GLUCOSE, CAPILLARY     Status: Abnormal   Collection Time    04/07/13  7:43 AM       Result Value Range   Glucose-Capillary 136 (*) 70 - 99 mg/dL  GLUCOSE, CAPILLARY     Status: Abnormal   Collection Time    04/07/13 12:16 PM      Result Value Range   Glucose-Capillary 121 (*) 70 - 99 mg/dL  GLUCOSE, CAPILLARY     Status: Abnormal   Collection Time    04/07/13  4:47 PM      Result Value Range   Glucose-Capillary 131 (*) 70 - 99 mg/dL  GLUCOSE, CAPILLARY     Status: Abnormal   Collection Time    04/08/13  7:29 AM      Result Value Range   Glucose-Capillary 110 (*) 70 - 99 mg/dL    Radiology/Results: Ir Nephrostomy Tube Change  04/06/2013  *RADIOLOGY REPORT*  Clinical Data:  History of chronic indwelling right-sided nephroureteral catheter placement via ileostomy due to right ureteral stricture.  RIGHT-SIDED NEPHROURETERAL CATHETER  EXCHANGE  Contrast:  10 ml Omnipaque-300  Fluoroscopy Time: 30 seconds  Procedure:  The procedure, risks, benefits, and alternatives were explained to the patient.  Questions regarding the procedure were encouraged and answered.  The patient understands and consents to the procedure.  The preexisting catheter and surrounding ostomy site was prepped with Betadine in a sterile fashion, and a sterile drape was applied covering the operative field.  A sterile gown and sterile gloves were used for the procedure.  The preexisting catheter was injected with contrast material under fluoroscopy.  It was then removed over a guidewire.  A new 10- French nephroureteral catheter was advanced over a wire.  Final catheter position was confirmed with a fluoroscopic spot image obtained after injection of contrast.  Complications:  None  Findings:  The preexisting catheter retracted slightly into the proximal ureter. A new catheter was placed with the distal portion formed at the level of the right renal pelvis.  IMPRESSION: Routine exchange of chronic indwelling right-sided nephroureteral catheter via ileostomy.   Original Report Authenticated By: Irish Lack, M.D.     Dg Abd 2 Views  04/06/2013  *RADIOLOGY REPORT*  Clinical Data: Follow up small bowel obstruction  ABDOMEN - 2 VIEW  Comparison: Prior abdominal radiographs 04/05/2013  Findings: Stable bowel gas pattern with air dilating both the cecum and several loops of small bowel in the mid abdomen.  Right percutaneous nephrostomy tube via an ileal ostomy approach remains in stable position.  Partially calcified abdominal aortic aneurysm is unchanged.  No acute osseous abnormality.  No evidence of free air or increased air fluid levels on the lateral decubitus view.  IMPRESSION:  Stable bowel gas pattern.   Original Report Authenticated By: Malachy Moan, M.D.     Anti-infectives: Anti-infectives   None      Assessment/Plan: Problem List: Patient Active Problem List   Diagnosis Date Noted  . SBO (small bowel obstruction) 03/25/2013  . Ureteral stricture, right with chronic nephroureteral stenting  03/25/2013  . Hernia of pelvic floor 03/25/2013  . Rectourethral fistula   . Fall 07/01/2012  . Physical deconditioning 06/28/2012  . AKI (acute kidney injury) 06/28/2012  . H/O colostomy 06/28/2012  . Hydronephrosis, right status post right nephrostomy tube placement 06/21/2012  . AAA (abdominal aortic aneurysm) 06/21/2012  . Leaking abdominal aortic aneurysm 04/11/2012  . Gout 01/25/2012  . Renal failure (ARF), acute on chronic 01/25/2012  . Dehydration 01/25/2012  . Gout attack 12/17/2011  . Pyelonephritis 12/14/2011  . Dyslipidemia 12/14/2011  . 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 Bradycardia, HR 30-50's 11/26/2011  . COPD (chronic obstructive pulmonary disease) 11/26/2011  . Wears dentures/gum disease 05/18/2011  . Prostate cancer 05/18/2011  . Hearing loss 05/18/2011  . Contact lens/glasses fitting 05/18/2011    Weaning TNA and advancing diet.  Improved * No surgery found *    LOS: 14 days   Matt B. Daphine Deutscher, MD, [facility] [phone]  beeper [phone]  04/08/2013 8:58 AM

## 2013-04-08 NOTE — Plan of Care (Signed)
Problem: Phase II Progression Outcomes Goal: IV changed to normal saline lock Outcome: Not Applicable Date Met:  04/08/13 IV discontinued per MD orders

## 2013-04-08 NOTE — Progress Notes (Signed)
PARENTERAL NUTRITION CONSULT NOTE - Follow Up  Pharmacy Consult for TNA Indication: SBO   Allergies  Allergen Reactions  . Augmentin (Amoxicillin-Pot Clavulanate) Other (See Comments)    Reaction unknown.    Patient Measurements: Height: 6\' 2"  (188 cm) Weight: 198 lb 3.2 oz (89.903 kg) IBW/kg (Calculated) : 82.2 Adjusted Body Weight: 84.2  Vital Signs: Temp: 97.8 F (36.6 C) (05/11 0513) Temp src: Oral (05/11 0513) BP: 128/58 mmHg (05/11 0513) Pulse Rate: 54 (05/11 0513)  Intake/Output from previous day: 05/10 0701 - 05/11 0700 In: 3471.8 [P.O.:600; I.V.:640; TPN:2231.8] Out: 2200 [Urine:2150; Stool:50]  Labs: No results found for this basename: WBC, HGB, HCT, PLT, APTT, INR,  in the last 72 hours   Recent Labs  04/07/13 0311  NA 135  K 4.5  CL 102  CO2 25  GLUCOSE 124*  BUN 30*  CREATININE 1.07  CALCIUM 9.1   Estimated Creatinine Clearance: 64 ml/min (by C-G formula based on Cr of 1.07).    Recent Labs  04/07/13 1216 04/07/13 1647 04/08/13 0729  GLUCAP 121* 131* 110*    Nutritional Goals:  - RD recommendations: Kcal: 2220-2480,  Protein: 106-124 grams,  Fluid: 2.5 L - Clinimix E 5/20 @ 90 ml/hr + IVF 20% at 10 ml/hr on MWF will provide: 108 g protein and 2107 Kcal on average day  Current nutrition:  - Diet: FLD, started 5/11 - TNA: Clinimix E5/20 at 36ml/hr - mIVF: D5-1/2 NS w/ KCl 40 mEq/L @ kvo  CBGs & Insulin requirements past 24 hours:  - 3 units Novolog SSI/24h  Assessment:  79 yo M with h/o prostate cancer presented 4/27 with c/o L flank and LLQ abd pain. CT with high grade SBO. Surgery on board - conservative management recommended.  Surgery ordered for PICC and TNA started 5/2.  Plan diet advancement when bowel function returns. Patient passing gas but minimal stool, NGT removed and tolerating clears.  Surgery notes would favor advancing diet and weaning TNA.  Began weaning TNA yesterday with plans to wean to off today.    Labs  (5/10): Electrolytes: WNL Renal Function: Scr WNL with adequate UOP. Has chronic nephrostomy tubes for ureteral stricture.  Hepatic Function: WNL 5/8 Pre-Albumin: 13.6 on 4/30,  10.4 (5/3), 12.4 (5/5) Triglycerides: wnl (5/3), 122 (5/5) CBGs: controlled (<150 mg/dL), no h/o DM.   Plan:    Clinimix E 5/20 currently running at reduced rate of 40 ml/hr.  Continue at reduced rate to finish current bag then TNA off.  If MD wishes to discontinue TNA sooner than 1800, may discontinue TNA at any time without further wean.  Clance Boll, PharmD, BCPS Pager: [phone] 04/08/2013 8:46 AM

## 2013-04-09 DIAGNOSIS — M109 Gout, unspecified: Secondary | ICD-10-CM

## 2013-04-09 MED ORDER — OXYCODONE HCL 5 MG PO CAPS
5.0000 mg | ORAL_CAPSULE | ORAL | Status: DC | PRN
Start: 2013-04-09 — End: 2013-05-09

## 2013-04-09 NOTE — Progress Notes (Signed)
Agree with A&P of EW,PA He feels well and ready to go. Abdomen is soft, benign

## 2013-04-09 NOTE — Discharge Summary (Addendum)
Physician Discharge Summary  Michael Novak:[id number] DOB: 1929-12-17 DOA: 03/25/2013  PCP: Michael Relic, MD  Admit date: 03/25/2013 Discharge date: 04/09/2013  Time spent: 35 minutes  Recommendations for Outpatient Follow-up:  1. Patient will followup with his primary care physician in 2-4 weeks 2. He will follow up with general surgery as needed  Discharge Diagnoses:  Principal Problem:   SBO (small bowel obstruction) Active Problems:   Prostate cancer   Parastomal hernia at left colostomy   COPD (chronic obstructive pulmonary disease)   AAA (abdominal aortic aneurysm)   H/O colostomy   Rectourethral fistula   Ureteral stricture, right with chronic nephroureteral stenting    Hernia of pelvic floor   Discharge Condition: Improved, being discharged to home  Diet recommendation: General, low residue  [facility] Weights   03/25/13 1556 04/01/13 1806  Weight: 88.724 kg (195 lb 9.6 oz) 89.903 kg (198 lb 3.2 oz)    History of present illness:  On 4/27:Michael Novak is a 79 y.o. male has a past medical history significant for prostate cancer (urologist is Dr. Logan Novak) s/p radioactive seed implantation, complicated by prostatic rectal fistula status post cystoprostatectomy with ileal conduit as well as proctocolectomy with colostomy, as well as right-sided nephrostomy placed in July of 2013 (exchanged every 6 weeks), abdominal aortic aneurysm followed by vascular surgery as an outpatient with a stable appearance on repeat imaging, COPD, presents with a chief complaint of left flank and left lower quadrant abdominal pain that started yesterday. He denies any fever or chills. He endorses good colostomy output up until last night. He endorses nausea without vomiting. He has no chest pain, no breathing difficulties, he denies lightheadedness or dizziness. In the emergency room a CT scan of the abdomen was done which showed a high-grade small bowel obstruction in the lower lateral left pelvis  without obvious obstructing cause. Surgery has been consulted, and due to multiple medical problems hospitalist service was asked for admission. Patient currently is in no acute distress, has an NG tube in place feels much more comfortable than when he came in. He denies any complaints and states that his abdominal pain is better in the ED.    Hospital Course:  Principal Problem:   SBO (small bowel obstruction): General surgery was consulted. Patient's had this before. His for surgery in the past and has declined. Was put on conservative management and had a very prolonged course for recovery. Symptomatic control of pain and nausea. NG tube had to be placed in the emergency room. Patient was able to have his colostomy irrigated and increased his ambulation which seemed to have helped. He had his NG clamped on 5/8 and tolerated this. He was then started on clear liquids which she tolerated and the NG tube was removed. Diet was advanced and by the evening of 5/11, patient was tolerating by mouth. He has remained stable and continues to tolerate solid food and will be discharged on 5/12 on a low residue diet. Active Problems:   Prostate cancer: Stable   Monoarthritis of the right distal DIPs: Patient was having a low-grade fever for a few days. On 5/6, hospitalists reviewed previous record and found he had a history of gout. Check serum uric acid level noted to be elevated at 8.6. X-ray done showing no definitive osseous erosions, symptoms are characteristic of gout as he did have little bit of pain and swelling on the right middle and distal DIPs. He was given some IV Toradol as he was  still n.p.o. at this point and his symptoms resolved.  Bradycardia/first degree heart block: Patient is November 2 second pause on the night after admission. He is asymptomatic from this and has been in sinus rhythm. Cardiology was asked to see him. Dr. Katrinka Blazing from [facility] cardiology felt that he may have some sinus node  dysfunction and perhaps an over arching vasovagal component given his small bowel obstruction and n.p.o. status. He felt no evaluation was required unless pauses occurred greater than 3 seconds or he started being symptomatic. Patient in the interim heart rate has been in the 70s and remains asymptomatic.    COPD (chronic obstructive pulmonary disease): Stable medical issue during this hospitalization    AAA (abdominal aortic aneurysm): Stable per imaging    H/O colostomy    Ureteral stricture, right with chronic nephroureteral stenting : Status post nephrostomy done before this admission. Renal function remained stable close to baseline around 1.2. Interventional radiology to patient on 5/11 for exchange tube which was done without complication    Hernia of pelvic floor  Hypokalemia: Secondary to volume losses. Potassium replace.   Procedures:  None  Consultations:  General surgery-[facility] cardiology-[facility]  Discharge Exam: Filed Vitals:   04/08/13 1705 04/08/13 2206 04/09/13 0535 04/09/13 0609  BP: 123/74 112/80 106/65 135/72  Pulse: 75 84 61 87  Temp:  98.7 F (37.1 C) 97.7 F (36.5 C) 98.2 F (36.8 C)  TempSrc:  Oral Oral Oral  Resp:  20 18 18   Height:      Weight:      SpO2:  98% 92% 94%    General: Alert and oriented x3, no acute distress, tolerating solid foods Cardiovascular: Regular rate and rhythm, S1-S2 Respiratory: Clear to auscultation bilaterally Abdomen: Soft, nontender, no ostomy-pink Extremities: No clubbing or cyanosis, trace edema  Discharge Instructions  Discharge Orders   Future Appointments Provider Department Dept Phone   04/24/2013 9:00 AM [facility] [phone]   Eat a light meal the night before the exam but please avoid gaseous foods.   Nothing to eat or drink for at least 8 hours prior to the exam. No gum chewing or smoking the morning of the exam. Please take your morning  medications with small sips of water, especially blood pressure medication. If you have several vascular lab exams and will see physician, please bring a snack with you.   04/24/2013 9:40 AM Evern Bio, NP [facility] [phone]   05/17/2013 11:30 AM [facility] [phone]   08/14/2013 10:45 AM Claudie Revering, NP [facility] [phone]   Future Orders Complete By Expires     Diet general  As directed     Comments:      Low residue    Increase activity slowly  As directed         Medication List    STOP taking these medications       ciprofloxacin 500 MG tablet  Commonly known as:  CIPRO      TAKE these medications       acetaminophen 500 MG tablet  Commonly known as:  TYLENOL  Take 1,000 mg by mouth every 6 (six) hours as needed. For pain     albuterol 108 (90 BASE) MCG/ACT inhaler  Commonly known as:  PROVENTIL HFA;VENTOLIN HFA  Inhale 2 puffs into the lungs every 4 (four) hours as needed for shortness of breath.  aspirin EC 81 MG tablet  Take 81 mg by mouth at bedtime.     buPROPion 150 MG 12 hr tablet  Commonly known as:  WELLBUTRIN SR  Take 150 mg by mouth 2 (two) times daily.     CALCIUM MAGNESIUM PO  Take 1 tablet by mouth every morning.     gabapentin 300 MG capsule  Commonly known as:  NEURONTIN  Take 300 mg by mouth 3 (three) times daily.     glucosamine-chondroitin 500-400 MG tablet  Take 1 tablet by mouth 2 (two) times daily.     hydrophilic ointment  Apply 1 application topically daily as needed. Applies to feet.     ketoconazole 2 % shampoo  Commonly known as:  NIZORAL  Apply 1 application topically 2 (two) times a week.     methenamine 1 G tablet  Commonly known as:  HIPREX  Take 1 g by mouth daily as needed. Taken if patient has kidney infection. Alternating with Cipro.     multivitamin with minerals Tabs  Take 1 tablet by mouth every  morning.     nitrofurantoin 50 MG capsule  Commonly known as:  MACRODANTIN  Take 50 mg by mouth at bedtime.     oxycodone 5 MG capsule  Commonly known as:  OXY-IR  Take 1 capsule (5 mg total) by mouth every 4 (four) hours as needed.     selenium sulfide 2.5 % shampoo  Commonly known as:  SELSUN  Apply 1 application topically daily as needed (Applies to scalp for dandruff or itching.).     simvastatin 20 MG tablet  Commonly known as:  ZOCOR  Take 20 mg by mouth at bedtime.     sodium chloride 0.65 % nasal spray  Commonly known as:  OCEAN  Place 1 spray into the nose daily as needed. For nasal decongestant     sorbitol 70 % solution  Take 15-30 mLs by mouth daily as needed. For constipation     tiotropium 18 MCG inhalation capsule  Commonly known as:  SPIRIVA  Place 18 mcg into inhaler and inhale at bedtime.     vitamin C 500 MG tablet  Commonly known as:  ASCORBIC ACID  Take 500 mg by mouth daily.       Allergies  Allergen Reactions  . Augmentin (Amoxicillin-Pot Clavulanate) Other (See Comments)    Reaction unknown.       Follow-up Information   Follow up with GREEN, Lenon Curt, MD. Schedule an appointment as soon as possible for a visit in 3 weeks.   Contact information:   [address] Jeanella Anton [place] [address] [phone]       Call Ernestene Mention, MD. (General Surgery As needed)    Contact information:   [address] [place] [address] [phone]        The results of significant diagnostics from this hospitalization (including imaging, microbiology, ancillary and laboratory) are listed below for reference.    Significant Diagnostic Studies: Dg Abd 1 View  03/31/2013    IMPRESSION: NG tube loops upon itself in the distal esophagus and does not enter the stomach.  Diffuse gaseous distention of large and small bowel in the upper abdomen.   Original Report Authenticated By: Kennith Center, M.D.    Ct Abdomen Pelvis W  Contrast  03/25/2013    IMPRESSION: High-grade small bowel obstruction with transition point in the low lateral left pelvis without obstructing cause identified - question adhesion.  No  evidence of pneumoperitoneum or free fluid.  Moderate bilateral hydronephrosis with right urinary stent as described.  Mild wall enhancement of the right renal pelvis and ureter may represent inflammation/infection.  Unchanged 4.7 x 5 cm infrarenal suprailiac abdominal aortic aneurysm.  Unchanged large left pelvic wall hernia containing colon.  Emphysema, bilateral lower lung scarring and coronary artery disease.   Original Report Authenticated By: Harmon Pier, M.D.    Dg Hand 2 View Right  03/29/2013    IMPRESSION:  1.  Soft tissue swelling in the wrist and hand, especially involving the middle finger.  No definite osseous erosion characteristic of gout observed. 2.  Severe degenerative findings at the first carpometacarpal articulation, with scattered degenerative spurring elsewhere in the hand.   Original Report Authenticated By: Gaylyn Rong, M.D.    Ir Nephrostomy Tube Change  04/06/2013   IMPRESSION: Routine exchange of chronic indwelling right-sided nephroureteral catheter via ileostomy.   Original Report Authenticated By: Irish Lack, M.D.    Dg Chest Port 1 View  04/02/2013   IMPRESSION:  Left retrocardiac opacity may reflect atelectasis and / or infiltrate. Atelectasis is favored.  The tip of a right upper extremity approach PICC projects over the mid superior vena cava   Original Report Authenticated By: Malachy Moan, M.D.    Dg Abd 2 Views  04/06/2013    IMPRESSION:  Stable bowel gas pattern.   Original Report Authenticated By: Malachy Moan, M.D.    Dg Abd 2 Views  04/05/2013    IMPRESSION: Single mildly dilated small bowel loop in left mid abdomen. No evidence of bowel wall thickening or perforation. Abdominal aortic aneurysm.   Original Report Authenticated By: Ulyses Southward, M.D.    Dg Abd 2  Views  04/02/2013    IMPRESSION: Marked improvement in small bowel obstruction.   Original Report Authenticated By: Holley Dexter, M.D.    Dg Abd Portable 2v  03/28/2013    IMPRESSION: Improving bowel gas pattern with probable resolving small bowel obstruction.   Original Report Authenticated By: Rudie Meyer, M.D.    Dg Abd Portable 2v  03/26/2013 IMPRESSION: Continued mid abdominal small bowel dilatation compatible with small bowel obstruction.  No real change since prior CT.  NG tube in the stomach.   Original Report Authenticated By: Charlett Nose, M.D.      Labs: Basic Metabolic Panel:  Recent Labs Lab 04/03/13 0504 04/04/13 0540 04/05/13 0607 04/07/13 0311  NA 134* 133* 136 135  K 3.4* 3.7 3.8 4.5  CL 96 98 101 102  CO2 28 26 26 25   GLUCOSE 127* 146* 139* 124*  BUN 29* 36* 43* 30*  CREATININE 0.99 1.11 1.22 1.07  CALCIUM 8.8 9.0 9.1 9.1  MG  --   --  2.1  --   PHOS 3.7  --  4.1  --    Liver Function Tests:  Recent Labs Lab 04/05/13 0607  AST 25  ALT 36  ALKPHOS 51  BILITOT 0.2*  PROT 6.6  ALBUMIN 2.5*   CBC:  Recent Labs Lab 04/03/13 0504  WBC 6.2  HGB 11.8*  HCT 35.6*  MCV 89.0  PLT 218   CBG:  Recent Labs Lab 04/07/13 0743 04/07/13 1216 04/07/13 1647 04/08/13 0729 04/08/13 1205  GLUCAP 136* 121* 131* 110* 122*       Signed:  Kemond Amorin K  [facility] 04/09/2013, 10:33 AM

## 2013-04-09 NOTE — Progress Notes (Signed)
Patient ID: Michael Novak, male   DOB: 13-Apr-1930, 79 y.o.   MRN: [id number] [facility] Progress Note:    Subjective: No complaints, ready to go home, denies n/v, +flatus in bag and BM  Objective: Vital signs in last 24 hours: Temp:  [97.2 F (36.2 C)-98.7 F (37.1 C)] 98.2 F (36.8 C) (05/12 0609) Pulse Rate:  [61-87] 87 (05/12 0609) Resp:  [18-20] 18 (05/12 0609) BP: (106-135)/(56-80) 135/72 mmHg (05/12 0609) SpO2:  [92 %-98 %] 94 % (05/12 0609)  Intake/Output from previous day: 05/11 0701 - 05/12 0700 In: 720 [P.O.:360; TPN:360] Out: 2200 [Urine:2050; Stool:150] Intake/Output this shift:    Physical Exam:  Abd: soft, nontender, +gas in bag, solid stool in bag, +BS General: NAD   Lab Results:  Results for orders placed during the hospital encounter of 03/25/13 (from the past 48 hour(s))  GLUCOSE, CAPILLARY     Status: Abnormal   Collection Time    04/07/13 12:16 PM      Result Value Range   Glucose-Capillary 121 (*) 70 - 99 mg/dL  GLUCOSE, CAPILLARY     Status: Abnormal   Collection Time    04/07/13  4:47 PM      Result Value Range   Glucose-Capillary 131 (*) 70 - 99 mg/dL  GLUCOSE, CAPILLARY     Status: Abnormal   Collection Time    04/08/13  7:29 AM      Result Value Range   Glucose-Capillary 110 (*) 70 - 99 mg/dL  GLUCOSE, CAPILLARY     Status: Abnormal   Collection Time    04/08/13 12:05 PM      Result Value Range   Glucose-Capillary 122 (*) 70 - 99 mg/dL    Radiology/Results: No results found.  Anti-infectives: Anti-infectives   None      Assessment/Plan: Problem List: Patient Active Problem List   Diagnosis Date Noted  . SBO (small bowel obstruction) 03/25/2013  . Ureteral stricture, right with chronic nephroureteral stenting  03/25/2013  . Hernia of pelvic floor 03/25/2013  . Rectourethral fistula   . Fall 07/01/2012  . Physical deconditioning 06/28/2012  . AKI (acute kidney injury) 06/28/2012  . H/O colostomy 06/28/2012   . Hydronephrosis, right status post right nephrostomy tube placement 06/21/2012  . AAA (abdominal aortic aneurysm) 06/21/2012  . Leaking abdominal aortic aneurysm 04/11/2012  . Gout 01/25/2012  . Renal failure (ARF), acute on chronic 01/25/2012  . Dehydration 01/25/2012  . Gout attack 12/17/2011  . Pyelonephritis 12/14/2011  . Dyslipidemia 12/14/2011  . 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 Bradycardia, HR 30-50's 11/26/2011  . COPD (chronic obstructive pulmonary disease) 11/26/2011  . Wears dentures/gum disease 05/18/2011  . Prostate cancer 05/18/2011  . Hearing loss 05/18/2011  . Contact lens/glasses fitting 05/18/2011     Resolved SBO Regular diet but low residue Ok to discharge home from surgical standpoint F/u PRN Spoke with son and he knows to call with any questions or concerns and he has our number   LOS: 15 days  Ruben Mahler, [facility] [phone]  04/09/2013 10:13 AM

## 2013-04-10 ENCOUNTER — Other Ambulatory Visit (HOSPITAL_COMMUNITY): Payer: Self-pay | Admitting: Urology

## 2013-04-24 ENCOUNTER — Ambulatory Visit: Payer: Medicare Other | Admitting: Neurosurgery

## 2013-04-24 ENCOUNTER — Encounter (INDEPENDENT_AMBULATORY_CARE_PROVIDER_SITE_OTHER): Payer: Medicare Other | Admitting: *Deleted

## 2013-04-24 DIAGNOSIS — I714 Abdominal aortic aneurysm, without rupture: Secondary | ICD-10-CM

## 2013-04-25 ENCOUNTER — Other Ambulatory Visit: Payer: Self-pay | Admitting: *Deleted

## 2013-04-25 DIAGNOSIS — I714 Abdominal aortic aneurysm, without rupture: Secondary | ICD-10-CM

## 2013-04-27 ENCOUNTER — Encounter: Payer: Self-pay | Admitting: Vascular Surgery

## 2013-05-09 ENCOUNTER — Ambulatory Visit (INDEPENDENT_AMBULATORY_CARE_PROVIDER_SITE_OTHER): Payer: Medicare Other | Admitting: Internal Medicine

## 2013-05-09 ENCOUNTER — Encounter: Payer: Self-pay | Admitting: *Deleted

## 2013-05-09 ENCOUNTER — Encounter: Payer: Self-pay | Admitting: Internal Medicine

## 2013-05-09 ENCOUNTER — Other Ambulatory Visit: Payer: Self-pay | Admitting: *Deleted

## 2013-05-09 VITALS — BP 112/70 | HR 57 | Temp 97.7°F | Resp 16 | Ht 74.0 in | Wt 194.0 lb

## 2013-05-09 DIAGNOSIS — IMO0002 Reserved for concepts with insufficient information to code with codable children: Secondary | ICD-10-CM

## 2013-05-09 DIAGNOSIS — R634 Abnormal weight loss: Secondary | ICD-10-CM

## 2013-05-09 DIAGNOSIS — M109 Gout, unspecified: Secondary | ICD-10-CM

## 2013-05-09 DIAGNOSIS — R5381 Other malaise: Secondary | ICD-10-CM

## 2013-05-09 DIAGNOSIS — N135 Crossing vessel and stricture of ureter without hydronephrosis: Secondary | ICD-10-CM

## 2013-05-09 DIAGNOSIS — D649 Anemia, unspecified: Secondary | ICD-10-CM

## 2013-05-09 DIAGNOSIS — K56609 Unspecified intestinal obstruction, unspecified as to partial versus complete obstruction: Secondary | ICD-10-CM

## 2013-05-09 DIAGNOSIS — K9409 Other complications of colostomy: Secondary | ICD-10-CM

## 2013-05-09 MED ORDER — SORBITOL 70 % PO SOLN: 15.0000 mL | Freq: Every day | ORAL | Status: DC | PRN

## 2013-05-09 NOTE — Patient Instructions (Signed)
Nutritional consult will be scheduled. Try the low residue diet.

## 2013-05-17 ENCOUNTER — Ambulatory Visit (HOSPITAL_COMMUNITY)
Admit: 2013-05-17 | Discharge: 2013-05-17 | Disposition: A | Discharge status: Home / Self Care | Payer: Medicare Other | Attending: Urology | Admitting: Urology

## 2013-05-17 ENCOUNTER — Other Ambulatory Visit (HOSPITAL_COMMUNITY): Payer: Self-pay | Admitting: Interventional Radiology

## 2013-05-17 DIAGNOSIS — Z466 Encounter for fitting and adjustment of urinary device: Secondary | ICD-10-CM | POA: Insufficient documentation

## 2013-05-17 DIAGNOSIS — N1330 Unspecified hydronephrosis: Secondary | ICD-10-CM

## 2013-05-17 DIAGNOSIS — N135 Crossing vessel and stricture of ureter without hydronephrosis: Secondary | ICD-10-CM | POA: Insufficient documentation

## 2013-05-17 DIAGNOSIS — Z8546 Personal history of malignant neoplasm of prostate: Secondary | ICD-10-CM

## 2013-05-17 MED ORDER — IOHEXOL 300 MG/ML  SOLN
25.0000 mL | Freq: Once | INTRAMUSCULAR | Status: AC | PRN
  Administered 2013-05-17: 25 mL

## 2013-06-27 ENCOUNTER — Ambulatory Visit (INDEPENDENT_AMBULATORY_CARE_PROVIDER_SITE_OTHER): Payer: Medicare Other | Admitting: Internal Medicine

## 2013-06-27 ENCOUNTER — Encounter: Payer: Self-pay | Admitting: Internal Medicine

## 2013-06-27 VITALS — BP 90/60 | HR 61 | Temp 97.7°F | Resp 18 | Ht 74.0 in | Wt 193.0 lb

## 2013-06-27 DIAGNOSIS — D649 Anemia, unspecified: Secondary | ICD-10-CM

## 2013-06-27 DIAGNOSIS — IMO0002 Reserved for concepts with insufficient information to code with codable children: Secondary | ICD-10-CM

## 2013-06-27 DIAGNOSIS — R42 Dizziness and giddiness: Secondary | ICD-10-CM

## 2013-06-27 DIAGNOSIS — R634 Abnormal weight loss: Secondary | ICD-10-CM

## 2013-06-27 DIAGNOSIS — E785 Hyperlipidemia, unspecified: Secondary | ICD-10-CM | POA: Insufficient documentation

## 2013-06-27 DIAGNOSIS — Z8719 Personal history of other diseases of the digestive system: Secondary | ICD-10-CM

## 2013-06-27 NOTE — Patient Instructions (Signed)
The most likely cause of your lightheaded feeling is benign paroxysmal postural vertigo. Try the home exercises.  Continue your current medication.

## 2013-06-27 NOTE — Progress Notes (Signed)
Subjective:    Patient ID: Michael Novak, male    DOB: Feb 25, 1930, 79 y.o.   MRN: [id number]  HPI Lightheaded feeling since he was in the hospital in April 2014. BP seems to be good. Says he can get the feeling when sitting still. Turning around can make him lightheaded to the point he has to reach and hold something. Lateral head movements are a bigger problem. No nausea or palpitations. Only lasts a few seconds.   Seeing a chiropractor about low back pain. Started about a month ago. No injury. History of disc deterioration. Has subluxation according to chiropractor.  Had an "attack" on his neuropathy a few nights ago. Took extra Neurontin and that seemed to help.    Current Outpatient Prescriptions on File Prior to Visit  Medication Sig Dispense Refill  . acetaminophen (TYLENOL) 500 MG tablet Take 1,000 mg by mouth every 6 (six) hours as needed. For pain      . albuterol (PROVENTIL HFA;VENTOLIN HFA) 108 (90 BASE) MCG/ACT inhaler Inhale 2 puffs into the lungs every 4 (four) hours as needed for shortness of breath.       Marland Kitchen aspirin EC 81 MG tablet Take 81 mg by mouth at bedtime.       Marland Kitchen buPROPion (WELLBUTRIN SR) 150 MG 12 hr tablet Take 150 mg by mouth 3 (three) times daily.       . Calcium-Magnesium-Vitamin D (CALCIUM MAGNESIUM PO) Take 1 tablet by mouth every morning.       . gabapentin (NEURONTIN) 300 MG capsule Take 300 mg by mouth 3 (three) times daily. Take 1 tablet twice daily  And 2 tablets at bedtime to treat neuropathy.      Marland Kitchen glucosamine-chondroitin 500-400 MG tablet Take 1 tablet by mouth 2 (two) times daily.       Marland Kitchen ketoconazole (NIZORAL) 2 % shampoo Apply 1 application topically 2 (two) times a week.       . methenamine (HIPREX) 1 G tablet Take 1 g by mouth daily as needed. Taken if patient has kidney infection. Alternating with Cipro.      . Multiple Vitamin (MULTIVITAMIN WITH MINERALS) TABS Take 1 tablet by mouth every morning.       . nitrofurantoin (MACRODANTIN) 50 MG  capsule Take 50 mg by mouth at bedtime.      . selenium sulfide (SELSUN) 2.5 % shampoo Apply 1 application topically daily as needed (Applies to scalp for dandruff or itching.).       Marland Kitchen simvastatin (ZOCOR) 20 MG tablet Take 20 mg by mouth at bedtime.        . sodium chloride (OCEAN) 0.65 % nasal spray Place 1 spray into the nose daily as needed. For nasal decongestant      . sorbitol 70 % solution Take 15-30 mLs by mouth daily as needed. For constipation  473 mL  5  . tiotropium (SPIRIVA) 18 MCG inhalation capsule Place 18 mcg into inhaler and inhale at bedtime.      . vitamin C (ASCORBIC ACID) 500 MG tablet Take 500 mg by mouth daily.       No current facility-administered medications on file prior to visit.    Review of Systems  Constitutional: Positive for fatigue. Negative for fever, chills and unexpected weight change.  HENT: Negative.   Eyes: Negative.   Respiratory: Negative.   Cardiovascular: Negative for chest pain, palpitations and leg swelling.  Gastrointestinal: Negative for abdominal pain and abdominal distention.  Colostomy. Pericolostomy ventral hernia.  Endocrine: Negative.   Genitourinary: Positive for urgency and frequency.  Musculoskeletal: Positive for back pain and arthralgias.  Skin: Negative.   Allergic/Immunologic: Negative.   Neurological: Positive for dizziness.  Hematological:       History of anemia.  Psychiatric/Behavioral: Negative.        Objective:BP 90/60  Pulse 61  Temp(Src) 97.7 F (36.5 C) (Oral)  Resp 18  Ht 6\' 2"  (1.88 m)  Wt 193 lb (87.544 kg)  BMI 24.77 kg/m2  SpO2 93%    Physical Exam  Constitutional: He is oriented to person, place, and time. He appears well-nourished. No distress.  HENT:  Head: Normocephalic.  Right Ear: External ear normal.  Left Ear: External ear normal.  Nose: Nose normal.  Eyes: Conjunctivae and EOM are normal. Pupils are equal, round, and reactive to light.  Neck: Normal range of motion. Neck  supple. No JVD present. No tracheal deviation present. No thyromegaly present.  Cardiovascular: Normal rate, regular rhythm, normal heart sounds and intact distal pulses.  Exam reveals no gallop and no friction rub.   No murmur heard. Pulmonary/Chest: Breath sounds normal. No respiratory distress. He has no wheezes. He has no rales. He exhibits no tenderness.  Abdominal: Soft. Bowel sounds are normal. He exhibits no distension and no mass. There is no tenderness.  Colostomy left lower quadrant.. Colostomy ventral hernia.  Musculoskeletal: Normal range of motion. He exhibits no edema and no tenderness.  Lymphadenopathy:    He has no cervical adenopathy.  Neurological: He is alert and oriented to person, place, and time. No cranial nerve deficit. Coordination normal.  Skin: No rash noted. No erythema. No pallor.  Psychiatric: He has a normal mood and affect. His behavior is normal. Judgment and thought content normal.         Assessment & Plan:   Dizzy: Symptoms are compatible with benign paroxysmal postural vertigo, but other possible etiologies cannot ruled out at this time  Loss of weight: Gain 1 pounds since last office visit  Other and unspecified hyperlipidemia: Needs further followup with lab work  Anemia, unspecified: Needs further followup with lab work  H/O colostomy: Colostomy functioning well. Pericolostomy ventral hernia is a nuisance to the patient, but is not considered a good surgical candidate.

## 2013-06-28 ENCOUNTER — Ambulatory Visit (HOSPITAL_COMMUNITY)
Admission: RE | Admit: 2013-06-28 | Discharge: 2013-06-28 | Disposition: A | Discharge status: Home / Self Care | Payer: Medicare Other | Source: Ambulatory Visit | Attending: Interventional Radiology | Admitting: Interventional Radiology

## 2013-06-28 ENCOUNTER — Other Ambulatory Visit (HOSPITAL_COMMUNITY): Payer: Self-pay | Admitting: Interventional Radiology

## 2013-06-28 DIAGNOSIS — Z8546 Personal history of malignant neoplasm of prostate: Secondary | ICD-10-CM

## 2013-06-28 DIAGNOSIS — N1330 Unspecified hydronephrosis: Secondary | ICD-10-CM

## 2013-06-28 DIAGNOSIS — Z436 Encounter for attention to other artificial openings of urinary tract: Secondary | ICD-10-CM | POA: Insufficient documentation

## 2013-06-28 MED ORDER — IOHEXOL 300 MG/ML  SOLN
25.0000 mL | Freq: Once | INTRAMUSCULAR | Status: AC | PRN
  Administered 2013-06-28: 1 mL

## 2013-06-28 NOTE — Procedures (Signed)
Successful right sided retrograde NUS exchange.  No immediate complications.  

## 2013-07-24 ENCOUNTER — Ambulatory Visit: Payer: Medicare Other | Admitting: Dietician

## 2013-07-26 NOTE — Progress Notes (Signed)
Subjective:    Patient ID: Michael Novak, male    DOB: [birth year], 79 y.o.   MRN: [id number]  HPI Patient is here in followup of a hospitalization 03/25/2013 through 04/09/2013. The proximal problem was a small bowel obstruction. Other active problems including prostate cancer, parastomal hernia and left colostomy, COPD, abdominal aortic aneurysm, rectourethral fistula, ureteral stricture on the right with chronic nephroureteral stenting, and hernia the pelvic floor.  Patient presented with nausea and vomiting. CT scan showed a high-grade small bowel obstruction the lower lateral left pelvis. Surgeon, Dr. Michaell Cowing, was consulted. NG tube was placed and he was put on conservative management. He had a prolonged course for recovery. Ultimately the NG tube was clamped and he tolerated this and then a clear liquid diet. He was able to tolerate solid food Apr 08, 2013 and was discharged 04/09/2013.  During his hospital stay he had monoarthritis of the right distal interphalangeal joints. Uric acid was elevated at 8.6. He was given intravenous Toradol and symptoms resolved.  He also had bradycardia/first degree heart block and cardiology was consulted. Dr. Garnette Scheuermann responded. He said he might have some sinus node dysfunction and perhaps overarching vasovagal component associated with a small bowel obstruction. No further evaluation was required, in his opinion, unless pauses occurred greater than 3 seconds or he became symptomatic. For the rest of his hospital stay his heart rate remained in the 70s and he was asymptomatic.  Patient has lost about 34 pounds course of this hospitalization and illness. He says he was advised to follow a low-residue diet.  Current Outpatient Prescriptions on File Prior to Visit  Medication Sig Dispense Refill  . acetaminophen (TYLENOL) 500 MG tablet Take 1,000 mg by mouth every 6 (six) hours as needed. For pain      . albuterol (PROVENTIL HFA;VENTOLIN HFA) 108 (90 BASE)  MCG/ACT inhaler Inhale 2 puffs into the lungs every 4 (four) hours as needed for shortness of breath.       Marland Kitchen aspirin EC 81 MG tablet Take 81 mg by mouth at bedtime.       Marland Kitchen buPROPion (WELLBUTRIN SR) 150 MG 12 hr tablet Take 150 mg by mouth 3 (three) times daily.       . Calcium-Magnesium-Vitamin D (CALCIUM MAGNESIUM PO) Take 1 tablet by mouth every morning.       . gabapentin (NEURONTIN) 300 MG capsule Take 300 mg by mouth 3 (three) times daily. Take 1 tablet twice daily  And 2 tablets at bedtime to treat neuropathy.      Marland Kitchen glucosamine-chondroitin 500-400 MG tablet Take 1 tablet by mouth 2 (two) times daily.       Marland Kitchen ketoconazole (NIZORAL) 2 % shampoo Apply 1 application topically 2 (two) times a week.       . methenamine (HIPREX) 1 G tablet Take 1 g by mouth daily as needed. Taken if patient has kidney infection. Alternating with Cipro.      . Multiple Vitamin (MULTIVITAMIN WITH MINERALS) TABS Take 1 tablet by mouth every morning.       . nitrofurantoin (MACRODANTIN) 50 MG capsule Take 50 mg by mouth at bedtime.      . selenium sulfide (SELSUN) 2.5 % shampoo Apply 1 application topically daily as needed (Applies to scalp for dandruff or itching.).       Marland Kitchen simvastatin (ZOCOR) 20 MG tablet Take 20 mg by mouth at bedtime.        . sodium chloride (OCEAN) 0.65 % nasal spray  Place 1 spray into the nose daily as needed. For nasal decongestant      . tiotropium (SPIRIVA) 18 MCG inhalation capsule Place 18 mcg into inhaler and inhale at bedtime.      . vitamin C (ASCORBIC ACID) 500 MG tablet Take 500 mg by mouth daily.       No current facility-administered medications on file prior to visit.    Review of Systems  Constitutional: Positive for fatigue.       Significant weight loss about 34 pounds since last seen. Hospitalization for small bowel obstruction contributed to the weight loss. Appetite has not been great and he has been trying to follow a low fiber diet.  HENT: Negative.   Eyes: Negative.    Respiratory: Negative.   Cardiovascular: Negative for chest pain, palpitations and leg swelling.  Gastrointestinal:       Recent bowel obstruction. History of pericolostomy ventral hernia.  Endocrine: Negative.   Genitourinary: Positive for urgency and frequency.  Musculoskeletal:       History of probable gout attack with uric acid 8.6. Chronic back pain.  Skin: Negative.   Allergic/Immunologic: Negative.   Neurological: Positive for weakness. Negative for tremors, syncope, speech difficulty and numbness.       History of dizziness.  Hematological: Negative.  Negative for adenopathy. Does not bruise/bleed easily.  Psychiatric/Behavioral: Negative.        Objective:BP 112/70  Pulse 57  Temp(Src) 97.7 F (36.5 C) (Oral)  Resp 16  Ht 6\' 2"  (1.88 m)  Wt 194 lb (87.998 kg)  BMI 24.9 kg/m2  SpO2 96%    Physical Exam  Constitutional: He is oriented to person, place, and time. He appears well-developed. No distress.  HENT:  Head: Normocephalic and atraumatic.  Right Ear: External ear normal.  Left Ear: External ear normal.  Nose: Nose normal.  Mouth/Throat: Oropharynx is clear and moist.  Partial loss of hearing.  Eyes: Conjunctivae and EOM are normal. Pupils are equal, round, and reactive to light.  Neck: Neck supple. No JVD present. No tracheal deviation present. No thyromegaly present.  Cardiovascular: Normal rate, regular rhythm, normal heart sounds and intact distal pulses.  Exam reveals no gallop and no friction rub.   No murmur heard. Pulmonary/Chest: Breath sounds normal. No respiratory distress. He has no wheezes. He has no rales. He exhibits no tenderness.  Abdominal: Bowel sounds are normal. He exhibits no distension and no mass.  Colostomy left lower quadrant. Paracolostomy herniation.  Musculoskeletal: Normal range of motion. He exhibits no edema and no tenderness.  Lymphadenopathy:    He has no cervical adenopathy.  Neurological: He is alert and oriented to  person, place, and time. No cranial nerve deficit. Coordination normal.  Skin: No rash noted. No erythema. No pallor.  Psychiatric: He has a normal mood and affect. His behavior is normal. Judgment and thought content normal.           Assessment & Plan:  Loss of weight: Date this is attributable to the recent hospitalization and prolonged period of time he was n.p.o. He feels insecure with his low-residue diet.   - Plan: Amb ref to Medical Nutrition Therapy-MNT, Comprehensive metabolic panel  Ureteral stricture, right with chronic nephroureteral stenting : Chronic issue which is unchanged he is to place a stent every 6 weeks  Gout: Possible acute attack during his hospitalization   - Plan: Uric Acid  Anemia, unspecified : Mild anemia during hospital stay   - Plan: CBC with Differential  Physical deconditioning: Patient does not feel like he needs physical therapy at home at this time. He is up and ambulatory and realizes that he needs to keep moving to regain his strength.  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

## 2013-08-04 ENCOUNTER — Other Ambulatory Visit: Payer: Self-pay | Admitting: Internal Medicine

## 2013-08-07 ENCOUNTER — Other Ambulatory Visit (HOSPITAL_COMMUNITY): Payer: Medicare Other

## 2013-08-08 ENCOUNTER — Other Ambulatory Visit (HOSPITAL_COMMUNITY): Payer: Self-pay | Admitting: Diagnostic Radiology

## 2013-08-08 ENCOUNTER — Ambulatory Visit (HOSPITAL_COMMUNITY)
Admission: RE | Admit: 2013-08-08 | Discharge: 2013-08-08 | Disposition: A | Discharge status: Home / Self Care | Payer: Medicare Other | Source: Ambulatory Visit | Attending: Interventional Radiology | Admitting: Interventional Radiology

## 2013-08-08 DIAGNOSIS — Z8546 Personal history of malignant neoplasm of prostate: Secondary | ICD-10-CM

## 2013-08-08 DIAGNOSIS — N1330 Unspecified hydronephrosis: Secondary | ICD-10-CM

## 2013-08-08 DIAGNOSIS — Z436 Encounter for attention to other artificial openings of urinary tract: Secondary | ICD-10-CM | POA: Insufficient documentation

## 2013-08-08 MED ORDER — IOHEXOL 300 MG/ML  SOLN
10.0000 mL | Freq: Once | INTRAMUSCULAR | Status: AC | PRN
  Administered 2013-08-08: 10 mL

## 2013-08-08 NOTE — Procedures (Signed)
Successful exchange of the right nephrostomy via the ostomy.  No immediate complication.

## 2013-08-09 ENCOUNTER — Other Ambulatory Visit: Payer: Medicare Other

## 2013-08-09 DIAGNOSIS — R634 Abnormal weight loss: Secondary | ICD-10-CM

## 2013-08-09 DIAGNOSIS — D649 Anemia, unspecified: Secondary | ICD-10-CM

## 2013-08-10 LAB — COMPREHENSIVE METABOLIC PANEL
ALT: 18 IU/L (ref 0–44)
AST: 15 IU/L (ref 0–40)
Albumin/Globulin Ratio: 1.6 (ref 1.1–2.5)
Calcium: 9.5 mg/dL (ref 8.6–10.2)
Chloride: 103 mmol/L (ref 97–108)
GFR calc non Af Amer: 36 mL/min/{1.73_m2} — ABNORMAL LOW (ref >59)
Potassium: 4.5 mmol/L (ref 3.5–5.2)
Sodium: 144 mmol/L (ref 134–144)
Total Bilirubin: 0.3 mg/dL (ref 0.0–1.2)

## 2013-08-10 LAB — CBC WITH DIFFERENTIAL/PLATELET
Basophils Absolute: 0 10*3/uL (ref 0.0–0.2)
Basos: 0 % (ref 0–3)
Eosinophils Absolute: 0.4 10*3/uL (ref 0.0–0.4)
HCT: 42.5 % (ref 37.5–51.0)
Hemoglobin: 14.7 g/dL (ref 12.6–17.7)
Immature Grans (Abs): 0 10*3/uL (ref 0.0–0.1)
Lymphs: 16 % (ref 14–46)
Monocytes Absolute: 0.7 10*3/uL (ref 0.1–0.9)
Neutrophils Relative %: 71 % (ref 40–74)
WBC: 8.3 10*3/uL (ref 3.4–10.8)

## 2013-08-13 ENCOUNTER — Ambulatory Visit: Payer: Self-pay | Admitting: Nurse Practitioner

## 2013-08-14 ENCOUNTER — Ambulatory Visit (INDEPENDENT_AMBULATORY_CARE_PROVIDER_SITE_OTHER): Payer: Medicare Other | Admitting: Internal Medicine

## 2013-08-14 ENCOUNTER — Encounter: Payer: Self-pay | Admitting: Internal Medicine

## 2013-08-14 ENCOUNTER — Ambulatory Visit: Payer: Self-pay | Admitting: Internal Medicine

## 2013-08-14 ENCOUNTER — Ambulatory Visit: Payer: Self-pay | Admitting: Nurse Practitioner

## 2013-08-14 VITALS — BP 118/72 | HR 66 | Temp 97.9°F | Resp 18 | Ht 74.0 in | Wt 193.8 lb

## 2013-08-14 DIAGNOSIS — IMO0002 Reserved for concepts with insufficient information to code with codable children: Secondary | ICD-10-CM

## 2013-08-14 DIAGNOSIS — K9409 Other complications of colostomy: Secondary | ICD-10-CM

## 2013-08-14 DIAGNOSIS — Z23 Encounter for immunization: Secondary | ICD-10-CM

## 2013-08-14 DIAGNOSIS — M109 Gout, unspecified: Secondary | ICD-10-CM

## 2013-08-14 DIAGNOSIS — D649 Anemia, unspecified: Secondary | ICD-10-CM

## 2013-08-14 DIAGNOSIS — J449 Chronic obstructive pulmonary disease, unspecified: Secondary | ICD-10-CM

## 2013-08-14 DIAGNOSIS — E785 Hyperlipidemia, unspecified: Secondary | ICD-10-CM

## 2013-08-14 DIAGNOSIS — R5381 Other malaise: Secondary | ICD-10-CM

## 2013-08-14 DIAGNOSIS — C61 Malignant neoplasm of prostate: Secondary | ICD-10-CM

## 2013-08-14 NOTE — Patient Instructions (Signed)
Continue current medication.

## 2013-08-14 NOTE — Progress Notes (Signed)
Subjective:    Patient ID: Michael Novak, male    DOB: [birth year], 79 y.o.   MRN: [id number]  Chief Complaint  Patient presents with  . Annual Exam    HPI COPD (chronic obstructive pulmonary disease): Chronic mild dyspnea  Prostate cancer: No evidence for relapse  Dyslipidemia: Controlled  Gout: No recent attacks  Anemia, unspecified: Resolved  Other and unspecified hyperlipidemia: Controlled  Parastomal hernia at left colostomy: Patient says that he has been told by the surgeons that is not a good candidate for surgical repair  Physical deconditioning: Continues struggling with easy fatigue. Some impairment in his ability to do ordinary activities of daily living.  Need for prophylactic vaccination and inoculation against influenza      Past Medical History  Diagnosis Date  . History of colostomy   . Urostomy stenosis     Ask patient to clarify, per history form dated 04/02/11.  . Prostate cancer   . Skin cancer   . Neuropathy     in feet   . COPD (chronic obstructive pulmonary disease)   . Depression   . UTI (lower urinary tract infection)     history   . AAA (abdominal aortic aneurysm)   . 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 Dyslipidemia 12/14/2011  . Bradycardia, HR 30-40s, Nov-Dec2012 11/26/2011  . 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 Loss of weight 08/22/2012  . Seborrhea capitis 02/29/2012  . Unspecified erythematous condition 02/29/2012  . Thrombocytopenia, unspecified 12/22/2011  . Acute kidney failure, unspecified 12/22/2011  . Pyelonephritis, unspecified 12/22/2011  . Inguinal hernia without mention of obstruction or gangrene, unilateral or unspecified, (not specified as recurrent) 12/01/2011  . Chest pain, unspecified 12/01/2011  . Acute upper respiratory infections of unspecified site 10/02/2009  . Sebaceous cyst 01/22/2009  . Carbuncle and furuncle of buttock 01/08/2009  . Ventral hernia,  unspecified, without mention of obstruction or gangrene 12/04/2008  . Incisional hernia without mention of obstruction or gangrene 03/20/2008  . Gout, unspecified 02/15/2008  . Other malaise and fatigue 10.24/2008  . Senile cataract, unspecified 02/18/2006  . Pain in joint, multiple sites 08/30/2005  . Diverticulosis of colon (without mention of hemorrhage) 02/11/2005  . Lumbago 09/25/2003  . Edema 09/25/2003  . 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 Unspecified hereditary and idiopathic peripheral neuropathy 08/10/2000  . Coronary atherosclerosis of unspecified type of vessel, native or graft 08/10/1996  . Acute myocardial infarction, unspecified site, episode of care unspecified 11/30/1995  . Lyme disease 11/30/1987   PROCEDURES 06/12/2011-Nuclear Medicine Ventilation-Perfusion Lung Scan: Low probability for acute pulmonary embolus. Obstructive pulmonary disease. 06/12/2011-Acute Abdomen Series: No acute abnormality. Scarring and emphysematous changes at the lung bases. Abdominal aortic aneurysm appears stable. No visible bowel obstruction. 06/14/2011-CT Abdomen/Pelvis: Post left lower abdomen/pelvic colostomy formation with parastomal hernia containing bowel loops but not causing obstruction. Right posterior inferior pararectal hernia containing bowel loops without obstruction. no extraluminal bowel inflammatory process noted. Post cystectomy with right ureterostomy. Progressive right sided prominent hydronephrosis with persistent moderate left sided hydronephrosis. Tiny gallstones versus radiopaque sludge. Prominent coronary artery calcifications. Slight increase in size of abdominal aneurysm  06/14/2011-CT Abdomen/Pelvis: Mild to moderate right hydronephrosis. 06/15/2011-Chest X-Ray: Linear atelectasis in both lung bases. 12-13-2011 - Renal Ultrasound - Bilateral hydronephrosis, minimally progressed on the left. No  ultrasound evidence of pyelonephritis. 06/21/12  CT Abdomen Pelvis w/o contrast -  Compared the prior study, right hydronephrosis has worsened and right perinephric stranding has worsened. No ureteral calculi are seen. 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 06/22/12 IR US Guide Bx Asp/drain - Successful placement of a right percutaneous nephrostomy tube using ultrasound and fluoroscopic guidance. 06/22/12 IR Perc Nephrostomy Right - Successful placement of a right percutaneous nephrostomy tube using ultrasound and fluoroscopic guidance. 06/23/12 DG Abd 1 View - Marked gaseous distension of predominately the small bowel, through distal colonic gas and stool is identified. While these finding may suggest ileus, early partial small bowel obstruction is not excluded. Continued attention on follow up is recommended. Note this examination is nondiagnostic for the evaluation of pneumoperitoneum. If this is of clinical concern, either upright or left lateral decubitus abdominal radiographs are recommended. 06/24/12 DG Abd 1 view - Persistent gaseous distended small bowel loops mid abdomen consistent with ileus or partial small  bowel obstruction. NG tube in place. 06/25/12 DG abd 1 view - Persistant gaseous distended small bowel loops consistant with ileus or partial small bowel obstruction. 06/26/12 IR Nephrostogram Right - Aentegrade nephrostogram demonstrates complete occlusion of the distal right ureter. Attempts at crossing the distal ureteral obstruction were unsuccessful. Successful exchange of percutaneous nephrostomy tube. This tube will be left to gravity drainage.  06/26/12 IR Nephrostomy Tube Change  06/26/12 DG abd Portable 2V - The degree of gaseous distension has significantly improved compared to yesterday's examination. While there continues to be some mild dilatation of small bowel, the overall appearance is one of a resolving ileus or partial small bowel obstruction. Support apparatus,  as above. 07/26/12 Nephrostogram right - Successful placement of a nephroureteral catheter via the urinary ostomy. Removal of the percutaneous nephrostomy tube. 07/26/12 IR Intro Uret Cath Perc Right - Successful placement of a nephroureteral catheter via the urinary ostomy. Removal of the percutaneous nephrostomy tube. Plan to exchange the catheter in 6 - 8 weeks. 01/21/2013 Renal/Urinary Tract Ultrasound complete Marked right-sided hydronephrosis and minimal left sided hydronephrosis  01/22/2013 PERC Nephrostomy (Right) under Ultrasound/Retrograde Nephro-Ureteral Catheter Placement under Fluoroscopy: Distal right ureteral stenosis could not be crossed retrograde. Technically successful right percutaneous nephrostomy access to allow guide wire passage across the right ureteral stenosis and ileal conduit. Technically successful retrograde right nephro-ureteral catheter replacement    CONSULTANTS Dr. Logan Bores Urology  Dr. Zachery Dakins Surgeon  Dr. Allyson Sabal Cardiologist  Dr. Anette Guarneri Ophthalmology  Dr. Roselind Messier Oncology Dr. Jerilynn Som Psychology  Dr. Arlyce Dice GI    Immunization History  Administered Date(s) Administered  . Influenza Whole 10/03/2002, 09/13/2007  . Influenza,inj,Quad PF,36+ Mos 08/14/2013    Current Outpatient Prescriptions on File Prior to Visit  Medication Sig Dispense Refill  . acetaminophen (TYLENOL) 500 MG tablet Take 1,000 mg by mouth every 6 (six) hours as needed. For pain      . albuterol (PROVENTIL HFA;VENTOLIN HFA) 108 (90 BASE) MCG/ACT inhaler Inhale 2 puffs into the lungs every 4 (four) hours as needed for shortness of breath.       Marland Kitchen aspirin EC 81 MG tablet Take 81 mg by mouth at bedtime.       Marland Kitchen buPROPion (WELLBUTRIN SR) 150 MG 12 hr tablet Take 150 mg by mouth 3 (three) times daily.       . Calcium-Magnesium-Vitamin D (CALCIUM MAGNESIUM PO) Take 1 tablet by mouth every morning.       . gabapentin (NEURONTIN)  300 MG capsule Take 300 mg by mouth 3 (three) times daily. Take 1  tablet twice daily  And 2 tablets at bedtime to treat neuropathy.      Marland Kitchen glucosamine-chondroitin 500-400 MG tablet Take 1 tablet by mouth 2 (two) times daily.       . hydrocortisone 2.5 % cream APPLY TO AFFECTED AREA ONE TO TWO TIMES PER DAY  20 g  6  . ketoconazole (NIZORAL) 2 % shampoo Apply 1 application topically 2 (two) times a week.       . methenamine (HIPREX) 1 G tablet Take 1 g by mouth daily as needed. Taken if patient has kidney infection. Alternating with Cipro.      . Multiple Vitamin (MULTIVITAMIN WITH MINERALS) TABS Take 1 tablet by mouth every morning.       . nitrofurantoin (MACRODANTIN) 50 MG capsule Take 50 mg by mouth at bedtime.      . selenium sulfide (SELSUN) 2.5 % shampoo Apply 1 application topically daily as needed (Applies to scalp for dandruff or itching.).       Marland Kitchen simvastatin (ZOCOR) 20 MG tablet Take 20 mg by mouth at bedtime.        . sodium chloride (OCEAN) 0.65 % nasal spray Place 1 spray into the nose daily as needed. For nasal decongestant      . sorbitol 70 % solution Take 15-30 mLs by mouth daily as needed. For constipation  473 mL  5  . vitamin C (ASCORBIC ACID) 500 MG tablet Take 500 mg by mouth daily.      Marland Kitchen tiotropium (SPIRIVA) 18 MCG inhalation capsule Place 18 mcg into inhaler and inhale at bedtime.       No current facility-administered medications on file prior to visit.   Family Status  Relation Status Death Age  . Mother Deceased 29    natural causes  . Father Deceased 69    natural causes  . Sister Deceased   . Brother Deceased     cancer  . Daughter Alive   . Son Alive   . Sister Deceased   . Sister Deceased     kidney failure  . Sister Deceased   . Son Alive   . Son Alive    Family History  Problem Relation Age of Onset  . Heart disease Sister   . Heart disease Brother   . Cancer Brother   . Heart disease Sister   . Kidney disease Sister   . Cancer Sister    History   Social History  . Marital Status: Married    Spouse  Name: N/A    Number of Children: N/A  . Years of Education: N/A   Social History Main Topics  . Smoking status: Former Smoker -- 37 years    Types: Cigarettes  . Smokeless tobacco: Never Used  . Alcohol Use: 0.6 oz/week    1 Glasses of wine per week     Comment: wine occasionally with supper   . Drug Use: No  . Sexual Activity: No   Other Topics Concern  . None   Social History Narrative  . None     Review of Systems  Constitutional: Positive for fatigue. Negative for fever, chills and unexpected weight change.  HENT: Negative.   Eyes: Negative.   Respiratory: Negative.   Cardiovascular: Negative for chest pain, palpitations and leg swelling.  Gastrointestinal: Negative for abdominal pain and abdominal distention.       Colostomy. Pericolostomy ventral hernia.  Endocrine: Negative.   Genitourinary: Positive for urgency and frequency.  Musculoskeletal: Positive for arthralgias and back pain.  Skin: Negative.   Allergic/Immunologic: Negative.   Neurological: Positive for dizziness.  Hematological:       History of anemia.  Psychiatric/Behavioral: Negative.        Objective:BP 118/72  Pulse 66  Temp(Src) 97.9 F (36.6 C) (Oral)  Resp 18  Ht 6\' 2"  (1.88 m)  Wt 193 lb 12.8 oz (87.907 kg)  BMI 24.87 kg/m2  SpO2 95%    Physical Exam  Constitutional: He is oriented to person, place, and time. He appears well-nourished. No distress.  HENT:  Head: Normocephalic.  Right Ear: External ear normal.  Left Ear: External ear normal.  Nose: Nose normal.  Eyes: Conjunctivae and EOM are normal. Pupils are equal, round, and reactive to light.  Neck: Normal range of motion. Neck supple. No JVD present. No tracheal deviation present. No thyromegaly present.  Cardiovascular: Normal rate, regular rhythm, normal heart sounds and intact distal pulses.  Exam reveals no gallop and no friction rub.   No murmur heard. Pulmonary/Chest: Breath sounds normal. No respiratory distress. He  has no wheezes. He has no rales. He exhibits no tenderness.  Abdominal: Soft. Bowel sounds are normal. He exhibits no distension and no mass. There is no tenderness.  Colostomy left lower quadrant.. Pericolostomy ventral hernia.  Musculoskeletal: Normal range of motion. He exhibits no edema and no tenderness.  Lymphadenopathy:    He has no cervical adenopathy.  Neurological: He is alert and oriented to person, place, and time. No cranial nerve deficit. Coordination normal.  Skin: No rash noted. No erythema. No pallor.  Psychiatric: He has a normal mood and affect. His behavior is normal. Judgment and thought content normal.      Appointment on 08/09/2013  Component Date Value Range Status  . WBC 08/09/2013 8.3  3.4 - 10.8 x10E3/uL Final  . RBC 08/09/2013 5.49  4.14 - 5.80 x10E6/uL Final  . Hemoglobin 08/09/2013 14.7  12.6 - 17.7 g/dL Final  . HCT 16/08/9603 42.5  37.5 - 51.0 % Final  . MCV 08/09/2013 77* 79 - 97 fL Final  . MCH 08/09/2013 26.8  26.6 - 33.0 pg Final  . MCHC 08/09/2013 34.6  31.5 - 35.7 g/dL Final  . RDW 54/07/8118 19.1* 12.3 - 15.4 % Final  . Neutrophils Relative % 08/09/2013 71  40 - 74 % Final   Comment: The percent (%) differential reference intervals for normal cell types                          will be removed from patient reports beginning August 13, 2013,                          consistent with CAP standards.  . Lymphs 08/09/2013 16  14 - 46 % Final   Comment: The percent (%) differential reference intervals for normal cell types                          will be removed from patient reports beginning August 13, 2013,                          consistent with CAP standards.  . Monocytes 08/09/2013 8  4 - 12 % Final   Comment: The percent (%) differential reference  intervals for normal cell types                          will be removed from patient reports beginning August 13, 2013,                          consistent with CAP standards.  . Eos  08/09/2013 5  0 - 5 % Final   Comment: The percent (%) differential reference intervals for normal cell types                          will be removed from patient reports beginning August 13, 2013,                          consistent with CAP standards.  . Basos 08/09/2013 0  0 - 3 % Final   Comment: The percent (%) differential reference intervals for normal cell types                          will be removed from patient reports beginning August 13, 2013,                          consistent with CAP standards.  . Neutrophils Absolute 08/09/2013 5.9  1.4 - 7.0 x10E3/uL Final  . Lymphocytes Absolute 08/09/2013 1.4  0.7 - 3.1 x10E3/uL Final  . Monocytes Absolute 08/09/2013 0.7  0.1 - 0.9 x10E3/uL Final  . Eosinophils Absolute 08/09/2013 0.4  0.0 - 0.4 x10E3/uL Final  . Basophils Absolute 08/09/2013 0.0  0.0 - 0.2 x10E3/uL Final  . Immature Granulocytes 08/09/2013 0  0 - 2 % Final   Comment: The percent (%) differential reference intervals for normal cell types                          will be removed from patient reports beginning August 13, 2013,                          consistent with CAP standards.  . Immature Grans (Abs) 08/09/2013 0.0  0.0 - 0.1 x10E3/uL Final  . Glucose 08/09/2013 87  65 - 99 mg/dL Final  . BUN 16/08/9603 18  8 - 27 mg/dL Final  . Creatinine, Ser 08/09/2013 1.72* 0.76 - 1.27 mg/dL Final  . GFR calc non Af Amer 08/09/2013 36* >59 mL/min/1.73 Final  . GFR calc Af Amer 08/09/2013 42* >59 mL/min/1.73 Final  . BUN/Creatinine Ratio 08/09/2013 10  10 - 22 Final  . Sodium 08/09/2013 144  134 - 144 mmol/L Final  . Potassium 08/09/2013 4.5  3.5 - 5.2 mmol/L Final  . Chloride 08/09/2013 103  97 - 108 mmol/L Final  . CO2 08/09/2013 24  18 - 29 mmol/L Final  . Calcium 08/09/2013 9.5  8.6 - 10.2 mg/dL Final  . Total Protein 08/09/2013 6.6  6.0 - 8.5 g/dL Final  . Albumin 54/07/8118 4.1  3.5 - 4.7 g/dL Final  . Globulin, Total 08/09/2013 2.5  1.5 - 4.5 g/dL Final  .  Albumin/Globulin Ratio 08/09/2013 1.6  1.1 - 2.5 Final  . Total Bilirubin 08/09/2013 0.3  0.0 - 1.2 mg/dL Final  . Alkaline Phosphatase 08/09/2013 105  39 - 117 IU/L  Final  . AST 08/09/2013 15  0 - 40 IU/L Final  . ALT 08/09/2013 18  0 - 44 IU/L Final       Assessment & Plan:  1. COPD (chronic obstructive pulmonary disease) No new medications. Observe.  2. Prostate cancer No evidence of relapse  3. Dyslipidemia Followup at future visit  4. Gout No recent attacks  5. Anemia, unspecified Resolved  6. Other and unspecified hyperlipidemia Followup the future visit  7. Parastomal hernia at left colostomy Poor surgical candidate  8. Physical deconditioning Continue to be active as much as possible  9. Need for prophylactic vaccination and inoculation against influenza Administered

## 2013-08-16 ENCOUNTER — Ambulatory Visit: Payer: Self-pay | Admitting: Internal Medicine

## 2013-08-16 ENCOUNTER — Ambulatory Visit: Payer: Self-pay | Admitting: Nurse Practitioner

## 2013-08-27 ENCOUNTER — Other Ambulatory Visit (HOSPITAL_COMMUNITY): Payer: Self-pay | Admitting: Interventional Radiology

## 2013-09-19 ENCOUNTER — Other Ambulatory Visit (HOSPITAL_COMMUNITY): Payer: Self-pay | Admitting: Diagnostic Radiology

## 2013-09-19 ENCOUNTER — Ambulatory Visit (HOSPITAL_COMMUNITY)
Admission: RE | Admit: 2013-09-19 | Discharge: 2013-09-19 | Disposition: A | Discharge status: Home / Self Care | Payer: Medicare Other | Source: Ambulatory Visit | Attending: Diagnostic Radiology | Admitting: Diagnostic Radiology

## 2013-09-19 DIAGNOSIS — N1330 Unspecified hydronephrosis: Secondary | ICD-10-CM

## 2013-09-19 DIAGNOSIS — Z436 Encounter for attention to other artificial openings of urinary tract: Secondary | ICD-10-CM | POA: Insufficient documentation

## 2013-09-19 DIAGNOSIS — Z8546 Personal history of malignant neoplasm of prostate: Secondary | ICD-10-CM

## 2013-09-19 DIAGNOSIS — C61 Malignant neoplasm of prostate: Secondary | ICD-10-CM | POA: Insufficient documentation

## 2013-09-19 MED ORDER — IOHEXOL 300 MG/ML  SOLN
20.0000 mL | Freq: Once | INTRAMUSCULAR | Status: AC | PRN
  Administered 2013-09-19: 10 mL

## 2013-09-19 NOTE — Procedures (Signed)
Successful exchange of right nephroureteral catheter via the ostomy.  Removed 210 ml of urine from right renal collecting system.

## 2013-10-02 ENCOUNTER — Other Ambulatory Visit: Payer: Self-pay | Admitting: Internal Medicine

## 2013-10-10 ENCOUNTER — Ambulatory Visit (HOSPITAL_COMMUNITY)
Admission: RE | Admit: 2013-10-10 | Discharge: 2013-10-10 | Disposition: A | Discharge status: Home / Self Care | Payer: Medicare Other | Source: Ambulatory Visit | Attending: Diagnostic Radiology | Admitting: Diagnostic Radiology

## 2013-10-10 ENCOUNTER — Other Ambulatory Visit (HOSPITAL_COMMUNITY): Payer: Self-pay | Admitting: Interventional Radiology

## 2013-10-10 DIAGNOSIS — Z466 Encounter for fitting and adjustment of urinary device: Secondary | ICD-10-CM | POA: Insufficient documentation

## 2013-10-10 DIAGNOSIS — N1330 Unspecified hydronephrosis: Secondary | ICD-10-CM

## 2013-10-10 DIAGNOSIS — Z8546 Personal history of malignant neoplasm of prostate: Secondary | ICD-10-CM

## 2013-10-10 DIAGNOSIS — C61 Malignant neoplasm of prostate: Secondary | ICD-10-CM | POA: Insufficient documentation

## 2013-10-10 MED ORDER — IOHEXOL 300 MG/ML  SOLN
10.0000 mL | Freq: Once | INTRAMUSCULAR | Status: AC | PRN
  Administered 2013-10-10: 10 mL

## 2013-10-10 NOTE — Procedures (Signed)
Successful rt nephroureteral cath exchange No comp Stable Full report in pacs

## 2013-10-30 ENCOUNTER — Other Ambulatory Visit (HOSPITAL_COMMUNITY): Payer: Self-pay | Admitting: Interventional Radiology

## 2013-10-30 ENCOUNTER — Ambulatory Visit (HOSPITAL_COMMUNITY)
Admission: RE | Admit: 2013-10-30 | Discharge: 2013-10-30 | Disposition: A | Discharge status: Home / Self Care | Payer: Medicare Other | Source: Ambulatory Visit | Attending: Interventional Radiology | Admitting: Interventional Radiology

## 2013-10-30 DIAGNOSIS — Z8546 Personal history of malignant neoplasm of prostate: Secondary | ICD-10-CM

## 2013-10-30 DIAGNOSIS — N135 Crossing vessel and stricture of ureter without hydronephrosis: Secondary | ICD-10-CM | POA: Insufficient documentation

## 2013-10-30 DIAGNOSIS — N1330 Unspecified hydronephrosis: Secondary | ICD-10-CM

## 2013-10-30 DIAGNOSIS — Z436 Encounter for attention to other artificial openings of urinary tract: Secondary | ICD-10-CM | POA: Insufficient documentation

## 2013-10-30 MED ORDER — IOHEXOL 300 MG/ML  SOLN
10.0000 mL | Freq: Once | INTRAMUSCULAR | Status: AC | PRN
  Administered 2013-10-30: 10 mL

## 2013-10-31 ENCOUNTER — Other Ambulatory Visit (HOSPITAL_COMMUNITY): Payer: Medicare Other

## 2013-11-16 ENCOUNTER — Other Ambulatory Visit (HOSPITAL_COMMUNITY): Payer: Medicare Other

## 2013-11-27 ENCOUNTER — Other Ambulatory Visit (HOSPITAL_COMMUNITY): Payer: Medicare Other

## 2013-11-28 ENCOUNTER — Other Ambulatory Visit (HOSPITAL_COMMUNITY): Payer: Self-pay | Admitting: Interventional Radiology

## 2013-11-28 ENCOUNTER — Ambulatory Visit (HOSPITAL_COMMUNITY)
Admission: RE | Admit: 2013-11-28 | Discharge: 2013-11-28 | Disposition: A | Discharge status: Home / Self Care | Payer: Medicare Other | Source: Ambulatory Visit | Attending: Interventional Radiology | Admitting: Interventional Radiology

## 2013-11-28 ENCOUNTER — Other Ambulatory Visit (HOSPITAL_COMMUNITY): Payer: Self-pay | Admitting: Diagnostic Radiology

## 2013-11-28 DIAGNOSIS — C61 Malignant neoplasm of prostate: Secondary | ICD-10-CM | POA: Insufficient documentation

## 2013-11-28 DIAGNOSIS — Z8546 Personal history of malignant neoplasm of prostate: Secondary | ICD-10-CM

## 2013-11-28 DIAGNOSIS — N1330 Unspecified hydronephrosis: Secondary | ICD-10-CM

## 2013-11-28 DIAGNOSIS — Z436 Encounter for attention to other artificial openings of urinary tract: Secondary | ICD-10-CM | POA: Insufficient documentation

## 2013-11-28 MED ORDER — IOHEXOL 300 MG/ML  SOLN
10.0000 mL | Freq: Once | INTRAMUSCULAR | Status: AC | PRN
  Administered 2013-11-28: 10 mL

## 2013-11-28 NOTE — Procedures (Signed)
Exchange of right nephroureteral catheter via the ostomy.  No immediate complication.

## 2013-12-11 ENCOUNTER — Ambulatory Visit: Payer: Medicare Other | Admitting: Internal Medicine

## 2013-12-16 ENCOUNTER — Other Ambulatory Visit: Payer: Self-pay | Admitting: Diagnostic Radiology

## 2013-12-16 DIAGNOSIS — C61 Malignant neoplasm of prostate: Secondary | ICD-10-CM

## 2013-12-17 ENCOUNTER — Encounter (HOSPITAL_COMMUNITY): Payer: Self-pay

## 2013-12-17 ENCOUNTER — Ambulatory Visit (HOSPITAL_COMMUNITY)
Admission: RE | Admit: 2013-12-17 | Discharge: 2013-12-17 | Disposition: A | Discharge status: Home / Self Care | Payer: Medicare Other | Source: Ambulatory Visit | Attending: Diagnostic Radiology | Admitting: Diagnostic Radiology

## 2013-12-17 ENCOUNTER — Other Ambulatory Visit: Payer: Self-pay | Admitting: Diagnostic Radiology

## 2013-12-17 DIAGNOSIS — I714 Abdominal aortic aneurysm, without rupture, unspecified: Secondary | ICD-10-CM | POA: Insufficient documentation

## 2013-12-17 DIAGNOSIS — J449 Chronic obstructive pulmonary disease, unspecified: Secondary | ICD-10-CM | POA: Insufficient documentation

## 2013-12-17 DIAGNOSIS — Z8546 Personal history of malignant neoplasm of prostate: Secondary | ICD-10-CM | POA: Insufficient documentation

## 2013-12-17 DIAGNOSIS — I2510 Atherosclerotic heart disease of native coronary artery without angina pectoris: Secondary | ICD-10-CM | POA: Insufficient documentation

## 2013-12-17 DIAGNOSIS — J4489 Other specified chronic obstructive pulmonary disease: Secondary | ICD-10-CM | POA: Insufficient documentation

## 2013-12-17 DIAGNOSIS — N1330 Unspecified hydronephrosis: Secondary | ICD-10-CM | POA: Insufficient documentation

## 2013-12-17 DIAGNOSIS — C61 Malignant neoplasm of prostate: Secondary | ICD-10-CM

## 2013-12-17 DIAGNOSIS — I252 Old myocardial infarction: Secondary | ICD-10-CM | POA: Insufficient documentation

## 2013-12-17 DIAGNOSIS — E785 Hyperlipidemia, unspecified: Secondary | ICD-10-CM | POA: Insufficient documentation

## 2013-12-17 LAB — CBC
HEMATOCRIT: 40.3 % (ref 39.0–52.0)
Hemoglobin: 14.5 g/dL (ref 13.0–17.0)
MCH: 33.3 pg (ref 26.0–34.0)
MCHC: 36 g/dL (ref 30.0–36.0)
MCV: 92.4 fL (ref 78.0–100.0)
Platelets: 186 10*3/uL (ref 150–400)
RBC: 4.36 MIL/uL (ref 4.22–5.81)
RDW: 13 % (ref 11.5–15.5)
WBC: 4.9 10*3/uL (ref 4.0–10.5)

## 2013-12-17 LAB — APTT: APTT: 35 s (ref 24–37)

## 2013-12-17 LAB — BASIC METABOLIC PANEL
BUN: 25 mg/dL — ABNORMAL HIGH (ref 6–23)
CALCIUM: 9.4 mg/dL (ref 8.4–10.5)
CHLORIDE: 101 meq/L (ref 96–112)
CO2: 22 meq/L (ref 19–32)
CREATININE: 1.44 mg/dL — AB (ref 0.50–1.35)
GFR calc non Af Amer: 44 mL/min — ABNORMAL LOW (ref >90)
GFR, EST AFRICAN AMERICAN: 51 mL/min — AB (ref >90)
Glucose, Bld: 89 mg/dL (ref 70–99)
Potassium: 4.7 mEq/L (ref 3.7–5.3)
Sodium: 138 mEq/L (ref 137–147)

## 2013-12-17 LAB — PROTIME-INR
INR: 1.15 (ref 0.00–1.49)
Prothrombin Time: 14.5 seconds (ref 11.6–15.2)

## 2013-12-17 MED ORDER — SODIUM CHLORIDE 0.9 % IV SOLN: INTRAVENOUS | Status: DC

## 2013-12-17 MED ORDER — MIDAZOLAM HCL 2 MG/2ML IJ SOLN
INTRAMUSCULAR | Status: AC | PRN
Start: 2013-12-17 — End: 2013-12-17
  Administered 2013-12-17 (×2): 1 mg via INTRAVENOUS

## 2013-12-17 MED ORDER — LIDOCAINE HCL 1 % IJ SOLN
INTRAMUSCULAR | Status: AC
  Filled 2013-12-17: qty 20

## 2013-12-17 MED ORDER — MIDAZOLAM HCL 2 MG/2ML IJ SOLN
INTRAMUSCULAR | Status: AC
  Filled 2013-12-17: qty 6

## 2013-12-17 MED ORDER — FENTANYL CITRATE 0.05 MG/ML IJ SOLN
INTRAMUSCULAR | Status: AC | PRN
  Administered 2013-12-17: 50 ug via INTRAVENOUS

## 2013-12-17 MED ORDER — CIPROFLOXACIN IN D5W 400 MG/200ML IV SOLN
INTRAVENOUS | Status: AC
  Filled 2013-12-17: qty 200

## 2013-12-17 MED ORDER — CIPROFLOXACIN IN D5W 400 MG/200ML IV SOLN
400.0000 mg | INTRAVENOUS | Status: AC
  Administered 2013-12-17: 400 mg via INTRAVENOUS

## 2013-12-17 MED ORDER — IOHEXOL 300 MG/ML  SOLN
10.0000 mL | Freq: Once | INTRAMUSCULAR | Status: AC | PRN
  Administered 2013-12-17: 10 mL

## 2013-12-17 MED ORDER — FENTANYL CITRATE 0.05 MG/ML IJ SOLN
INTRAMUSCULAR | Status: AC
  Filled 2013-12-17: qty 6

## 2013-12-17 NOTE — H&P (Signed)
Michael Novak is an 80 y.o. male.   Chief Complaint: dislodged nephroureteral catheter HPI: Patient with history of prostate carcinoma/prior cystectomy and chronic indwelling right nephroureteral catheter via right lower quadrant ostomy presents today for replacement of catheter after accidental dislodgement during showering 1/18.   Past Medical History  Diagnosis Date  . History of colostomy   . Urostomy stenosis     Ask patient to clarify, per history form dated 04/02/11.  . Prostate cancer   . Skin cancer   . Neuropathy     in feet   . COPD (chronic obstructive pulmonary disease)   . Depression   . UTI (lower urinary tract infection)     history   . AAA (abdominal aortic aneurysm)   . 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 Dyslipidemia 12/14/2011  . Bradycardia, HR 30-40s, Nov-Dec2012 11/26/2011  . 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 Loss of weight 08/22/2012  . Seborrhea capitis 02/29/2012  . Unspecified erythematous condition 02/29/2012  . Thrombocytopenia, unspecified 12/22/2011  . Acute kidney failure, unspecified 12/22/2011  . Pyelonephritis, unspecified 12/22/2011  . Inguinal hernia without mention of obstruction or gangrene, unilateral or unspecified, (not specified as recurrent) 12/01/2011  . Chest pain, unspecified 12/01/2011  . Acute upper respiratory infections of unspecified site 10/02/2009  . Sebaceous cyst 01/22/2009  . Carbuncle and furuncle of buttock 01/08/2009  . Ventral hernia, unspecified, without mention of obstruction or gangrene 12/04/2008  . Incisional hernia without mention of obstruction or gangrene 03/20/2008  . Gout, unspecified 02/15/2008  . Other malaise and fatigue 10.24/2008  . Senile cataract, unspecified 02/18/2006  . Pain in joint, multiple sites 08/30/2005  . Diverticulosis of colon (without mention of hemorrhage) 02/11/2005  . Lumbago 09/25/2003  . Edema 09/25/2003  . Intestinovesical fistula  11/29/2000  . Unspecified hereditary and idiopathic peripheral neuropathy 08/10/2000  . Coronary atherosclerosis of unspecified type of vessel, native or graft 08/10/1996  . Acute myocardial infarction, unspecified site, episode of care unspecified 11/30/1995  . Lyme disease 11/30/1987    Past Surgical History  Procedure Laterality Date  . Cystectomy, ileal coduit, colostomy  2002    for severe rectourethral fistula  . Ex lap, lysis of adhesions  2010    for SBO  . Lap repair of parastomal hernias  2009    Dr. Barnetta Chapel [facility],  Hematoma evac POD#1  . Eye surgery      cataracts removed  . Cardiac catheterization  07/04/2001  . Colostomy  08/03/2001    Zachery Dakins, MD  . Anabel Bene  Benay Spice, MD  . Hernia repair  1970    x 4  . Clavicle surgery  1952    Separation    Family History  Problem Relation Age of Onset  . Heart disease Sister   . Heart disease Brother   . Cancer Brother   . Heart disease Sister   . Kidney disease Sister   . Cancer Sister    Social History:  reports that he has quit smoking. His smoking use included Cigarettes. He smoked 0.00 packs per day for 37 years. He has never used smokeless tobacco. He reports that he drinks about 0.6 ounces of alcohol per week. He reports that he does not use illicit drugs.  Allergies:  Allergies  Allergen Reactions  . Augmentin [Amoxicillin-Pot Clavulanate] Other (See Comments)    Reaction unknown.    Current outpatient prescriptions:acetaminophen (TYLENOL) 500 MG tablet, Take  1,000 mg by mouth every 6 (six) hours as needed. For pain, Disp: , Rfl: ;  albuterol (PROVENTIL HFA;VENTOLIN HFA) 108 (90 BASE) MCG/ACT inhaler, Inhale 2 puffs into the lungs every 4 (four) hours as needed for shortness of breath. , Disp: , Rfl: ;  aspirin EC 81 MG tablet, Take 81 mg by mouth at bedtime. , Disp: , Rfl:  buPROPion (WELLBUTRIN SR) 150 MG 12 hr tablet, Take 150 mg by mouth 3 (three) times daily. , Disp: , Rfl: ;   Calcium-Magnesium-Vitamin D (CALCIUM MAGNESIUM PO), Take 1 tablet by mouth every morning. , Disp: , Rfl: ;  gabapentin (NEURONTIN) 300 MG capsule, Take 300 mg by mouth 3 (three) times daily. Take 1 tablet twice daily  And 2 tablets at bedtime to treat neuropathy., Disp: , Rfl:  glucosamine-chondroitin 500-400 MG tablet, Take 1 tablet by mouth 2 (two) times daily. , Disp: , Rfl: ;  hydrocortisone 2.5 % cream, APPLY TO AFFECTED AREA ONE TO TWO TIMES PER DAY, Disp: 20 g, Rfl: 6;  ketoconazole (NIZORAL) 2 % shampoo, Apply 1 application topically 2 (two) times a week. , Disp: , Rfl:  methenamine (HIPREX) 1 G tablet, Take 1 g by mouth daily as needed. Taken if patient has kidney infection. Alternating with Cipro., Disp: , Rfl: ;  Multiple Vitamin (MULTIVITAMIN WITH MINERALS) TABS, Take 1 tablet by mouth every morning. , Disp: , Rfl: ;  nitrofurantoin (MACRODANTIN) 50 MG capsule, Take 50 mg by mouth at bedtime., Disp: , Rfl:  selenium sulfide (SELSUN) 2.5 % shampoo, Apply 1 application topically daily as needed (Applies to scalp for dandruff or itching.). , Disp: , Rfl: ;  simvastatin (ZOCOR) 20 MG tablet, Take 20 mg by mouth at bedtime.  , Disp: , Rfl: ;  sodium chloride (OCEAN) 0.65 % nasal spray, Place 1 spray into the nose daily as needed. For nasal decongestant, Disp: , Rfl:  triamcinolone cream (KENALOG) 0.1 %, APPLY NIGHTLY TO IRRITATED SCALP 3 NIGHTS PER WEEK. SHAMPOO IN THE MORNING., Disp: 85.2 g, Rfl: 0;  vitamin C (ASCORBIC ACID) 500 MG tablet, Take 500 mg by mouth daily., Disp: , Rfl: ;  tiotropium (SPIRIVA) 18 MCG inhalation capsule, Place 18 mcg into inhaler and inhale at bedtime., Disp: , Rfl:  Current facility-administered medications:0.9 %  sodium chloride infusion, , Intravenous, Continuous, D Kevin Willowdean Luhmann, PA-C;  ciprofloxacin (CIPRO) IVPB 400 mg, 400 mg, Intravenous, On Call, D Rowe Robert, PA-C   Results for orders placed during the hospital encounter of 12/17/13 (from the past 48 hour(s))   CBC     Status: None   Collection Time    12/17/13 10:55 AM      Result Value Range   WBC 4.9  4.0 - 10.5 K/uL   RBC 4.36  4.22 - 5.81 MIL/uL   Hemoglobin 14.5  13.0 - 17.0 g/dL   HCT 40.3  39.0 - 52.0 %   MCV 92.4  78.0 - 100.0 fL   MCH 33.3  26.0 - 34.0 pg   MCHC 36.0  30.0 - 36.0 g/dL   RDW 13.0  11.5 - 15.5 %   Platelets 186  150 - 400 K/uL  PROTIME-INR     Status: None   Collection Time    12/17/13 10:55 AM      Result Value Range   Prothrombin Time 14.5  11.6 - 15.2 seconds   INR 1.15  0.00 - 1.49  APTT     Status: None   Collection Time  12/17/13 10:55 AM      Result Value Range   aPTT 35  24 - 37 seconds  BASIC METABOLIC PANEL     Status: Abnormal   Collection Time    12/17/13 10:55 AM      Result Value Range   Sodium 138  137 - 147 mEq/L   Potassium 4.7  3.7 - 5.3 mEq/L   Chloride 101  96 - 112 mEq/L   CO2 22  19 - 32 mEq/L   Glucose, Bld 89  70 - 99 mg/dL   BUN 25 (*) 6 - 23 mg/dL   Creatinine, Ser 1.44 (*) 0.50 - 1.35 mg/dL   Calcium 9.4  8.4 - 10.5 mg/dL   GFR calc non Af Amer 44 (*) >90 mL/min   GFR calc Af Amer 51 (*) >90 mL/min   Comment: (NOTE)     The eGFR has been calculated using the CKD EPI equation.     This calculation has not been validated in all clinical situations.     eGFR's persistently <90 mL/min signify possible Chronic Kidney     Disease.   No results found.  Review of Systems  Constitutional: Negative for fever.       Feels "cold"  Respiratory: Negative for cough and shortness of breath.   Cardiovascular: Negative for chest pain.  Gastrointestinal: Negative for nausea, vomiting and abdominal pain.  Genitourinary: Positive for flank pain.       Recent hematuria after nephroureteral cath dislodgement  Neurological: Positive for headaches.  Endo/Heme/Allergies: Does not bruise/bleed easily.    Blood pressure 142/90, pulse 68, temperature 97 F (36.1 C), temperature source Oral, resp. rate 18, height 6' (1.829 m), weight 190  lb (86.183 kg), SpO2 96.00%. Physical Exam  Constitutional: He is oriented to person, place, and time. He appears well-developed and well-nourished.  Cardiovascular: Normal rate and regular rhythm.   Respiratory: Effort normal.  GI: Soft. Bowel sounds are normal.  Mild rt flank tenderness, RLQ urostomy with cloudy urine in bag, LLQ colostomy intact  Musculoskeletal: Normal range of motion. He exhibits no edema.  Neurological: He is alert and oriented to person, place, and time.     Assessment/Plan Patient with history of prostate carcinoma/prior cystectomy and chronic indwelling right nephroureteral catheter via right lower quadrant ostomy presents today for replacement of catheter after accidental dislodgement during showering 1/18. Details/risks of procedure d/w pt/wife with their understanding and consent.   Dmani Mizer,D KEVIN 12/17/2013, 11:40 AM

## 2013-12-17 NOTE — Progress Notes (Signed)
Asked Dr.Shick if pt should flush nephrostomy tube.  Dr. Annamaria Boots states no, disregard the flush order.

## 2013-12-17 NOTE — Discharge Instructions (Signed)
Percutaneous Nephrostomy, Care After Refer to this sheet in the next few weeks. These instructions provide you with information on caring for yourself after your procedure. Your health care provider may also give you more specific instructions. Your treatment has been planned according to current medical practices, but problems sometimes occur. Call your health care provider if you have any problems or questions after your procedure. WHAT TO EXPECT AFTER THE PROCEDURE  You will need to remain lying down for several hours. Some people are able to leave the hospital the same day, others stay overnight. HOME CARE INSTRUCTIONS  Your nephrostomy tube is connected to a leg bag or bedside drainage bag. Always keep the tubing, the leg bag, or the bedside drainage bags below the level of the kidney so that the urine drains freely.  During the day, if you are connecting the nephrostomy tube to a leg bag, be sure there are no kinks in the tubing and that the urine is draining freely.  At night, you may want to connect the nephrostomy tube or the leg bag to a larger bedside drainage bag.  Change the dressing as often as directed by your health care provider or if it becomes wet.  Gently remove the tapes and dressing from around the nephrostomy tube. Be careful not to pull on the tube while removing the dressing.  Wash the skin around the tube, rinse well and dry.  Place two split drain sponges in and around the tube exit site.  Place tape around edge of the dressing.  Secure the nephrostomy tubing. Remember to make certain that the nephrostomy tube does not kink or become pinched closed. It can be useful to wrap any exposed tubing going from the nephrostomy tube to any of the connecting tubes to either the leg bag or drainage bag with an elastic bandage.  Every three weeks, replace the leg bag, drainage bag, and any extension tubing connected to your nephrostomy tube. Your health care provider will  explain how to change the drainage bag and extension tubing. SEEK MEDICAL CARE IF:  You experience any problems with any of the valves or tubing.  You have persistent pain or soreness in your back. SEEK IMMEDIATE MEDICAL CARE IF:  You have a fever or chills.  You have abdominal pain during the first week.  You have a new appearance of blood in urine.  You have back pain that is not relieved by your medicine.  You have redness, swelling, or drainage at the tube insertion site.  You have decreased urine output.  Your nephrostomy tube comes out. Document Released: 07/08/2004 Document Revised: 09/05/2013 Document Reviewed: 07/12/2013 [facility] Patient Information 2014 [place], [place]. Moderate Sedation, Adult Moderate sedation is given to help you relax or even sleep through a procedure. You may remain sleepy, be clumsy, or have poor balance for several hours following this procedure. Arrange for a responsible adult, family member, or friend to take you home. A responsible adult should stay with you for at least 24 hours or until the medicines have worn off.  Do not participate in any activities where you could become injured for the next 24 hours, or until you feel normal again. Do not:  Drive.  Swim.  Ride a bicycle.  Operate heavy machinery.  Cook.  Use power tools.  Climb ladders.  Work at [employer].  Do not make important decisions or sign legal documents until you are improved.  Vomiting may occur if you eat too soon. When you can  drink without vomiting, try water, juice, or soup. Try solid foods if you feel little or no nausea.  Only take over-the-counter or prescription medications for pain, discomfort, or fever as directed by your caregiver.If pain medications have been prescribed for you, ask your caregiver how soon it is safe to take them.  Make sure you and your family fully understands everything about the medication given to you. Make sure you understand what  side effects may occur.  You should not drink alcohol, take sleeping pills, or medications that cause drowsiness for at least 24 hours.  If you smoke, do not smoke alone.  If you are feeling better, you may resume normal activities 24 hours after receiving sedation.  Keep all appointments as scheduled. Follow all instructions.  Ask questions if you do not understand. SEEK MEDICAL CARE IF:   Your skin is pale or bluish in color.  You continue to feel sick to your stomach (nauseous) or throw up (vomit).  Your pain is getting worse and not helped by medication.  You have bleeding or swelling.  You are still sleepy or feeling clumsy after 24 hours. SEEK IMMEDIATE MEDICAL CARE IF:   You develop a rash.  You have difficulty breathing.  You develop any type of allergic problem.  You have a fever. Document Released: 08/10/2001 Document Revised: 02/07/2012 Document Reviewed: 07/23/2013 [facility] Patient Information 2014 [place].

## 2013-12-17 NOTE — Procedures (Signed)
Successful right 10 fr pcn insertion No comp Stable Plan for nephroureteral conversion in 10 days Full report in pacs

## 2013-12-19 ENCOUNTER — Other Ambulatory Visit (HOSPITAL_COMMUNITY): Payer: Self-pay | Admitting: Radiology

## 2013-12-20 ENCOUNTER — Other Ambulatory Visit (HOSPITAL_COMMUNITY): Payer: Self-pay | Admitting: Radiology

## 2013-12-26 ENCOUNTER — Encounter (HOSPITAL_COMMUNITY): Payer: Self-pay | Admitting: Pharmacy Technician

## 2013-12-26 DIAGNOSIS — L409 Psoriasis, unspecified: Secondary | ICD-10-CM

## 2013-12-26 HISTORY — DX: Psoriasis, unspecified: L40.9

## 2013-12-27 ENCOUNTER — Encounter (HOSPITAL_COMMUNITY): Payer: Self-pay

## 2013-12-27 ENCOUNTER — Ambulatory Visit (HOSPITAL_COMMUNITY)
Admission: RE | Admit: 2013-12-27 | Discharge: 2013-12-27 | Disposition: A | Discharge status: Home / Self Care | Payer: Medicare Other | Source: Ambulatory Visit | Attending: Interventional Radiology | Admitting: Interventional Radiology

## 2013-12-27 ENCOUNTER — Other Ambulatory Visit: Payer: Self-pay | Admitting: Diagnostic Radiology

## 2013-12-27 DIAGNOSIS — Z8546 Personal history of malignant neoplasm of prostate: Secondary | ICD-10-CM | POA: Diagnosis not present

## 2013-12-27 DIAGNOSIS — C61 Malignant neoplasm of prostate: Secondary | ICD-10-CM

## 2013-12-27 DIAGNOSIS — I252 Old myocardial infarction: Secondary | ICD-10-CM | POA: Insufficient documentation

## 2013-12-27 DIAGNOSIS — G609 Hereditary and idiopathic neuropathy, unspecified: Secondary | ICD-10-CM | POA: Insufficient documentation

## 2013-12-27 DIAGNOSIS — N135 Crossing vessel and stricture of ureter without hydronephrosis: Secondary | ICD-10-CM | POA: Insufficient documentation

## 2013-12-27 DIAGNOSIS — Z79899 Other long term (current) drug therapy: Secondary | ICD-10-CM | POA: Diagnosis not present

## 2013-12-27 DIAGNOSIS — Z87891 Personal history of nicotine dependence: Secondary | ICD-10-CM | POA: Diagnosis not present

## 2013-12-27 DIAGNOSIS — I2510 Atherosclerotic heart disease of native coronary artery without angina pectoris: Secondary | ICD-10-CM | POA: Diagnosis not present

## 2013-12-27 DIAGNOSIS — J4489 Other specified chronic obstructive pulmonary disease: Secondary | ICD-10-CM | POA: Insufficient documentation

## 2013-12-27 DIAGNOSIS — Z436 Encounter for attention to other artificial openings of urinary tract: Secondary | ICD-10-CM | POA: Insufficient documentation

## 2013-12-27 DIAGNOSIS — J449 Chronic obstructive pulmonary disease, unspecified: Secondary | ICD-10-CM | POA: Insufficient documentation

## 2013-12-27 DIAGNOSIS — Z7982 Long term (current) use of aspirin: Secondary | ICD-10-CM | POA: Insufficient documentation

## 2013-12-27 DIAGNOSIS — E785 Hyperlipidemia, unspecified: Secondary | ICD-10-CM | POA: Diagnosis not present

## 2013-12-27 HISTORY — DX: Psoriasis, unspecified: L40.9

## 2013-12-27 LAB — CBC
HCT: 41.4 % (ref 39.0–52.0)
Hemoglobin: 14.7 g/dL (ref 13.0–17.0)
MCH: 33.3 pg (ref 26.0–34.0)
MCHC: 35.5 g/dL (ref 30.0–36.0)
MCV: 93.7 fL (ref 78.0–100.0)
Platelets: 200 10*3/uL (ref 150–400)
RBC: 4.42 MIL/uL (ref 4.22–5.81)
RDW: 13.2 % (ref 11.5–15.5)
WBC: 5.4 10*3/uL (ref 4.0–10.5)

## 2013-12-27 LAB — BASIC METABOLIC PANEL
BUN: 29 mg/dL — ABNORMAL HIGH (ref 6–23)
CHLORIDE: 98 meq/L (ref 96–112)
CO2: 24 mEq/L (ref 19–32)
Calcium: 9.2 mg/dL (ref 8.4–10.5)
Creatinine, Ser: 1.3 mg/dL (ref 0.50–1.35)
GFR calc non Af Amer: 50 mL/min — ABNORMAL LOW (ref >90)
GFR, EST AFRICAN AMERICAN: 58 mL/min — AB (ref >90)
Glucose, Bld: 79 mg/dL (ref 70–99)
Potassium: 5 mEq/L (ref 3.7–5.3)
Sodium: 135 mEq/L — ABNORMAL LOW (ref 137–147)

## 2013-12-27 LAB — PROTIME-INR
INR: 1.11 (ref 0.00–1.49)
Prothrombin Time: 14.1 seconds (ref 11.6–15.2)

## 2013-12-27 LAB — APTT: aPTT: 32 seconds (ref 24–37)

## 2013-12-27 MED ORDER — MIDAZOLAM HCL 2 MG/2ML IJ SOLN
INTRAMUSCULAR | Status: AC
  Filled 2013-12-27: qty 6

## 2013-12-27 MED ORDER — SODIUM CHLORIDE 0.9 % IV SOLN
Freq: Once | INTRAVENOUS | Status: AC
  Administered 2013-12-27: 12:00:00 via INTRAVENOUS

## 2013-12-27 MED ORDER — IOHEXOL 300 MG/ML  SOLN
20.0000 mL | Freq: Once | INTRAMUSCULAR | Status: AC | PRN
  Administered 2013-12-27: 20 mL

## 2013-12-27 MED ORDER — LIDOCAINE HCL 1 % IJ SOLN
INTRAMUSCULAR | Status: AC
  Filled 2013-12-27: qty 20

## 2013-12-27 MED ORDER — FENTANYL CITRATE 0.05 MG/ML IJ SOLN
INTRAMUSCULAR | Status: AC
  Filled 2013-12-27: qty 6

## 2013-12-27 MED ORDER — FENTANYL CITRATE 0.05 MG/ML IJ SOLN
INTRAMUSCULAR | Status: AC | PRN
  Administered 2013-12-27: 50 ug via INTRAVENOUS

## 2013-12-27 MED ORDER — CIPROFLOXACIN IN D5W 400 MG/200ML IV SOLN
400.0000 mg | Freq: Once | INTRAVENOUS | Status: AC
  Administered 2013-12-27: 400 mg via INTRAVENOUS
  Filled 2013-12-27: qty 200

## 2013-12-27 MED ORDER — HYDROCODONE-ACETAMINOPHEN 5-325 MG PO TABS: 1.0000 | ORAL_TABLET | ORAL | Status: DC | PRN

## 2013-12-27 MED ORDER — MIDAZOLAM HCL 2 MG/2ML IJ SOLN
INTRAMUSCULAR | Status: AC | PRN
  Administered 2013-12-27: 0.5 mg via INTRAVENOUS
  Administered 2013-12-27: 1 mg via INTRAVENOUS
  Administered 2013-12-27: 0.5 mg via INTRAVENOUS
  Administered 2013-12-27: 1 mg via INTRAVENOUS

## 2013-12-27 MED ORDER — CIPROFLOXACIN IN D5W 400 MG/200ML IV SOLN
INTRAVENOUS | Status: AC
  Filled 2013-12-27: qty 200

## 2013-12-27 NOTE — H&P (Signed)
Michael Novak is an 80 y.o. male.   Chief Complaint: "I'm here to have my internal catheter put back in" HPI: Patient with history of prostate carcinoma/prior cystectomy with ileal conduit (for rectourethral fistula) and recent right percutaneous nephrostomy  secondary to dislodgement of nephroureteral catheter presents today for nephroureteral conversion.  Past Medical History  Diagnosis Date  . History of colostomy   . Urostomy stenosis     Ask patient to clarify, per history form dated 04/02/11.  . Prostate cancer   . Skin cancer   . Neuropathy     in feet   . COPD (chronic obstructive pulmonary disease)   . Depression   . UTI (lower urinary tract infection)     history   . AAA (abdominal aortic aneurysm)   . 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 Dyslipidemia 12/14/2011  . Bradycardia, HR 30-40s, Nov-Dec2012 11/26/2011  . 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 Loss of weight 08/22/2012  . Seborrhea capitis 02/29/2012  . Unspecified erythematous condition 02/29/2012  . Thrombocytopenia, unspecified 12/22/2011  . Acute kidney failure, unspecified 12/22/2011  . Pyelonephritis, unspecified 12/22/2011  . Inguinal hernia without mention of obstruction or gangrene, unilateral or unspecified, (not specified as recurrent) 12/01/2011  . Chest pain, unspecified 12/01/2011  . Acute upper respiratory infections of unspecified site 10/02/2009  . Sebaceous cyst 01/22/2009  . Carbuncle and furuncle of buttock 01/08/2009  . Ventral hernia, unspecified, without mention of obstruction or gangrene 12/04/2008  . Incisional hernia without mention of obstruction or gangrene 03/20/2008  . Gout, unspecified 02/15/2008  . Other malaise and fatigue 10.24/2008  . Senile cataract, unspecified 02/18/2006  . Pain in joint, multiple sites 08/30/2005  . Diverticulosis of colon (without mention of hemorrhage) 02/11/2005  . Lumbago 09/25/2003  . Edema 09/25/2003  .  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 Unspecified hereditary and idiopathic peripheral neuropathy 08/10/2000  . Coronary atherosclerosis of unspecified type of vessel, native or graft 08/10/1996  . Acute myocardial infarction, unspecified site, episode of care unspecified 11/30/1995  . Lyme disease 11/30/1987    Past Surgical History  Procedure Laterality Date  . Cystectomy, ileal coduit, colostomy  2002    for severe rectourethral fistula  . Ex lap, lysis of adhesions  2010    for SBO  . Lap repair of parastomal hernias  2009    Dr. Alvan Dame [facility],  Hematoma evac POD#1  . Eye surgery      cataracts removed  . Cardiac catheterization  07/04/2001  . Colostomy  08/03/2001    Rise Patience, MD  . Lucianne Lei  Garfield Cornea, MD  . Hernia repair  1970    x 4  . Clavicle surgery  1952    Separation    Family History  Problem Relation Age of Onset  . Heart disease Sister   . Heart disease Brother   . Cancer Brother   . Heart disease Sister   . Kidney disease Sister   . Cancer Sister    Social History:  reports that he has quit smoking. His smoking use included Cigarettes. He smoked 0.00 packs per day for 37 years. He has never used smokeless tobacco. He reports that he drinks about 0.6 ounces of alcohol per week. He reports that he does not use illicit drugs.  Allergies:  Allergies  Allergen Reactions  . Augmentin [Amoxicillin-Pot Clavulanate] Other (See Comments)    Reaction unknown.    Current outpatient  prescriptions:acetaminophen (TYLENOL) 500 MG tablet, Take 1,000 mg by mouth every 6 (six) hours as needed. For pain, Disp: , Rfl: ;  albuterol (PROVENTIL HFA;VENTOLIN HFA) 108 (90 BASE) MCG/ACT inhaler, Inhale 2 puffs into the lungs every 4 (four) hours as needed for shortness of breath. , Disp: , Rfl: ;  aspirin EC 81 MG tablet, Take 81 mg by mouth at bedtime. , Disp: , Rfl:  buPROPion (WELLBUTRIN SR) 150 MG 12 hr tablet, Take 150 mg by mouth 3 (three) times daily. ,  Disp: , Rfl: ;  Calcium-Magnesium-Vitamin D (CALCIUM MAGNESIUM PO), Take 1 tablet by mouth every morning. , Disp: , Rfl: ;  gabapentin (NEURONTIN) 300 MG capsule, Take 300 mg by mouth 3 (three) times daily. Take 1 tablet twice daily  And 2 tablets at bedtime to treat neuropathy., Disp: , Rfl:  glucosamine-chondroitin 500-400 MG tablet, Take 1 tablet by mouth 2 (two) times daily. , Disp: , Rfl: ;  hydrocortisone 2.5 % cream, Apply 1 application topically 2 (two) times daily as needed. hemorrhoids, Disp: , Rfl: ;  ketoconazole (NIZORAL) 2 % shampoo, Apply 1 application topically 2 (two) times a week. , Disp: , Rfl:  methenamine (HIPREX) 1 G tablet, Take 1 g by mouth daily as needed. Taken if patient has kidney infection. Alternating with Cipro., Disp: , Rfl: ;  Multiple Vitamin (MULTIVITAMIN WITH MINERALS) TABS, Take 1 tablet by mouth every morning. , Disp: , Rfl: ;  nitrofurantoin (MACRODANTIN) 50 MG capsule, Take 50 mg by mouth at bedtime., Disp: , Rfl:  selenium sulfide (SELSUN) 2.5 % shampoo, Apply 1 application topically daily as needed (Applies to scalp for dandruff or itching.). , Disp: , Rfl: ;  simvastatin (ZOCOR) 20 MG tablet, Take 20 mg by mouth at bedtime.  , Disp: , Rfl: ;  sodium chloride (OCEAN) 0.65 % nasal spray, Place 1 spray into the nose daily as needed. For nasal decongestant, Disp: , Rfl: ;  sorbitol 70 % solution, Take 15 mLs by mouth daily as needed. constipation, Disp: , Rfl:  tiotropium (SPIRIVA) 18 MCG inhalation capsule, Place 18 mcg into inhaler and inhale at bedtime., Disp: , Rfl: ;  tiotropium (SPIRIVA) 18 MCG inhalation capsule, Place 18 mcg into inhaler and inhale daily., Disp: , Rfl: ;  triamcinolone cream (KENALOG) 0.1 %, APPLY NIGHTLY TO IRRITATED SCALP 3 NIGHTS PER WEEK. SHAMPOO IN THE MORNING., Disp: 85.2 g, Rfl: 0 triamcinolone cream (KENALOG) 0.1 %, Apply 1 application topically 2 (two) times daily., Disp: , Rfl: ;  vitamin C (ASCORBIC ACID) 500 MG tablet, Take 500 mg by  mouth daily., Disp: , Rfl:  Current facility-administered medications:ciprofloxacin (CIPRO) IVPB 400 mg, 400 mg, Intravenous, Once, Lavonia Drafts, PA-C  No results found for this or any previous visit (from the past 48 hour(s)). No results found.  Review of Systems  Constitutional: Negative for fever and chills.  Respiratory: Negative for cough.   Cardiovascular: Negative for chest pain.  Gastrointestinal: Negative for nausea, vomiting and abdominal pain.  Genitourinary: Negative for hematuria.       Mild rt flank discomfort  Neurological: Negative for headaches.  Endo/Heme/Allergies: Does not bruise/bleed easily.   12/27/13 WBC 5.4  HGB 14.7  PLTS  200 K  PT/PTT PENDING Results for orders placed during the hospital encounter of 12/17/13  CBC      Result Value Range   WBC 4.9  4.0 - 10.5 K/uL   RBC 4.36  4.22 - 5.81 MIL/uL   Hemoglobin 14.5  13.0 -  17.0 g/dL   HCT 40.3  39.0 - 52.0 %   MCV 92.4  78.0 - 100.0 fL   MCH 33.3  26.0 - 34.0 pg   MCHC 36.0  30.0 - 36.0 g/dL   RDW 13.0  11.5 - 15.5 %   Platelets 186  150 - 400 K/uL  PROTIME-INR      Result Value Range   Prothrombin Time 14.5  11.6 - 15.2 seconds   INR 1.15  0.00 - 1.49  APTT      Result Value Range   aPTT 35  24 - 37 seconds  BASIC METABOLIC PANEL      Result Value Range   Sodium 138  137 - 147 mEq/L   Potassium 4.7  3.7 - 5.3 mEq/L   Chloride 101  96 - 112 mEq/L   CO2 22  19 - 32 mEq/L   Glucose, Bld 89  70 - 99 mg/dL   BUN 25 (*) 6 - 23 mg/dL   Creatinine, Ser 1.44 (*) 0.50 - 1.35 mg/dL   Calcium 9.4  8.4 - 10.5 mg/dL   GFR calc non Af Amer 44 (*) >90 mL/min   GFR calc Af Amer 51 (*) >90 mL/min   Vitals: BP 142/85  HR 56  R 16  O2 SATS 95% RA  TEMP 96.9 Physical Exam  Constitutional: He is oriented to person, place, and time. He appears well-developed and well-nourished.  Cardiovascular: Normal rate and regular rhythm.   Respiratory: Effort normal and breath sounds normal.  GI: Soft. Bowel sounds are  normal. There is no tenderness.  Intact RLQ urostomy, LLQ ostomy, right PCN; yellow urine in urostomy and PCN bags  Musculoskeletal: Normal range of motion. He exhibits no edema.  Neurological: He is alert and oriented to person, place, and time.     Assessment/Plan Patient with history of prostate carcinoma/prior cystectomy with ileal conduit (for rectourethral fistula) and recent right percutaneous nephrostomy  secondary to dislodgement of nephroureteral catheter presents today for nephroureteral conversion. Details/risks of procedure d/w pt with his understanding and consent.  ALLRED,D KEVIN 12/27/2013, 12:00 PM

## 2013-12-27 NOTE — Discharge Instructions (Signed)
Percutaneous Nephrostomy, Care After Refer to this sheet in the next few weeks. These instructions provide you with information on caring for yourself after your procedure. Your health care provider may also give you more specific instructions. Your treatment has been planned according to current medical practices, but problems sometimes occur. Call your health care provider if you have any problems or questions after your procedure. WHAT TO EXPECT AFTER THE PROCEDURE  You will need to remain lying down for several hours. Some people are able to leave the hospital the same day, others stay overnight. HOME CARE INSTRUCTIONS  Your nephrostomy tube is connected to a leg bag or bedside drainage bag. Always keep the tubing, the leg bag, or the bedside drainage bags below the level of the kidney so that the urine drains freely.  During the day, if you are connecting the nephrostomy tube to a leg bag, be sure there are no kinks in the tubing and that the urine is draining freely.  At night, you may want to connect the nephrostomy tube or the leg bag to a larger bedside drainage bag.  Change the dressing as often as directed by your health care provider or if it becomes wet.  Gently remove the tapes and dressing from around the nephrostomy tube. Be careful not to pull on the tube while removing the dressing.  Wash the skin around the tube, rinse well and dry.  Place two split drain sponges in and around the tube exit site.  Place tape around edge of the dressing.  Secure the nephrostomy tubing. Remember to make certain that the nephrostomy tube does not kink or become pinched closed. It can be useful to wrap any exposed tubing going from the nephrostomy tube to any of the connecting tubes to either the leg bag or drainage bag with an elastic bandage.  Every three weeks, replace the leg bag, drainage bag, and any extension tubing connected to your nephrostomy tube. Your health care provider will  explain how to change the drainage bag and extension tubing. SEEK MEDICAL CARE IF:  You experience any problems with any of the valves or tubing.  You have persistent pain or soreness in your back. SEEK IMMEDIATE MEDICAL CARE IF:  You have a fever or chills.  You have abdominal pain during the first week.  You have a new appearance of blood in urine.  You have back pain that is not relieved by your medicine.  You have redness, swelling, or drainage at the tube insertion site.  You have decreased urine output.  Your nephrostomy tube comes out. Document Released: 07/08/2004 Document Revised: 09/05/2013 Document Reviewed: 07/12/2013 [facility] Patient Information 2014 [place], [place]. Moderate Sedation, Adult Moderate sedation is given to help you relax or even sleep through a procedure. You may remain sleepy, be clumsy, or have poor balance for several hours following this procedure. Arrange for a responsible adult, family member, or friend to take you home. A responsible adult should stay with you for at least 24 hours or until the medicines have worn off.  Do not participate in any activities where you could become injured for the next 24 hours, or until you feel normal again. Do not:  Drive.  Swim.  Ride a bicycle.  Operate heavy machinery.  Cook.  Use power tools.  Climb ladders.  Work at [employer].  Do not make important decisions or sign legal documents until you are improved.  Vomiting may occur if you eat too soon. When you can  drink without vomiting, try water, juice, or soup. Try solid foods if you feel little or no nausea.  Only take over-the-counter or prescription medications for pain, discomfort, or fever as directed by your caregiver.If pain medications have been prescribed for you, ask your caregiver how soon it is safe to take them.  Make sure you and your family fully understands everything about the medication given to you. Make sure you understand what  side effects may occur.  You should not drink alcohol, take sleeping pills, or medications that cause drowsiness for at least 24 hours.  If you smoke, do not smoke alone.  If you are feeling better, you may resume normal activities 24 hours after receiving sedation.  Keep all appointments as scheduled. Follow all instructions.  Ask questions if you do not understand. SEEK MEDICAL CARE IF:   Your skin is pale or bluish in color.  You continue to feel sick to your stomach (nauseous) or throw up (vomit).  Your pain is getting worse and not helped by medication.  You have bleeding or swelling.  You are still sleepy or feeling clumsy after 24 hours. SEEK IMMEDIATE MEDICAL CARE IF:   You develop a rash.  You have difficulty breathing.  You develop any type of allergic problem.  You have a fever. Document Released: 08/10/2001 Document Revised: 02/07/2012 Document Reviewed: 07/23/2013 [facility] Patient Information 2014 [place].

## 2013-12-27 NOTE — Procedures (Signed)
Convert R perc neph to retrograde 34F nephroureteral No complication No blood loss. See complete dictation in [facility].

## 2014-01-01 ENCOUNTER — Ambulatory Visit (INDEPENDENT_AMBULATORY_CARE_PROVIDER_SITE_OTHER): Payer: Medicare Other | Admitting: Internal Medicine

## 2014-01-01 ENCOUNTER — Encounter: Payer: Self-pay | Admitting: Internal Medicine

## 2014-01-01 VITALS — BP 110/62 | HR 66 | Temp 97.5°F | Resp 10 | Wt 193.0 lb

## 2014-01-01 DIAGNOSIS — J4489 Other specified chronic obstructive pulmonary disease: Secondary | ICD-10-CM

## 2014-01-01 DIAGNOSIS — J449 Chronic obstructive pulmonary disease, unspecified: Secondary | ICD-10-CM

## 2014-01-01 DIAGNOSIS — IMO0002 Reserved for concepts with insufficient information to code with codable children: Secondary | ICD-10-CM

## 2014-01-01 DIAGNOSIS — N135 Crossing vessel and stricture of ureter without hydronephrosis: Secondary | ICD-10-CM

## 2014-01-01 DIAGNOSIS — E785 Hyperlipidemia, unspecified: Secondary | ICD-10-CM

## 2014-01-01 DIAGNOSIS — K9409 Other complications of colostomy: Secondary | ICD-10-CM

## 2014-01-01 MED ORDER — SIMVASTATIN 20 MG PO TABS: ORAL_TABLET | ORAL | Status: DC

## 2014-01-01 NOTE — Progress Notes (Signed)
Patient ID: Michael Novak, male   DOB: June 29, 1930, 80 y.o.   MRN: [id number]    Location:    [facility]  Place of Service:  OFFICE    Allergies  Allergen Reactions  . Augmentin [Amoxicillin-Pot Clavulanate] Other (See Comments)    Reaction unknown.    Chief Complaint  Patient presents with  . Medical Managment of Chronic Issues    4 month follow-up   . Cold feeling    Patient always cold x couple months   . 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 Painless.  Other and unspecified hyperlipidemia: needs lab  Dyslipidemia - Plan: simvastatin (ZOCOR) 20 MG tablet, CMP, CBC With differential/Platelet, Lipid panel  COPD (chronic obstructive pulmonary disease): stable. Denies dyspnea.  Wife says he complains of being cold all the time. House is set at 73 degrees. No fever.    Medications: Patient's Medications  New Prescriptions   No medications on file  Previous Medications   ACETAMINOPHEN (TYLENOL) 500 MG TABLET    Take 1,000 mg by mouth every 6 (six) hours as needed. For pain   ALBUTEROL (PROVENTIL HFA;VENTOLIN HFA) 108 (90 BASE) MCG/ACT INHALER    Inhale 2 puffs into the lungs every 4 (four) hours as needed for shortness of breath.    ASPIRIN EC 81 MG TABLET    Take 81 mg by mouth at bedtime.    BUPROPION (WELLBUTRIN SR) 150 MG 12 HR TABLET    Take 150 mg by mouth 3 (three) times daily.    CALCIUM-MAGNESIUM-VITAMIN D (CALCIUM MAGNESIUM PO)    Take 1 tablet by mouth every morning.    GABAPENTIN (NEURONTIN) 300 MG CAPSULE    Take 300 mg by mouth 3 (three) times daily. Take 1 tablet twice daily  And 2 tablets at bedtime to treat neuropathy.   GLUCOSAMINE-CHONDROITIN 500-400 MG TABLET    Take 1 tablet by mouth 2 (two) times daily.    HYDROCORTISONE 2.5 % CREAM    Apply 1 application topically 2 (two) times  daily as needed. hemorrhoids   KETOCONAZOLE (NIZORAL) 2 % SHAMPOO    Apply 1 application topically 2 (two) times a week.    METHENAMINE (HIPREX) 1 G TABLET    Take 1 g by mouth daily as needed. Taken if patient has kidney infection. Alternating with Cipro.   MULTIPLE VITAMIN (MULTIVITAMIN WITH MINERALS) TABS    Take 1 tablet by mouth every morning.    NITROFURANTOIN (MACRODANTIN) 50 MG CAPSULE    Take 50 mg by mouth at bedtime.   SELENIUM SULFIDE (SELSUN) 2.5 % SHAMPOO    Apply 1 application topically daily as needed (Applies to scalp for dandruff or itching.).    SIMVASTATIN (ZOCOR) 20 MG TABLET    Take 20 mg by mouth at bedtime.     SODIUM CHLORIDE (OCEAN) 0.65 % NASAL SPRAY    Place 1 spray into the nose daily as needed. For nasal decongestant   SORBITOL 70 % SOLUTION    Take 15 mLs by mouth daily as needed. constipation   TIOTROPIUM (SPIRIVA) 18 MCG INHALATION CAPSULE    Place 18 mcg into inhaler and inhale daily.   TRIAMCINOLONE CREAM (KENALOG) 0.1 %    APPLY NIGHTLY TO IRRITATED SCALP 3 NIGHTS PER WEEK. SHAMPOO IN THE MORNING.   VITAMIN C (ASCORBIC ACID) 500 MG TABLET  Take 500 mg by mouth daily.  Modified Medications   No medications on file  Discontinued Medications   TIOTROPIUM (SPIRIVA) 18 MCG INHALATION CAPSULE    Place 18 mcg into inhaler and inhale at bedtime.   TRIAMCINOLONE CREAM (KENALOG) 0.1 %    Apply 1 application topically 2 (two) times daily.     Review of Systems  Constitutional: Positive for fatigue. Negative for fever, chills and unexpected weight change.       Stays cold even though the house is 73 degrees.  HENT: Negative.   Eyes: Negative.   Respiratory: Negative.   Cardiovascular: Negative for chest pain, palpitations and leg swelling.  Gastrointestinal: Negative for abdominal pain and abdominal distention.       Colostomy. Pericolostomy ventral hernia.  Endocrine: Negative.   Genitourinary: Positive for urgency and frequency.  Musculoskeletal: Positive  for arthralgias and back pain.  Skin: Negative.   Allergic/Immunologic: Negative.   Neurological: Positive for dizziness.  Hematological:       History of anemia.  Psychiatric/Behavioral: Negative.     Filed Vitals:   01/01/14 1433  BP: 110/62  Pulse: 66  Temp: 97.5 F (36.4 C)  TempSrc: Oral  Resp: 10  Weight: 193 lb (87.544 kg)  SpO2: 94%   Physical Exam  Constitutional: He is oriented to person, place, and time. He appears well-nourished. No distress.  HENT:  Head: Normocephalic.  Right Ear: External ear normal.  Left Ear: External ear normal.  Nose: Nose normal.  Eyes: Conjunctivae and EOM are normal. Pupils are equal, round, and reactive to light.  Neck: Normal range of motion. Neck supple. No JVD present. No tracheal deviation present. No thyromegaly present.  Cardiovascular: Normal rate, regular rhythm, normal heart sounds and intact distal pulses.  Exam reveals no gallop and no friction rub.   No murmur heard. Pulmonary/Chest: Breath sounds normal. No respiratory distress. He has no wheezes. He has no rales. He exhibits no tenderness.  Abdominal: Soft. Bowel sounds are normal. He exhibits no distension and no mass. There is no tenderness.  Colostomy left lower quadrant.. Pericolostomy ventral hernia.  Musculoskeletal: Normal range of motion. He exhibits no edema and no tenderness.  Lymphadenopathy:    He has no cervical adenopathy.  Neurological: He is alert and oriented to person, place, and time. No cranial nerve deficit. Coordination normal.  Skin: No rash noted. No erythema. No pallor.  Psychiatric: He has a normal mood and affect. His behavior is normal. Judgment and thought content normal.     Labs reviewed: Hospital Outpatient Visit on 12/27/2013  Component Date Value Range Status  . aPTT 12/27/2013 32  24 - 37 seconds Final  . Sodium 12/27/2013 135* 137 - 147 mEq/L Final  . Potassium 12/27/2013 5.0  3.7 - 5.3 mEq/L Final  . Chloride 12/27/2013 98  96 -  112 mEq/L Final  . CO2 12/27/2013 24  19 - 32 mEq/L Final  . Glucose, Bld 12/27/2013 79  70 - 99 mg/dL Final  . BUN 12/27/2013 29* 6 - 23 mg/dL Final  . Creatinine, Ser 12/27/2013 1.30  0.50 - 1.35 mg/dL Final  . Calcium 12/27/2013 9.2  8.4 - 10.5 mg/dL Final  . GFR calc non Af Amer 12/27/2013 50* >90 mL/min Final  . GFR calc Af Amer 12/27/2013 58* >90 mL/min Final   Comment: (NOTE)                          The eGFR  has been calculated using the CKD EPI equation.                          This calculation has not been validated in all clinical situations.                          eGFR's persistently <90 mL/min signify possible Chronic Kidney                          Disease.  . WBC 12/27/2013 5.4  4.0 - 10.5 K/uL Final  . RBC 12/27/2013 4.42  4.22 - 5.81 MIL/uL Final  . Hemoglobin 12/27/2013 14.7  13.0 - 17.0 g/dL Final  . HCT 12/27/2013 41.4  39.0 - 52.0 % Final  . MCV 12/27/2013 93.7  78.0 - 100.0 fL Final  . MCH 12/27/2013 33.3  26.0 - 34.0 pg Final  . MCHC 12/27/2013 35.5  30.0 - 36.0 g/dL Final  . RDW 12/27/2013 13.2  11.5 - 15.5 % Final  . Platelets 12/27/2013 200  150 - 400 K/uL Final  . Prothrombin Time 12/27/2013 14.1  11.6 - 15.2 seconds Final  . INR 12/27/2013 1.11  0.00 - 1.49 Final  Hospital Outpatient Visit on 12/17/2013  Component Date Value Range Status  . WBC 12/17/2013 4.9  4.0 - 10.5 K/uL Final  . RBC 12/17/2013 4.36  4.22 - 5.81 MIL/uL Final  . Hemoglobin 12/17/2013 14.5  13.0 - 17.0 g/dL Final  . HCT 12/17/2013 40.3  39.0 - 52.0 % Final  . MCV 12/17/2013 92.4  78.0 - 100.0 fL Final  . MCH 12/17/2013 33.3  26.0 - 34.0 pg Final  . MCHC 12/17/2013 36.0  30.0 - 36.0 g/dL Final  . RDW 12/17/2013 13.0  11.5 - 15.5 % Final  . Platelets 12/17/2013 186  150 - 400 K/uL Final  . Prothrombin Time 12/17/2013 14.5  11.6 - 15.2 seconds Final  . INR 12/17/2013 1.15  0.00 - 1.49 Final  . aPTT 12/17/2013 35  24 - 37 seconds Final  . Sodium 12/17/2013 138  137 - 147 mEq/L Final   . Potassium 12/17/2013 4.7  3.7 - 5.3 mEq/L Final  . Chloride 12/17/2013 101  96 - 112 mEq/L Final  . CO2 12/17/2013 22  19 - 32 mEq/L Final  . Glucose, Bld 12/17/2013 89  70 - 99 mg/dL Final  . BUN 12/17/2013 25* 6 - 23 mg/dL Final  . Creatinine, Ser 12/17/2013 1.44* 0.50 - 1.35 mg/dL Final  . Calcium 12/17/2013 9.4  8.4 - 10.5 mg/dL Final  . GFR calc non Af Amer 12/17/2013 44* >90 mL/min Final  . GFR calc Af Amer 12/17/2013 51* >90 mL/min Final   Comment: (NOTE)                          The eGFR has been calculated using the CKD EPI equation.                          This calculation has not been validated in all clinical situations.                          eGFR's persistently <90 mL/min signify possible Chronic Kidney  Disease.      Assessment/Plan  1. Ureteral stricture, right with chronic nephroureteral stenting : functioning OK. Nephrostomy tube had been in the left flank recently. Area is healing. - CMP; Future  2. Parastomal hernia at left colostomy unchanged  3. Other and unspecified hyperlipidemia Needs lab  4. Dyslipidemia - simvastatin (ZOCOR) 20 MG tablet; One nightly to control cholesterol  Dispense: 90 tablet; Refill: 1 - CMP; Future - CBC With differential/Platelet; Future - Lipid panel; Future  5. COPD (chronic obstructive pulmonary disease) stable

## 2014-01-01 NOTE — Patient Instructions (Signed)
Continue current medications. 

## 2014-01-09 ENCOUNTER — Other Ambulatory Visit (HOSPITAL_COMMUNITY): Payer: Medicare Other

## 2014-02-07 ENCOUNTER — Ambulatory Visit (HOSPITAL_COMMUNITY)
Admission: RE | Admit: 2014-02-07 | Discharge: 2014-02-07 | Disposition: A | Discharge status: Home / Self Care | Payer: Medicare Other | Source: Ambulatory Visit | Attending: Diagnostic Radiology | Admitting: Diagnostic Radiology

## 2014-02-07 ENCOUNTER — Other Ambulatory Visit (HOSPITAL_COMMUNITY): Payer: Self-pay | Admitting: Interventional Radiology

## 2014-02-07 ENCOUNTER — Other Ambulatory Visit (HOSPITAL_COMMUNITY): Payer: Self-pay | Admitting: Diagnostic Radiology

## 2014-02-07 DIAGNOSIS — N1330 Unspecified hydronephrosis: Secondary | ICD-10-CM

## 2014-02-07 DIAGNOSIS — N135 Crossing vessel and stricture of ureter without hydronephrosis: Secondary | ICD-10-CM | POA: Insufficient documentation

## 2014-02-07 DIAGNOSIS — Z8546 Personal history of malignant neoplasm of prostate: Secondary | ICD-10-CM

## 2014-02-07 DIAGNOSIS — C61 Malignant neoplasm of prostate: Secondary | ICD-10-CM

## 2014-02-07 MED ORDER — IOHEXOL 300 MG/ML  SOLN
10.0000 mL | Freq: Once | INTRAMUSCULAR | Status: AC | PRN
  Administered 2014-02-07: 10 mL

## 2014-02-18 IMAGING — XA IR BILIARY CATHETER EXCHANGE
1 series · 4 of 4 positions shown · non-contrast
Comparison: none

CLINICAL DATA: History of chronic indwelling right-sided
nephroureteral catheter placement via ileostomy due to right
ureteral stricture.

[Series 300: tube placements · 4 of 4 slices shown]
[im 1/4]
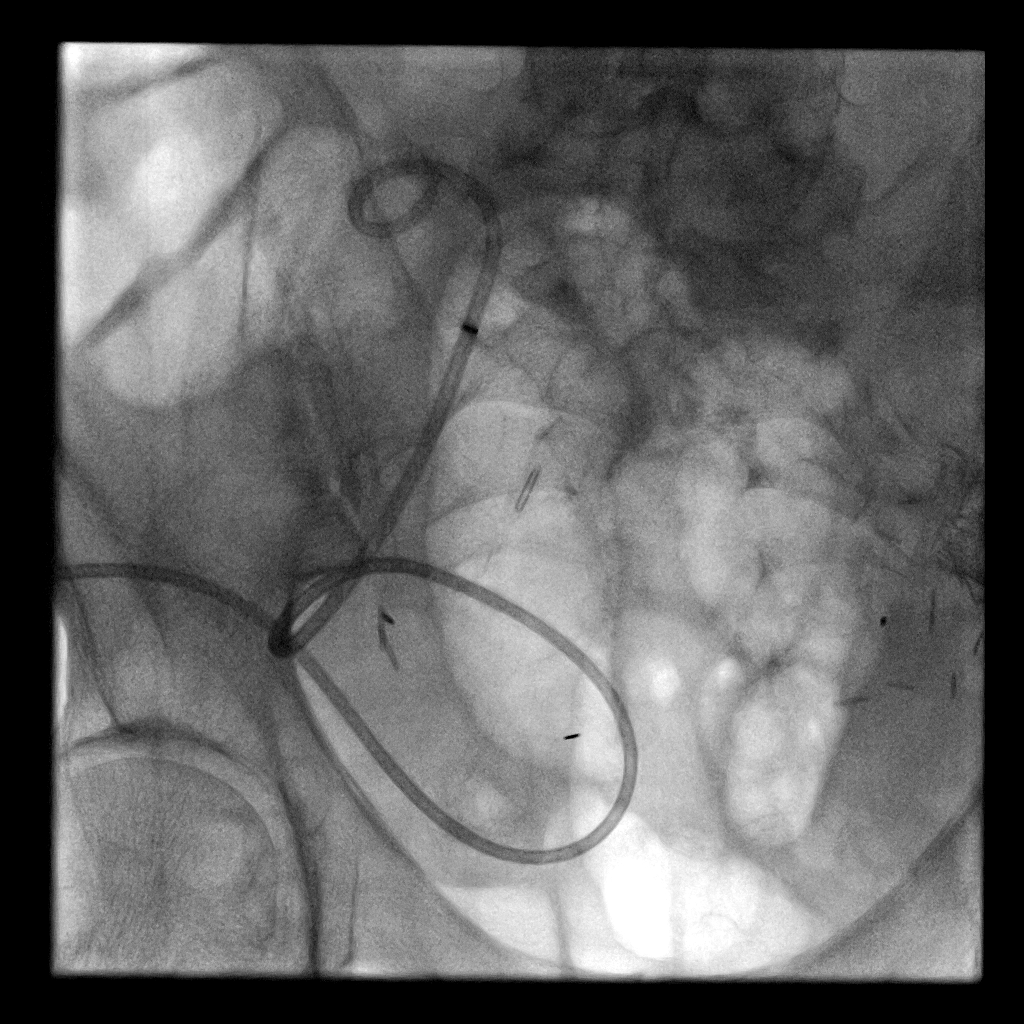
[im 2/4]
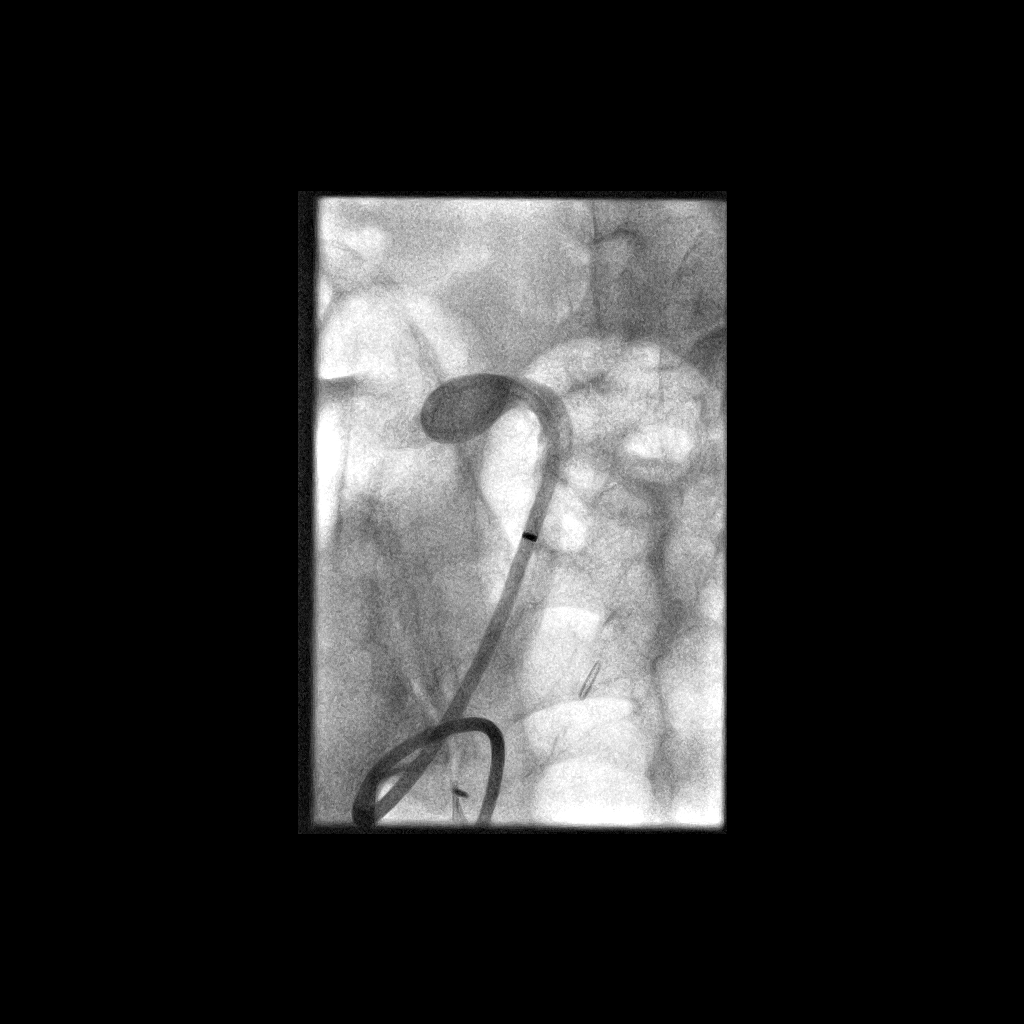
[im 3/4]
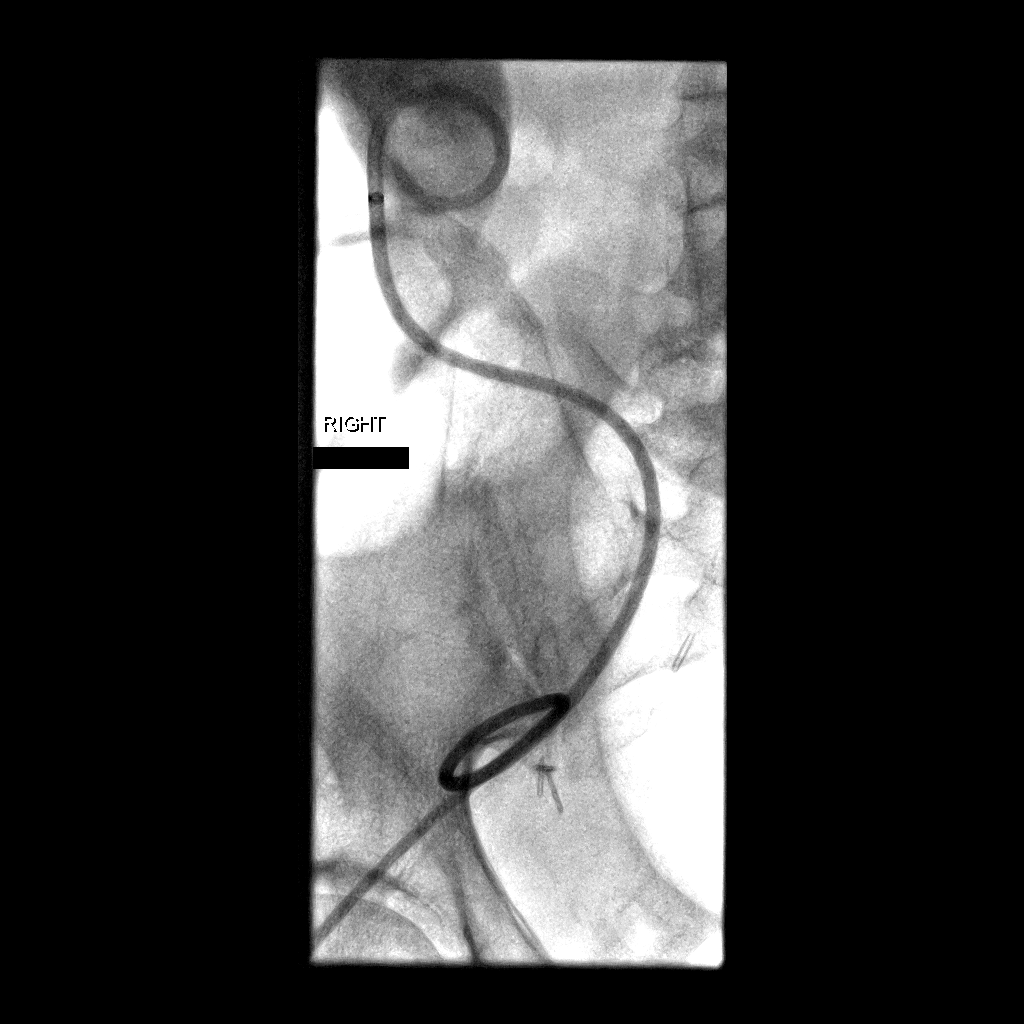
[im 4/4]
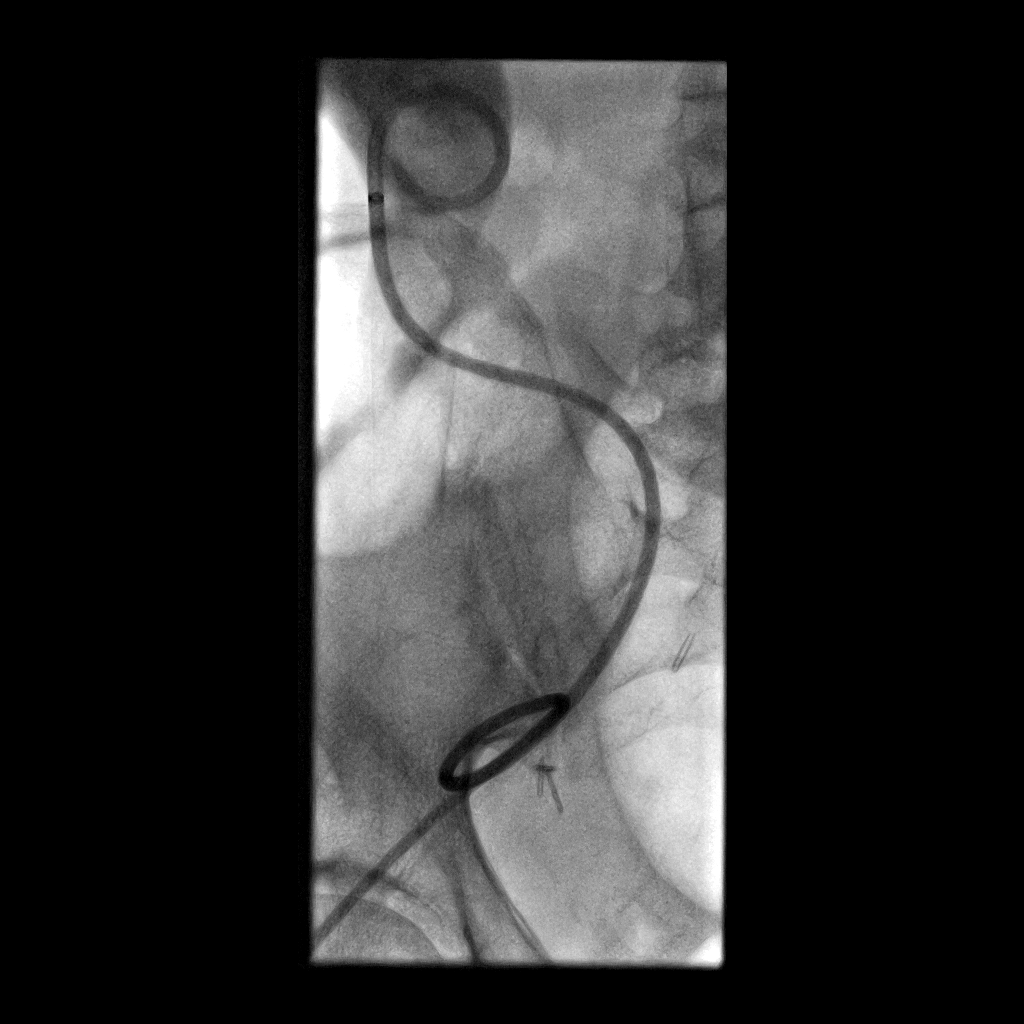

[4 of 4 positions shown; findings below may reference images not displayed]

RIGHT-SIDED NEPHROURETERAL CATHETER EXCHANGE

Contrast:  10 ml [id number]-XWW

Fluoroscopy Time: 30 seconds

Procedure:  The procedure, risks, benefits, and alternatives were
explained to the patient.  Questions regarding the procedure were
encouraged and answered.  The patient understands and consents to
the procedure.

The preexisting catheter and surrounding ostomy site was prepped
with Betadine in a sterile fashion, and a sterile drape was applied
covering the operative field.  A sterile gown and sterile gloves
were used for the procedure.

The preexisting catheter was injected with contrast material under
fluoroscopy.  It was then removed over a guidewire.  A new 10-
French nephroureteral catheter was advanced over a wire.

Final catheter position was confirmed with a fluoroscopic spot
image obtained after injection of contrast.

Complications:  None
FINDINGS: The preexisting catheter retracted slightly into the
proximal ureter. A new catheter was placed with the distal portion
formed at the level of the right renal pelvis.
IMPRESSION: Routine exchange of chronic indwelling right-sided nephroureteral
catheter via ileostomy.

## 2014-02-20 ENCOUNTER — Other Ambulatory Visit: Payer: Self-pay | Admitting: *Deleted

## 2014-02-20 DIAGNOSIS — E785 Hyperlipidemia, unspecified: Secondary | ICD-10-CM

## 2014-02-20 MED ORDER — ALBUTEROL SULFATE HFA 108 (90 BASE) MCG/ACT IN AERS: 2.0000 | INHALATION_SPRAY | RESPIRATORY_TRACT | Status: DC | PRN

## 2014-02-20 MED ORDER — TIOTROPIUM BROMIDE MONOHYDRATE 18 MCG IN CAPS: 18.0000 ug | ORAL_CAPSULE | Freq: Every day | RESPIRATORY_TRACT | Status: AC

## 2014-02-20 MED ORDER — NITROFURANTOIN MACROCRYSTAL 50 MG PO CAPS: 50.0000 mg | ORAL_CAPSULE | Freq: Every day | ORAL | Status: DC

## 2014-02-20 MED ORDER — TRIAMCINOLONE ACETONIDE 0.1 % EX CREA: TOPICAL_CREAM | CUTANEOUS | Status: DC

## 2014-02-20 MED ORDER — KETOCONAZOLE 2 % EX SHAM: 1.0000 "application " | MEDICATED_SHAMPOO | CUTANEOUS | Status: AC

## 2014-02-20 MED ORDER — BUPROPION HCL ER (SR) 150 MG PO TB12: 150.0000 mg | ORAL_TABLET | Freq: Three times a day (TID) | ORAL | Status: AC

## 2014-02-20 MED ORDER — ALBUTEROL SULFATE HFA 108 (90 BASE) MCG/ACT IN AERS: 2.0000 | INHALATION_SPRAY | RESPIRATORY_TRACT | Status: AC | PRN

## 2014-02-20 MED ORDER — GABAPENTIN 300 MG PO CAPS: 300.0000 mg | ORAL_CAPSULE | Freq: Three times a day (TID) | ORAL | Status: DC

## 2014-02-20 MED ORDER — SORBITOL 70 % PO SOLN: 15.0000 mL | Freq: Every day | ORAL | Status: DC | PRN

## 2014-02-20 MED ORDER — METHENAMINE HIPPURATE 1 G PO TABS: 1.0000 g | ORAL_TABLET | Freq: Every day | ORAL | Status: AC | PRN

## 2014-02-20 MED ORDER — SORBITOL 70 % PO SOLN: 15.0000 mL | Freq: Every day | ORAL | Status: AC | PRN

## 2014-02-20 MED ORDER — SIMVASTATIN 20 MG PO TABS: ORAL_TABLET | ORAL | Status: DC

## 2014-02-20 MED ORDER — KETOCONAZOLE 2 % EX SHAM: 1.0000 "application " | MEDICATED_SHAMPOO | CUTANEOUS | Status: DC

## 2014-02-20 MED ORDER — BUPROPION HCL ER (SR) 150 MG PO TB12: 150.0000 mg | ORAL_TABLET | Freq: Three times a day (TID) | ORAL | Status: DC

## 2014-02-20 MED ORDER — METHENAMINE HIPPURATE 1 G PO TABS: 1.0000 g | ORAL_TABLET | Freq: Every day | ORAL | Status: DC | PRN

## 2014-02-20 NOTE — Telephone Encounter (Signed)
Per Dr Nyoka Cowden okay to fill for 90 days

## 2014-02-26 ENCOUNTER — Encounter (HOSPITAL_COMMUNITY): Payer: Self-pay | Admitting: Emergency Medicine

## 2014-02-26 ENCOUNTER — Inpatient Hospital Stay (HOSPITAL_COMMUNITY)
Admission: EM | Admit: 2014-02-26 | Discharge: 2014-03-11 | DRG: 389 | Disposition: A | Discharge status: Home / Self Care | Payer: Medicare Other | Attending: Internal Medicine | Admitting: Internal Medicine

## 2014-02-26 ENCOUNTER — Emergency Department (HOSPITAL_COMMUNITY): Payer: Medicare Other

## 2014-02-26 DIAGNOSIS — Z906 Acquired absence of other parts of urinary tract: Secondary | ICD-10-CM

## 2014-02-26 DIAGNOSIS — Z9079 Acquired absence of other genital organ(s): Secondary | ICD-10-CM

## 2014-02-26 DIAGNOSIS — E876 Hypokalemia: Secondary | ICD-10-CM | POA: Diagnosis not present

## 2014-02-26 DIAGNOSIS — N135 Crossing vessel and stricture of ureter without hydronephrosis: Secondary | ICD-10-CM | POA: Diagnosis present

## 2014-02-26 DIAGNOSIS — D649 Anemia, unspecified: Secondary | ICD-10-CM

## 2014-02-26 DIAGNOSIS — R141 Gas pain: Secondary | ICD-10-CM | POA: Diagnosis not present

## 2014-02-26 DIAGNOSIS — N390 Urinary tract infection, site not specified: Secondary | ICD-10-CM

## 2014-02-26 DIAGNOSIS — J4489 Other specified chronic obstructive pulmonary disease: Secondary | ICD-10-CM | POA: Diagnosis present

## 2014-02-26 DIAGNOSIS — R112 Nausea with vomiting, unspecified: Secondary | ICD-10-CM | POA: Diagnosis present

## 2014-02-26 DIAGNOSIS — L408 Other psoriasis: Secondary | ICD-10-CM | POA: Diagnosis present

## 2014-02-26 DIAGNOSIS — R142 Eructation: Secondary | ICD-10-CM | POA: Diagnosis not present

## 2014-02-26 DIAGNOSIS — R143 Flatulence: Secondary | ICD-10-CM

## 2014-02-26 DIAGNOSIS — Z936 Other artificial openings of urinary tract status: Secondary | ICD-10-CM

## 2014-02-26 DIAGNOSIS — N1330 Unspecified hydronephrosis: Secondary | ICD-10-CM | POA: Diagnosis present

## 2014-02-26 DIAGNOSIS — A498 Other bacterial infections of unspecified site: Secondary | ICD-10-CM | POA: Diagnosis present

## 2014-02-26 DIAGNOSIS — J449 Chronic obstructive pulmonary disease, unspecified: Secondary | ICD-10-CM | POA: Diagnosis present

## 2014-02-26 DIAGNOSIS — IMO0002 Reserved for concepts with insufficient information to code with codable children: Secondary | ICD-10-CM

## 2014-02-26 DIAGNOSIS — K56609 Unspecified intestinal obstruction, unspecified as to partial versus complete obstruction: Secondary | ICD-10-CM

## 2014-02-26 DIAGNOSIS — Z7982 Long term (current) use of aspirin: Secondary | ICD-10-CM

## 2014-02-26 DIAGNOSIS — I714 Abdominal aortic aneurysm, without rupture, unspecified: Secondary | ICD-10-CM | POA: Diagnosis present

## 2014-02-26 DIAGNOSIS — R1084 Generalized abdominal pain: Secondary | ICD-10-CM | POA: Diagnosis present

## 2014-02-26 DIAGNOSIS — I2510 Atherosclerotic heart disease of native coronary artery without angina pectoris: Secondary | ICD-10-CM | POA: Diagnosis present

## 2014-02-26 DIAGNOSIS — E785 Hyperlipidemia, unspecified: Secondary | ICD-10-CM | POA: Diagnosis present

## 2014-02-26 DIAGNOSIS — R7989 Other specified abnormal findings of blood chemistry: Secondary | ICD-10-CM | POA: Diagnosis present

## 2014-02-26 DIAGNOSIS — Z8546 Personal history of malignant neoplasm of prostate: Secondary | ICD-10-CM

## 2014-02-26 DIAGNOSIS — I252 Old myocardial infarction: Secondary | ICD-10-CM

## 2014-02-26 DIAGNOSIS — N183 Chronic kidney disease, stage 3 unspecified: Secondary | ICD-10-CM

## 2014-02-26 DIAGNOSIS — Z87891 Personal history of nicotine dependence: Secondary | ICD-10-CM

## 2014-02-26 DIAGNOSIS — Z66 Do not resuscitate: Secondary | ICD-10-CM | POA: Diagnosis present

## 2014-02-26 LAB — CBC WITH DIFFERENTIAL/PLATELET
Basophils Absolute: 0 10*3/uL (ref 0.0–0.1)
Basophils Relative: 0 % (ref 0–1)
Eosinophils Absolute: 0 10*3/uL (ref 0.0–0.7)
Eosinophils Relative: 1 % (ref 0–5)
HCT: 40.9 % (ref 39.0–52.0)
Hemoglobin: 14.2 g/dL (ref 13.0–17.0)
Lymphocytes Relative: 22 % (ref 12–46)
Lymphs Abs: 1.3 10*3/uL (ref 0.7–4.0)
MCH: 32.3 pg (ref 26.0–34.0)
MCHC: 34.7 g/dL (ref 30.0–36.0)
MCV: 93 fL (ref 78.0–100.0)
Monocytes Absolute: 0.8 10*3/uL (ref 0.1–1.0)
Monocytes Relative: 14 % — ABNORMAL HIGH (ref 3–12)
Neutro Abs: 3.6 10*3/uL (ref 1.7–7.7)
Neutrophils Relative %: 63 % (ref 43–77)
Platelets: 213 10*3/uL (ref 150–400)
RBC: 4.4 MIL/uL (ref 4.22–5.81)
RDW: 13.9 % (ref 11.5–15.5)
WBC: 5.7 10*3/uL (ref 4.0–10.5)

## 2014-02-26 LAB — COMPREHENSIVE METABOLIC PANEL
ALT: 68 U/L — ABNORMAL HIGH (ref 0–53)
AST: 70 U/L — ABNORMAL HIGH (ref 0–37)
Albumin: 3.4 g/dL — ABNORMAL LOW (ref 3.5–5.2)
Alkaline Phosphatase: 58 U/L (ref 39–117)
BUN: 31 mg/dL — ABNORMAL HIGH (ref 6–23)
CO2: 24 mEq/L (ref 19–32)
Calcium: 9.6 mg/dL (ref 8.4–10.5)
Chloride: 99 mEq/L (ref 96–112)
Creatinine, Ser: 1.4 mg/dL — ABNORMAL HIGH (ref 0.50–1.35)
GFR calc Af Amer: 53 mL/min — ABNORMAL LOW (ref >90)
GFR calc non Af Amer: 46 mL/min — ABNORMAL LOW (ref >90)
Glucose, Bld: 116 mg/dL — ABNORMAL HIGH (ref 70–99)
Potassium: 4.8 mEq/L (ref 3.7–5.3)
Sodium: 135 mEq/L — ABNORMAL LOW (ref 137–147)
Total Bilirubin: 0.7 mg/dL (ref 0.3–1.2)
Total Protein: 8.3 g/dL (ref 6.0–8.3)

## 2014-02-26 LAB — URINE MICROSCOPIC-ADD ON

## 2014-02-26 LAB — URINALYSIS, ROUTINE W REFLEX MICROSCOPIC
BILIRUBIN URINE: NEGATIVE
GLUCOSE, UA: NEGATIVE mg/dL
KETONES UR: NEGATIVE mg/dL
NITRITE: POSITIVE — AB
Protein, ur: 100 mg/dL — AB
Specific Gravity, Urine: 1.024 (ref 1.005–1.030)
Urobilinogen, UA: 0.2 mg/dL (ref 0.0–1.0)
pH: 6 (ref 5.0–8.0)

## 2014-02-26 LAB — LIPASE, BLOOD: Lipase: 25 U/L (ref 11–59)

## 2014-02-26 MED ORDER — SODIUM CHLORIDE 0.9 % IV SOLN
INTRAVENOUS | Status: DC
  Administered 2014-02-26 (×2): via INTRAVENOUS
  Administered 2014-02-27: 75 mL/h via INTRAVENOUS
  Administered 2014-02-28: 75 mL via INTRAVENOUS
  Administered 2014-02-28: 04:00:00 via INTRAVENOUS

## 2014-02-26 MED ORDER — ONDANSETRON HCL 4 MG PO TABS
4.0000 mg | ORAL_TABLET | Freq: Four times a day (QID) | ORAL | Status: DC | PRN
  Filled 2014-02-26: qty 1

## 2014-02-26 MED ORDER — IOHEXOL 300 MG/ML  SOLN
50.0000 mL | Freq: Once | INTRAMUSCULAR | Status: AC | PRN
  Administered 2014-02-26: 50 mL via ORAL

## 2014-02-26 MED ORDER — MORPHINE SULFATE 2 MG/ML IJ SOLN
1.0000 mg | INTRAMUSCULAR | Status: DC | PRN
  Administered 2014-02-26 – 2014-03-08 (×17): 1 mg via INTRAVENOUS
  Filled 2014-02-26 (×17): qty 1

## 2014-02-26 MED ORDER — ONDANSETRON HCL 4 MG/2ML IJ SOLN
4.0000 mg | Freq: Four times a day (QID) | INTRAMUSCULAR | Status: DC | PRN
  Administered 2014-02-26 – 2014-03-11 (×9): 4 mg via INTRAVENOUS
  Filled 2014-02-26 (×8): qty 2

## 2014-02-26 MED ORDER — ENOXAPARIN SODIUM 40 MG/0.4ML ~~LOC~~ SOLN
40.0000 mg | SUBCUTANEOUS | Status: DC
  Administered 2014-02-26 – 2014-03-10 (×10): 40 mg via SUBCUTANEOUS
  Filled 2014-02-26 (×14): qty 0.4

## 2014-02-26 MED ORDER — MORPHINE SULFATE 4 MG/ML IJ SOLN
4.0000 mg | Freq: Once | INTRAMUSCULAR | Status: AC
  Administered 2014-02-26: 4 mg via INTRAVENOUS
  Filled 2014-02-26: qty 1

## 2014-02-26 MED ORDER — DEXTROSE 5 % IV SOLN
1.0000 g | INTRAVENOUS | Status: DC
  Administered 2014-02-27 – 2014-03-02 (×4): 1 g via INTRAVENOUS
  Filled 2014-02-26 (×5): qty 10

## 2014-02-26 MED ORDER — IOHEXOL 300 MG/ML  SOLN
100.0000 mL | Freq: Once | INTRAMUSCULAR | Status: AC | PRN
  Administered 2014-02-26: 100 mL via INTRAVENOUS

## 2014-02-26 MED ORDER — ONDANSETRON HCL 4 MG/2ML IJ SOLN
4.0000 mg | Freq: Once | INTRAMUSCULAR | Status: AC
  Administered 2014-02-26: 4 mg via INTRAVENOUS
  Filled 2014-02-26: qty 2

## 2014-02-26 MED ORDER — HYDROMORPHONE HCL PF 1 MG/ML IJ SOLN: 0.5000 mg | INTRAMUSCULAR | Status: DC | PRN

## 2014-02-26 MED ORDER — ONDANSETRON HCL 4 MG/2ML IJ SOLN: 4.0000 mg | Freq: Three times a day (TID) | INTRAMUSCULAR | Status: DC | PRN

## 2014-02-26 MED ORDER — DEXTROSE 5 % IV SOLN
1.0000 g | Freq: Once | INTRAVENOUS | Status: AC
  Administered 2014-02-26: 1 g via INTRAVENOUS
  Filled 2014-02-26: qty 10

## 2014-02-26 MED ORDER — TIOTROPIUM BROMIDE MONOHYDRATE 18 MCG IN CAPS
18.0000 ug | ORAL_CAPSULE | Freq: Every day | RESPIRATORY_TRACT | Status: DC
  Administered 2014-02-27 – 2014-03-11 (×13): 18 ug via RESPIRATORY_TRACT
  Filled 2014-02-26 (×4): qty 5

## 2014-02-26 MED ORDER — DEXTROSE 5 % IV SOLN
1.0000 g | INTRAVENOUS | Status: DC
  Filled 2014-02-26: qty 10

## 2014-02-26 NOTE — Consult Note (Addendum)
Re:   Michael Novak DOB:   12/30/1929 MRN:   [id number]  [facility] Consultation  ASSESSMENT AND PLAN: 1.  SBO, recurrent  CT evidence of source of obstruction in pelvis.  Also has significant parastomal hernia.  Agree with NPO, IVF, repeat PE, and KUB in AM.  2.  Ureteral stricture, chronic nephroureteral stenting  Has been followed by Dr. Mattie Marlin at [facility].  He is getting a stent replaced every 6 weeks.  3.  Parastomal hernia at left colostomy.  This has been repaired at least once at [facility].  This is a large parastomal hernia, which is not tender. 4.  COPD 5.  Hypercholesterolemia 6.  History of prostate cancer  Had seed implant around 6433, but then complications from the seeds. 7.  Rectouretheral fistula  S/p cystectomy, ileal conduit, ostomy - 2002 - [place] 8.  History of coronary artery disease 9.  5.0 cm AAA 10.  Mildly elevated creatinine - 02/27/2104 - 1.4  11.  History of psoriasis.  Chief Complaint  Patient presents with  . Abdominal Pain   REFERRING PHYSICIAN:  Quincy Carnes, PA, [facility]  HISTORY OF PRESENT ILLNESS: Michael Novak is a 80 y.o. (DOB: [birth year])  white  male whose primary care physician is GREEN, Michael Spare, MD and comes to the [facility] ER with abdominal pain, nausea, and vomiting. He is by himself in the room.  He has a complicated abdominal history.  It seems like he had a urethral/rectal injury around 2001 secondary to prostate seed radiation. On 07/06/2001, he had a 1. pelvic exenteration with ileal conduit diversion and colostomy, 2. Cystoprostatectomy and ileal conduit diversion - Dr. Amalia Hailey. 3. Anterior peritoneal resection of sigmoid with colostomy - Dr. Rise Patience. 4. Incidental appendectomy.  He had a SBO requiring enterolysis by Dr. Rise Patience - 12/07/2009. Around 2012 (not sure of the exact date), he has a history of parastomal hernia repaired laparoscopically at [facility] (Dr. Alvan Dame). He was in the hospital in 05/2012 and 02/2013 with a small  bowel obstruction.  He has been seen by multiple partners at [facility] in the past.    He has a known recurrent peristomal hernia and has seen Dr. Johney Maine electively for consideration for repair was not felt to be an adequate surgical candidate.  He has not had a BM since Saturday, 02/23/2014.  He has developed cramping abdominal pain.  He thought about coming to the hospital last PM, but then came in today.  His CT scan of his abdomen/pelvis - 02/26/2014 - Recurrent high-grade small bowel obstruction without evidence of bowel perforation. Transition point in small bowel appears to be deep in the inferior pelvis at the level of some irregular appearing small bowel loops near brachytherapy seeds as well as a right-sided  perirectal hernia. Bowel in this region may be relatively strictured.   Past Medical History  Diagnosis Date  . History of colostomy   . Urostomy stenosis     Ask patient to clarify, per history form dated 04/02/11.  . Prostate cancer   . Skin cancer   . Neuropathy     in feet   . COPD (chronic obstructive pulmonary disease)   . Depression   . UTI (lower urinary tract infection)     history   . AAA (abdominal aortic aneurysm)   . 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 Dyslipidemia 12/14/2011  . Bradycardia, HR 30-40s, Nov-Dec2012 11/26/2011  . Rectourethral fistula     s/p cystectomy / ileal conduit /  ostomy  . Loss of weight 08/22/2012  . Seborrhea capitis 02/29/2012  . Unspecified erythematous condition 02/29/2012  . Thrombocytopenia, unspecified 12/22/2011  . Acute kidney failure, unspecified 12/22/2011  . Pyelonephritis, unspecified 12/22/2011  . Inguinal hernia without mention of obstruction or gangrene, unilateral or unspecified, (not specified as recurrent) 12/01/2011  . Chest pain, unspecified 12/01/2011  . Acute upper respiratory infections of unspecified site 10/02/2009  . Sebaceous cyst 01/22/2009  . Carbuncle and furuncle of buttock 01/08/2009  .  Ventral hernia, unspecified, without mention of obstruction or gangrene 12/04/2008  . Incisional hernia without mention of obstruction or gangrene 03/20/2008  . Gout, unspecified 02/15/2008  . Other malaise and fatigue 10.24/2008  . Senile cataract, unspecified 02/18/2006  . Pain in joint, multiple sites 08/30/2005  . Diverticulosis of colon (without mention of hemorrhage) 02/11/2005  . Lumbago 09/25/2003  . Edema 09/25/2003  . 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 Unspecified hereditary and idiopathic peripheral neuropathy 08/10/2000  . Coronary atherosclerosis of unspecified type of vessel, native or graft 08/10/1996  . Acute myocardial infarction, unspecified site, episode of care unspecified 11/30/1995  . Lyme disease 11/30/1987  . Psoriasis 12/26/13    [facility] hospital.      Past Surgical History  Procedure Laterality Date  . Cystectomy, ileal coduit, colostomy  2002    for severe rectourethral fistula  . Ex lap, lysis of adhesions  2010    for SBO  . Lap repair of parastomal hernias  2009    Dr. Alvan Dame [facility],  Hematoma evac POD#1  . Eye surgery      cataracts removed  . Cardiac catheterization  07/04/2001  . Colostomy  08/03/2001    Rise Patience, MD  . Lucianne Lei  Garfield Cornea, MD  . Hernia repair  1970    x 4  . Clavicle surgery  1952    Separation      Current Facility-Administered Medications  Medication Dose Route Frequency Provider Last Rate Last Dose  . HYDROmorphone (DILAUDID) injection 0.5 mg  0.5 mg Intravenous Q4H PRN Larene Pickett, PA-C      . ondansetron [facility]) injection 4 mg  4 mg Intravenous Q8H PRN Larene Pickett, PA-C       Current Outpatient Prescriptions  Medication Sig Dispense Refill  . acetaminophen (TYLENOL) 500 MG tablet Take 1,000 mg by mouth every 6 (six) hours as needed. For pain      . albuterol (PROVENTIL HFA;VENTOLIN HFA) 108 (90 BASE) MCG/ACT inhaler Inhale 2 puffs into the lungs every 4 (four) hours as needed for  shortness of breath.  3 Inhaler  1  . aspirin EC 81 MG tablet Take 81 mg by mouth at bedtime.       Marland Kitchen buPROPion (WELLBUTRIN SR) 150 MG 12 hr tablet Take 1 tablet (150 mg total) by mouth 3 (three) times daily.  90 tablet  0  . Calcium-Magnesium-Vitamin D (CALCIUM MAGNESIUM PO) Take 1 tablet by mouth every morning.       . gabapentin (NEURONTIN) 100 MG capsule Take 200 mg by mouth at bedtime. In addition to 300mg , per his dermatologist      . gabapentin (NEURONTIN) 300 MG capsule Take 300 mg by mouth 3 (three) times daily.      Marland Kitchen glucosamine-chondroitin 500-400 MG tablet Take 1 tablet by mouth 2 (two) times daily.       Marland Kitchen ketoconazole (NIZORAL) 2 % shampoo Apply 1 application topically 2 (two) times a week.  120 mL  2  . Multiple Vitamin (MULTIVITAMIN WITH MINERALS) TABS Take 1 tablet by mouth every morning.       . nitrofurantoin (MACRODANTIN) 50 MG capsule Take 1 capsule (50 mg total) by mouth at bedtime.  90 capsule  0  . omega-3 acid ethyl esters (LOVAZA) 1 G capsule Take 1 g by mouth 2 (two) times daily.      Marland Kitchen selenium sulfide (SELSUN) 2.5 % shampoo Apply 1 application topically daily as needed (Applies to scalp for dandruff or itching.).       Marland Kitchen simvastatin (ZOCOR) 20 MG tablet Take 20 mg by mouth at bedtime. One nightly to control cholesterol      . sodium chloride (OCEAN) 0.65 % nasal spray Place 1 spray into the nose daily as needed. For nasal decongestant      . sorbitol 70 % solution Take 15 mLs by mouth daily as needed. constipation  473 mL  2  . tiotropium (SPIRIVA) 18 MCG inhalation capsule Place 1 capsule (18 mcg total) into inhaler and inhale daily.  90 capsule  0  . triamcinolone cream (KENALOG) 0.1 % Apply 1 application topically as needed (to scalp for dandruff).      . vitamin C (ASCORBIC ACID) 500 MG tablet Take 500 mg by mouth daily.      . methenamine (HIPREX) 1 G tablet Take 1 tablet (1 g total) by mouth daily as needed. Taken if patient has kidney infection. Alternating with  Cipro.  90 tablet  0      Allergies  Allergen Reactions  . Augmentin [Amoxicillin-Pot Clavulanate] Other (See Comments)    Reaction unknown.    REVIEW OF SYSTEMS: Skin:  No history of rash.  No history of abnormal moles. Infection:  No history of hepatitis or HIV.  No history of MRSA. Neurologic:  No history of stroke.  No history of seizure.  No history of headaches. Cardiac:  CAD.  Has seen Dr. Linard Millers in the past (03/25/2013) Pulmonary:  History of COPD.  Endocrine:  No diabetes. No thyroid disease. Gastrointestinal: See HPI. Urologic:  See HPI Musculoskeletal:  No history of joint or back disease. Hematologic:  No bleeding disorder.  No history of anemia.  Not anticoagulated. Psycho-social:  The patient is oriented.     SOCIAL and FAMILY HISTORY: Married.  Wife is Pamala Hurry.  She has already gone home. He lives independently. He is also followed at the [facility] - Dr. Duke Salvia is his PCP.  PHYSICAL EXAM: BP 108/67  Pulse 66  Temp(Src) 98.2 F (36.8 C) (Oral)  Resp 19  SpO2 93%  General: WN older WM who is alert and generally healthy appearing.  HEENT: Normal. Pupils equal. Neck: Supple. No mass.  No thyroid mass. Lymph Nodes:  No supraclavicular or cervical nodes. Lungs: Clear to auscultation and symmetric breath sounds. Heart:  RRR. No murmur or rub. Abdomen: Soft. Well healed midline scar.  LLQ ostomy with parastomal hernia.  RLQ ileostomy with ureteral stent in place. Rectal: Not done. Extremities:  Good strength and ROM  in upper and lower extremities. Neurologic:  Grossly intact to motor and sensory function. Psychiatric: Has normal mood and affect. Behavior is normal.   DATA REVIEWED: Epic notes. WBC - 02/26/2014 - 5,700  Alphonsa Overall, MD,  [facility] [address]  [place], [place]    [place] Phone:  [phone] FAX:  [phone]

## 2014-02-26 NOTE — H&P (Signed)
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 He also has an abdominal aortic aneurysm followed by vascular surgery as an outpatient, COPD. He states that for the past 3 days she has had no colostomy output even though he usually will change his bag 2 or 3 times a day. He also noticed yesterday after eating dinner that he developed abdominal pain followed by nausea and vomiting x2 episodes of bilious, nonbloody fluid. He decided to come to the hospital today for evaluation where a CT scan of the abdomen and pelvis shows a recurrent high-grade small bowel obstruction without evidence of bowel perforation. Transition point in small bowel appears to be deep in the inferior pelvis at the level of some irregular appearing small bowel loops near brachial therapy seeds as well as a right sided peri-rectal hernia. Surgery consultation has been requested. Hospital is admission is requested.  Allergies:   Allergies  Allergen Reactions  . Augmentin [Amoxicillin-Pot Clavulanate] Other (See Comments)    Reaction unknown.      Past Medical History  Diagnosis Date  . History of colostomy   . Urostomy stenosis     Ask patient to clarify, per history form dated 04/02/11.  . Prostate cancer   . Skin cancer   . Neuropathy     in feet   . COPD (chronic obstructive pulmonary disease)   . Depression   . UTI (lower urinary tract infection)     history   . AAA (abdominal aortic aneurysm)   . Parastomal hernia at left  colostomy 11/26/2011  . Dyslipidemia 12/14/2011  . Bradycardia, HR 30-40s, Nov-Dec2012 11/26/2011  . 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 Loss of weight 08/22/2012  . Seborrhea capitis 02/29/2012  . Unspecified erythematous condition 02/29/2012  . Thrombocytopenia, unspecified 12/22/2011  . Acute kidney failure, unspecified 12/22/2011  . Pyelonephritis, unspecified 12/22/2011  . Inguinal hernia without mention of obstruction or gangrene, unilateral or unspecified, (not specified as recurrent) 12/01/2011  . Chest pain, unspecified 12/01/2011  . Acute upper respiratory infections of unspecified site 10/02/2009  . Sebaceous cyst 01/22/2009  . Carbuncle and furuncle of buttock 01/08/2009  . Ventral hernia, unspecified, without mention of obstruction or gangrene 12/04/2008  . Incisional hernia without mention of obstruction or gangrene 03/20/2008  . Gout, unspecified 02/15/2008  . Other malaise and fatigue 10.24/2008  . Senile cataract, unspecified 02/18/2006  . Pain in joint, multiple sites 08/30/2005  . Diverticulosis of colon (without mention of hemorrhage) 02/11/2005  . Lumbago 09/25/2003  . Edema 09/25/2003  . 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 Unspecified hereditary and idiopathic peripheral neuropathy 08/10/2000  . Coronary atherosclerosis of unspecified type of vessel, native or graft 08/10/1996  . Acute myocardial infarction, unspecified site, episode of care unspecified 11/30/1995  . Lyme disease 11/30/1987  . Psoriasis 12/26/13    [facility] hospital.    Past Surgical History  Procedure Laterality Date  .  Cystectomy, ileal coduit, colostomy  2002    for severe rectourethral fistula  . Ex lap, lysis of adhesions  2010    for SBO  . Lap repair of parastomal hernias  2009    Dr. Alvan Dame [facility],  Hematoma evac POD#1  . Eye surgery      cataracts removed  . Cardiac catheterization  07/04/2001  . Colostomy  08/03/2001    Rise Patience,  MD  . Lucianne Lei  Garfield Cornea, MD  . Hernia repair  1970    x 4  . Clavicle surgery  1952    Separation    Prior to Admission medications   Medication Sig Start Date End Date Taking? Authorizing Provider  acetaminophen (TYLENOL) 500 MG tablet Take 1,000 mg by mouth every 6 (six) hours as needed. For pain   Yes Historical Provider, MD  albuterol (PROVENTIL HFA;VENTOLIN HFA) 108 (90 BASE) MCG/ACT inhaler Inhale 2 puffs into the lungs every 4 (four) hours as needed for shortness of breath. 02/20/14  Yes Estill Dooms, MD  aspirin EC 81 MG tablet Take 81 mg by mouth at bedtime.    Yes Historical Provider, MD  buPROPion (WELLBUTRIN SR) 150 MG 12 hr tablet Take 1 tablet (150 mg total) by mouth 3 (three) times daily. 02/20/14  Yes Estill Dooms, MD  Calcium-Magnesium-Vitamin D (CALCIUM MAGNESIUM PO) Take 1 tablet by mouth every morning.    Yes Historical Provider, MD  gabapentin (NEURONTIN) 100 MG capsule Take 200 mg by mouth at bedtime. In addition to $RemoveBef'300mg'[id number]$ , per his dermatologist   Yes Historical Provider, MD  gabapentin (NEURONTIN) 300 MG capsule Take 300 mg by mouth 3 (three) times daily.   Yes Historical Provider, MD  glucosamine-chondroitin 500-400 MG tablet Take 1 tablet by mouth 2 (two) times daily.    Yes Historical Provider, MD  ketoconazole (NIZORAL) 2 % shampoo Apply 1 application topically 2 (two) times a week. 02/20/14  Yes Estill Dooms, MD  Multiple Vitamin (MULTIVITAMIN WITH MINERALS) TABS Take 1 tablet by mouth every morning.    Yes Historical Provider, MD  nitrofurantoin (MACRODANTIN) 50 MG capsule Take 1 capsule (50 mg total) by mouth at bedtime. 02/20/14  Yes Estill Dooms, MD  omega-3 acid ethyl esters (LOVAZA) 1 G capsule Take 1 g by mouth 2 (two) times daily.   Yes Historical Provider, MD  selenium sulfide (SELSUN) 2.5 % shampoo Apply 1 application topically daily as needed (Applies to scalp for dandruff or itching.).  10/19/12  Yes Historical Provider, MD    simvastatin (ZOCOR) 20 MG tablet Take 20 mg by mouth at bedtime. One nightly to control cholesterol 02/20/14  Yes Estill Dooms, MD  sodium chloride (OCEAN) 0.65 % nasal spray Place 1 spray into the nose daily as needed. For nasal decongestant   Yes Historical Provider, MD  sorbitol 70 % solution Take 15 mLs by mouth daily as needed. constipation 02/20/14  Yes Estill Dooms, MD  tiotropium (SPIRIVA) 18 MCG inhalation capsule Place 1 capsule (18 mcg total) into inhaler and inhale daily. 02/20/14  Yes Estill Dooms, MD  triamcinolone cream (KENALOG) 0.1 % Apply 1 application topically as needed (to scalp for dandruff).   Yes Historical Provider, MD  vitamin C (ASCORBIC ACID) 500 MG tablet Take 500 mg by mouth daily.   Yes Historical Provider, MD  methenamine (HIPREX) 1 G tablet Take 1 tablet (1 g total) by mouth daily as needed. Taken if patient has kidney infection. Alternating  with Cipro. 02/20/14   Estill Dooms, MD    Social History:  reports that he has quit smoking. His smoking use included Cigarettes. He smoked 0.00 packs per day for 37 years. He has never used smokeless tobacco. He reports that he drinks about 0.6 ounces of alcohol per week. He reports that he does not use illicit drugs.  Family History  Problem Relation Age of Onset  . Heart disease Sister   . Heart disease Brother   . Cancer Brother   . Heart disease Sister   . Kidney disease Sister   . Cancer Sister     Review of Systems:  Constitutional: Denies fever, chills, diaphoresis, appetite change and fatigue.  HEENT: Denies photophobia, eye pain, redness, hearing loss, ear pain, congestion, sore throat, rhinorrhea, sneezing, mouth sores, trouble swallowing, neck pain, neck stiffness and tinnitus.   Respiratory: Denies SOB, DOE, cough, chest tightness,  and wheezing.   Cardiovascular: Denies chest pain, palpitations and leg swelling.  Gastrointestinal: Denies diarrhea, constipation, blood in stool and abdominal  distention.  Genitourinary: Denies dysuria, urgency, frequency, hematuria, flank pain and difficulty urinating.  Endocrine: Denies: hot or cold intolerance, sweats, changes in hair or nails, polyuria, polydipsia. Musculoskeletal: Denies myalgias, back pain, joint swelling, arthralgias and gait problem.  Skin: Denies pallor, rash and wound.  Neurological: Denies dizziness, seizures, syncope, weakness, light-headedness, numbness and headaches.  Hematological: Denies adenopathy. Easy bruising, personal or family bleeding history  Psychiatric/Behavioral: Denies suicidal ideation, mood changes, confusion, nervousness, sleep disturbance and agitation   Physical Exam: Blood pressure 108/67, pulse 66, temperature 98.2 F (36.8 C), temperature source Oral, resp. rate 19, SpO2 93.00%. General: Alert, awake, oriented x3, in no current distress. HEENT: Normocephalic, atraumatic, pupils equal and reactive to light, extraocular movements intact, moist mucous membranes. Neck: Supple, no JVD, no lymphadenopathy, no bruits, no goiter. Cardiovascular: Regular rate and rhythm, no murmurs, rubs or gallops. Lungs: Clear to auscultation bilaterally. Abdomen: Soft, nondistended, hypoactive bowel sounds, colostomy and ileostomy bags are present, large hernia surrounding colostomy. Extremities: No clubbing, cyanosis or edema, positive pedal pulses. Neurologic: Grossly intact and nonfocal. I have not ambulated him.  Labs on Admission:  Results for orders placed during the hospital encounter of 02/26/14 (from the past 48 hour(s))  CBC WITH DIFFERENTIAL     Status: Abnormal   Collection Time    02/26/14  2:28 PM      Result Value Ref Range   WBC 5.7  4.0 - 10.5 K/uL   RBC 4.40  4.22 - 5.81 MIL/uL   Hemoglobin 14.2  13.0 - 17.0 g/dL   HCT 40.9  39.0 - 52.0 %   MCV 93.0  78.0 - 100.0 fL   MCH 32.3  26.0 - 34.0 pg   MCHC 34.7  30.0 - 36.0 g/dL   RDW 13.9  11.5 - 15.5 %   Platelets 213  150 - 400 K/uL    Neutrophils Relative % 63  43 - 77 %   Neutro Abs 3.6  1.7 - 7.7 K/uL   Lymphocytes Relative 22  12 - 46 %   Lymphs Abs 1.3  0.7 - 4.0 K/uL   Monocytes Relative 14 (*) 3 - 12 %   Monocytes Absolute 0.8  0.1 - 1.0 K/uL   Eosinophils Relative 1  0 - 5 %   Eosinophils Absolute 0.0  0.0 - 0.7 K/uL   Basophils Relative 0  0 - 1 %   Basophils Absolute 0.0  0.0 - 0.1 K/uL  COMPREHENSIVE METABOLIC PANEL     Status: Abnormal   Collection Time    02/26/14  2:28 PM      Result Value Ref Range   Sodium 135 (*) 137 - 147 mEq/L   Potassium 4.8  3.7 - 5.3 mEq/L   Chloride 99  96 - 112 mEq/L   CO2 24  19 - 32 mEq/L   Glucose, Bld 116 (*) 70 - 99 mg/dL   BUN 31 (*) 6 - 23 mg/dL   Creatinine, Ser 1.40 (*) 0.50 - 1.35 mg/dL   Calcium 9.6  8.4 - 10.5 mg/dL   Total Protein 8.3  6.0 - 8.3 g/dL   Albumin 3.4 (*) 3.5 - 5.2 g/dL   AST 70 (*) 0 - 37 U/L   ALT 68 (*) 0 - 53 U/L   Alkaline Phosphatase 58  39 - 117 U/L   Total Bilirubin 0.7  0.3 - 1.2 mg/dL   GFR calc non Af Amer 46 (*) >90 mL/min   GFR calc Af Amer 53 (*) >90 mL/min   Comment: (NOTE)     The eGFR has been calculated using the CKD EPI equation.     This calculation has not been validated in all clinical situations.     eGFR's persistently <90 mL/min signify possible Chronic Kidney     Disease.  LIPASE, BLOOD     Status: None   Collection Time    02/26/14  2:28 PM      Result Value Ref Range   Lipase 25  11 - 59 U/L  URINALYSIS, ROUTINE W REFLEX MICROSCOPIC     Status: Abnormal   Collection Time    02/26/14  2:36 PM      Result Value Ref Range   Color, Urine AMBER (*) YELLOW   Comment: BIOCHEMICALS MAY BE AFFECTED BY COLOR   APPearance TURBID (*) CLEAR   Specific Gravity, Urine 1.024  1.005 - 1.030   pH 6.0  5.0 - 8.0   Glucose, UA NEGATIVE  NEGATIVE mg/dL   Hgb urine dipstick SMALL (*) NEGATIVE   Bilirubin Urine NEGATIVE  NEGATIVE   Ketones, ur NEGATIVE  NEGATIVE mg/dL   Protein, ur 100 (*) NEGATIVE mg/dL   Urobilinogen, UA  0.2  0.0 - 1.0 mg/dL   Nitrite POSITIVE (*) NEGATIVE   Leukocytes, UA LARGE (*) NEGATIVE  URINE MICROSCOPIC-ADD ON     Status: Abnormal   Collection Time    02/26/14  2:36 PM      Result Value Ref Range   Squamous Epithelial / LPF FEW (*) RARE   WBC, UA TOO NUMEROUS TO COUNT  <3 WBC/hpf   Comment: WBC CLUMPS   RBC / HPF 11-20  <3 RBC/hpf   Bacteria, UA MANY (*) RARE   Urine-Other MUCOUS PRESENT     Comment: AMORPHOUS URATES/PHOSPHATES    Radiological Exams on Admission: Ct Abdomen Pelvis W Contrast  02/26/2014   CLINICAL DATA:  Abdominal pain, nausea and constipation. History of prostate carcinoma and prior cystectomy.  EXAM: CT ABDOMEN AND PELVIS WITH CONTRAST  TECHNIQUE: Multidetector CT imaging of the abdomen and pelvis was performed using the standard protocol following bolus administration of intravenous contrast.  CONTRAST:  187mL OMNIPAQUE IOHEXOL 300 MG/ML  SOLN  COMPARISON:  SP PERC TUBE CHANGE W/CM dated 02/07/2014; IR NEPHROSTOGRAM*R* dated 12/27/2013; IR PERC NEPHROSTOMY*R* dated 12/17/2013; CT ABD/PELVIS W CM dated 03/25/2013; CT ABD/PELVIS W CM dated 11/08/2012  FINDINGS: Recurrent small bowel obstruction is identified with multiple dilated small  bowel loops identified. Maximal caliber of small bowel in the central abdomen approaches 5 cm. As small bowel is followed inferiorly, there is enhancement of loops in the lower pelvis including a loop that extends into what appears to be a right-sided perirectal/perineal hernia extending into the perirectal fat. At this level, the patient also has brachytherapy seeds identified in the region of the prostate gland. It is suspected that there may be strictured small bowel in this region as well.  No evidence of free intraperitoneal air, fistula or focal abscess. There is a stable chronic left-sided parastomal ventral hernia containing colon. Right-sided nephroureteral catheter enters an ileostomy and ascends up the right ureter. There is no  evidence of hydronephrosis. The right kidney is chronically atrophic compared to the left.  There is a stable 5 cm infrarenal abdominal aortic aneurysm without evidence of rupture. The liver shows mild steatosis without focal mass. No biliary ductal dilatation is seen. The pancreas, spleen and adrenal glands are unremarkable. No soft tissue masses or enlarged lymph nodes are seen. Bony structures show chronic and stable compression fracture of the L1 vertebral body.  IMPRESSION: Recurrent high-grade small bowel obstruction without evidence of bowel perforation. Transition point in small bowel appears to be deep in the inferior pelvis at the level of some irregular appearing small bowel loops near brachytherapy seeds as well as a right-sided perirectal hernia. Bowel in this region may be relatively strictured.   Electronically Signed   By: Aletta Edouard M.D.   On: 02/26/2014 17:14    Assessment/Plan Principal Problem:   Small bowel obstruction Active Problems:   Hydronephrosis, right status post right nephrostomy tube placement   H/O colostomy   CKD (chronic kidney disease) stage 3, GFR 30-59 ml/min   SBO (small bowel obstruction)    Recurrent small bowel obstruction -CT category as this is a high-grade obstruction with a transition point. -Place NG tube, keep n.p.o. -Surgical consultation requested. Patient and wife hopeful for resolution without surgery.  History of hydronephrosis status post right nephrostomy tube placement -This is exchanged every 6 weeks, not due until mid-May.  Chronic kidney disease stage III -Creatinine at baseline of 1.4-1.6.  DVT prophylaxis -Lovenox  CODE STATUS -Full code as discussed with patient and wife Pamala Hurry at bedside.   Time Spent on Admission: 80 minutes  HERNANDEZ ACOSTA,ESTELA [facility] Pager: [phone] 02/26/2014, 7:02 PM

## 2014-02-26 NOTE — ED Notes (Signed)
[facility].Marland [email or web address]

## 2014-02-26 NOTE — ED Notes (Signed)
Attempted NG tube in left nare. Was meeting a lot of resistance while inserting the tube.  Pt has was in great amoutn of pain and has past history of trouble with NG tubes, so i stopped trying to force tube and pulled out. Eliot Ford PA aware.

## 2014-02-26 NOTE — ED Provider Notes (Signed)
CSN: [id number]     Arrival date & time 02/26/14  1402 History   First MD Initiated Contact with Patient 02/26/14 1500     Chief Complaint  Patient presents with  . Abdominal Pain     (Consider location/radiation/quality/duration/timing/severity/associated sxs/prior Treatment) The history is provided by the patient and medical records.   This is an 80 year old male with extensive past medical history significant for dyslipidemia, COPD, depression, prior MI without stenting, presenting to the ED for abdominal pain and nausea, onset last night. Patient states he is having severe cramping localized to his epigastric and periumbilical region. Cramping is constant without alleviating or exacerbating factors. He has associated nausea with one episode of nonbloody, nonbilious emesis yesterday. He's had no vomiting today. States he has not had any output of his colostomy since Sunday morning. He states usually he would change his colostomy bag 3 times daily.  Pt has had multiple abdominal surgeries including cystectomy with ileal conduit, colostomy, ileostomy, hernia repair, ex lap for SBO repair.  VS stable on arrival.  Past Medical History  Diagnosis Date  . History of colostomy   . Urostomy stenosis     Ask patient to clarify, per history form dated 04/02/11.  . Prostate cancer   . Skin cancer   . Neuropathy     in feet   . COPD (chronic obstructive pulmonary disease)   . Depression   . UTI (lower urinary tract infection)     history   . AAA (abdominal aortic aneurysm)   . 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 Dyslipidemia 12/14/2011  . Bradycardia, HR 30-40s, Nov-Dec2012 11/26/2011  . 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 Loss of weight 08/22/2012  . Seborrhea capitis 02/29/2012  . Unspecified erythematous condition 02/29/2012  . Thrombocytopenia, unspecified 12/22/2011  . Acute kidney failure, unspecified 12/22/2011  . Pyelonephritis,  unspecified 12/22/2011  . Inguinal hernia without mention of obstruction or gangrene, unilateral or unspecified, (not specified as recurrent) 12/01/2011  . Chest pain, unspecified 12/01/2011  . Acute upper respiratory infections of unspecified site 10/02/2009  . Sebaceous cyst 01/22/2009  . Carbuncle and furuncle of buttock 01/08/2009  . Ventral hernia, unspecified, without mention of obstruction or gangrene 12/04/2008  . Incisional hernia without mention of obstruction or gangrene 03/20/2008  . Gout, unspecified 02/15/2008  . Other malaise and fatigue 10.24/2008  . Senile cataract, unspecified 02/18/2006  . Pain in joint, multiple sites 08/30/2005  . Diverticulosis of colon (without mention of hemorrhage) 02/11/2005  . Lumbago 09/25/2003  . Edema 09/25/2003  . 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 Unspecified hereditary and idiopathic peripheral neuropathy 08/10/2000  . Coronary atherosclerosis of unspecified type of vessel, native or graft 08/10/1996  . Acute myocardial infarction, unspecified site, episode of care unspecified 11/30/1995  . Lyme disease 11/30/1987  . Psoriasis 12/26/13    [facility] hospital.   Past Surgical History  Procedure Laterality Date  . Cystectomy, ileal coduit, colostomy  2002    for severe rectourethral fistula  . Ex lap, lysis of adhesions  2010    for SBO  . Lap repair of parastomal hernias  2009    Dr. Alvan Dame [facility],  Hematoma evac POD#1  . Eye surgery      cataracts removed  . Cardiac catheterization  07/04/2001  . Colostomy  08/03/2001    Rise Patience, MD  . Lucianne Lei  Garfield Cornea, MD  . Hernia repair  [phone]  x [phone]  . Clavicle surgery  1952    Separation   Family History  Problem Relation Age of Onset  . Heart disease Sister   . Heart disease Brother   . Cancer Brother   . Heart disease Sister   . Kidney disease Sister   . Cancer Sister    History  Substance Use Topics  . Smoking status: Former Smoker -- 37 years     Types: Cigarettes  . Smokeless tobacco: Never Used  . Alcohol Use: 0.6 oz/week    1 Glasses of wine per week     Comment: wine occasionally with supper     Review of Systems  Gastrointestinal: Positive for nausea and abdominal pain.  All other systems reviewed and are negative.      Allergies  Augmentin  Home Medications   Current Outpatient Rx  Name  Route  Sig  Dispense  Refill  . acetaminophen (TYLENOL) 500 MG tablet   Oral   Take 1,000 mg by mouth every 6 (six) hours as needed. For pain         . albuterol (PROVENTIL HFA;VENTOLIN HFA) 108 (90 BASE) MCG/ACT inhaler   Inhalation   Inhale 2 puffs into the lungs every 4 (four) hours as needed for shortness of breath.   3 Inhaler   1   . aspirin EC 81 MG tablet   Oral   Take 81 mg by mouth at bedtime.          Marland Kitchen buPROPion (WELLBUTRIN SR) 150 MG 12 hr tablet   Oral   Take 1 tablet (150 mg total) by mouth 3 (three) times daily.   90 tablet   0   . Calcium-Magnesium-Vitamin D (CALCIUM MAGNESIUM PO)   Oral   Take 1 tablet by mouth every morning.          . gabapentin (NEURONTIN) 100 MG capsule   Oral   Take 200 mg by mouth at bedtime. In addition to 300mg , per his dermatologist         . gabapentin (NEURONTIN) 300 MG capsule   Oral   Take 300 mg by mouth 3 (three) times daily.         Marland Kitchen glucosamine-chondroitin 500-400 MG tablet   Oral   Take 1 tablet by mouth 2 (two) times daily.          Marland Kitchen ketoconazole (NIZORAL) 2 % shampoo   Topical   Apply 1 application topically 2 (two) times a week.   120 mL   2   . Multiple Vitamin (MULTIVITAMIN WITH MINERALS) TABS   Oral   Take 1 tablet by mouth every morning.          . nitrofurantoin (MACRODANTIN) 50 MG capsule   Oral   Take 1 capsule (50 mg total) by mouth at bedtime.   90 capsule   0   . omega-3 acid ethyl esters (LOVAZA) 1 G capsule   Oral   Take 1 g by mouth 2 (two) times daily.         Marland Kitchen selenium sulfide (SELSUN) 2.5 % shampoo    Topical   Apply 1 application topically daily as needed (Applies to scalp for dandruff or itching.).          Marland Kitchen simvastatin (ZOCOR) 20 MG tablet   Oral   Take 20 mg by mouth at bedtime. One nightly to control cholesterol         . sodium chloride (OCEAN) 0.65 % nasal  spray   Nasal   Place 1 spray into the nose daily as needed. For nasal decongestant         . sorbitol 70 % solution   Oral   Take 15 mLs by mouth daily as needed. constipation   473 mL   2   . tiotropium (SPIRIVA) 18 MCG inhalation capsule   Inhalation   Place 1 capsule (18 mcg total) into inhaler and inhale daily.   90 capsule   0   . triamcinolone cream (KENALOG) 0.1 %   Topical   Apply 1 application topically as needed (to scalp for dandruff).         . vitamin C (ASCORBIC ACID) 500 MG tablet   Oral   Take 500 mg by mouth daily.         . methenamine (HIPREX) 1 G tablet   Oral   Take 1 tablet (1 g total) by mouth daily as needed. Taken if patient has kidney infection. Alternating with Cipro.   90 tablet   0    BP 114/65  Pulse 84  Temp(Src) 98.2 F (36.8 C) (Oral)  Resp 16  SpO2 93% Physical Exam  Nursing note and vitals reviewed. Constitutional: He is oriented to person, place, and time. He appears well-developed and well-nourished.  HENT:  Head: Normocephalic and atraumatic.  Mouth/Throat: Oropharynx is clear and moist.  Eyes: Conjunctivae and EOM are normal. Pupils are equal, round, and reactive to light.  Neck: Normal range of motion.  Cardiovascular: Normal rate, regular rhythm and normal heart sounds.   Pulmonary/Chest: Effort normal and breath sounds normal. No respiratory distress. He has no wheezes.  Abdominal: Soft. Bowel sounds are normal. He exhibits distension (mild). There is tenderness in the epigastric area and periumbilical area. There is no rigidity and no guarding.  Urostomy bag in place on right abdomen; small amount of yellow urine in collection bag Colostomy bag  on left abdomen with no stool output  Musculoskeletal: Normal range of motion.  Neurological: He is alert and oriented to person, place, and time.  Skin: Skin is warm and dry.  Psychiatric: He has a normal mood and affect.    ED Course  Procedures (including critical care time) Labs Review Labs Reviewed  CBC WITH DIFFERENTIAL - Abnormal; Notable for the following:    Monocytes Relative 14 (*)    All other components within normal limits  COMPREHENSIVE METABOLIC PANEL - Abnormal; Notable for the following:    Sodium 135 (*)    Glucose, Bld 116 (*)    BUN 31 (*)    Creatinine, Ser 1.40 (*)    Albumin 3.4 (*)    AST 70 (*)    ALT 68 (*)    GFR calc non Af Amer 46 (*)    GFR calc Af Amer 53 (*)    All other components within normal limits  URINALYSIS, ROUTINE W REFLEX MICROSCOPIC - Abnormal; Notable for the following:    Color, Urine AMBER (*)    APPearance TURBID (*)    Hgb urine dipstick SMALL (*)    Protein, ur 100 (*)    Nitrite POSITIVE (*)    Leukocytes, UA LARGE (*)    All other components within normal limits  URINE MICROSCOPIC-ADD ON - Abnormal; Notable for the following:    Squamous Epithelial / LPF FEW (*)    Bacteria, UA MANY (*)    All other components within normal limits  URINE CULTURE  LIPASE, BLOOD  Imaging Review Ct Abdomen Pelvis W Contrast  02/26/2014   CLINICAL DATA:  Abdominal pain, nausea and constipation. History of prostate carcinoma and prior cystectomy.  EXAM: CT ABDOMEN AND PELVIS WITH CONTRAST  TECHNIQUE: Multidetector CT imaging of the abdomen and pelvis was performed using the standard protocol following bolus administration of intravenous contrast.  CONTRAST:  133mL OMNIPAQUE IOHEXOL 300 MG/ML  SOLN  COMPARISON:  SP PERC TUBE CHANGE W/CM dated 02/07/2014; IR NEPHROSTOGRAM*R* dated 12/27/2013; IR PERC NEPHROSTOMY*R* dated 12/17/2013; CT ABD/PELVIS W CM dated 03/25/2013; CT ABD/PELVIS W CM dated 11/08/2012  FINDINGS: Recurrent small bowel obstruction  is identified with multiple dilated small bowel loops identified. Maximal caliber of small bowel in the central abdomen approaches 5 cm. As small bowel is followed inferiorly, there is enhancement of loops in the lower pelvis including a loop that extends into what appears to be a right-sided perirectal/perineal hernia extending into the perirectal fat. At this level, the patient also has brachytherapy seeds identified in the region of the prostate gland. It is suspected that there may be strictured small bowel in this region as well.  No evidence of free intraperitoneal air, fistula or focal abscess. There is a stable chronic left-sided parastomal ventral hernia containing colon. Right-sided nephroureteral catheter enters an ileostomy and ascends up the right ureter. There is no evidence of hydronephrosis. The right kidney is chronically atrophic compared to the left.  There is a stable 5 cm infrarenal abdominal aortic aneurysm without evidence of rupture. The liver shows mild steatosis without focal mass. No biliary ductal dilatation is seen. The pancreas, spleen and adrenal glands are unremarkable. No soft tissue masses or enlarged lymph nodes are seen. Bony structures show chronic and stable compression fracture of the L1 vertebral body.  IMPRESSION: Recurrent high-grade small bowel obstruction without evidence of bowel perforation. Transition point in small bowel appears to be deep in the inferior pelvis at the level of some irregular appearing small bowel loops near brachytherapy seeds as well as a right-sided perirectal hernia. Bowel in this region may be relatively strictured.   Electronically Signed   By: Aletta Edouard M.D.   On: 02/26/2014 17:14     EKG Interpretation None      MDM   Diagnosis: 1.  Recurrent high grade SBO 2.  UTI 3.  S/p colostomy placement 4.  Urostomy placement  Labs as above. UA nitrite positive, culture pending. Patient started on IV Rocephin.  CT revealing recurrent  high grade SBO.  NG being placed.  Discussed with general surgery, Dr. Lucia Gaskins who will follow in consult.  Discussed with hospitalist, Dr. Jerilee Hoh who will admit.    Larene Pickett, PA-C 02/26/14 2324

## 2014-02-26 NOTE — ED Notes (Signed)
abd pain since last night; nausea last night

## 2014-02-26 NOTE — Progress Notes (Signed)
Utilization Review completed.  Latish Toutant RN CM  

## 2014-02-27 ENCOUNTER — Inpatient Hospital Stay (HOSPITAL_COMMUNITY): Payer: Medicare Other

## 2014-02-27 DIAGNOSIS — N183 Chronic kidney disease, stage 3 unspecified: Secondary | ICD-10-CM

## 2014-02-27 DIAGNOSIS — J449 Chronic obstructive pulmonary disease, unspecified: Secondary | ICD-10-CM

## 2014-02-27 DIAGNOSIS — IMO0002 Reserved for concepts with insufficient information to code with codable children: Secondary | ICD-10-CM

## 2014-02-27 DIAGNOSIS — N390 Urinary tract infection, site not specified: Secondary | ICD-10-CM

## 2014-02-27 LAB — CBC
HCT: 38 % — ABNORMAL LOW (ref 39.0–52.0)
Hemoglobin: 13 g/dL (ref 13.0–17.0)
MCH: 32.5 pg (ref 26.0–34.0)
MCHC: 34.2 g/dL (ref 30.0–36.0)
MCV: 95 fL (ref 78.0–100.0)
Platelets: 174 10*3/uL (ref 150–400)
RBC: 4 MIL/uL — ABNORMAL LOW (ref 4.22–5.81)
RDW: 14 % (ref 11.5–15.5)
WBC: 4.1 10*3/uL (ref 4.0–10.5)

## 2014-02-27 LAB — BASIC METABOLIC PANEL
BUN: 32 mg/dL — ABNORMAL HIGH (ref 6–23)
CALCIUM: 9.2 mg/dL (ref 8.4–10.5)
CO2: 24 mEq/L (ref 19–32)
Chloride: 98 mEq/L (ref 96–112)
Creatinine, Ser: 1.5 mg/dL — ABNORMAL HIGH (ref 0.50–1.35)
GFR, EST AFRICAN AMERICAN: 49 mL/min — AB (ref >90)
GFR, EST NON AFRICAN AMERICAN: 42 mL/min — AB (ref >90)
Glucose, Bld: 96 mg/dL (ref 70–99)
Potassium: 4.5 mEq/L (ref 3.7–5.3)
Sodium: 135 mEq/L — ABNORMAL LOW (ref 137–147)

## 2014-02-27 NOTE — Progress Notes (Addendum)
General surgery attending:  I've interviewed and examined this patient this morning. I have discussed his presentation with Dr. Alphonsa Overall. I reviewed all his imaging studies. I agree with the assessment and treatment plan outlined by Ms. Dort, PA  He states that he has no nausea or vomiting, but has not had any output from his colostomy. His urostomy is working well. Minimal pain.  Exam reveals that his abdomen is slightly tympanitic but soft and nontender. Parastomal hernia is soft and partially reducible  Abdominal x-ray showed that contrast has moved into the ascending colon, but he still has some significant small bowel distention.  Assessment/plan. I think that he has a partial small bowel obstruction from adhesions deep within the pelvis, where his prior cystectomy was performed. No clinical evidence of compromise bowel at this time.Marland KitchenHopefully this will resolve without operative intervention. If it does not, the complexity of this surgery would mandate transfer to a tertiary care center, and we would contact Dr. Lawrence Santiago and his general surgeon Colleagues at the [facility]. NPO. Check belly films tomorrow.   Ureteral stricture, chronic nephroureteral stenting  Has been followed by Dr. Mattie Marlin at [facility].  He is getting a stent replaced every 6 weeks.    Parastomal hernia at left colostomy.  This has been repaired at least once at [facility].  This is a large parastomal hernia, which is not Because of his SB.Marland Kitchen   COPD  Hypercholesterolemia  History of prostate cancer - Had seed implant around 7741, but then complications from the seeds.  Rectouretheral fistula - S/p cystectomy, ileal conduit, ostomy - 2002 - [facility]  History of coronary artery disease  5.0 cm AAA   Mildly elevated creatinine - 02/28/2104 - 1.5  History of psoriasis.   Michael Novak. Michael Novak, M.D., [facility] General and Minimally invasive Surgery Breast and Colorectal Surgery Office:    [phone] Pager:   [phone]

## 2014-02-27 NOTE — Progress Notes (Signed)
INITIAL NUTRITION ASSESSMENT  DOCUMENTATION CODES Per approved criteria  -Not Applicable   INTERVENTION: - Diet advancement per MD - Will continue to monitor   NUTRITION DIAGNOSIS: Inadequate oral intake related to inability to eat as evidenced by NPO.    Goal: Advance diet as tolerated to low fiber diet  Monitor:  Weights, labs, diet advancement  Reason for Assessment: Malnutrition screening tool   81 y.o. male  Admitting Dx: Small bowel obstruction  ASSESSMENT: Pt discussed during multidisciplinary rounds. Has hx of colostomy, prostate and skin CA, COPD, depression, UTI, abdominal aortic aneurysm, and diverticulosis.   Admitted with abdominal pain that started Monday night in addition to nausea. Pt reports he has not eaten since then. States he typically has a good appetite and eats 3 healthy well balanced meals/day with a usual body weight of 190 pounds. Nutrition focused physical exam performed which was all WNL. Pt denies any nausea today, eager to have diet advanced. Only c/o light headedness. Abdominal pain mildly improved. Found to have recurrent small bowel obstruction. Surgery following.  BUN/Cr elevated with low GFR AST/ALT elevated    Height: Ht Readings from Last 1 Encounters:  02/26/14 6\' 1"  (1.854 m)    Weight: Wt Readings from Last 1 Encounters:  02/26/14 188 lb 6.4 oz (85.458 kg)    Ideal Body Weight: 184 lbs  % Ideal Body Weight: 102%  Wt Readings from Last 10 Encounters:  02/26/14 188 lb 6.4 oz (85.458 kg)  01/01/14 193 lb (87.544 kg)  08/14/13 193 lb 12.8 oz (87.907 kg)  06/27/13 193 lb (87.544 kg)  05/09/13 194 lb (87.998 kg)  04/01/13 198 lb 3.2 oz (89.903 kg)  01/22/13 202 lb 9.6 oz (91.899 kg)  10/24/12 200 lb 9.6 oz (90.992 kg)  07/26/12 218 lb (98.884 kg)  06/21/12 218 lb 0.6 oz (98.9 kg)    Usual Body Weight: 190 lbs   % Usual Body Weight: 99%  BMI:  Body mass index is 24.86 kg/(m^2).  Estimated Nutritional Needs: Kcal:  2100-2300 Protein: 105-125g Fluid: 2.1-2.3L/day   Skin: intact  Diet Order: NPO  EDUCATION NEEDS: -No education needs identified at this time   Intake/Output Summary (Last 24 hours) at 02/27/14 1235 Last data filed at 02/27/14 0800  Gross per 24 hour  Intake  232.5 ml  Output   1050 ml  Net -817.5 ml    Last BM: 3/20  Labs:   Recent Labs Lab 02/26/14 1428 02/27/14 0628  NA 135* 135*  K 4.8 4.5  CL 99 98  CO2 24 24  BUN 31* 32*  CREATININE 1.40* 1.50*  CALCIUM 9.6 9.2  GLUCOSE 116* 96    CBG (last 3)  No results found for this basename: GLUCAP,  in the last 72 hours  Scheduled Meds: . cefTRIAXone (ROCEPHIN)  IV  1 g Intravenous Q24H  . enoxaparin (LOVENOX) injection  40 mg Subcutaneous Q24H  . tiotropium  18 mcg Inhalation Daily    Continuous Infusions: . sodium chloride 75 mL/hr at 02/26/14 2130    Past Medical History  Diagnosis Date  . History of colostomy   . Urostomy stenosis     Ask patient to clarify, per history form dated 04/02/11.  . Prostate cancer   . Skin cancer   . Neuropathy     in feet   . COPD (chronic obstructive pulmonary disease)   . Depression   . UTI (lower urinary tract infection)     history   . AAA (abdominal aortic  aneurysm)   . 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 Dyslipidemia 12/14/2011  . Bradycardia, HR 30-40s, Nov-Dec2012 11/26/2011  . 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 Loss of weight 08/22/2012  . Seborrhea capitis 02/29/2012  . Unspecified erythematous condition 02/29/2012  . Thrombocytopenia, unspecified 12/22/2011  . Acute kidney failure, unspecified 12/22/2011  . Pyelonephritis, unspecified 12/22/2011  . Inguinal hernia without mention of obstruction or gangrene, unilateral or unspecified, (not specified as recurrent) 12/01/2011  . Chest pain, unspecified 12/01/2011  . Acute upper respiratory infections of unspecified site 10/02/2009  . Sebaceous cyst  01/22/2009  . Carbuncle and furuncle of buttock 01/08/2009  . Ventral hernia, unspecified, without mention of obstruction or gangrene 12/04/2008  . Incisional hernia without mention of obstruction or gangrene 03/20/2008  . Gout, unspecified 02/15/2008  . Other malaise and fatigue 10.24/2008  . Senile cataract, unspecified 02/18/2006  . Pain in joint, multiple sites 08/30/2005  . Diverticulosis of colon (without mention of hemorrhage) 02/11/2005  . Lumbago 09/25/2003  . Edema 09/25/2003  . 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 Unspecified hereditary and idiopathic peripheral neuropathy 08/10/2000  . Coronary atherosclerosis of unspecified type of vessel, native or graft 08/10/1996  . Acute myocardial infarction, unspecified site, episode of care unspecified 11/30/1995  . Lyme disease 11/30/1987  . Psoriasis 12/26/13    [facility] hospital.    Past Surgical History  Procedure Laterality Date  . Cystectomy, ileal coduit, colostomy  2002    for severe rectourethral fistula  . Ex lap, lysis of adhesions  2010    for SBO  . Lap repair of parastomal hernias  2009    Dr. Alvan Novak [facility],  Hematoma evac POD#1  . Eye surgery      cataracts removed  . Cardiac catheterization  07/04/2001  . Colostomy  08/03/2001    Michael Patience, MD  . Michael Lei  Garfield Cornea, MD  . Hernia repair  1970    x 4  . Clavicle surgery  1952    Separation    Michael College MS, [place], [place] Pager [phone] After Hours Pager

## 2014-02-27 NOTE — Progress Notes (Signed)
Subjective: The patients pain is mildly improved, but still doesn't "feel right".  Some nausea, no vomiting.  Hasn't been OOB.  Doesn't want surgery, says he's had too many surgeries and he's worried about getting worse after the surgery and not being independent or wonders if he would make it through a surgery.  When asked about transferring to [facility] the patient says he would have to think about it and talk with his wife.  Objective: Vital signs in last 24 hours: Temp:  [98 F (36.7 C)-98.9 F (37.2 C)] 98.9 F (37.2 C) (04/01 0614) Pulse Rate:  [63-84] 80 (04/01 0614) Resp:  [16-21] 18 (04/01 0614) BP: (99-128)/(54-69) 103/62 mmHg (04/01 0614) SpO2:  [91 %-93 %] 91 % (04/01 0917) FiO2 (%):  [32 %] 32 % (04/01 0917) Weight:  [188 lb 6.4 oz (85.458 kg)] 188 lb 6.4 oz (85.458 kg) (03/31 2039) Last BM Date: 02/15/14  Intake/Output from previous day: 03/31 0701 - 04/01 0700 In: 232.5 [I.V.:182.5; IV Piggyback:50] Out: 400 [Urine:400] Intake/Output this shift: Total I/O In: -  Out: 650 [Urine:650]  PE: Gen:  Alert, NAD, pleasant Abd: Soft, mild lower abdominal tenderness, distended, few BS, numerous well healed abdominal scars, urostomy on right, colostomy on left with large parastomal hernia palpated which is soft.  Colostomy has a small smear of brown stool and gas, but bag is otherwise empty, urostomy with urine output.    Lab Results:   Recent Labs  02/26/14 1428 02/27/14 0628  WBC 5.7 4.1  HGB 14.2 13.0  HCT 40.9 38.0*  PLT 213 174   BMET  Recent Labs  02/26/14 1428 02/27/14 0628  NA 135* 135*  K 4.8 4.5  CL 99 98  CO2 24 24  GLUCOSE 116* 96  BUN 31* 32*  CREATININE 1.40* 1.50*  CALCIUM 9.6 9.2   PT/INR No results found for this basename: LABPROT, INR,  in the last 72 hours CMP     Component Value Date/Time   NA 135* 02/27/2014 0628   NA 144 08/09/2013 0813   K 4.5 02/27/2014 0628   CL 98 02/27/2014 0628   CO2 24 02/27/2014 0628   GLUCOSE 96 02/27/2014  0628   GLUCOSE 87 08/09/2013 0813   BUN 32* 02/27/2014 0628   BUN 18 08/09/2013 0813   CREATININE 1.50* 02/27/2014 0628   CALCIUM 9.2 02/27/2014 0628   PROT 8.3 02/26/2014 1428   PROT 6.6 08/09/2013 0813   ALBUMIN 3.4* 02/26/2014 1428   AST 70* 02/26/2014 1428   ALT 68* 02/26/2014 1428   ALKPHOS 58 02/26/2014 1428   BILITOT 0.7 02/26/2014 1428   GFRNONAA 42* 02/27/2014 0628   GFRAA 49* 02/27/2014 0628   Lipase     Component Value Date/Time   LIPASE 25 02/26/2014 1428       Studies/Results: Ct Abdomen Pelvis W Contrast  02/26/2014   CLINICAL DATA:  Abdominal pain, nausea and constipation. History of prostate carcinoma and prior cystectomy.  EXAM: CT ABDOMEN AND PELVIS WITH CONTRAST  TECHNIQUE: Multidetector CT imaging of the abdomen and pelvis was performed using the standard protocol following bolus administration of intravenous contrast.  CONTRAST:  116mL OMNIPAQUE IOHEXOL 300 MG/ML  SOLN  COMPARISON:  SP PERC TUBE CHANGE W/CM dated 02/07/2014; IR NEPHROSTOGRAM*R* dated 12/27/2013; IR PERC NEPHROSTOMY*R* dated 12/17/2013; CT ABD/PELVIS W CM dated 03/25/2013; CT ABD/PELVIS W CM dated 11/08/2012  FINDINGS: Recurrent small bowel obstruction is identified with multiple dilated small bowel loops identified. Maximal caliber of small bowel in the central  abdomen approaches 5 cm. As small bowel is followed inferiorly, there is enhancement of loops in the lower pelvis including a loop that extends into what appears to be a right-sided perirectal/perineal hernia extending into the perirectal fat. At this level, the patient also has brachytherapy seeds identified in the region of the prostate gland. It is suspected that there may be strictured small bowel in this region as well.  No evidence of free intraperitoneal air, fistula or focal abscess. There is a stable chronic left-sided parastomal ventral hernia containing colon. Right-sided nephroureteral catheter enters an ileostomy and ascends up the right ureter. There is  no evidence of hydronephrosis. The right kidney is chronically atrophic compared to the left.  There is a stable 5 cm infrarenal abdominal aortic aneurysm without evidence of rupture. The liver shows mild steatosis without focal mass. No biliary ductal dilatation is seen. The pancreas, spleen and adrenal glands are unremarkable. No soft tissue masses or enlarged lymph nodes are seen. Bony structures show chronic and stable compression fracture of the L1 vertebral body.  IMPRESSION: Recurrent high-grade small bowel obstruction without evidence of bowel perforation. Transition point in small bowel appears to be deep in the inferior pelvis at the level of some irregular appearing small bowel loops near brachytherapy seeds as well as a right-sided perirectal hernia. Bowel in this region may be relatively strictured.   Electronically Signed   By: Aletta Edouard M.D.   On: 02/26/2014 17:14    Anti-infectives: Anti-infectives   Start     Dose/Rate Route Frequency Ordered Stop   02/27/14 1600  cefTRIAXone (ROCEPHIN) 1 g in dextrose 5 % 50 mL IVPB     1 g 100 mL/hr over 30 Minutes Intravenous Every 24 hours 02/26/14 2018     02/26/14 2000  cefTRIAXone (ROCEPHIN) 1 g in dextrose 5 % 50 mL IVPB  Status:  Discontinued     1 g 100 mL/hr over 30 Minutes Intravenous Every 24 hours 02/26/14 1950 02/26/14 2017   02/26/14 1630  cefTRIAXone (ROCEPHIN) 1 g in dextrose 5 % 50 mL IVPB     1 g 100 mL/hr over 30 Minutes Intravenous  Once 02/26/14 1615 02/26/14 1743       Assessment/Plan 1. SBO, recurrent   CT evidence of source of obstruction in pelvis. Also has significant parastomal hernia.   NPO, IVF, NG tube, pain control, antiemetics, repeat KUB pending  Hopefully he will resolve without surgical intervention through conservative management  If he were to need surgical intervention he would need to be transferred to [facility] due to his past surgery  and complexity of surgical case  The patient does NOT  want surgery and does not think he would consent to a surgery. 2. Ureteral stricture, chronic nephroureteral stenting   Has been followed by Dr. Mattie Marlin at [facility].   He is getting a stent replaced every 6 weeks.  3. Parastomal hernia at left colostomy.   This has been repaired at least once at [facility].   This is a large parastomal hernia, which is not tender.  4. COPD  5. Hypercholesterolemia  6. History of prostate cancer - Had seed implant around 9678, but then complications from the seeds.  7. Rectouretheral fistula - S/p cystectomy, ileal conduit, ostomy - 2002 - [facility]  8. History of coronary artery disease  9. 5.0 cm AAA  10. Mildly elevated creatinine - 02/28/2104 - 1.5 11. History of psoriasis.     LOS: 1 day  Coralie Keens 02/27/2014, 9:50 AM Pager: [phone]

## 2014-02-27 NOTE — Progress Notes (Signed)
[facility] PROGRESS NOTE  Michael Novak:[id number] DOB: July 02, 1930 DOA: 02/26/2014 PCP: Estill Dooms, MD  Assessment/Plan: Principal Problem:  Small bowel obstruction  Active Problems:  Hydronephrosis, right status post right nephrostomy tube placement  H/O colostomy  CKD (chronic kidney disease) stage 3, GFR 30-59 ml/min  SBO (small bowel obstruction)   80 y/o male with PMH of COPD, prostate cancer status post radioactive seed implantation complicated by prostatic rectal fistula status post cystoprostatectomy with ileal conduit as well as proctocolectomy with colostomy as well as right-sided nephrostomy tube placed in July of 2013 and exchanged every 6 weeks presented with  no colostomy output is admitted with bowel obstruction   1. Recurrent small bowel obstruction  -CT category as this is a high-grade obstruction with a transition point.  -appreciate surgical care;  NPO, IVF; patient declined surgery if indicated   2. History of hydronephrosis status post right nephrostomy tube placement; h/o cystoprostatectomy  -This is exchanged every 6 weeks, not due until mid-May.  -probable UTI; started on IV atx; pend cultures  3. Chronic kidney disease stage III  -Creatinine at baseline of 1.4-1.6. 4. COPD stable; cont home regimen   Code Status: full Family Communication: d/w  patient (indicate person spoken with, relationship, and if by phone, the number) Disposition Plan: home pend clinical improvement    Consultants:  surgery  Procedures:  None   Antibiotics:  Ceftriaxone 4/1<<<<   (indicate start date, and stop date if known)  HPI/Subjective: alert  Objective: Filed Vitals:   02/27/14 0614  BP: 103/62  Pulse: 80  Temp: 98.9 F (37.2 C)  Resp: 18    Intake/Output Summary (Last 24 hours) at 02/27/14 1130 Last data filed at 02/27/14 0800  Gross per 24 hour  Intake  232.5 ml  Output   1050 ml  Net -817.5 ml   Filed Weights   02/26/14 2039   Weight: 85.458 kg (188 lb 6.4 oz)    Exam:   General:  alert  Cardiovascular: s1,s2 rrr  Respiratory: CTA BL  Abdomen: soft, colostomy present; nephrostomy   Musculoskeletal: noLE edema   Data Reviewed: Basic Metabolic Panel:  Recent Labs Lab 02/26/14 1428 02/27/14 0628  NA 135* 135*  K 4.8 4.5  CL 99 98  CO2 24 24  GLUCOSE 116* 96  BUN 31* 32*  CREATININE 1.40* 1.50*  CALCIUM 9.6 9.2   Liver Function Tests:  Recent Labs Lab 02/26/14 1428  AST 70*  ALT 68*  ALKPHOS 58  BILITOT 0.7  PROT 8.3  ALBUMIN 3.4*    Recent Labs Lab 02/26/14 1428  LIPASE 25   No results found for this basename: AMMONIA,  in the last 168 hours CBC:  Recent Labs Lab 02/26/14 1428 02/27/14 0628  WBC 5.7 4.1  NEUTROABS 3.6  --   HGB 14.2 13.0  HCT 40.9 38.0*  MCV 93.0 95.0  PLT 213 174   Cardiac Enzymes: No results found for this basename: CKTOTAL, CKMB, CKMBINDEX, TROPONINI,  in the last 168 hours BNP (last 3 results) No results found for this basename: PROBNP,  in the last 8760 hours CBG: No results found for this basename: GLUCAP,  in the last 168 hours  No results found for this or any previous visit (from the past 240 hour(s)).   Studies: Ct Abdomen Pelvis W Contrast  02/26/2014   CLINICAL DATA:  Abdominal pain, nausea and constipation. History of prostate carcinoma and prior cystectomy.  EXAM: CT ABDOMEN AND PELVIS WITH CONTRAST  TECHNIQUE: Multidetector CT imaging of the abdomen and pelvis was performed using the standard protocol following bolus administration of intravenous contrast.  CONTRAST:  133mL OMNIPAQUE IOHEXOL 300 MG/ML  SOLN  COMPARISON:  SP PERC TUBE CHANGE W/CM dated 02/07/2014; IR NEPHROSTOGRAM*R* dated 12/27/2013; IR PERC NEPHROSTOMY*R* dated 12/17/2013; CT ABD/PELVIS W CM dated 03/25/2013; CT ABD/PELVIS W CM dated 11/08/2012  FINDINGS: Recurrent small bowel obstruction is identified with multiple dilated small bowel loops identified. Maximal caliber  of small bowel in the central abdomen approaches 5 cm. As small bowel is followed inferiorly, there is enhancement of loops in the lower pelvis including a loop that extends into what appears to be a right-sided perirectal/perineal hernia extending into the perirectal fat. At this level, the patient also has brachytherapy seeds identified in the region of the prostate gland. It is suspected that there may be strictured small bowel in this region as well.  No evidence of free intraperitoneal air, fistula or focal abscess. There is a stable chronic left-sided parastomal ventral hernia containing colon. Right-sided nephroureteral catheter enters an ileostomy and ascends up the right ureter. There is no evidence of hydronephrosis. The right kidney is chronically atrophic compared to the left.  There is a stable 5 cm infrarenal abdominal aortic aneurysm without evidence of rupture. The liver shows mild steatosis without focal mass. No biliary ductal dilatation is seen. The pancreas, spleen and adrenal glands are unremarkable. No soft tissue masses or enlarged lymph nodes are seen. Bony structures show chronic and stable compression fracture of the L1 vertebral body.  IMPRESSION: Recurrent high-grade small bowel obstruction without evidence of bowel perforation. Transition point in small bowel appears to be deep in the inferior pelvis at the level of some irregular appearing small bowel loops near brachytherapy seeds as well as a right-sided perirectal hernia. Bowel in this region may be relatively strictured.   Electronically Signed   By: Aletta Edouard M.D.   On: 02/26/2014 17:14    Scheduled Meds: . cefTRIAXone (ROCEPHIN)  IV  1 g Intravenous Q24H  . enoxaparin (LOVENOX) injection  40 mg Subcutaneous Q24H  . tiotropium  18 mcg Inhalation Daily   Continuous Infusions: . sodium chloride 75 mL/hr at 02/26/14 2130    Principal Problem:   Small bowel obstruction Active Problems:   Hydronephrosis, right  status post right nephrostomy tube placement   H/O colostomy   CKD (chronic kidney disease) stage 3, GFR 30-59 ml/min   SBO (small bowel obstruction)    Time spent: >35 minutes     Kinnie Feil  [facility] Pager [phone]. If 7PM-7AM, please contact night-coverage at [email or web address], password [email or web address] 02/27/2014, 11:30 AM  LOS: 1 day

## 2014-02-28 ENCOUNTER — Inpatient Hospital Stay (HOSPITAL_COMMUNITY): Payer: Medicare Other

## 2014-02-28 DIAGNOSIS — N1330 Unspecified hydronephrosis: Secondary | ICD-10-CM

## 2014-02-28 DIAGNOSIS — D649 Anemia, unspecified: Secondary | ICD-10-CM

## 2014-02-28 LAB — CBC WITH DIFFERENTIAL/PLATELET
Basophils Absolute: 0 10*3/uL (ref 0.0–0.1)
Basophils Relative: 0 % (ref 0–1)
Eosinophils Absolute: 0.1 10*3/uL (ref 0.0–0.7)
Eosinophils Relative: 1 % (ref 0–5)
HEMATOCRIT: 39.2 % (ref 39.0–52.0)
Hemoglobin: 13.5 g/dL (ref 13.0–17.0)
LYMPHS ABS: 1.1 10*3/uL (ref 0.7–4.0)
LYMPHS PCT: 28 % (ref 12–46)
MCH: 32.5 pg (ref 26.0–34.0)
MCHC: 34.4 g/dL (ref 30.0–36.0)
MCV: 94.5 fL (ref 78.0–100.0)
MONO ABS: 0.7 10*3/uL (ref 0.1–1.0)
Monocytes Relative: 17 % — ABNORMAL HIGH (ref 3–12)
NEUTROS ABS: 2.1 10*3/uL (ref 1.7–7.7)
Neutrophils Relative %: 53 % (ref 43–77)
Platelets: 182 10*3/uL (ref 150–400)
RBC: 4.15 MIL/uL — AB (ref 4.22–5.81)
RDW: 14 % (ref 11.5–15.5)
WBC: 3.9 10*3/uL — AB (ref 4.0–10.5)

## 2014-02-28 LAB — BASIC METABOLIC PANEL
BUN: 34 mg/dL — ABNORMAL HIGH (ref 6–23)
CHLORIDE: 99 meq/L (ref 96–112)
CO2: 23 meq/L (ref 19–32)
CREATININE: 1.28 mg/dL (ref 0.50–1.35)
Calcium: 9.1 mg/dL (ref 8.4–10.5)
GFR calc Af Amer: 59 mL/min — ABNORMAL LOW (ref >90)
GFR calc non Af Amer: 51 mL/min — ABNORMAL LOW (ref >90)
Glucose, Bld: 78 mg/dL (ref 70–99)
Potassium: 4.3 mEq/L (ref 3.7–5.3)
Sodium: 137 mEq/L (ref 137–147)

## 2014-02-28 MED ORDER — MENTHOL 3 MG MT LOZG
1.0000 | LOZENGE | OROMUCOSAL | Status: DC | PRN
  Filled 2014-02-28: qty 9

## 2014-02-28 MED ORDER — PHENOL 1.4 % MT LIQD
2.0000 | OROMUCOSAL | Status: DC | PRN
  Administered 2014-02-28: 2 via OROMUCOSAL
  Filled 2014-02-28: qty 177

## 2014-02-28 NOTE — Progress Notes (Signed)
Patient's wife is visiting and has a lot of questions regarding her husband's plan of care.  Spoke with Dr. Daleen Bo and Dalbert Batman to alert them to her request to discuss these things with them.

## 2014-02-28 NOTE — Progress Notes (Signed)
Patient given lozenges and chloraseptic spray left at patient's bedside for his use.

## 2014-02-28 NOTE — Progress Notes (Signed)
Spoke with patient's son regarding his dad's condition and that he would like to speak with his doctor this am before he leaves for work.  Notified Dr. Daleen Bo and he spoke with patient and his son in the room

## 2014-02-28 NOTE — Progress Notes (Signed)
Chaplain made a visit to see the patient. Patient's wife at bedside. Chaplain listened supportively as the patient and his wife talked about their lives while living in [place]. They also shared stories about their children. Chaplain provided emotional support to the patient and his wife.  02/28/14 1600  Clinical Encounter Type  Visited With Patient and family together  Visit Type Initial  Spiritual Encounters  Spiritual Needs Emotional

## 2014-02-28 NOTE — Consult Note (Signed)
WOC ostomy consult note: Patient is well known to our department from his multiple admissions for similar conditions.  Typically stool is evident behind hernia unlike this admission, where no stool is noted in colon and adhesions appear to be more distally located (i.e., in lower pelvis).  Patient requested visit from [facility] Nurse today for support; no pouch change needed at this time.  Patient has his own supplies from home (2 each of urostomy and colostomy pouches) and can change when needed.  No output for past week or so from colostomy.  Using bedside drainage bag for urostomy. NGT in place. Stoma type/location: Colostomy, LLQ slightly prolapsed and with parastomal hernia.  RLQ Urostomy res, moist, slightly budded. Stomal assessment/size: Not measured today Peristomal assessment: Not viewed today Treatment options for stomal/peristomal skin: None indicated Output Tea colored urine in bedside drainage bag from urostomy.  No output from colostomy. Ostomy pouching: 1pc. Systems over both stomas Education provided: Patient supported as he works through information presented to him today by Psychologist, sport and exercise and CS PA, [employer].  He understands that he may need to consider consultation at [facility], but is still hopeful that NGT and fluids may work as is has in the past her at [employer].  He is reminded that the films taken indicate that his situation on this admission is different than in previous visits and seems somewhat discouraged and resigned as he agrees to this.  Son has just left the facility as he had to return to work; wife is expected shortly.  Patient expresses comfort and appreciation for [facility] nurse visit today. Time spent with patient today = 45 minutes. [facility] nursing team will not follow, but will remain available to this patient, the nursing, surgical and medical teams.  Please re-consult if needed. Thanks, Maudie Flakes, MSN, RN, [place], [place], [place] [phone])

## 2014-02-28 NOTE — ED Provider Notes (Signed)
Medical screening examination/treatment/procedure(s) were performed by non-physician practitioner and as supervising physician I was immediately available for consultation/collaboration.   EKG Interpretation None       Raif Chachere R. Ohana Birdwell, MD 02/28/14 0709 

## 2014-02-28 NOTE — Progress Notes (Signed)
Subjective: The patient is about the same today.  Denies N/V, but no significant improvement since NG tube placed.  511mL green NG output.  Mobilizing some.  Abdominal pain still there in the lower pelvis.    The son came to bedside.  We had a lengthy discussion about conservative management vs surgical intervention.  See below in A/P.  Objective: Vital signs in last 24 hours: Temp:  [98.4 F (36.9 C)-99.5 F (37.5 C)] 99.5 F (37.5 C) (04/02 0656) Pulse Rate:  [74-89] 77 (04/02 0656) Resp:  [18-20] 18 (04/02 0656) BP: (114-116)/(62-69) 116/69 mmHg (04/02 0656) SpO2:  [91 %-94 %] 91 % (04/02 0656) FiO2 (%):  [32 %] 32 % (04/01 0917) Last BM Date: 02/22/14  Intake/Output from previous day: 04/01 0701 - 04/02 0700 In: 2375 [I.V.:2325; IV Piggyback:50] Out: 2000 [Urine:1450; Emesis/NG output:550] Intake/Output this shift:    PE: Gen:  Alert, NAD, pleasant Card:  RRR, no M/G/R heard Pulm:  CTA, no W/R/R Abd: Soft, mild lower abdominal tenderness, distended, few BS, numerous well healed abdominal scars, urostomy on right, colostomy on left with large parastomal hernia palpated which is soft and non-tender. Colostomy has a small smear of brown stool and gas, but bag is otherwise empty, urostomy with urine output.    Lab Results:   Recent Labs  02/27/14 0628 02/28/14 0554  WBC 4.1 3.9*  HGB 13.0 13.5  HCT 38.0* 39.2  PLT 174 182   BMET  Recent Labs  02/27/14 0628 02/28/14 0554  NA 135* 137  K 4.5 4.3  CL 98 99  CO2 24 23  GLUCOSE 96 78  BUN 32* 34*  CREATININE 1.50* 1.28  CALCIUM 9.2 9.1   PT/INR No results found for this basename: LABPROT, INR,  in the last 72 hours CMP     Component Value Date/Time   NA 137 02/28/2014 0554   NA 144 08/09/2013 0813   K 4.3 02/28/2014 0554   CL 99 02/28/2014 0554   CO2 23 02/28/2014 0554   GLUCOSE 78 02/28/2014 0554   GLUCOSE 87 08/09/2013 0813   BUN 34* 02/28/2014 0554   BUN 18 08/09/2013 0813   CREATININE 1.28 02/28/2014 0554    CALCIUM 9.1 02/28/2014 0554   PROT 8.3 02/26/2014 1428   PROT 6.6 08/09/2013 0813   ALBUMIN 3.4* 02/26/2014 1428   AST 70* 02/26/2014 1428   ALT 68* 02/26/2014 1428   ALKPHOS 58 02/26/2014 1428   BILITOT 0.7 02/26/2014 1428   GFRNONAA 51* 02/28/2014 0554   GFRAA 59* 02/28/2014 0554   Lipase     Component Value Date/Time   LIPASE 25 02/26/2014 1428       Studies/Results: Ct Abdomen Pelvis W Contrast  02/26/2014   CLINICAL DATA:  Abdominal pain, nausea and constipation. History of prostate carcinoma and prior cystectomy.  EXAM: CT ABDOMEN AND PELVIS WITH CONTRAST  TECHNIQUE: Multidetector CT imaging of the abdomen and pelvis was performed using the standard protocol following bolus administration of intravenous contrast.  CONTRAST:  146mL OMNIPAQUE IOHEXOL 300 MG/ML  SOLN  COMPARISON:  SP PERC TUBE CHANGE W/CM dated 02/07/2014; IR NEPHROSTOGRAM*R* dated 12/27/2013; IR PERC NEPHROSTOMY*R* dated 12/17/2013; CT ABD/PELVIS W CM dated 03/25/2013; CT ABD/PELVIS W CM dated 11/08/2012  FINDINGS: Recurrent small bowel obstruction is identified with multiple dilated small bowel loops identified. Maximal caliber of small bowel in the central abdomen approaches 5 cm. As small bowel is followed inferiorly, there is enhancement of loops in the lower pelvis including a loop that  extends into what appears to be a right-sided perirectal/perineal hernia extending into the perirectal fat. At this level, the patient also has brachytherapy seeds identified in the region of the prostate gland. It is suspected that there may be strictured small bowel in this region as well.  No evidence of free intraperitoneal air, fistula or focal abscess. There is a stable chronic left-sided parastomal ventral hernia containing colon. Right-sided nephroureteral catheter enters an ileostomy and ascends up the right ureter. There is no evidence of hydronephrosis. The right kidney is chronically atrophic compared to the left.  There is a stable 5 cm  infrarenal abdominal aortic aneurysm without evidence of rupture. The liver shows mild steatosis without focal mass. No biliary ductal dilatation is seen. The pancreas, spleen and adrenal glands are unremarkable. No soft tissue masses or enlarged lymph nodes are seen. Bony structures show chronic and stable compression fracture of the L1 vertebral body.  IMPRESSION: Recurrent high-grade small bowel obstruction without evidence of bowel perforation. Transition point in small bowel appears to be deep in the inferior pelvis at the level of some irregular appearing small bowel loops near brachytherapy seeds as well as a right-sided perirectal hernia. Bowel in this region may be relatively strictured.   Electronically Signed   By: Aletta Edouard M.D.   On: 02/26/2014 17:14   Dg Abd 2 Views  02/27/2014   CLINICAL DATA:  80 year old male with diffuse abdominal pain and distension. Recent small bowel obstruction. Initial encounter.  EXAM: ABDOMEN - 2 VIEW  COMPARISON:  CT Abdomen and Pelvis 02/26/2014.  FINDINGS: Right ureteral stent re- identified and grossly stable. Persistent gas-filled small bowel loops in the mid abdomen, measuring up to 67 mm diameter (50 mm yesterday). Nevertheless, oral contrast has reached the ascending colon since yesterday. Numerous surgical clips in the pelvis. On upright views, small pleural effusions are evident without pneumoperitoneum. Stable visualized osseous structures.  IMPRESSION: 1. Partial small bowel obstruction, with interval passage of oral contrast to the ascending colon, but persistently dilated small bowel loops up to 67 mm diameter. 2. No free air identified.  Small pleural effusions.   Electronically Signed   By: Lars Pinks M.D.   On: 02/27/2014 12:15    Anti-infectives: Anti-infectives   Start     Dose/Rate Route Frequency Ordered Stop   02/27/14 1600  cefTRIAXone (ROCEPHIN) 1 g in dextrose 5 % 50 mL IVPB     1 g 100 mL/hr over 30 Minutes Intravenous Every 24 hours  02/26/14 2018     02/26/14 2000  cefTRIAXone (ROCEPHIN) 1 g in dextrose 5 % 50 mL IVPB  Status:  Discontinued     1 g 100 mL/hr over 30 Minutes Intravenous Every 24 hours 02/26/14 1950 02/26/14 2017   02/26/14 1630  cefTRIAXone (ROCEPHIN) 1 g in dextrose 5 % 50 mL IVPB     1 g 100 mL/hr over 30 Minutes Intravenous  Once 02/26/14 1615 02/26/14 1743       Assessment/Plan 1. SBO, recurrent  CT (02/26/14) evidence of source of obstruction in pelvis.  Likely due to adhesions for gastrointestinal and urologic surgeries.  Also has significant parastomal hernia which does not seem obstructed. Continue NPO, IVF, NG tube, pain control, antiemetics.  His KUB (02/28/14) shows worsening bowel distension up to 7cm.  At this point since his BO has actually worsened we recommend transfer to [facility] due to his past surgical history, complexity and risk of surgery.  However, the patient and his son are  very adamant on staying here at [facility].  They feel more comfortable with the medical providers here and have a higher confidence in the medical treatment he has received here in the [facility] network compared to [place].  They had a "bad experience" at [facility] and a long hospital course last time they were there.  They are very against repeat surgery; however, if things became lift threatening I think he would consent.  With that said, may of our surgeons are hesitant on operating on him and may not offer surgery.  The family understands that if they remain here that surgery may not be an option and we may have to solely treat conservatively or in a palliative manner.  I discussed at length the hierarchy of medical care, idea of transfer to a higher level of care, and [facility] level of specialty/expertice and resources compared to here at a small community hospital.  I explained that our surgeons do not feel that he is best served here.  The patient and son ask very appropriate questions, but do not understand the big picture and the reason we  are trying to be proactive in sending him to [facility] so that he doesn't perforate or his bowel become ischemic which is ultimately what may happen if continue to treat conservatively.  The patient would like to wait and continue conservative management here at [facility] in hopes that this SBO will resolve conservatively.  Will discuss with Dr. Dalbert Batman the option of an ostomy enema xray study.  The patient and son are very thankful for our care and concern this far and were happy to engage in conversation above.    2. Ureteral stricture, chronic nephroureteral stenting  Has been followed by Dr. Mattie Marlin at [facility].  He is getting a stent replaced every 6 weeks.    3. Parastomal hernia at left colostomy.  This has been repaired at least once at [facility].  This is a large parastomal hernia, which is not tender and not the cause of his SBO  4. COPD  5. Hypercholesterolemia  6. History of prostate cancer - Had seed implant around 6256, but then complications from the seeds.  7. Rectouretheral fistula - S/p cystectomy, ileal conduit, ostomy - 2002 - [place]  8. History of coronary artery disease  9. 5.0 cm AAA - monitored by Dr. Donnetta Hutching who does not recommend surgical intervention due to high risk 10. Mildly elevated creatinine - Cr. Improved to 1.28 today 11. History of psoriasis.     LOS: 2 days    Coralie Keens 02/28/2014, 7:54 AM Pager: [phone]

## 2014-02-28 NOTE — Progress Notes (Addendum)
General surgery meds:  I have interviewed and examined this patient this afternoon. I reviewed his x-rays and had a long discussion with him and his wife.  NG tube was placed yesterday. He is no better and no worse. No stool or flatus out the colostomy. Denies pain.  On exam his abdomen is soft, nontender, but distended. Tympanitic.  X-rays showed continued small bowel obstruction, perhaps worse.  Assessment/plan. Persistent partial small bowel obstruction, high-grade.  Suspect that the point of obstruction is deep within his pelvis since the parastomal hernia seems fairly soft. Cannot completely rule out the parastomal hernia contributing to this, however. No evidence of compromise bowel at this point in time. I am concerned because he has not improved despite maximal medical therapy.  I had a long talk with him and his wife again today. I told them that he has complex intra-abdominal anatomy and problems. I told him that he is probably facing surgery soon which will require a laparotomy, lysis of adhesions, possible parastomal hernia repair. I told him that there is no evidence of gangrene or perforation at this time, but that he is at ever-increasing risk for that. I've told that he is at risk for injury to adjacent organs an injury to his urostomy.Surgery is therefore high risk. I have told them that clearly is in his best interest to consider transfer to a tertiary care center, and that [facility] would be best since Dr. Amalia Hailey is there, and has provided most of his  care and has participated in some of his prior surgeries.  Surgery can be considered here, but would  require 4-6 hour operation, I suspect.  Right now, he declines to be transferred and declines to consider surgery anywhere  and declines to make a decision and wants  to talk this over with his wife.  We will continue to follow closely.   Edsel Petrin. Dalbert Batman, M.D., [facility] General and Minimally  invasive Surgery Breast and Colorectal Surgery Office:   [phone] Pager:   [phone]

## 2014-02-28 NOTE — Progress Notes (Signed)
[facility] PROGRESS NOTE  Michael Novak XBM:[id number] DOB: 28-Apr-1930 DOA: 02/26/2014 PCP: Michael Dooms, MD  Assessment/Plan: Principal Problem:  Small bowel obstruction  Active Problems:  Hydronephrosis, right status post right nephrostomy tube placement  H/O colostomy  CKD (chronic kidney disease) stage 3, GFR 30-59 ml/min  SBO (small bowel obstruction)   80 y/o male with PMH of COPD, prostate cancer status post radioactive seed implantation complicated by prostatic rectal fistula status post cystoprostatectomy with ileal conduit as well as proctocolectomy with colostomy as well as right-sided nephrostomy tube placed in July of 2013 and exchanged every 6 weeks presented with  no colostomy output is admitted with bowel obstruction   1. Recurrent small bowel obstruction  -CT category as this is a high-grade obstruction with a transition point. Repeat xray worse; cont NG  -appreciate surgical care;  NPO, IVF; NG; patient declined surgery; d/w patient again he want sot talk to his wife about his final decision about surgery if indicated  2. History of hydronephrosis status post right nephrostomy tube placement; h/o cystoprostatectomy  -This is exchanged every 6 weeks, not due until mid-May.  -probable UTI; started on IV atx; pend cultures  3. Chronic kidney disease stage III  -Creatinine at baseline of 1.4-1.6. 4. COPD stable; cont home regimen   D/w patient confirmed in the presence of his son Michael, Novak [phone] -he is DNR  Code Status: full Family Communication: d/w  patient (indicate person spoken with, relationship, and if by phone, the number) Disposition Plan: home pend clinical improvement    Consultants:  surgery  Procedures:  None   Antibiotics:  Ceftriaxone 4/1<<<<   (indicate start date, and stop date if known)  HPI/Subjective: alert  Objective: Filed Vitals:   02/28/14 0656  BP: 116/69  Pulse: 77  Temp: 99.5 F (37.5 C)   Resp: 18    Intake/Output Summary (Last 24 hours) at 02/28/14 1035 Last data filed at 02/28/14 0600  Gross per 24 hour  Intake   2375 ml  Output   1350 ml  Net   1025 ml   Filed Weights   02/26/14 2039  Weight: 85.458 kg (188 lb 6.4 oz)    Exam:   General:  alert  Cardiovascular: s1,s2 rrr  Respiratory: CTA BL  Abdomen: soft, colostomy present; nephrostomy   Musculoskeletal: noLE edema   Data Reviewed: Basic Metabolic Panel:  Recent Labs Lab 02/26/14 1428 02/27/14 0628 02/28/14 0554  NA 135* 135* 137  K 4.8 4.5 4.3  CL 99 98 99  CO2 24 24 23   GLUCOSE 116* 96 78  BUN 31* 32* 34*  CREATININE 1.40* 1.50* 1.28  CALCIUM 9.6 9.2 9.1   Liver Function Tests:  Recent Labs Lab 02/26/14 1428  AST 70*  ALT 68*  ALKPHOS 58  BILITOT 0.7  PROT 8.3  ALBUMIN 3.4*    Recent Labs Lab 02/26/14 1428  LIPASE 25   No results found for this basename: AMMONIA,  in the last 168 hours CBC:  Recent Labs Lab 02/26/14 1428 02/27/14 0628 02/28/14 0554  WBC 5.7 4.1 3.9*  NEUTROABS 3.6  --  2.1  HGB 14.2 13.0 13.5  HCT 40.9 38.0* 39.2  MCV 93.0 95.0 94.5  PLT 213 174 182   Cardiac Enzymes: No results found for this basename: CKTOTAL, CKMB, CKMBINDEX, TROPONINI,  in the last 168 hours BNP (last 3 results) No results found for this basename: PROBNP,  in the last 8760 hours CBG: No results found for  this basename: GLUCAP,  in the last 168 hours  Recent Results (from the past 240 hour(s))  URINE CULTURE     Status: None   Collection Time    02/26/14  4:17 PM      Result Value Ref Range Status   Specimen Description URINE, RANDOM   Final   Special Requests NONE   Final   Culture  Setup Time     Final   Value: 02/26/2014 22:17     Performed at [place]     Final   Value: >=100,000 COLONIES/ML     Performed at [facility]   Culture     Final   Value: [place]     Performed at [facility]   Report  Status PENDING   Incomplete     Studies: Ct Abdomen Pelvis W Contrast  02/26/2014   CLINICAL DATA:  Abdominal pain, nausea and constipation. History of prostate carcinoma and prior cystectomy.  EXAM: CT ABDOMEN AND PELVIS WITH CONTRAST  TECHNIQUE: Multidetector CT imaging of the abdomen and pelvis was performed using the standard protocol following bolus administration of intravenous contrast.  CONTRAST:  174mL OMNIPAQUE IOHEXOL 300 MG/ML  SOLN  COMPARISON:  SP PERC TUBE CHANGE W/CM dated 02/07/2014; IR NEPHROSTOGRAM*R* dated 12/27/2013; IR PERC NEPHROSTOMY*R* dated 12/17/2013; CT ABD/PELVIS W CM dated 03/25/2013; CT ABD/PELVIS W CM dated 11/08/2012  FINDINGS: Recurrent small bowel obstruction is identified with multiple dilated small bowel loops identified. Maximal caliber of small bowel in the central abdomen approaches 5 cm. As small bowel is followed inferiorly, there is enhancement of loops in the lower pelvis including a loop that extends into what appears to be a right-sided perirectal/perineal hernia extending into the perirectal fat. At this level, the patient also has brachytherapy seeds identified in the region of the prostate gland. It is suspected that there may be strictured small bowel in this region as well.  No evidence of free intraperitoneal air, fistula or focal abscess. There is a stable chronic left-sided parastomal ventral hernia containing colon. Right-sided nephroureteral catheter enters an ileostomy and ascends up the right ureter. There is no evidence of hydronephrosis. The right kidney is chronically atrophic compared to the left.  There is a stable 5 cm infrarenal abdominal aortic aneurysm without evidence of rupture. The liver shows mild steatosis without focal mass. No biliary ductal dilatation is seen. The pancreas, spleen and adrenal glands are unremarkable. No soft tissue masses or enlarged lymph nodes are seen. Bony structures show chronic and stable compression fracture of the L1  vertebral body.  IMPRESSION: Recurrent high-grade small bowel obstruction without evidence of bowel perforation. Transition point in small bowel appears to be deep in the inferior pelvis at the level of some irregular appearing small bowel loops near brachytherapy seeds as well as a right-sided perirectal hernia. Bowel in this region may be relatively strictured.   Electronically Signed   By: Aletta Edouard M.D.   On: 02/26/2014 17:14   Dg Abd 2 Views  02/28/2014   CLINICAL DATA:  Small bowel obstruction.  EXAM: ABDOMEN - 2 VIEW  COMPARISON:  DG ABD 2 VIEWS dated 02/27/2014; CT ABD/PELVIS W CM dated 02/26/2014  FINDINGS: Right ureteral stent in stable position. NG tube noted projected over stomach. Mild basilar atelectasis. Progressive distention of small-bowel loops with small bowel loop diameter measuring up to 7 cm. Contrast is noted colon. Large left lower quadrant lateral abdominal wall hernia is present.  No free air. Surgical clips and radiation seeds at the pelvis. Degenerative changes lumbar spine and both hips.  IMPRESSION: 1. Progressive distention of small bowel. Contrast is noted in the colon. These findings are consistent with worsening adynamic ileus. Partial small bowel obstruction cannot be excluded. 2. Right ureteral stent is in stable position. NG tube noted with tip projected over stomach .   Electronically Signed   By: Marcello Moores  Register   On: 02/28/2014 08:01   Dg Abd 2 Views  02/27/2014   CLINICAL DATA:  80 year old male with diffuse abdominal pain and distension. Recent small bowel obstruction. Initial encounter.  EXAM: ABDOMEN - 2 VIEW  COMPARISON:  CT Abdomen and Pelvis 02/26/2014.  FINDINGS: Right ureteral stent re- identified and grossly stable. Persistent gas-filled small bowel loops in the mid abdomen, measuring up to 67 mm diameter (50 mm yesterday). Nevertheless, oral contrast has reached the ascending colon since yesterday. Numerous surgical clips in the pelvis. On upright views,  small pleural effusions are evident without pneumoperitoneum. Stable visualized osseous structures.  IMPRESSION: 1. Partial small bowel obstruction, with interval passage of oral contrast to the ascending colon, but persistently dilated small bowel loops up to 67 mm diameter. 2. No free air identified.  Small pleural effusions.   Electronically Signed   By: Lars Pinks M.D.   On: 02/27/2014 12:15    Scheduled Meds: . cefTRIAXone (ROCEPHIN)  IV  1 g Intravenous Q24H  . enoxaparin (LOVENOX) injection  40 mg Subcutaneous Q24H  . tiotropium  18 mcg Inhalation Daily   Continuous Infusions: . sodium chloride 75 mL/hr at 02/28/14 4010    Principal Problem:   Small bowel obstruction Active Problems:   Hydronephrosis, right status post right nephrostomy tube placement   H/O colostomy   CKD (chronic kidney disease) stage 3, GFR 30-59 ml/min   SBO (small bowel obstruction)    Time spent: >35 minutes     Kinnie Feil  [facility] Pager [phone]. If 7PM-7AM, please contact night-coverage at [email or web address], password [facility] 02/28/2014, 10:35 AM  LOS: 2 days

## 2014-03-01 LAB — BASIC METABOLIC PANEL
BUN: 30 mg/dL — ABNORMAL HIGH (ref 6–23)
CALCIUM: 9.2 mg/dL (ref 8.4–10.5)
CO2: 21 meq/L (ref 19–32)
Chloride: 101 mEq/L (ref 96–112)
Creatinine, Ser: 1.09 mg/dL (ref 0.50–1.35)
GFR calc Af Amer: 71 mL/min — ABNORMAL LOW (ref >90)
GFR calc non Af Amer: 62 mL/min — ABNORMAL LOW (ref >90)
GLUCOSE: 77 mg/dL (ref 70–99)
POTASSIUM: 4 meq/L (ref 3.7–5.3)
Sodium: 140 mEq/L (ref 137–147)

## 2014-03-01 MED ORDER — DIPHENHYDRAMINE HCL 50 MG/ML IJ SOLN
12.5000 mg | Freq: Once | INTRAMUSCULAR | Status: AC
  Administered 2014-03-01: 12.5 mg via INTRAVENOUS
  Filled 2014-03-01: qty 1

## 2014-03-01 NOTE — Progress Notes (Signed)
Discussed frustrations of potential surgery with patient, wife and son. Should patient have surgery, he prefers to have it here at [facility] instead of going to [place]. Son summoning patient records from [place] for [facility] MDs to have on hand.

## 2014-03-01 NOTE — Progress Notes (Signed)
Chaplain made a follow up visit to see the patient. Patient's wife and pastor were present and at bedside. Patient and his wife along with the pastor read the 20 Psalms and spiritual support was provided through prayer.   03/01/14 1500  Clinical Encounter Type  Visited With Patient and family together;Other (Comment) (Patient's pastor)  Visit Type Follow-up;Spiritual support

## 2014-03-01 NOTE — Progress Notes (Addendum)
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 Recurrent small bowel obstruction  -CT category as this is a high-grade obstruction with a transition point. Repeat xray worse; on NG  -NPO, IVF; NG; patient declined surgery; d/w patient again he wants to stay here -defer management to surgery, appreciate the input  2. History of hydronephrosis status post right nephrostomy tube placement; h/o cystoprostatectomy  -This is exchanged every 6 weeks, not due until mid-May.  -probable UTI; started on IV atx; pend cultures  3. Chronic kidney disease stage III  -Creatinine at baseline of 1.4-1.6. 4. COPD stable; cont home regimen   D/w patient confirmed in the presence of his son Marie, Chow [phone] -he is DNR  Code Status: full Family Communication: d/w  Patient, updated his wife (indicate person spoken with, relationship, and if by phone, the number) Disposition Plan: home pend clinical improvement    Consultants:  surgery  Procedures:  None   Antibiotics:  Ceftriaxone 4/1<<<<   (indicate start date, and stop date if known)  HPI/Subjective: alert  Objective: Filed Vitals:   03/01/14 0605  BP: 143/74  Pulse: 74  Temp: 100.1 F (37.8 C)  Resp: 18     Intake/Output Summary (Last 24 hours) at 03/01/14 1102 Last data filed at 03/01/14 0700  Gross per 24 hour  Intake   2125 ml  Output   1500 ml  Net    625 ml   Filed Weights   02/26/14 2039  Weight: 85.458 kg (188 lb 6.4 oz)    Exam:   General:  alert  Cardiovascular: s1,s2 rrr  Respiratory: CTA BL  Abdomen: soft, colostomy present; nephrostomy   Musculoskeletal: noLE edema   Data Reviewed: Basic Metabolic Panel:  Recent Labs Lab 02/26/14 1428 02/27/14 0628 02/28/14 0554 03/01/14 0554  NA 135* 135* 137 140  K 4.8 4.5 4.3 4.0  CL 99 98 99 101  CO2 24 24 23 21   GLUCOSE 116* 96 78 77  BUN 31* 32* 34* 30*  CREATININE 1.40* 1.50* 1.28 1.09  CALCIUM 9.6 9.2 9.1 9.2   Liver Function Tests:  Recent Labs Lab 02/26/14 1428  AST 70*  ALT 68*  ALKPHOS 58  BILITOT 0.7  PROT 8.3  ALBUMIN 3.4*    Recent Labs Lab 02/26/14 1428  LIPASE 25   No results found for this basename: AMMONIA,  in the last 168 hours CBC:  Recent Labs Lab 02/26/14 1428 02/27/14 0628 02/28/14 0554  WBC 5.7 4.1 3.9*  NEUTROABS 3.6  --  2.1  HGB 14.2 13.0 13.5  HCT 40.9 38.0* 39.2  MCV 93.0 95.0 94.5  PLT 213 174 182   Cardiac Enzymes: No results found for this basename: CKTOTAL, CKMB, CKMBINDEX, TROPONINI,  in the last 168 hours BNP (last 3 results) No results found for this basename: PROBNP,  in the last  8760 hours CBG: No results found for this basename: GLUCAP,  in the last 168 hours  Recent Results (from the past 240 hour(s))  URINE CULTURE     Status: None   Collection Time    02/26/14  4:17 PM      Result Value Ref Range Status   Specimen Description URINE, RANDOM   Final   Special Requests NONE   Final   Culture  Setup Time     Final   Value: 02/26/2014 22:17     Performed at [place]     Final   Value: >=100,000 COLONIES/ML     Performed at [facility]   Culture     Final   Value: [place]     Performed at  [facility]   Report Status PENDING   Incomplete     Studies: Dg Abd 2 Views  02/28/2014   CLINICAL DATA:  Small bowel obstruction.  EXAM: ABDOMEN - 2 VIEW  COMPARISON:  DG ABD 2 VIEWS dated 02/27/2014; CT ABD/PELVIS W CM dated 02/26/2014  FINDINGS: Right ureteral stent in stable position. NG tube noted projected over stomach. Mild basilar atelectasis. Progressive distention of small-bowel loops with small bowel loop diameter measuring up to 7 cm. Contrast is noted colon. Large left lower quadrant lateral abdominal wall hernia is present. No free air. Surgical clips and radiation seeds at the pelvis. Degenerative changes lumbar spine and both hips.  IMPRESSION: 1. Progressive distention of small bowel. Contrast is noted in the colon. These findings are consistent with worsening adynamic ileus. Partial small bowel obstruction cannot be excluded. 2. Right ureteral stent is in stable position. NG tube noted with tip projected over stomach .   Electronically Signed   By: Marcello Moores  Register   On: 02/28/2014 08:01   Dg Abd 2 Views  02/27/2014   CLINICAL DATA:  80 year old male with diffuse abdominal pain and distension. Recent small bowel obstruction. Initial encounter.  EXAM: ABDOMEN - 2 VIEW  COMPARISON:  CT Abdomen and Pelvis 02/26/2014.  FINDINGS: Right ureteral stent re- identified and grossly stable. Persistent gas-filled small bowel loops in the mid abdomen, measuring up to 67 mm diameter (50 mm yesterday). Nevertheless, oral contrast has reached the ascending colon since yesterday. Numerous surgical clips in the pelvis. On upright views, small pleural effusions are evident without pneumoperitoneum. Stable visualized osseous structures.  IMPRESSION: 1. Partial small bowel obstruction, with interval passage of oral contrast to the ascending colon, but persistently dilated small bowel loops up to 67 mm diameter. 2. No free air identified.  Small pleural effusions.   Electronically Signed   By: Lars Pinks  M.D.   On: 02/27/2014 12:15    Scheduled Meds: . cefTRIAXone (ROCEPHIN)  IV  1 g Intravenous Q24H  . enoxaparin (LOVENOX) injection  40 mg Subcutaneous Q24H  . tiotropium  18 mcg Inhalation Daily   Continuous Infusions: . sodium chloride 75 mL (02/28/14 1654)    Principal Problem:   Small bowel obstruction Active Problems:   Hydronephrosis, right status post right nephrostomy tube placement   H/O colostomy   CKD (chronic kidney disease) stage 3, GFR 30-59 ml/min   SBO (small bowel obstruction)    Time spent: >35 minutes     Kinnie Feil  [facility] Pager [phone]. If 7PM-7AM, please contact night-coverage at [email or web address], password [facility] 03/01/2014, 11:02 AM  LOS: 3 days

## 2014-03-01 NOTE — Progress Notes (Signed)
Subjective: The patient is about the same today, maybe feels slightly better.  Denies N/V.  433mL dark brown NG output.  Mobilizing some.  Denies any abd pain.     Objective: Vital signs in last 24 hours: Temp:  [98.3 F (36.8 C)-100.1 F (37.8 C)] 100.1 F (37.8 C) (04/03 0605) Pulse Rate:  [74-81] 74 (04/03 0605) Resp:  [16-18] 18 (04/03 0605) BP: (131-143)/(68-74) 143/74 mmHg (04/03 0605) SpO2:  [92 %-94 %] 92 % (04/03 0605) Last BM Date: 02/22/14  Intake/Output from previous day: Mar 05, 2023 0701 - 04/03 0700 In: 2125 [I.V.:1725; NG/GT:350; IV Piggyback:50] Out: 1500 [Urine:1050; Emesis/NG output:450] Intake/Output this shift:    PE: Gen:  Alert, NAD, pleasant Card:  RRR, no M/G/R heard Pulm:  CTA, no W/R/R Abd: Soft, mild lower abdominal tenderness, distended, few BS, numerous well healed abdominal scars, urostomy on right, colostomy on left with large parastomal hernia palpated which is soft and non-tender. Colostomy bag is otherwise empty, urostomy with urine output.    Lab Results:   Recent Labs  02/27/14 0628 03-04-14 0554  WBC 4.1 3.9*  HGB 13.0 13.5  HCT 38.0* 39.2  PLT 174 182   BMET  Recent Labs  Mar 04, 2014 0554 03/01/14 0554  NA 137 140  K 4.3 4.0  CL 99 101  CO2 23 21  GLUCOSE 78 77  BUN 34* 30*  CREATININE 1.28 1.09  CALCIUM 9.1 9.2   PT/INR No results found for this basename: LABPROT, INR,  in the last 72 hours CMP     Component Value Date/Time   NA 140 03/01/2014 0554   NA 144 08/09/2013 0813   K 4.0 03/01/2014 0554   CL 101 03/01/2014 0554   CO2 21 03/01/2014 0554   GLUCOSE 77 03/01/2014 0554   GLUCOSE 87 08/09/2013 0813   BUN 30* 03/01/2014 0554   BUN 18 08/09/2013 0813   CREATININE 1.09 03/01/2014 0554   CALCIUM 9.2 03/01/2014 0554   PROT 8.3 02/26/2014 1428   PROT 6.6 08/09/2013 0813   ALBUMIN 3.4* 02/26/2014 1428   AST 70* 02/26/2014 1428   ALT 68* 02/26/2014 1428   ALKPHOS 58 02/26/2014 1428   BILITOT 0.7 02/26/2014 1428   GFRNONAA 62* 03/01/2014  0554   GFRAA 71* 03/01/2014 0554   Lipase     Component Value Date/Time   LIPASE 25 02/26/2014 1428       Studies/Results: Dg Abd 2 Views  2014/03/04   CLINICAL DATA:  Small bowel obstruction.  EXAM: ABDOMEN - 2 VIEW  COMPARISON:  DG ABD 2 VIEWS dated 02/27/2014; CT ABD/PELVIS W CM dated 02/26/2014  FINDINGS: Right ureteral stent in stable position. NG tube noted projected over stomach. Mild basilar atelectasis. Progressive distention of small-bowel loops with small bowel loop diameter measuring up to 7 cm. Contrast is noted colon. Large left lower quadrant lateral abdominal wall hernia is present. No free air. Surgical clips and radiation seeds at the pelvis. Degenerative changes lumbar spine and both hips.  IMPRESSION: 1. Progressive distention of small bowel. Contrast is noted in the colon. These findings are consistent with worsening adynamic ileus. Partial small bowel obstruction cannot be excluded. 2. Right ureteral stent is in stable position. NG tube noted with tip projected over stomach .   Electronically Signed   By: Marcello Moores  Register   On: March 04, 2014 08:01   Dg Abd 2 Views  02/27/2014   CLINICAL DATA:  80 year old male with diffuse abdominal pain and distension. Recent small bowel obstruction. Initial encounter.  EXAM: ABDOMEN - 2 VIEW  COMPARISON:  CT Abdomen and Pelvis 02/26/2014.  FINDINGS: Right ureteral stent re- identified and grossly stable. Persistent gas-filled small bowel loops in the mid abdomen, measuring up to 67 mm diameter (50 mm yesterday). Nevertheless, oral contrast has reached the ascending colon since yesterday. Numerous surgical clips in the pelvis. On upright views, small pleural effusions are evident without pneumoperitoneum. Stable visualized osseous structures.  IMPRESSION: 1. Partial small bowel obstruction, with interval passage of oral contrast to the ascending colon, but persistently dilated small bowel loops up to 67 mm diameter. 2. No free air identified.  Small  pleural effusions.   Electronically Signed   By: Lars Pinks M.D.   On: 02/27/2014 12:15    Anti-infectives: Anti-infectives   Start     Dose/Rate Route Frequency Ordered Stop   02/27/14 1600  cefTRIAXone (ROCEPHIN) 1 g in dextrose 5 % 50 mL IVPB     1 g 100 mL/hr over 30 Minutes Intravenous Every 24 hours 02/26/14 2018     02/26/14 2000  cefTRIAXone (ROCEPHIN) 1 g in dextrose 5 % 50 mL IVPB  Status:  Discontinued     1 g 100 mL/hr over 30 Minutes Intravenous Every 24 hours 02/26/14 1950 02/26/14 2017   02/26/14 1630  cefTRIAXone (ROCEPHIN) 1 g in dextrose 5 % 50 mL IVPB     1 g 100 mL/hr over 30 Minutes Intravenous  Once 02/26/14 1615 02/26/14 1743       Assessment/Plan 1. SBO, recurrent  CT (02/26/14) evidence of source of obstruction in pelvis.  Likely due to adhesions for gastrointestinal and urologic surgeries.  Also has significant parastomal hernia which does not seem obstructed. Continue NPO, IVF, NG tube, pain control, antiemetics.  His KUB (02/28/14) shows worsening bowel distension up to 7cm.  At this point since his BO has actually worsened we recommend transfer to [facility] due to his past surgical history, complexity and risk of surgery.  However, the patient and his son are very adamant on staying here at [facility].  They feel more comfortable with the medical providers here and have a higher confidence in the medical treatment he has received here in the [facility] network compared to [place].  They had a "bad experience" at [facility] and a long hospital course last time they were there.    I reiterated to him today that the safest option for him would probably be transfer to [facility] for more specialized care.  He is refusing this.  He is still thinking about surgery here, but states that he was here last year with the same problem and that it finally resolved with NG decompression.  I suspect that if he had a surgery here, it would be extensive, and the risk of perioperative morbidity and even mortality  would be very high.    2. Ureteral stricture, chronic nephroureteral stenting  Has been followed by Dr. Mattie Marlin at [facility].  He is getting a stent replaced every 6 weeks.    3. Parastomal hernia at left colostomy.  This has been repaired at least once at [facility].  This is a large parastomal hernia, which is not tender and not the cause of his SBO  4. COPD  5. Hypercholesterolemia  6. History of prostate cancer - Had seed implant around 0737, but then complications from the seeds.  7. Rectouretheral fistula - S/p cystectomy, ileal conduit, ostomy - 2002 - [place]  8. History of coronary artery disease  9. 5.0 cm  AAA - monitored by Dr. Donnetta Hutching who does not recommend surgical intervention due to high risk 10. Mildly elevated creatinine - Cr. Improved since admission 11. History of psoriasis.     LOS: 3 days    Rosario Adie 05/02/8755, 4:33 AM Pager: [phone]

## 2014-03-02 ENCOUNTER — Inpatient Hospital Stay (HOSPITAL_COMMUNITY): Payer: Medicare Other

## 2014-03-02 LAB — URINE CULTURE: Colony Count: >=100000

## 2014-03-02 MED ORDER — DEXTROSE-NACL 5-0.9 % IV SOLN
INTRAVENOUS | Status: DC
  Administered 2014-03-02: 09:00:00 via INTRAVENOUS

## 2014-03-02 NOTE — Progress Notes (Signed)
Pt requesting his Neurontin (300mg  TID) and his Wellbutrin BID (unsure of dose). MD, please advise.

## 2014-03-02 NOTE — Progress Notes (Signed)
Subjective: The patient feels better.  Denies N/V.  2039mL brown NG output.  Mobilizing some.  Denies any abd pain.     Objective: Vital signs in last 24 hours: Temp:  [98 F (36.7 C)-98.6 F (37 C)] 98.5 F (36.9 C) (04/04 0537) Pulse Rate:  [63-71] 63 (04/04 0537) Resp:  [16-20] 20 (04/04 0537) BP: (125-142)/(60-81) 125/66 mmHg (04/04 0537) SpO2:  [93 %-94 %] 94 % (04/04 0537) Last BM Date: 02/22/14  Intake/Output from previous day: 04/03 0701 - 04/04 0700 In: 1875 [I.V.:1875] Out: 2500 [Urine:450; Emesis/NG output:2050] Intake/Output this shift:    PE: Gen:  Alert, NAD, pleasant Card:  RRR, no M/G/R heard Pulm:  CTA, no W/R/R Abd: Soft, mild lower abdominal tenderness, less distended, few BS, numerous well healed abdominal scars, urostomy on right, colostomy on left with large parastomal hernia palpated which is soft and non-tender. Colostomy bag is otherwise empty, urostomy with urine output.    Lab Results:   Recent Labs  02/28/14 0554  WBC 3.9*  HGB 13.5  HCT 39.2  PLT 182   BMET  Recent Labs  02/28/14 0554 03/01/14 0554  NA 137 140  K 4.3 4.0  CL 99 101  CO2 23 21  GLUCOSE 78 77  BUN 34* 30*  CREATININE 1.28 1.09  CALCIUM 9.1 9.2   PT/INR No results found for this basename: LABPROT, INR,  in the last 72 hours CMP     Component Value Date/Time   NA 140 03/01/2014 0554   NA 144 08/09/2013 0813   K 4.0 03/01/2014 0554   CL 101 03/01/2014 0554   CO2 21 03/01/2014 0554   GLUCOSE 77 03/01/2014 0554   GLUCOSE 87 08/09/2013 0813   BUN 30* 03/01/2014 0554   BUN 18 08/09/2013 0813   CREATININE 1.09 03/01/2014 0554   CALCIUM 9.2 03/01/2014 0554   PROT 8.3 02/26/2014 1428   PROT 6.6 08/09/2013 0813   ALBUMIN 3.4* 02/26/2014 1428   AST 70* 02/26/2014 1428   ALT 68* 02/26/2014 1428   ALKPHOS 58 02/26/2014 1428   BILITOT 0.7 02/26/2014 1428   GFRNONAA 62* 03/01/2014 0554   GFRAA 71* 03/01/2014 0554   Lipase     Component Value Date/Time   LIPASE 25 02/26/2014 1428        Studies/Results: Dg Abd Portable 2v  03/02/2014   CLINICAL DATA:  Followup small bowel obstruction  EXAM: PORTABLE ABDOMEN - 2 VIEW  COMPARISON:  02/28/2014  FINDINGS: Contrast material remains in the colon. The degree of small bowel dilatation has improved somewhat in the interval from the prior exam. A nasogastric catheter and right-sided retrograde nephrostomy catheter remain. No free air is seen. Vascular calcifications are noted.  IMPRESSION: Continued improved appearance of the bowel gas pattern. No obstructive changes are noted.   Electronically Signed   By: Inez Catalina M.D.   On: 03/02/2014 07:34    Anti-infectives: Anti-infectives   Start     Dose/Rate Route Frequency Ordered Stop   02/27/14 1600  cefTRIAXone (ROCEPHIN) 1 g in dextrose 5 % 50 mL IVPB     1 g 100 mL/hr over 30 Minutes Intravenous Every 24 hours 02/26/14 2018     02/26/14 2000  cefTRIAXone (ROCEPHIN) 1 g in dextrose 5 % 50 mL IVPB  Status:  Discontinued     1 g 100 mL/hr over 30 Minutes Intravenous Every 24 hours 02/26/14 1950 02/26/14 2017   02/26/14 1630  cefTRIAXone (ROCEPHIN) 1 g in dextrose 5 % 50  mL IVPB     1 g 100 mL/hr over 30 Minutes Intravenous  Once 02/26/14 1615 02/26/14 1743       Assessment/Plan 1. SBO, recurrent  CT (02/26/14) evidence of source of obstruction in pelvis.  Likely due to adhesions for gastrointestinal and urologic surgeries.  Also has significant parastomal hernia which does not seem obstructed. Continue NPO, IVF, NG tube, pain control, antiemetics.  His KUB (02/28/14) shows worsening bowel distension up to 7cm.  At this point since his BO has actually worsened we recommend transfer to [facility] due to his past surgical history, complexity and risk of surgery.  However, the patient and his son are very adamant on staying here at [facility].  They feel more comfortable with the medical providers here and have a higher confidence in the medical treatment he has received here in the [facility]  network compared to [place].  They had a "bad experience" at [facility] and a long hospital course last time they were there.    Currently his AXR looks better and it appears he now has contrast moving into his ascending colon.  NG drained well yesterday.  Cont NPO NG today.  Will have RN staff ambulate pt more today.  May possibly resolve with medical management  2. Ureteral stricture, chronic nephroureteral stenting  Has been followed by Dr. Mattie Marlin at [facility].  He is getting a stent replaced every 6 weeks.    3. Parastomal hernia at left colostomy.  This has been repaired at least once at [facility].  This is a large parastomal hernia, which is not tender and not the cause of his SBO  4. COPD  5. Hypercholesterolemia  6. History of prostate cancer - Had seed implant around 2947, but then complications from the seeds.  7. Rectouretheral fistula - S/p cystectomy, ileal conduit, ostomy - 2002 - [place]  8. History of coronary artery disease  9. 5.0 cm AAA - monitored by Dr. Donnetta Hutching who does not recommend surgical intervention due to high risk 10. Mildly elevated creatinine - Cr. Improved since admission 11. History of psoriasis.     LOS: 4 days    Michael Novak 05/03/4649, 3:54 AM Pager: [phone]

## 2014-03-02 NOTE — Progress Notes (Signed)
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 Recurrent small bowel obstruction  -CT category as this is a high-grade obstruction with a transition point.   -? improving based on xray; NPO, IVF; NG; patient declined surgery; d/w patient, family they understand teh risk of perforation, death  -defer management to surgery, appreciate the input  2. History of hydronephrosis status post right nephrostomy tube placement; h/o cystoprostatectomy  -This is exchanged every 6 weeks, not due until mid-May.  -probable UTI; started on IV atx; pend cultures  3. Chronic kidney disease stage III  -Creatinine at baseline of 1.4-1.6. 4. COPD stable; cont home regimen   D/w patient confirmed in the presence of his son Michael Novak, Michael Novak [phone] -he is DNR  Code Status: full Family Communication: d/w  Patient, updated his wife (indicate person spoken with, relationship, and if by phone, the number) Disposition Plan: home pend clinical improvement    Consultants:  surgery  Procedures:  None   Antibiotics:  Ceftriaxone 4/1<<<<   (indicate start date, and stop date if known)  HPI/Subjective: alert  Objective: Filed Vitals:   03/02/14 0537  BP: 125/66  Pulse: 63  Temp: 98.5 F  (36.9 C)  Resp: 20    Intake/Output Summary (Last 24 hours) at 03/02/14 0820 Last data filed at 03/02/14 0700  Gross per 24 hour  Intake   1875 ml  Output   2500 ml  Net   -625 ml   Filed Weights   02/26/14 2039  Weight: 85.458 kg (188 lb 6.4 oz)    Exam:   General:  alert  Cardiovascular: s1,s2 rrr  Respiratory: CTA BL  Abdomen: soft, colostomy present; nephrostomy   Musculoskeletal: noLE edema   Data Reviewed: Basic Metabolic Panel:  Recent Labs Lab 02/26/14 1428 02/27/14 0628 02/28/14 0554 03/01/14 0554  NA 135* 135* 137 140  K 4.8 4.5 4.3 4.0  CL 99 98 99 101  CO2 24 24 23 21   GLUCOSE 116* 96 78 77  BUN 31* 32* 34* 30*  CREATININE 1.40* 1.50* 1.28 1.09  CALCIUM 9.6 9.2 9.1 9.2   Liver Function Tests:  Recent Labs Lab 02/26/14 1428  AST 70*  ALT 68*  ALKPHOS 58  BILITOT 0.7  PROT 8.3  ALBUMIN 3.4*    Recent Labs Lab 02/26/14 1428  LIPASE 25   No results found for this basename: AMMONIA,  in the last 168 hours CBC:  Recent Labs Lab 02/26/14 1428 02/27/14 0628 02/28/14 0554  WBC 5.7 4.1 3.9*  NEUTROABS 3.6  --  2.1  HGB 14.2 13.0 13.5  HCT 40.9 38.0* 39.2  MCV 93.0 95.0 94.5  PLT 213 174 182   Cardiac Enzymes: No results found for this basename: CKTOTAL, CKMB, CKMBINDEX, TROPONINI,  in the last 168 hours BNP (last 3 results) No results found for this basename: PROBNP,  in the last 8760 hours CBG: No results found for this basename: GLUCAP,  in the last 168 hours  Recent Results (from the past 240 hour(s))  URINE CULTURE     Status: None   Collection Time    02/26/14  4:17 PM      Result Value Ref Range Status   Specimen Description URINE, RANDOM   Final   Special Requests NONE   Final   Culture  Setup Time     Final   Value: 02/26/2014 22:17     Performed at [place]     Final   Value: >=100,000 COLONIES/ML     Performed at [facility]   Culture     Final   Value: [place]     Performed at [facility]   Report Status PENDING   Incomplete     Studies: Dg Abd Portable 2v  03/02/2014   CLINICAL DATA:  Followup small bowel obstruction  EXAM: PORTABLE ABDOMEN - 2 VIEW  COMPARISON:  02/28/2014  FINDINGS: Contrast material remains in the colon. The degree of small bowel dilatation has improved somewhat in the interval from the prior exam. A nasogastric catheter and right-sided retrograde nephrostomy catheter remain. No free air is seen. Vascular calcifications are noted.  IMPRESSION: Continued improved appearance of the bowel gas pattern. No obstructive changes are noted.   Electronically Signed   By: Inez Catalina M.D.   On: 03/02/2014 07:34    Scheduled Meds: . cefTRIAXone (ROCEPHIN)  IV  1 g Intravenous Q24H  . enoxaparin (LOVENOX) injection  40 mg Subcutaneous Q24H  . tiotropium  18 mcg Inhalation Daily   Continuous Infusions: . sodium chloride 75 mL/hr at 03/02/14 0600    Principal Problem:   Small bowel obstruction Active Problems:   Hydronephrosis, right status post right nephrostomy tube placement   H/O colostomy   CKD (chronic kidney disease) stage 3, GFR 30-59 ml/min   SBO (small bowel obstruction)    Time spent: >35 minutes     Kinnie Feil  [facility] Pager [phone]. If 7PM-7AM, please contact night-coverage at [email or web address], password [facility] 03/02/2014, 8:20 AM  LOS: 4 days

## 2014-03-03 LAB — BASIC METABOLIC PANEL
BUN: 24 mg/dL — ABNORMAL HIGH (ref 6–23)
CO2: 25 mEq/L (ref 19–32)
Calcium: 8.9 mg/dL (ref 8.4–10.5)
Chloride: 108 mEq/L (ref 96–112)
Creatinine, Ser: 1.03 mg/dL (ref 0.50–1.35)
GFR calc Af Amer: 77 mL/min — ABNORMAL LOW (ref >90)
GFR calc non Af Amer: 66 mL/min — ABNORMAL LOW (ref >90)
Glucose, Bld: 130 mg/dL — ABNORMAL HIGH (ref 70–99)
POTASSIUM: 3.3 meq/L — AB (ref 3.7–5.3)
Sodium: 144 mEq/L (ref 137–147)

## 2014-03-03 LAB — CBC
HCT: 36.4 % — ABNORMAL LOW (ref 39.0–52.0)
HEMOGLOBIN: 12.5 g/dL — AB (ref 13.0–17.0)
MCH: 31.8 pg (ref 26.0–34.0)
MCHC: 34.3 g/dL (ref 30.0–36.0)
MCV: 92.6 fL (ref 78.0–100.0)
Platelets: 195 10*3/uL (ref 150–400)
RBC: 3.93 MIL/uL — ABNORMAL LOW (ref 4.22–5.81)
RDW: 13.9 % (ref 11.5–15.5)
WBC: 6.2 10*3/uL (ref 4.0–10.5)

## 2014-03-03 MED ORDER — POTASSIUM CHLORIDE 10 MEQ/100ML IV SOLN
10.0000 meq | INTRAVENOUS | Status: AC
  Administered 2014-03-03 (×4): 10 meq via INTRAVENOUS
  Filled 2014-03-03 (×4): qty 100

## 2014-03-03 MED ORDER — KCL IN DEXTROSE-NACL 20-5-0.45 MEQ/L-%-% IV SOLN
INTRAVENOUS | Status: DC
  Administered 2014-03-03 – 2014-03-04 (×2): via INTRAVENOUS
  Administered 2014-03-05: 75 mL via INTRAVENOUS
  Administered 2014-03-06: 19:00:00 via INTRAVENOUS
  Filled 2014-03-03 (×9): qty 1000

## 2014-03-03 NOTE — Progress Notes (Signed)
Subjective: The patient feels ok.  Denies N/V.  1512mL brown NG output.  Mobilizing some.  Denies any abd pain.     Objective: Vital signs in last 24 hours: Temp:  [98.2 F (36.8 C)-98.7 F (37.1 C)] 98.6 F (37 C) (04/05 0525) Pulse Rate:  [60-80] 60 (04/05 0525) Resp:  [18] 18 (04/05 0525) BP: (124-140)/(61-67) 124/66 mmHg (04/05 0525) SpO2:  [92 %-95 %] 94 % (04/05 0525) Last BM Date: 02/22/14  Intake/Output from previous day: 04/04 0701 - 04/05 0700 In: 1725 [I.V.:1725] Out: 8182 [Urine:1950; Emesis/NG output:1525] Intake/Output this shift:    PE: Gen:  Alert, NAD, pleasant Card:  RRR, no M/G/R heard Pulm:  CTA, no W/R/R Abd: Soft, mild lower abdominal tenderness, less distended, few BS, numerous well healed abdominal scars, urostomy on right, colostomy on left with large parastomal hernia palpated which is soft and non-tender. Colostomy bag is otherwise empty, urostomy with urine output.    Lab Results:   Recent Labs  03/03/14 0507  WBC 6.2  HGB 12.5*  HCT 36.4*  PLT 195   BMET  Recent Labs  03/01/14 0554 03/03/14 0507  NA 140 144  K 4.0 3.3*  CL 101 108  CO2 21 25  GLUCOSE 77 130*  BUN 30* 24*  CREATININE 1.09 1.03  CALCIUM 9.2 8.9   PT/INR No results found for this basename: LABPROT, INR,  in the last 72 hours CMP     Component Value Date/Time   NA 144 03/03/2014 0507   NA 144 08/09/2013 0813   K 3.3* 03/03/2014 0507   CL 108 03/03/2014 0507   CO2 25 03/03/2014 0507   GLUCOSE 130* 03/03/2014 0507   GLUCOSE 87 08/09/2013 0813   BUN 24* 03/03/2014 0507   BUN 18 08/09/2013 0813   CREATININE 1.03 03/03/2014 0507   CALCIUM 8.9 03/03/2014 0507   PROT 8.3 02/26/2014 1428   PROT 6.6 08/09/2013 0813   ALBUMIN 3.4* 02/26/2014 1428   AST 70* 02/26/2014 1428   ALT 68* 02/26/2014 1428   ALKPHOS 58 02/26/2014 1428   BILITOT 0.7 02/26/2014 1428   GFRNONAA 66* 03/03/2014 0507   GFRAA 77* 03/03/2014 0507   Lipase     Component Value Date/Time   LIPASE 25 02/26/2014 1428        Studies/Results: Dg Abd Portable 2v  03/02/2014   CLINICAL DATA:  Followup small bowel obstruction  EXAM: PORTABLE ABDOMEN - 2 VIEW  COMPARISON:  02/28/2014  FINDINGS: Contrast material remains in the colon. The degree of small bowel dilatation has improved somewhat in the interval from the prior exam. A nasogastric catheter and right-sided retrograde nephrostomy catheter remain. No free air is seen. Vascular calcifications are noted.  IMPRESSION: Continued improved appearance of the bowel gas pattern. No obstructive changes are noted.   Electronically Signed   By: Inez Catalina M.D.   On: 03/02/2014 07:34    Anti-infectives: Anti-infectives   Start     Dose/Rate Route Frequency Ordered Stop   02/27/14 1600  cefTRIAXone (ROCEPHIN) 1 g in dextrose 5 % 50 mL IVPB     1 g 100 mL/hr over 30 Minutes Intravenous Every 24 hours 02/26/14 2018     02/26/14 2000  cefTRIAXone (ROCEPHIN) 1 g in dextrose 5 % 50 mL IVPB  Status:  Discontinued     1 g 100 mL/hr over 30 Minutes Intravenous Every 24 hours 02/26/14 1950 02/26/14 2017   02/26/14 1630  cefTRIAXone (ROCEPHIN) 1 g in dextrose 5 % 50  mL IVPB     1 g 100 mL/hr over 30 Minutes Intravenous  Once 02/26/14 1615 02/26/14 1743       Assessment/Plan 1. SBO, recurrent  CT (02/26/14) evidence of source of obstruction in pelvis.  Likely due to adhesions for gastrointestinal and urologic surgeries.  Also has significant parastomal hernia which does not seem obstructed. Continue NPO, IVF, NG tube, pain control, antiemetics.  His KUB (02/28/14) shows worsening bowel distension up to 7cm.  At this point since his BO has actually worsened we recommend transfer to [facility] due to his past surgical history, complexity and risk of surgery.  However, the patient and his son are very adamant on staying here at [facility].  They feel more comfortable with the medical providers here and have a higher confidence in the medical treatment he has received here in the [facility]  network compared to [place].  They had a "bad experience" at [facility] and a long hospital course last time they were there.    Currently his AXR looks better and it appears he now has contrast moving into his ascending colon.  NG drained well yesterday with less output than the day before.  Cont NPO, NG today. Ambulate pt more today.  May possibly resolve with medical management.  2. Ureteral stricture, chronic nephroureteral stenting  Has been followed by Dr. Mattie Marlin at [facility].  He is getting a stent replaced every 6 weeks.    3. Parastomal hernia at left colostomy.  This has been repaired at least once at [facility].  This is a large parastomal hernia, which is not tender and not the cause of his SBO  4. COPD  5. Hypercholesterolemia  6. History of prostate cancer - Had seed implant around 9702, but then complications from the seeds.  7. Rectouretheral fistula - S/p cystectomy, ileal conduit, ostomy - 2002 - [facility]  8. History of coronary artery disease  9. 5.0 cm AAA - monitored by Dr. Donnetta Hutching who does not recommend surgical intervention due to high risk 10. Mildly elevated creatinine - Cr. Improved since admission 11. History of psoriasis. 12. Hypokalemia: replace, recheck in AM     LOS: 5 days    Rosario Adie. 05/01/7857, 8:50 AM Pager: [phone]

## 2014-03-03 NOTE — Progress Notes (Signed)
[facility] PROGRESS NOTE  Michael Novak JJH:[id number] DOB: November 25, 1930 DOA: 02/26/2014 PCP: Estill Dooms, MD  Assessment/Plan: Principal Problem:  Small bowel obstruction  Active Problems:  Hydronephrosis, right status post right nephrostomy tube placement  H/O colostomy  CKD (chronic kidney disease) stage 3, GFR 30-59 ml/min  SBO (small bowel obstruction)   80 y/o male with PMH of COPD, prostate cancer status post radioactive seed implantation complicated by prostatic rectal fistula status post cystoprostatectomy with ileal conduit as well as proctocolectomy with colostomy as well as right-sided nephrostomy tube placed in July of 2013 and exchanged every 6 weeks presented with  no colostomy output is admitted with bowel obstruction   1. Recurrent small bowel obstruction  -CT category as this is a high-grade obstruction with a transition point.   -? improving based on xray; NPO, IVF/replace lytes; NG; patient declined surgery; d/w patient, family they understand the risk of perforation, death  -defer management to surgery, appreciate the input  2. History of hydronephrosis status post right nephrostomy tube placement; h/o cystoprostatectomy  -This is exchanged every 6 weeks, not due until mid-May.  -probable UTI; started on IV atx; cultures E coli, but patient if afebrile, no leukocytosis; d/c atx; ? colonization   3. Chronic kidney disease stage III  -Creatinine at baseline of 1.4-1.6. 4. COPD stable; cont home regimen   D/w patient confirmed in the presence of his son Adeyemi, [place] [phone] -he is DNR  Code Status: full Family Communication: d/w  Patient, updated his wife (indicate person spoken with, relationship, and if by phone, the number) Disposition Plan: home pend clinical improvement    Consultants:  surgery  Procedures:  None   Antibiotics:  Ceftriaxone 4/1<<<<   (indicate start date, and stop date if  known)  HPI/Subjective: alert  Objective: Filed Vitals:   03/03/14 0525  BP: 124/66  Pulse: 60  Temp: 98.6 F (37 C)  Resp: 18    Intake/Output Summary (Last 24 hours) at 03/03/14 1105 Last data filed at 03/03/14 0612  Gross per 24 hour  Intake   1725 ml  Output   3475 ml  Net  -1750 ml   Filed Weights   02/26/14 2039  Weight: 85.458 kg (188 lb 6.4 oz)    Exam:   General:  alert  Cardiovascular: s1,s2 rrr  Respiratory: CTA BL  Abdomen: soft, colostomy present; nephrostomy   Musculoskeletal: noLE edema   Data Reviewed: Basic Metabolic Panel:  Recent Labs Lab 02/26/14 1428 02/27/14 0628 02/28/14 0554 03/01/14 0554 03/03/14 0507  NA 135* 135* 137 140 144  K 4.8 4.5 4.3 4.0 3.3*  CL 99 98 99 101 108  CO2 24 24 23 21 25   GLUCOSE 116* 96 78 77 130*  BUN 31* 32* 34* 30* 24*  CREATININE 1.40* 1.50* 1.28 1.09 1.03  CALCIUM 9.6 9.2 9.1 9.2 8.9   Liver Function Tests:  Recent Labs Lab 02/26/14 1428  AST 70*  ALT 68*  ALKPHOS 58  BILITOT 0.7  PROT 8.3  ALBUMIN 3.4*    Recent Labs Lab 02/26/14 1428  LIPASE 25   No results found for this basename: AMMONIA,  in the last 168 hours CBC:  Recent Labs Lab 02/26/14 1428 02/27/14 0628 02/28/14 0554 03/03/14 0507  WBC 5.7 4.1 3.9* 6.2  NEUTROABS 3.6  --  2.1  --   HGB 14.2 13.0 13.5 12.5*  HCT 40.9 38.0* 39.2 36.4*  MCV 93.0 95.0 94.5 92.6  PLT 213 174 182 195  Cardiac Enzymes: No results found for this basename: CKTOTAL, CKMB, CKMBINDEX, TROPONINI,  in the last 168 hours BNP (last 3 results) No results found for this basename: PROBNP,  in the last 8760 hours CBG: No results found for this basename: GLUCAP,  in the last 168 hours  Recent Results (from the past 240 hour(s))  URINE CULTURE     Status: None   Collection Time    02/26/14  4:17 PM      Result Value Ref Range Status   Specimen Description URINE, RANDOM   Final   Special Requests NONE   Final   Culture  Setup Time      Final   Value: 02/26/2014 22:17     Performed at [place]     Final   Value: >=100,000 COLONIES/ML     Performed at [facility]   Culture     Final   Value: ESCHERICHIA COLI     Note: Two isolates with different morphologies were identified as the same organism.The most resistant organism was reported.     Performed at [facility]   Report Status 03/02/2014 FINAL   Final   Organism ID, Bacteria ESCHERICHIA COLI   Final     Studies: Dg Abd Portable 2v  03/02/2014   CLINICAL DATA:  Followup small bowel obstruction  EXAM: PORTABLE ABDOMEN - 2 VIEW  COMPARISON:  02/28/2014  FINDINGS: Contrast material remains in the colon. The degree of small bowel dilatation has improved somewhat in the interval from the prior exam. A nasogastric catheter and right-sided retrograde nephrostomy catheter remain. No free air is seen. Vascular calcifications are noted.  IMPRESSION: Continued improved appearance of the bowel gas pattern. No obstructive changes are noted.   Electronically Signed   By: Inez Catalina M.D.   On: 03/02/2014 07:34    Scheduled Meds: . cefTRIAXone (ROCEPHIN)  IV  1 g Intravenous Q24H  . enoxaparin (LOVENOX) injection  40 mg Subcutaneous Q24H  . potassium chloride  10 mEq Intravenous Q1 Hr x 4  . tiotropium  18 mcg Inhalation Daily   Continuous Infusions: . dextrose 5 % and 0.45 % NaCl with KCl 20 mEq/L 75 mL/hr at 03/03/14 2423    Principal Problem:   Small bowel obstruction Active Problems:   Hydronephrosis, right status post right nephrostomy tube placement   H/O colostomy   CKD (chronic kidney disease) stage 3, GFR 30-59 ml/min   SBO (small bowel obstruction)    Time spent: >35 minutes     Kinnie Feil  [facility] Pager [phone]. If 7PM-7AM, please contact night-coverage at [email or web address], password [facility] 03/03/2014, 11:05 AM  LOS: 5 days

## 2014-03-03 NOTE — Evaluation (Signed)
Physical Therapy Evaluation Patient Details Name: Michael Novak MRN: [id number] DOB: 1930-04-25 Today's Date: 03/03/2014   History of Present Illness  80 yo male admitted with SBO. Hx of  COPD, depression, MI, prostate cancer, skin cancer, neuropathy, colostomy  Clinical Impression  On eval, pt was supervision level for mobility-able to ambulate ~600 feet with walker. Tolerated well. Pt has also been walking with nursing supervision-encouraged pt to continue with this. At this time, do not anticipate any follow up PT needs at discharge.     Follow Up Recommendations No PT follow up;Supervision - Intermittent    Equipment Recommendations  None recommended by PT    Recommendations for Other Services       Precautions / Restrictions Precautions Precaution Comments: NG tube Restrictions Weight Bearing Restrictions: No      Mobility  Bed Mobility Overal bed mobility: Modified Independent                Transfers Overall transfer level: Modified independent                  Ambulation/Gait Ambulation/Gait assistance: Supervision Ambulation Distance (Feet): 600 Feet Assistive device: Rolling walker (2 wheeled) Gait Pattern/deviations: WFL(Within Functional Limits)     General Gait Details: slow gait speed. tolerated well  Stairs            Wheelchair Mobility    Modified Rankin (Stroke Patients Only)       Balance                                             Pertinent Vitals/Pain No c/o pain    Home Living Family/patient expects to be discharged to:: Private residence Living Arrangements: Spouse/significant other   Type of Home: House Home Access: Level entry     Home Layout: One level Home Equipment: Environmental consultant - 4 wheels      Prior Function Level of Independence: Independent               Hand Dominance        Extremity/Trunk Assessment   Upper Extremity Assessment: Overall WFL for tasks assessed           Lower Extremity Assessment: Generalized weakness      Cervical / Trunk Assessment: Normal  Communication   Communication: No difficulties  Cognition Arousal/Alertness: Awake/alert Behavior During Therapy: WFL for tasks assessed/performed Overall Cognitive Status: Within Functional Limits for tasks assessed                      General Comments      Exercises        Assessment/Plan    PT Assessment Patient needs continued PT services  PT Diagnosis Difficulty walking;Generalized weakness   PT Problem List Decreased mobility;Decreased activity tolerance  PT Treatment Interventions Gait training;Functional mobility training;Therapeutic activities;Therapeutic exercise;Patient/family education   PT Goals (Current goals can be found in the Care Plan section) Acute Rehab PT Goals Patient Stated Goal: to get better.  PT Goal Formulation: With patient/family Time For Goal Achievement: 03/17/14 Potential to Achieve Goals: Good    Frequency Min 2X/week   Barriers to discharge        Co-evaluation               End of Session   Activity Tolerance: Patient tolerated treatment well Patient left: in chair;with call  bell/phone within reach;with family/visitor present Pt requested ice/water: verified with RN Abigail Butts) who states small amount of ice/water is okay for pt to sip-made pt aware of this as well.            Time: 0957-1020 PT Time Calculation (min): 23 min   Charges:   PT Evaluation $Initial PT Evaluation Tier I: 1 Procedure PT Treatments $Gait Training: 8-22 mins   PT G Codes:          Weston Anna, MPT Pager: [phone]

## 2014-03-04 ENCOUNTER — Inpatient Hospital Stay (HOSPITAL_COMMUNITY): Payer: Medicare Other

## 2014-03-04 LAB — BASIC METABOLIC PANEL
BUN: 22 mg/dL (ref 6–23)
CALCIUM: 9.3 mg/dL (ref 8.4–10.5)
CO2: 26 mEq/L (ref 19–32)
Chloride: 107 mEq/L (ref 96–112)
Creatinine, Ser: 1.07 mg/dL (ref 0.50–1.35)
GFR calc Af Amer: 73 mL/min — ABNORMAL LOW (ref >90)
GFR, EST NON AFRICAN AMERICAN: 63 mL/min — AB (ref >90)
GLUCOSE: 124 mg/dL — AB (ref 70–99)
Potassium: 3.6 mEq/L — ABNORMAL LOW (ref 3.7–5.3)
Sodium: 144 mEq/L (ref 137–147)

## 2014-03-04 NOTE — Progress Notes (Addendum)
[facility] PROGRESS NOTE  Michael Novak ERX:[id number] DOB: [birth year] DOA: 02/26/2014 PCP: Estill Dooms, MD  Assessment/Plan: Principal Problem:  Small bowel obstruction  Active Problems:  Hydronephrosis, right status post right nephrostomy tube placement  H/O colostomy  CKD (chronic kidney disease) stage 3, GFR 30-59 ml/min  SBO (small bowel obstruction)   80 y/o male with PMH of COPD, prostate cancer status post radioactive seed implantation complicated by prostatic rectal fistula status post cystoprostatectomy with ileal conduit as well as proctocolectomy with colostomy as well as right-sided nephrostomy tube placed in July of 2013 and exchanged every 6 weeks presented with  no colostomy output is admitted with bowel obstruction   1. Recurrent small bowel obstruction  -CT category as this is a high-grade obstruction with a transition point.   -?slowly  Improving; pend x ray; NPO, IVF/replace lytes; NG; patient declined surgery; d/w patient, family they understand the risk of perforation, death  -defer management to surgery, appreciate the input; diet per surgery   2. History of hydronephrosis status post right nephrostomy tube placement; h/o cystoprostatectomy  -This is exchanged every 6 weeks, not due until mid-May.  -probable UTI; started on IV atx; cultures E coli, but patient if afebrile, no leukocytosis; d/c atx; ? colonization   3. Chronic kidney disease stage III  -Creatinine at baseline of 1.4-1.6. 4. COPD stable; cont home regimen   D/w patient confirmed in the presence of his son Michael, Novak [phone] -he is DNR  Code Status: full Family Communication: d/w  Patient, updated his wife (indicate person spoken with, relationship, and if by phone, the number) Disposition Plan: home pend clinical improvement    Consultants:  surgery  Procedures:  None   Antibiotics:  Ceftriaxone 4/1<<<<   (indicate start date, and stop date if  known)  HPI/Subjective: alert  Objective: Filed Vitals:   03/04/14 0543  BP: 125/70  Pulse: 62  Temp: 98.2 F (36.8 C)  Resp: 16    Intake/Output Summary (Last 24 hours) at 03/04/14 1024 Last data filed at 03/04/14 0700  Gross per 24 hour  Intake   1860 ml  Output   2550 ml  Net   -690 ml   Filed Weights   02/26/14 2039  Weight: 85.458 kg (188 lb 6.4 oz)    Exam:   General:  alert  Cardiovascular: s1,s2 rrr  Respiratory: CTA BL  Abdomen: soft, colostomy present; nephrostomy   Musculoskeletal: noLE edema   Data Reviewed: Basic Metabolic Panel:  Recent Labs Lab 02/27/14 0628 02/28/14 0554 03/01/14 0554 03/03/14 0507 03/04/14 0533  NA 135* 137 140 144 144  K 4.5 4.3 4.0 3.3* 3.6*  CL 98 99 101 108 107  CO2 24 23 21 25 26   GLUCOSE 96 78 77 130* 124*  BUN 32* 34* 30* 24* 22  CREATININE 1.50* 1.28 1.09 1.03 1.07  CALCIUM 9.2 9.1 9.2 8.9 9.3   Liver Function Tests:  Recent Labs Lab 02/26/14 1428  AST 70*  ALT 68*  ALKPHOS 58  BILITOT 0.7  PROT 8.3  ALBUMIN 3.4*    Recent Labs Lab 02/26/14 1428  LIPASE 25   No results found for this basename: AMMONIA,  in the last 168 hours CBC:  Recent Labs Lab 02/26/14 1428 02/27/14 0628 02/28/14 0554 03/03/14 0507  WBC 5.7 4.1 3.9* 6.2  NEUTROABS 3.6  --  2.1  --   HGB 14.2 13.0 13.5 12.5*  HCT 40.9 38.0* 39.2 36.4*  MCV 93.0 95.0 94.5 92.6  PLT 213 174 182 195   Cardiac Enzymes: No results found for this basename: CKTOTAL, CKMB, CKMBINDEX, TROPONINI,  in the last 168 hours BNP (last 3 results) No results found for this basename: PROBNP,  in the last 8760 hours CBG: No results found for this basename: GLUCAP,  in the last 168 hours  Recent Results (from the past 240 hour(s))  URINE CULTURE     Status: None   Collection Time    02/26/14  4:17 PM      Result Value Ref Range Status   Specimen Description URINE, RANDOM   Final   Special Requests NONE   Final   Culture  Setup Time      Final   Value: 02/26/2014 22:17     Performed at [place]     Final   Value: >=100,000 COLONIES/ML     Performed at [facility]   Culture     Final   Value: ESCHERICHIA COLI     Note: Two isolates with different morphologies were identified as the same organism.The most resistant organism was reported.     Performed at [facility]   Report Status 03/02/2014 FINAL   Final   Organism ID, Bacteria ESCHERICHIA COLI   Final     Studies: No results found.  Scheduled Meds: . enoxaparin (LOVENOX) injection  40 mg Subcutaneous Q24H  . tiotropium  18 mcg Inhalation Daily   Continuous Infusions: . dextrose 5 % and 0.45 % NaCl with KCl 20 mEq/L 75 mL/hr at 03/04/14 8676    Principal Problem:   Small bowel obstruction Active Problems:   Hydronephrosis, right status post right nephrostomy tube placement   H/O colostomy   CKD (chronic kidney disease) stage 3, GFR 30-59 ml/min   SBO (small bowel obstruction)    Time spent: >35 minutes     Kinnie Feil  [facility] Pager [phone]. If 7PM-7AM, please contact night-coverage at [email or web address], password [facility] 03/04/2014, 10:24 AM  LOS: 6 days

## 2014-03-04 NOTE — Progress Notes (Signed)
Subjective: Pt feels better.  No pain N/V.  NG output high.  Nurses noted it was bloody this weekend, but is not now.  Ambulating well through the halls.    Objective: Vital signs in last 24 hours: Temp:  [98.2 F (36.8 C)-98.4 F (36.9 C)] 98.2 F (36.8 C) (04/06 0543) Pulse Rate:  [54-62] 62 (04/06 0543) Resp:  [16] 16 (04/06 0543) BP: (122-135)/(64-70) 125/70 mmHg (04/06 0543) SpO2:  [90 %-96 %] 93 % (04/06 0543) Last BM Date: 02/22/14  Intake/Output from previous day: 04/05 0701 - 04/06 0700 In: 900 [I.V.:900] Out: 2050 [Urine:1150; Emesis/NG output:900] Intake/Output this shift:    PE: Gen:  Alert, NAD, pleasant Abd: Soft, Nontender, ND, good BS, numerous well healed abdominal scars, urostomy on right, colostomy on left with large parastomal hernia palpated which is soft and non-tender. Colostomy has large amount of gas but no stool,  urostomy with urine output.    Lab Results:   Recent Labs  03/03/14 0507  WBC 6.2  HGB 12.5*  HCT 36.4*  PLT 195   BMET  Recent Labs  03/03/14 0507 03/04/14 0533  NA 144 144  K 3.3* 3.6*  CL 108 107  CO2 25 26  GLUCOSE 130* 124*  BUN 24* 22  CREATININE 1.03 1.07  CALCIUM 8.9 9.3   PT/INR No results found for this basename: LABPROT, INR,  in the last 72 hours CMP     Component Value Date/Time   NA 144 03/04/2014 0533   NA 144 08/09/2013 0813   K 3.6* 03/04/2014 0533   CL 107 03/04/2014 0533   CO2 26 03/04/2014 0533   GLUCOSE 124* 03/04/2014 0533   GLUCOSE 87 08/09/2013 0813   BUN 22 03/04/2014 0533   BUN 18 08/09/2013 0813   CREATININE 1.07 03/04/2014 0533   CALCIUM 9.3 03/04/2014 0533   PROT 8.3 02/26/2014 1428   PROT 6.6 08/09/2013 0813   ALBUMIN 3.4* 02/26/2014 1428   AST 70* 02/26/2014 1428   ALT 68* 02/26/2014 1428   ALKPHOS 58 02/26/2014 1428   BILITOT 0.7 02/26/2014 1428   GFRNONAA 63* 03/04/2014 0533   GFRAA 73* 03/04/2014 0533   Lipase     Component Value Date/Time   LIPASE 25 02/26/2014 1428        Studies/Results: No results found.  Anti-infectives: Anti-infectives   Start     Dose/Rate Route Frequency Ordered Stop   02/27/14 1600  cefTRIAXone (ROCEPHIN) 1 g in dextrose 5 % 50 mL IVPB  Status:  Discontinued     1 g 100 mL/hr over 30 Minutes Intravenous Every 24 hours 02/26/14 2018 03/03/14 1111   02/26/14 2000  cefTRIAXone (ROCEPHIN) 1 g in dextrose 5 % 50 mL IVPB  Status:  Discontinued     1 g 100 mL/hr over 30 Minutes Intravenous Every 24 hours 02/26/14 1950 02/26/14 2017   02/26/14 1630  cefTRIAXone (ROCEPHIN) 1 g in dextrose 5 % 50 mL IVPB     1 g 100 mL/hr over 30 Minutes Intravenous  Once 02/26/14 1615 02/26/14 1743       Assessment/Plan 1. SBO, recurrent  CT (02/26/14) evidence of source of obstruction in pelvis. Likely due to adhesions for gastrointestinal and urologic surgeries. Also has significant parastomal hernia which does not seem obstructed. Continue NPO, IVF, NG tube, pain control, antiemetics. His KUB (02/28/14) shows worsening bowel distension up to 7cm. Recommending transfer to [facility] but patient and family against it.  KUB from 03/02/14 showed mild improvement, but NG  tube output still 1700.  Was bloody but now bilious.  Pending recheck KUB.  Hopefully this will resolve conservatively, but very difficult situation and may not.  Consider starting TPN today.  Consider NG clamping trails, he's consuming at least 4 cups of ice water a day which is likely contributing to his output.  2. Ureteral stricture, chronic nephroureteral stenting  Has been followed by Dr. Mattie Marlin at [facility].  He is getting a stent replaced every 6 weeks.   3. Parastomal hernia at left colostomy.  This has been repaired at least once at [facility].  This is a large parastomal hernia, which is not tender and not the cause of his SBO   4. COPD  5. Hypercholesterolemia  6. History of prostate cancer - Had seed implant around 2956, but then complications from the seeds.  7. Rectouretheral  fistula - S/p cystectomy, ileal conduit, ostomy - 2002 - [facility]  8. History of coronary artery disease  9. 5.0 cm AAA - monitored by Dr. Donnetta Hutching who does not recommend surgical intervention due to high risk  10. Mildly elevated creatinine - Cr. Improved since admission  11. History of psoriasis.  12. Hypokalemia     LOS: 6 days    DORT, Jinny Blossom 03/04/2014, 7:27 AM Pager: [phone]   Addendum: 1.  Will try clamping trials.  Allow 64mL/1hr during clamping trials.  We will keep the NG tube in and re-evaluate tomorrow.  If his  symptoms return with NG clamped will hook back to suction.

## 2014-03-05 LAB — CREATININE, SERUM
Creatinine, Ser: 0.99 mg/dL (ref 0.50–1.35)
GFR calc non Af Amer: 75 mL/min — ABNORMAL LOW (ref >90)
GFR, EST AFRICAN AMERICAN: 87 mL/min — AB (ref >90)

## 2014-03-05 MED ORDER — BOOST / RESOURCE BREEZE PO LIQD
1.0000 | Freq: Three times a day (TID) | ORAL | Status: DC
Start: 2014-03-05 — End: 2014-03-11
  Administered 2014-03-05 – 2014-03-11 (×9): 1 via ORAL

## 2014-03-05 NOTE — Progress Notes (Signed)
Physical Therapy Treatment Patient Details Name: Michael Novak MRN: [id number] DOB: 03-24-1930 Today's Date: 03/05/2014    History of Present Illness 80 yo male admitted with SBO. Hx of  COPD, depression, MI, prostate cancer, skin cancer, neuropathy, colostomy    PT Comments    Progressing with mobility  Follow Up Recommendations  No PT follow up;Supervision - Intermittent     Equipment Recommendations  None recommended by PT    Recommendations for Other Services       Precautions / Restrictions Precautions Precautions: Fall Restrictions Weight Bearing Restrictions: No    Mobility  Bed Mobility               General bed mobility comments: pt sitting in recliner  Transfers Overall transfer level: Needs assistance   Transfers: Sit to/from Stand Sit to Stand: Min guard         General transfer comment: close guard for safety  Ambulation/Gait Ambulation/Gait assistance: Min assist Ambulation Distance (Feet): 350 Feet Assistive device: None (pushed IV pole) Gait Pattern/deviations: Step-through pattern     General Gait Details: slow gait speed. unsteady. challenged pt today-had him hold on to just IV pole.    Stairs            Wheelchair Mobility    Modified Rankin (Stroke Patients Only)       Balance                                    Cognition Arousal/Alertness: Awake/alert Behavior During Therapy: WFL for tasks assessed/performed Overall Cognitive Status: Within Functional Limits for tasks assessed                      Exercises      General Comments        Pertinent Vitals/Pain No c/o pain    Home Living                      Prior Function            PT Goals (current goals can now be found in the care plan section) Progress towards PT goals: Progressing toward goals    Frequency  Min 2X/week    PT Plan Current plan remains appropriate    Co-evaluation             End of  Session Equipment Utilized During Treatment: Gait belt Activity Tolerance: Patient tolerated treatment well Patient left: in chair;with call bell/phone within reach     Time: 1500-1520 PT Time Calculation (min): 20 min  Charges:  $Gait Training: 8-22 mins                    G Codes:      Weston Anna, MPT Pager: [phone]

## 2014-03-05 NOTE — Progress Notes (Signed)
NUTRITION FOLLOW UP  Intervention:   - Diet advancement per MD - [facility] Breeze TID - Will continue to monitor   Nutrition Dx:   Inadequate oral intake related to inability to eat as evidenced by NPO - ongoing but now related to clear liquid diet as evidenced by diet order    Goal:   Advance diet as tolerated to low fiber diet - not met   Monitor:   Weights, labs, diet advancement   Assessment:   Has hx of colostomy, prostate and skin CA, COPD, depression, UTI, abdominal aortic aneurysm, and diverticulosis.   4/1 - Admitted with abdominal pain that started Monday night in addition to nausea. Pt reports he has not eaten since then. States he typically has a good appetite and eats 3 healthy well balanced meals/day with a usual body weight of 190 pounds. Nutrition focused physical exam performed which was all WNL. Pt denies any nausea today, eager to have diet advanced. Only c/o light headedness. Abdominal pain mildly improved. Found to have recurrent small bowel obstruction. Surgery following.  4/7 -  NGT placed 4/1. Surgery recommended transfer to tertiary care center r/t pt's complex intra-abdominal anatomy however pt declined. KUB on 4/4 showed mild improvement pre PA notes. NGT removed today. Pt ate 100% of jello, broth, italian ice, and coffee. Denies any nausea after eating. Provided pt with sample of [employer] which he enjoyed. RN reports pt with little bit of colostomy output and that pt is passing gas.   Height: Ht Readings from Last 1 Encounters:  02/26/14 _0  (1.854 m)    Weight Status:   Wt Readings from Last 1 Encounters:  02/26/14 188 lb 6.4 oz (85.458 kg)    Re-estimated needs:  Kcal: 2100-2300 Protein: 105-125g Fluid: 2.1-2.3L/day  Skin: intact  Diet Order: Clear Liquid   Intake/Output Summary (Last 24 hours) at 03/05/14 1351 Last data filed at 03/05/14 1336  Gross per 24 hour  Intake   2865 ml  Output   1050 ml  Net   1815 ml    Last BM:  4/6    Labs:   Recent Labs Lab 03/01/14 0554 03/03/14 0507 03/04/14 0533 03/05/14 0552  NA 140 144 144  --   K 4.0 3.3* 3.6*  --   CL 101 108 107  --   CO2 _1 --   BUN 30* 24* 22  --   CREATININE 1.09 1.03 1.07 0.99  CALCIUM 9.2 8.9 9.3  --   GLUCOSE 77 130* 124*  --     CBG (last 3)  No results found for this basename: GLUCAP,  in the last 72 hours  Scheduled Meds: . enoxaparin (LOVENOX) injection  40 mg Subcutaneous Q24H  . tiotropium  18 mcg Inhalation Daily    Continuous Infusions: . dextrose 5 % and 0.45 % NaCl with KCl 20 mEq/L 75 mL/hr at 03/04/14 [address] [place], [place], [place] Pager [phone] After Hours Pager

## 2014-03-05 NOTE — Progress Notes (Signed)
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 Recurrent small bowel obstruction  -CT category as this is a high-grade obstruction with a transition point.   -?slowly  Improving; NPO, IVF/replace lytes; NG; patient declined surgery; d/w patient, family they understand the risk of perforation, death  -defer management to surgery, ? TPN per surgery; appreciate the input;  2. History of hydronephrosis status post right nephrostomy tube placement; h/o cystoprostatectomy  -This is exchanged every 6 weeks, not due until mid-May.  -probable UTI; started on IV atx; cultures E coli, but patient if afebrile, no leukocytosis; d/c atx; ? colonization   3. Chronic kidney disease stage III  -Creatinine at baseline of 1.4-1.6. 4. COPD stable; cont home regimen   D/w patient confirmed in the presence of his son Michael, Novak [phone] -he is DNR  Code Status: full Family Communication: d/w  Patient, updated his wife (indicate person spoken with, relationship, and if by phone, the number) Disposition Plan: home pend clinical improvement    Consultants:  surgery  Procedures:  None   Antibiotics:  Ceftriaxone 4/1<<<<   (indicate start date, and stop date if  known)  HPI/Subjective: alert  Objective: Filed Vitals:   03/05/14 0610  BP: 131/78  Pulse: 62  Temp: 98.1 F (36.7 C)  Resp: 18    Intake/Output Summary (Last 24 hours) at 03/05/14 1012 Last data filed at 03/05/14 0600  Gross per 24 hour  Intake   2625 ml  Output   1300 ml  Net   1325 ml   Filed Weights   02/26/14 2039  Weight: 85.458 kg (188 lb 6.4 oz)    Exam:   General:  alert  Cardiovascular: s1,s2 rrr  Respiratory: CTA BL  Abdomen: soft, colostomy present; nephrostomy   Musculoskeletal: noLE edema   Data Reviewed: Basic Metabolic Panel:  Recent Labs Lab 02/27/14 0628 02/28/14 0554 03/01/14 0554 03/03/14 0507 03/04/14 0533 03/05/14 0552  NA 135* 137 140 144 144  --   K 4.5 4.3 4.0 3.3* 3.6*  --   CL 98 99 101 108 107  --   CO2 24 23 21 25 26   --   GLUCOSE 96 78 77 130* 124*  --   BUN 32* 34* 30* 24* 22  --   CREATININE 1.50* 1.28 1.09 1.03 1.07 0.99  CALCIUM 9.2 9.1 9.2 8.9 9.3  --    Liver Function Tests:  Recent Labs Lab 02/26/14 1428  AST 70*  ALT 68*  ALKPHOS 58  BILITOT 0.7  PROT 8.3  ALBUMIN 3.4*    Recent Labs Lab 02/26/14 1428  LIPASE 25   No results found for this basename: AMMONIA,  in the last 168 hours CBC:  Recent Labs Lab 02/26/14 1428 02/27/14 0628 02/28/14 0554 03/03/14 0507  WBC 5.7 4.1 3.9* 6.2  NEUTROABS 3.6  --  2.1  --  HGB 14.2 13.0 13.5 12.5*  HCT 40.9 38.0* 39.2 36.4*  MCV 93.0 95.0 94.5 92.6  PLT 213 174 182 195   Cardiac Enzymes: No results found for this basename: CKTOTAL, CKMB, CKMBINDEX, TROPONINI,  in the last 168 hours BNP (last 3 results) No results found for this basename: PROBNP,  in the last 8760 hours CBG: No results found for this basename: GLUCAP,  in the last 168 hours  Recent Results (from the past 240 hour(s))  URINE CULTURE     Status: None   Collection Time    02/26/14  4:17 PM      Result Value Ref Range Status   Specimen Description URINE, RANDOM   Final    Special Requests NONE   Final   Culture  Setup Time     Final   Value: 02/26/2014 22:17     Performed at [facility]     Final   Value: >=100,000 COLONIES/ML     Performed at [facility]   Culture     Final   Value: ESCHERICHIA COLI     Note: Two isolates with different morphologies were identified as the same organism.The most resistant organism was reported.     Performed at [facility]   Report Status 03/02/2014 FINAL   Final   Organism ID, Bacteria ESCHERICHIA COLI   Final     Studies: Dg Abd 2 Views  03/04/2014   CLINICAL DATA:  Small bowel obstruction follow-up.  EXAM: ABDOMEN - 2 VIEW  COMPARISON:  DG ABD PORTABLE 2V dated 03/02/2014; DG ABD 2 VIEWS dated 02/27/2014; CT ABD/PELVIS W CM dated 02/26/2014  FINDINGS: NG tube noted with its tip projected over stomach. Right ureteral stent noted in stable position. Continued improvement of bone bowel gas pattern. No free air. Aortoiliac atherosclerotic vascular disease with abdominal aortic aneurysm noted.  IMPRESSION: 1. NG tube noted projected over stomach. Right ureteral stent again noted.  2. Continued resolution small bowel distention.  3. Abdominal aortic aneurysm again noted.   Electronically Signed   By: Marcello Moores  Register   On: 03/04/2014 11:47    Scheduled Meds: . enoxaparin (LOVENOX) injection  40 mg Subcutaneous Q24H  . tiotropium  18 mcg Inhalation Daily   Continuous Infusions: . dextrose 5 % and 0.45 % NaCl with KCl 20 mEq/L 75 mL/hr at 03/04/14 1406    Principal Problem:   Small bowel obstruction Active Problems:   Hydronephrosis, right status post right nephrostomy tube placement   H/O colostomy   CKD (chronic kidney disease) stage 3, GFR 30-59 ml/min   SBO (small bowel obstruction)    Time spent: >35 minutes     Kinnie Feil  [facility] Pager [phone]. If 7PM-7AM, please contact night-coverage at [email or web address], password [facility] 03/05/2014, 10:12 AM  LOS: 7 days

## 2014-03-05 NOTE — Progress Notes (Signed)
Patient tolerated NG removal well.  Ordered clear liquid meal, tolerated well without nausea and vomiting

## 2014-03-05 NOTE — Progress Notes (Signed)
Patient states that he is tolerating his liquids well and has not experienced any nausea and/or vomiting or abdominal pain at this time.  He expresses his excitement over feeling such improvement in his condition

## 2014-03-05 NOTE — Progress Notes (Signed)
Subjective: Doing well.  No abdominal pain, N/V since clamping trail and allowing clear liquid sips.  He's ambulating well.  He's happy with his progress.    Objective: Vital signs in last 24 hours: Temp:  [98.1 F (36.7 C)-98.4 F (36.9 C)] 98.1 F (36.7 C) (04/07 0610) Pulse Rate:  [58-64] 62 (04/07 0610) Resp:  [18] 18 (04/07 0610) BP: (117-131)/(69-78) 131/78 mmHg (04/07 0610) SpO2:  [92 %] 92 % (04/07 0610) Last BM Date: 03/23/14  Intake/Output from previous day: 2023/03/24 0701 - 04/07 0700 In: 2625 [P.O.:780; I.V.:1725; NG/GT:120] Out: 1300 [Urine:800; Emesis/NG output:500] Intake/Output this shift:    PE: Gen:  Alert, NAD, pleasant Abd: Soft, NT/ND, good BS, numerous well healed abdominal scars, urostomy on right, colostomy on left parastomal hernia palpated which is soft and non-tender and is less prominent than in past days. Colostomy has large amount of gas and now light brown stools, urostomy with urine output.     Lab Results:   Recent Labs  03/03/14 0507  WBC 6.2  HGB 12.5*  HCT 36.4*  PLT 195   BMET  Recent Labs  03/03/14 0507 03/23/14 0533 03/05/14 0552  NA 144 144  --   K 3.3* 3.6*  --   CL 108 107  --   CO2 25 26  --   GLUCOSE 130* 124*  --   BUN 24* 22  --   CREATININE 1.03 1.07 0.99  CALCIUM 8.9 9.3  --    PT/INR No results found for this basename: LABPROT, INR,  in the last 72 hours CMP     Component Value Date/Time   NA 144 03-23-2014 0533   NA 144 08/09/2013 0813   K 3.6* March 23, 2014 0533   CL 107 2014-03-23 0533   CO2 26 03-23-2014 0533   GLUCOSE 124* 03/23/2014 0533   GLUCOSE 87 08/09/2013 0813   BUN 22 March 23, 2014 0533   BUN 18 08/09/2013 0813   CREATININE 0.99 03/05/2014 0552   CALCIUM 9.3 Mar 23, 2014 0533   PROT 8.3 02/26/2014 1428   PROT 6.6 08/09/2013 0813   ALBUMIN 3.4* 02/26/2014 1428   AST 70* 02/26/2014 1428   ALT 68* 02/26/2014 1428   ALKPHOS 58 02/26/2014 1428   BILITOT 0.7 02/26/2014 1428   GFRNONAA 75* 03/05/2014 0552   GFRAA 87*  03/05/2014 0552   Lipase     Component Value Date/Time   LIPASE 25 02/26/2014 1428       Studies/Results: Dg Abd 2 Views  03-23-14   CLINICAL DATA:  Small bowel obstruction follow-up.  EXAM: ABDOMEN - 2 VIEW  COMPARISON:  DG ABD PORTABLE 2V dated 03/02/2014; DG ABD 2 VIEWS dated 02/27/2014; CT ABD/PELVIS W CM dated 02/26/2014  FINDINGS: NG tube noted with its tip projected over stomach. Right ureteral stent noted in stable position. Continued improvement of bone bowel gas pattern. No free air. Aortoiliac atherosclerotic vascular disease with abdominal aortic aneurysm noted.  IMPRESSION: 1. NG tube noted projected over stomach. Right ureteral stent again noted.  2. Continued resolution small bowel distention.  3. Abdominal aortic aneurysm again noted.   Electronically Signed   By: Marcello Moores  Register   On: March 23, 2014 11:47    Anti-infectives: Anti-infectives   Start     Dose/Rate Route Frequency Ordered Stop   02/27/14 1600  cefTRIAXone (ROCEPHIN) 1 g in dextrose 5 % 50 mL IVPB  Status:  Discontinued     1 g 100 mL/hr over 30 Minutes Intravenous Every 24 hours 02/26/14 2018 03/03/14 1111  02/26/14 2000  cefTRIAXone (ROCEPHIN) 1 g in dextrose 5 % 50 mL IVPB  Status:  Discontinued     1 g 100 mL/hr over 30 Minutes Intravenous Every 24 hours 02/26/14 1950 02/26/14 2017   02/26/14 1630  cefTRIAXone (ROCEPHIN) 1 g in dextrose 5 % 50 mL IVPB     1 g 100 mL/hr over 30 Minutes Intravenous  Once 02/26/14 1615 02/26/14 1743       Assessment/Plan 1. SBO, recurrent  CT (02/26/14) evidence of source of obstruction in pelvis. Likely due to adhesions for gastrointestinal and urologic surgeries. Also has significant parastomal hernia which does not seem obstructed. Continue NPO, IVF, NG tube, pain control, antiemetics. His KUB (02/28/14) shows worsening bowel distension up to 7cm. Recommending transfer to [facility] but patient and family against it.   KUB from 03/04/14 showed improvement, but NG tube output still  1700. Was bloody but now bilious. Pending recheck KUB. Hopefully this will resolve conservatively, but very difficult situation and may not. Consider starting TPN today. NG tube clamping trails went well yesterday.  Tolerating sips of clear liquids.  He has a good amount of flatus and small amounts of BM in the bag.  Hopefully this is resolving.  Will discuss with Dr. Hassell Done about discontinuing his NG tube although it did have 567mL/24 hour.  2. Ureteral stricture, chronic nephroureteral stenting  Has been followed by Dr. Mattie Marlin at [facility].  He is getting a stent replaced every 6 weeks.   3. Parastomal hernia at left colostomy.  This has been repaired at least once at [facility].  This is a large parastomal hernia, which is not tender and not the cause of his SBO   4. COPD  5. Hypercholesterolemia  6. History of prostate cancer - Had seed implant around 7416, but then complications from the seeds.  7. Rectouretheral fistula - S/p cystectomy, ileal conduit, ostomy - 2002 - [place]  8. History of coronary artery disease  9. 5.0 cm AAA - monitored by Dr. Donnetta Hutching who does not recommend surgical intervention due to high risk  10. Mildly elevated creatinine - Cr. Improved since admission  11. History of psoriasis.  12. Hypokalemia      LOS: 7 days    DORT, Jedrek Dinovo 03/05/2014, 7:02 AM Pager: [phone]

## 2014-03-05 NOTE — Progress Notes (Signed)
Patient emptied colostomy of large formed light brown stool with moderate amount of gas expelled.  Patient continues to state that he feels much improved and able to take the clear liquid diet without issues.

## 2014-03-06 NOTE — Progress Notes (Signed)
Subjective: Pt doing well.  No N/V with clear liquids, pain resolved.  Ambulating well.  Happy about progress and looking forward to going home.    Objective: Vital signs in last 24 hours: Temp:  [98 F (36.7 C)-98.9 F (37.2 C)] 98.6 F (37 C) (04/08 0600) Pulse Rate:  [59-68] 63 (04/08 0600) Resp:  [18] 18 (04/08 0600) BP: (107-132)/(59-77) 132/77 mmHg (04/08 0600) SpO2:  [93 %-94 %] 93 % (04/08 0932) Last BM Date: 03/05/14  Intake/Output from previous day: 04/07 0701 - 04/08 0700 In: 360 [P.O.:360] Out: 1250 [Urine:1000; Stool:250] Intake/Output this shift:    PE: Gen:  Alert, NAD, pleasant Abd: Soft, NT/ND, good BS, numerous well healed abdominal scars, urostomy on right, colostomy on left parastomal hernia palpated which is soft and non-tender and is less prominent than in past days. Colostomy has large amount of gas and light brown stools, urostomy with urine output.    Lab Results:  No results found for this basename: WBC, HGB, HCT, PLT,  in the last 72 hours BMET  Recent Labs  2014-04-02 0533 03/05/14 0552  NA 144  --   K 3.6*  --   CL 107  --   CO2 26  --   GLUCOSE 124*  --   BUN 22  --   CREATININE 1.07 0.99  CALCIUM 9.3  --    PT/INR No results found for this basename: LABPROT, INR,  in the last 72 hours CMP     Component Value Date/Time   NA 144 Apr 02, 2014 0533   NA 144 08/09/2013 0813   K 3.6* 04/02/14 0533   CL 107 2014/04/02 0533   CO2 26 Apr 02, 2014 0533   GLUCOSE 124* 04-02-14 0533   GLUCOSE 87 08/09/2013 0813   BUN 22 Apr 02, 2014 0533   BUN 18 08/09/2013 0813   CREATININE 0.99 03/05/2014 0552   CALCIUM 9.3 04/02/2014 0533   PROT 8.3 02/26/2014 1428   PROT 6.6 08/09/2013 0813   ALBUMIN 3.4* 02/26/2014 1428   AST 70* 02/26/2014 1428   ALT 68* 02/26/2014 1428   ALKPHOS 58 02/26/2014 1428   BILITOT 0.7 02/26/2014 1428   GFRNONAA 75* 03/05/2014 0552   GFRAA 87* 03/05/2014 0552   Lipase     Component Value Date/Time   LIPASE 25 02/26/2014 1428        Studies/Results: Dg Abd 2 Views  2014-04-02   CLINICAL DATA:  Small bowel obstruction follow-up.  EXAM: ABDOMEN - 2 VIEW  COMPARISON:  DG ABD PORTABLE 2V dated 03/02/2014; DG ABD 2 VIEWS dated 02/27/2014; CT ABD/PELVIS W CM dated 02/26/2014  FINDINGS: NG tube noted with its tip projected over stomach. Right ureteral stent noted in stable position. Continued improvement of bone bowel gas pattern. No free air. Aortoiliac atherosclerotic vascular disease with abdominal aortic aneurysm noted.  IMPRESSION: 1. NG tube noted projected over stomach. Right ureteral stent again noted.  2. Continued resolution small bowel distention.  3. Abdominal aortic aneurysm again noted.   Electronically Signed   By: Marcello Moores  Register   On: 04/02/2014 11:47    Anti-infectives: Anti-infectives   Start     Dose/Rate Route Frequency Ordered Stop   02/27/14 1600  cefTRIAXone (ROCEPHIN) 1 g in dextrose 5 % 50 mL IVPB  Status:  Discontinued     1 g 100 mL/hr over 30 Minutes Intravenous Every 24 hours 02/26/14 2018 03/03/14 1111   02/26/14 2000  cefTRIAXone (ROCEPHIN) 1 g in dextrose 5 % 50 mL IVPB  Status:  Discontinued     1 g 100 mL/hr over 30 Minutes Intravenous Every 24 hours 02/26/14 1950 02/26/14 2017   02/26/14 1630  cefTRIAXone (ROCEPHIN) 1 g in dextrose 5 % 50 mL IVPB     1 g 100 mL/hr over 30 Minutes Intravenous  Once 02/26/14 1615 02/26/14 1743       Assessment/Plan 1. SBO, recurrent  CT (02/26/14) evidence of source of obstruction in pelvis. Likely due to adhesions for gastrointestinal and urologic surgeries. Also has significant parastomal hernia which does not seem obstructed.   **NG out and tolerating clear liquids, will advance to full liquids today and soft in the AM.  Having good colostomy output.  If tolerating a soft diet and continues to have good bowel function can likely be discharged home tomorrow.  He is encouraged to have a good bowel regimen to prevent constipation and drink plenty of  water.  No specific follow up needed.  2. Ureteral stricture, chronic nephroureteral stenting  Has been followed by Dr. Mattie Marlin at [facility].  He is getting a stent replaced every 6 weeks.   3. Parastomal hernia at left colostomy.  This has been repaired at least once at [facility].  This is a large parastomal hernia, which is not tender and not the cause of his SBO   4. COPD  5. Hypercholesterolemia  6. History of prostate cancer - Had seed implant around 8657, but then complications from the seeds.  7. Rectouretheral fistula - S/p cystectomy, ileal conduit, ostomy - 2002 - [place]  8. History of coronary artery disease  9. 5.0 cm AAA - monitored by Dr. Donnetta Hutching who does not recommend surgical intervention due to high risk  10. Mildly elevated creatinine - Cr. Improved since admission  11. History of psoriasis.  12. Hypokalemia     LOS: 8 days    Coralie Keens 03/06/2014, 9:59 AM Pager: [phone]

## 2014-03-06 NOTE — Progress Notes (Signed)
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 Recurrent small bowel obstruction  -This was a high-grade obstruction with a transition point.   - Seems to be improving. NG was taken out by surgery. On Clear liquids. Patient declined surgery; d/w patient, family they understand the risk of perforation, death  -Defer management to surgery.  2. History of hydronephrosis status post right nephrostomy tube placement; h/o cystoprostatectomy  -This is exchanged every 6 weeks, not due until mid-May.  -probable UTI; started on IV atx; cultures revealed. He is no longer on antibiotics.  3. Chronic kidney disease stage III  -Creatinine at baseline of 1.4-1.6.  4. COPD stable; cont home regimen   Code Status: DNR Family Communication: d/w  Patient, No family at bedside. Son was here earlier. [place] [phone] Disposition Plan: Will return home when improved.    Consultants:  General Surgery  Procedures:  None   Antibiotics:  Ceftriaxone 4/1--4/5  HPI/Subjective: Patient denies any complaints. No nausea or vomiting. Passing gas in ostomy. Ambulating. Tolerating clear liquids.  Objective: Filed Vitals:   03/06/14 0600  BP: 132/77  Pulse: 63  Temp: 98.6 F  (37 C)  Resp: 18    Intake/Output Summary (Last 24 hours) at 03/06/14 1010 Last data filed at 03/06/14 0500  Gross per 24 hour  Intake    360 ml  Output   1250 ml  Net   -890 ml   Filed Weights   02/26/14 2039  Weight: 85.458 kg (188 lb 6.4 oz)    Exam:   General:  alert  Cardiovascular: s1,s2 rrr  Respiratory: CTA BL  Abdomen: soft, colostomy present; nephrostomy. BS present. NT.  Musculoskeletal: no LE edema   Data Reviewed: Basic Metabolic Panel:  Recent Labs Lab 02/28/14 0554 03/01/14 0554 03/03/14 0507 03/04/14 0533 03/05/14 0552  NA 137 140 144 144  --   K 4.3 4.0 3.3* 3.6*  --   CL 99 101 108 107  --   CO2 23 21 25 26   --   GLUCOSE 78 77 130* 124*  --   BUN 34* 30* 24* 22  --   CREATININE 1.28 1.09 1.03 1.07 0.99  CALCIUM 9.1 9.2 8.9 9.3  --    CBC:  Recent Labs Lab 02/28/14 0554 03/03/14 0507  WBC 3.9* 6.2  NEUTROABS 2.1  --   HGB 13.5 12.5*  HCT 39.2 36.4*  MCV 94.5 92.6  PLT 182 195     Recent Results (from the past 240 hour(s))  URINE CULTURE     Status: None   Collection Time    02/26/14  4:17 PM      Result Value Ref Range Status   Specimen Description URINE, RANDOM   Final   Special Requests NONE   Final   Culture  Setup Time  Final   Value: 02/26/2014 22:17     Performed at [place]     Final   Value: >=100,000 COLONIES/ML     Performed at [facility]   Culture     Final   Value: ESCHERICHIA COLI     Note: Two isolates with different morphologies were identified as the same organism.The most resistant organism was reported.     Performed at [facility]   Report Status 03/02/2014 FINAL   Final   Organism ID, Bacteria ESCHERICHIA COLI   Final     Studies: Dg Abd 2 Views  03/04/2014   CLINICAL DATA:  Small bowel obstruction follow-up.  EXAM: ABDOMEN - 2 VIEW  COMPARISON:  DG ABD PORTABLE 2V dated 03/02/2014; DG ABD 2 VIEWS dated 02/27/2014; CT ABD/PELVIS W CM dated 02/26/2014   FINDINGS: NG tube noted with its tip projected over stomach. Right ureteral stent noted in stable position. Continued improvement of bone bowel gas pattern. No free air. Aortoiliac atherosclerotic vascular disease with abdominal aortic aneurysm noted.  IMPRESSION: 1. NG tube noted projected over stomach. Right ureteral stent again noted.  2. Continued resolution small bowel distention.  3. Abdominal aortic aneurysm again noted.   Electronically Signed   By: Rudy   On: 03/04/2014 11:47    Scheduled Meds: . enoxaparin (LOVENOX) injection  40 mg Subcutaneous Q24H  . feeding supplement (RESOURCE BREEZE)  1 Container Oral TID BM  . tiotropium  18 mcg Inhalation Daily   Continuous Infusions: . dextrose 5 % and 0.45 % NaCl with KCl 20 mEq/L 75 mL (03/05/14 1642)    Principal Problem:   Small bowel obstruction Active Problems:   Hydronephrosis, right status post right nephrostomy tube placement   H/O colostomy   CKD (chronic kidney disease) stage 3, GFR 30-59 ml/min   SBO (small bowel obstruction)   [facility] Pager [phone].   If 7PM-7AM, please contact night-coverage at [email or web address], password [facility] 03/06/2014, 10:10 AM  LOS: 8 days

## 2014-03-07 LAB — CBC
HEMATOCRIT: 37.5 % — AB (ref 39.0–52.0)
Hemoglobin: 13 g/dL (ref 13.0–17.0)
MCH: 31.5 pg (ref 26.0–34.0)
MCHC: 34.7 g/dL (ref 30.0–36.0)
MCV: 90.8 fL (ref 78.0–100.0)
Platelets: 185 10*3/uL (ref 150–400)
RBC: 4.13 MIL/uL — AB (ref 4.22–5.81)
RDW: 13.4 % (ref 11.5–15.5)
WBC: 4.7 10*3/uL (ref 4.0–10.5)

## 2014-03-07 LAB — BASIC METABOLIC PANEL
BUN: 14 mg/dL (ref 6–23)
CALCIUM: 8.7 mg/dL (ref 8.4–10.5)
CO2: 25 meq/L (ref 19–32)
Chloride: 104 mEq/L (ref 96–112)
Creatinine, Ser: 1.08 mg/dL (ref 0.50–1.35)
GFR calc Af Amer: 72 mL/min — ABNORMAL LOW (ref >90)
GFR, EST NON AFRICAN AMERICAN: 62 mL/min — AB (ref >90)
GLUCOSE: 112 mg/dL — AB (ref 70–99)
Potassium: 4.2 mEq/L (ref 3.7–5.3)
Sodium: 139 mEq/L (ref 137–147)

## 2014-03-07 MED ORDER — POLYETHYLENE GLYCOL 3350 17 G PO PACK
17.0000 g | PACK | Freq: Every day | ORAL | Status: DC
  Administered 2014-03-07 – 2014-03-11 (×5): 17 g via ORAL
  Filled 2014-03-07 (×5): qty 1

## 2014-03-07 MED ORDER — MAGNESIUM HYDROXIDE 400 MG/5ML PO SUSP
30.0000 mL | Freq: Once | ORAL | Status: AC
  Administered 2014-03-07: 30 mL via ORAL
  Filled 2014-03-07: qty 30

## 2014-03-07 MED ORDER — DOCUSATE SODIUM 100 MG PO CAPS
100.0000 mg | ORAL_CAPSULE | Freq: Two times a day (BID) | ORAL | Status: DC
  Administered 2014-03-07 – 2014-03-11 (×9): 100 mg via ORAL
  Filled 2014-03-07 (×10): qty 1

## 2014-03-07 NOTE — Progress Notes (Signed)
Subjective: Pt was doing great yesterday.  Woke up this morning with "gas pains" and abdominal distention.  Tolerated full liquids yesterday well, but today doesn't want to eat.  Having good flatus and BM output and ostomy bag being emptied regularly.  Pt already walked through the halls today, plans on walking 2 more times.  Objective: Vital signs in last 24 hours: Temp:  [98.3 F (36.8 C)-99.4 F (37.4 C)] 98.3 F (36.8 C) (04/09 0609) Pulse Rate:  [58-69] 58 (04/09 0609) Resp:  [16-18] 18 (04/09 0609) BP: (116-123)/(66-68) 123/66 mmHg (04/09 0609) SpO2:  [92 %-95 %] 93 % (04/09 0912) Last BM Date: 03/07/14  Intake/Output from previous day: 04/08 0701 - 04/09 0700 In: 1000 [P.O.:1000] Out: 2100 [Urine:1850; Stool:250] Intake/Output this shift:    PE: Gen:  Alert, NAD, pleasant Abd: Soft, more distended than yesterday, mild tenderness around ostomy, good BS, some high pitched bowel sounds, numerous well healed abdominal scars, urostomy on right, colostomy on left parastomal hernia palpated which is soft and non-tender and is more protruding than yesterday. Colostomy has large amount of gas and light brown stools, urostomy with urine output.     Lab Results:   Recent Labs  03/07/14 0753  WBC 4.7  HGB 13.0  HCT 37.5*  PLT 185   BMET  Recent Labs  03/05/14 0552 03/07/14 0753  NA  --  139  K  --  4.2  CL  --  104  CO2  --  25  GLUCOSE  --  112*  BUN  --  14  CREATININE 0.99 1.08  CALCIUM  --  8.7   PT/INR No results found for this basename: LABPROT, INR,  in the last 72 hours CMP     Component Value Date/Time   NA 139 03/07/2014 0753   NA 144 08/09/2013 0813   K 4.2 03/07/2014 0753   CL 104 03/07/2014 0753   CO2 25 03/07/2014 0753   GLUCOSE 112* 03/07/2014 0753   GLUCOSE 87 08/09/2013 0813   BUN 14 03/07/2014 0753   BUN 18 08/09/2013 0813   CREATININE 1.08 03/07/2014 0753   CALCIUM 8.7 03/07/2014 0753   PROT 8.3 02/26/2014 1428   PROT 6.6 08/09/2013 0813   ALBUMIN  3.4* 02/26/2014 1428   AST 70* 02/26/2014 1428   ALT 68* 02/26/2014 1428   ALKPHOS 58 02/26/2014 1428   BILITOT 0.7 02/26/2014 1428   GFRNONAA 62* 03/07/2014 0753   GFRAA 72* 03/07/2014 0753   Lipase     Component Value Date/Time   LIPASE 25 02/26/2014 1428       Studies/Results: No results found.  Anti-infectives: Anti-infectives   Start     Dose/Rate Route Frequency Ordered Stop   02/27/14 1600  cefTRIAXone (ROCEPHIN) 1 g in dextrose 5 % 50 mL IVPB  Status:  Discontinued     1 g 100 mL/hr over 30 Minutes Intravenous Every 24 hours 02/26/14 2018 03/03/14 1111   02/26/14 2000  cefTRIAXone (ROCEPHIN) 1 g in dextrose 5 % 50 mL IVPB  Status:  Discontinued     1 g 100 mL/hr over 30 Minutes Intravenous Every 24 hours 02/26/14 1950 02/26/14 2017   02/26/14 1630  cefTRIAXone (ROCEPHIN) 1 g in dextrose 5 % 50 mL IVPB     1 g 100 mL/hr over 30 Minutes Intravenous  Once 02/26/14 1615 02/26/14 1743       Assessment/Plan 1. SBO, recurrent  CT (02/26/14) evidence of source of obstruction in pelvis. Likely due to  adhesions for gastrointestinal and urologic surgeries. Also has significant parastomal hernia which does not seem obstructed.   **NG out and tolerating clear liquids, tolerated fulls yesterday, but today is distended and doesn't feel like eating.  Having good colostomy output.  Continue on fulls as tolerated, needs a good bowel regimen.  Continue to ambulate frequently to aid in good bowel function, encouraged water intake.   2. Ureteral stricture, chronic nephroureteral stenting  Has been followed by Dr. Mattie Marlin at [facility].  He is getting a stent replaced every 6 weeks.   3. Parastomal hernia at left colostomy.  This has been repaired at least once at [facility].  This is a large parastomal hernia, which is not tender and not the cause of his SBO   4. COPD  5. Hypercholesterolemia  6. History of prostate cancer - Had seed implant around 9485, but then complications from the seeds.  7.  Rectouretheral fistula - S/p cystectomy, ileal conduit, ostomy - 2002 - [facility]  8. History of coronary artery disease  9. 5.0 cm AAA - monitored by Dr. Donnetta Hutching who does not recommend surgical intervention due to high risk  10. Mildly elevated creatinine - Cr. Improved since admission  11. History of psoriasis.  12. Hypokalemia     LOS: 9 days    Coralie Keens 03/07/2014, 10:56 AM Pager: [phone]

## 2014-03-07 NOTE — Progress Notes (Signed)
Seen and agree  

## 2014-03-07 NOTE — Progress Notes (Signed)
Physical Therapy Treatment Patient Details Name: EMMANUELL KANTZ MRN: [id number] DOB: 1930/05/19 Today's Date: 03/07/2014    History of Present Illness 80 yo male admitted with SBO. Hx of  COPD, depression, MI, prostate cancer, skin cancer, neuropathy, colostomy    PT Comments    Pt OOB in recliner c/o MAX ABD "cramping, gas pains" but very willing to get up and walk it off.  Used a RW just to increase gait distance/time and help pain control.  Believe pt will NOT need any AD for D/C to home.   Follow Up Recommendations  No PT follow up     Equipment Recommendations  None recommended by PT    Recommendations for Other Services       Precautions / Restrictions Precautions Precautions: Fall Restrictions Weight Bearing Restrictions: No    Mobility  Bed Mobility               General bed mobility comments: pt sitting in recliner  Transfers Overall transfer level: Needs assistance Equipment used: Rolling walker (2 wheeled) Transfers: Sit to/from Stand Sit to Stand: Supervision;Min guard         General transfer comment: close guard for safety  Ambulation/Gait Ambulation/Gait assistance: Min guard;Min assist Ambulation Distance (Feet): 500 Feet Assistive device: Rolling walker (2 wheeled) Gait Pattern/deviations: Step-through pattern Gait velocity: WFL   General Gait Details: increased c/o ABD "cramping",  "gas pains" which pt sted decreased with amb.  Used RW to increase gait distance/time.  Feel pt will NOT need any AD for D/C to home.    Stairs            Wheelchair Mobility    Modified Rankin (Stroke Patients Only)       Balance                                    Cognition                            Exercises      General Comments        Pertinent Vitals/Pain     Home Living                      Prior Function            PT Goals (current goals can now be found in the care plan section)  Progress towards PT goals: Progressing toward goals    Frequency  Min 2X/week    PT Plan      Co-evaluation             End of Session Equipment Utilized During Treatment: Gait belt Activity Tolerance: Patient tolerated treatment well Patient left: in chair;with call bell/phone within reach     Time: 1020-1044 PT Time Calculation (min): 24 min  Charges:  $Gait Training: 23-37 mins                    G Codes:      Rica Koyanagi  PTA [facility]  Acute  Rehab Pager      [phone]

## 2014-03-07 NOTE — Progress Notes (Signed)
[facility] PROGRESS NOTE  SUSANA GRIPP ZOX:[id number] DOB: 02-04-1930 DOA: 02/26/2014  PCP: Estill Dooms, MD  Assessment/Plan: Principal Problem:   Small bowel obstruction Active Problems:   Hydronephrosis, right status post right nephrostomy tube placement   H/O colostomy   CKD (chronic kidney disease) stage 3, GFR 30-59 ml/min   SBO (small bowel obstruction)   Brief History 80 y/o male with PMH of COPD, prostate cancer status post radioactive seed implantation complicated by prostatic rectal fistula status post cystoprostatectomy with ileal conduit as well as proctocolectomy with colostomy as well as right-sided nephrostomy tube placed in July of 2013 and exchanged every 6 weeks presented with  no colostomy output is admitted with bowel obstruction   1. Recurrent small bowel obstruction  -This was a high-grade obstruction with a transition point.   - Seems to be improving. NG was taken out by surgery 4/7. Currently on full liquids. Patient had declined surgery. - Complaining of 'gas pain' today. Denies nausea. Had BM in ostomy today. Abdomen is benign. Will try  Colace, miralax, MOM. -Defer management to surgery. Will defer advancement of diet to surgery.  2. History of hydronephrosis status post right nephrostomy tube placement; h/o cystoprostatectomy  -This is exchanged every 6 weeks, not due until mid-May.  -probable UTI; started on IV atx; cultures revealed. He is no longer on antibiotics.  3. Chronic kidney disease stage III  -Creatinine at baseline.  4. COPD stable; cont home regimen   Code Status: DNR Family Communication: d/w  Patient and Son. [phone] Disposition Plan: Will return home when improved. Anticipate discharge 4/10 if diet is advanced today.   Consultants:  General Surgery  Procedures:  None   Antibiotics:  Ceftriaxone 4/1--4/5  HPI/Subjective: Patient complains of 'gas pain' today. Denies nausea. Did have BM though it was hard.    Objective: Filed Vitals:   03/07/14 0609  BP: 123/66  Pulse: 58  Temp: 98.3 F (36.8 C)  Resp: 18    Intake/Output Summary (Last 24 hours) at 03/07/14 0937 Last data filed at 03/07/14 5621  Gross per 24 hour  Intake    480 ml  Output   2100 ml  Net  -1620 ml   Filed Weights   02/26/14 2039  Weight: 85.458 kg (188 lb 6.4 oz)    Exam:   General:  alert  Cardiovascular: s1,s2 rrr  Respiratory: CTA BL  Abdomen: soft, colostomy present; nephrostomy. BS present. NT.  Musculoskeletal: no LE edema   Data Reviewed: Basic Metabolic Panel:  Recent Labs Lab 03/01/14 0554 03/03/14 0507 03/04/14 0533 03/05/14 0552 03/07/14 0753  NA 140 144 144  --  139  K 4.0 3.3* 3.6*  --  4.2  CL 101 108 107  --  104  CO2 21 25 26   --  25  GLUCOSE 77 130* 124*  --  112*  BUN 30* 24* 22  --  14  CREATININE 1.09 1.03 1.07 0.99 1.08  CALCIUM 9.2 8.9 9.3  --  8.7   CBC:  Recent Labs Lab 03/03/14 0507 03/07/14 0753  WBC 6.2 4.7  HGB 12.5* 13.0  HCT 36.4* 37.5*  MCV 92.6 90.8  PLT 195 185     Recent Results (from the past 240 hour(s))  URINE CULTURE     Status: None   Collection Time    02/26/14  4:17 PM      Result Value Ref Range Status   Specimen Description URINE, RANDOM   Final   Special  Requests NONE   Final   Culture  Setup Time     Final   Value: 02/26/2014 22:17     Performed at [place]     Final   Value: >=100,000 COLONIES/ML     Performed at [facility]   Culture     Final   Value: ESCHERICHIA COLI     Note: Two isolates with different morphologies were identified as the same organism.The most resistant organism was reported.     Performed at [facility]   Report Status 03/02/2014 FINAL   Final   Organism ID, Bacteria ESCHERICHIA COLI   Final     Studies: No results found.  Scheduled Meds: . docusate sodium  100 mg Oral BID  . enoxaparin (LOVENOX) injection  40 mg Subcutaneous Q24H  . feeding  supplement (RESOURCE BREEZE)  1 Container Oral TID BM  . magnesium hydroxide  30 mL Oral Once  . polyethylene glycol  17 g Oral Daily  . tiotropium  18 mcg Inhalation Daily   Continuous Infusions: . dextrose 5 % and 0.45 % NaCl with KCl 20 mEq/L 75 mL/hr at 03/06/14 1913    Principal Problem:   Small bowel obstruction Active Problems:   Hydronephrosis, right status post right nephrostomy tube placement   H/O colostomy   CKD (chronic kidney disease) stage 3, GFR 30-59 ml/min   SBO (small bowel obstruction)   [place] [facility] Pager [phone].   If 7PM-7AM, please contact night-coverage at [email or web address], password [facility] 03/07/2014, 9:37 AM  LOS: 9 days

## 2014-03-08 ENCOUNTER — Inpatient Hospital Stay (HOSPITAL_COMMUNITY): Payer: Medicare Other

## 2014-03-08 MED ORDER — MORPHINE SULFATE 2 MG/ML IJ SOLN
1.0000 mg | INTRAMUSCULAR | Status: DC | PRN
  Administered 2014-03-08 – 2014-03-11 (×14): 1 mg via INTRAVENOUS
  Filled 2014-03-08 (×13): qty 1

## 2014-03-08 MED ORDER — IOHEXOL 300 MG/ML  SOLN
50.0000 mL | INTRAMUSCULAR | Status: AC
  Administered 2014-03-08 (×2): 50 mL via ORAL

## 2014-03-08 MED ORDER — IOHEXOL 300 MG/ML  SOLN
100.0000 mL | Freq: Once | INTRAMUSCULAR | Status: AC | PRN
  Administered 2014-03-08: 100 mL via INTRAVENOUS

## 2014-03-08 MED ORDER — MORPHINE SULFATE 2 MG/ML IJ SOLN
1.0000 mg | Freq: Once | INTRAMUSCULAR | Status: AC
  Administered 2014-03-08: 1 mg via INTRAVENOUS
  Filled 2014-03-08: qty 1

## 2014-03-08 NOTE — Progress Notes (Signed)
[facility] PROGRESS NOTE  Michael Novak WLN:[id number] DOB: 1929-12-28 DOA: 02/26/2014  PCP: Estill Dooms, MD  Assessment/Plan: Principal Problem:   Small bowel obstruction Active Problems:   Hydronephrosis, right status post right nephrostomy tube placement   H/O colostomy   CKD (chronic kidney disease) stage 3, GFR 30-59 ml/min   SBO (small bowel obstruction)   Brief History 80 y/o male with PMH of COPD, prostate cancer status post radioactive seed implantation complicated by prostatic rectal fistula status post cystoprostatectomy with ileal conduit as well as proctocolectomy with colostomy as well as right-sided nephrostomy tube placed in July of 2013 and exchanged every 6 weeks. He presented with no colostomy output and was admitted with bowel obstruction.  1. Recurrent small bowel obstruction  -This was a high-grade obstruction with a transition point.   - Seems to be improving. NG was taken out by surgery 4/7. Currently on full liquids. Patient had declined surgery. - Complaining of 'gas pain' 4/9. Much improved today. Denies nausea. Had BM in ostomy today. Abdomen is benign. Continue Colace, miralax. MOM was given 4/9. Try soft diet today. - General surgery is following as well.  2. History of hydronephrosis status post right nephrostomy tube placement; h/o cystoprostatectomy  -This is exchanged every 6 weeks, not due until mid-May.  -probable UTI; started on IV atx; cultures revealed. He is no longer on antibiotics.  3. Chronic kidney disease stage III  -Creatinine at baseline.  4. COPD stable; cont home regimen   Code Status: DNR Family Communication: d/w  Patient and Son. [phone] Disposition Plan: Had a minor setback yesterday. Much better today. Either discharge later today or tomorrow. Also await surgery input regarding this matter.   Consultants:  General Surgery  Procedures:  None   Antibiotics:  Ceftriaxone  4/1--4/5  HPI/Subjective: Patient much better today. No nausea. BM this AM in ostomy.   Objective: Filed Vitals:   03/08/14 0542  BP: 123/78  Pulse: 68  Temp: 98.2 F (36.8 C)  Resp: 18    Intake/Output Summary (Last 24 hours) at 03/08/14 0842 Last data filed at 03/08/14 0750  Gross per 24 hour  Intake    300 ml  Output   1450 ml  Net  -1150 ml   Filed Weights   02/26/14 2039  Weight: 85.458 kg (188 lb 6.4 oz)    Exam:   General:  alert  Cardiovascular: s1,s2 rrr  Respiratory: CTA BL  Abdomen: soft, colostomy present; nephrostomy. BS present. NT.  Musculoskeletal: no LE edema   Data Reviewed: Basic Metabolic Panel:  Recent Labs Lab 03/03/14 0507 03/04/14 0533 03/05/14 0552 03/07/14 0753  NA 144 144  --  139  K 3.3* 3.6*  --  4.2  CL 108 107  --  104  CO2 25 26  --  25  GLUCOSE 130* 124*  --  112*  BUN 24* 22  --  14  CREATININE 1.03 1.07 0.99 1.08  CALCIUM 8.9 9.3  --  8.7   CBC:  Recent Labs Lab 03/03/14 0507 03/07/14 0753  WBC 6.2 4.7  HGB 12.5* 13.0  HCT 36.4* 37.5*  MCV 92.6 90.8  PLT 195 185     Recent Results (from the past 240 hour(s))  URINE CULTURE     Status: None   Collection Time    02/26/14  4:17 PM      Result Value Ref Range Status   Specimen Description URINE, RANDOM   Final   Special Requests NONE  Final   Culture  Setup Time     Final   Value: 02/26/2014 22:17     Performed at [place]     Final   Value: >=100,000 COLONIES/ML     Performed at [facility]   Culture     Final   Value: ESCHERICHIA COLI     Note: Two isolates with different morphologies were identified as the same organism.The most resistant organism was reported.     Performed at [facility]   Report Status 03/02/2014 FINAL   Final   Organism ID, Bacteria ESCHERICHIA COLI   Final     Studies: No results found.  Scheduled Meds: . docusate sodium  100 mg Oral BID  . enoxaparin (LOVENOX)  injection  40 mg Subcutaneous Q24H  . feeding supplement (RESOURCE BREEZE)  1 Container Oral TID BM  . polyethylene glycol  17 g Oral Daily  . tiotropium  18 mcg Inhalation Daily   Continuous Infusions:    Principal Problem:   Small bowel obstruction Active Problems:   Hydronephrosis, right status post right nephrostomy tube placement   H/O colostomy   CKD (chronic kidney disease) stage 3, GFR 30-59 ml/min   SBO (small bowel obstruction)   [name]  [facility] Pager [phone]  If 7PM-7AM, please contact night-coverage at [email or web address], password [facility] 03/08/2014, 8:42 AM  LOS: 10 days

## 2014-03-08 NOTE — Progress Notes (Signed)
Subjective: Pt was feeling great this am, but after eating oatmeal and coffee he's having cramping again like yesterday.  He had good stool output overnight and this am.  Good flatus.  Still distended.  Ambulating well.    Objective: Vital signs in last 24 hours: Temp:  [97.9 F (36.6 C)-98.2 F (36.8 C)] 98.2 F (36.8 C) (04/10 0542) Pulse Rate:  [62-70] 68 (04/10 0542) Resp:  [16-18] 18 (04/10 0542) BP: (118-132)/(71-80) 123/78 mmHg (04/10 0542) SpO2:  [92 %-95 %] 92 % (04/10 0542) Last BM Date: 03/07/14  Intake/Output from previous day: 04/09 0701 - 04/10 0700 In: 300 [P.O.:300] Out: 1250 [Urine:1250] Intake/Output this shift: Total I/O In: -  Out: 200 [Urine:200]  PE: Gen:  Alert, NAD, pleasant Abd: Soft, more distended than 2 days ago, mild tenderness throughout, good BS, some high pitched bowel sounds, numerous well healed abdominal scars, urostomy on right, colostomy on left parastomal hernia palpated which is soft and non-tender and is more protruding than yesterday. Colostomy has large amount of gas and light brown stools, urostomy with urine output.    Lab Results:   Recent Labs  03/07/14 0753  WBC 4.7  HGB 13.0  HCT 37.5*  PLT 185   BMET  Recent Labs  03/07/14 0753  NA 139  K 4.2  CL 104  CO2 25  GLUCOSE 112*  BUN 14  CREATININE 1.08  CALCIUM 8.7   PT/INR No results found for this basename: LABPROT, INR,  in the last 72 hours CMP     Component Value Date/Time   NA 139 03/07/2014 0753   NA 144 08/09/2013 0813   K 4.2 03/07/2014 0753   CL 104 03/07/2014 0753   CO2 25 03/07/2014 0753   GLUCOSE 112* 03/07/2014 0753   GLUCOSE 87 08/09/2013 0813   BUN 14 03/07/2014 0753   BUN 18 08/09/2013 0813   CREATININE 1.08 03/07/2014 0753   CALCIUM 8.7 03/07/2014 0753   PROT 8.3 02/26/2014 1428   PROT 6.6 08/09/2013 0813   ALBUMIN 3.4* 02/26/2014 1428   AST 70* 02/26/2014 1428   ALT 68* 02/26/2014 1428   ALKPHOS 58 02/26/2014 1428   BILITOT 0.7 02/26/2014 1428    GFRNONAA 62* 03/07/2014 0753   GFRAA 72* 03/07/2014 0753   Lipase     Component Value Date/Time   LIPASE 25 02/26/2014 1428       Studies/Results: No results found.  Anti-infectives: Anti-infectives   Start     Dose/Rate Route Frequency Ordered Stop   02/27/14 1600  cefTRIAXone (ROCEPHIN) 1 g in dextrose 5 % 50 mL IVPB  Status:  Discontinued     1 g 100 mL/hr over 30 Minutes Intravenous Every 24 hours 02/26/14 2018 03/03/14 1111   02/26/14 2000  cefTRIAXone (ROCEPHIN) 1 g in dextrose 5 % 50 mL IVPB  Status:  Discontinued     1 g 100 mL/hr over 30 Minutes Intravenous Every 24 hours 02/26/14 1950 02/26/14 2017   02/26/14 1630  cefTRIAXone (ROCEPHIN) 1 g in dextrose 5 % 50 mL IVPB     1 g 100 mL/hr over 30 Minutes Intravenous  Once 02/26/14 1615 02/26/14 1743       Assessment/Plan 1. SBO, recurrent  CT (02/26/14) evidence of source of obstruction in pelvis. Likely due to adhesions for gastrointestinal and urologic surgeries. Also has significant parastomal hernia which does not seem obstructed.  -NG out and tolerating clear liquids, seems to be having trouble everytime he eats fulls, today is distended  and doesn't feel like eating again. Having good colostomy output.  Will get a CT scan at the recommendation of the radiologist to eval for strictures, obstruction.  If CT is inconclusive then we could try a SBFT.  Needs a good bowel regimen. Continue to ambulate frequently to aid in good bowel function, encouraged water intake.   2. Ureteral stricture, chronic nephroureteral stenting  Has been followed by Dr. Mattie Marlin at [facility].  He is getting a stent replaced every 6 weeks.   3. Parastomal hernia at left colostomy.  This has been repaired at least [facility] at [facility].  This is a large parastomal hernia, which is not tender and not the cause of his SBO   4. COPD  5. Hypercholesterolemia  6. History of prostate cancer - Had seed implant around 7353, but then complications from the seeds.   7. Rectouretheral fistula - S/p cystectomy, ileal conduit, ostomy - 2002 - [facility]  8. History of coronary artery disease  9. 5.0 cm AAA - monitored by Dr. Donnetta Hutching who does not recommend surgical intervention due to high risk  10. Mildly elevated creatinine - Cr. Improved since admission  11. History of psoriasis.  12. Hypokalemia     LOS: 10 days    Coralie Keens 03/08/2014, 11:58 AM Pager: [phone]

## 2014-03-09 ENCOUNTER — Inpatient Hospital Stay (HOSPITAL_COMMUNITY): Payer: Medicare Other

## 2014-03-09 LAB — BASIC METABOLIC PANEL
BUN: 16 mg/dL (ref 6–23)
CO2: 25 meq/L (ref 19–32)
CREATININE: 1.36 mg/dL — AB (ref 0.50–1.35)
Calcium: 9.1 mg/dL (ref 8.4–10.5)
Chloride: 98 mEq/L (ref 96–112)
GFR calc non Af Amer: 47 mL/min — ABNORMAL LOW (ref >90)
GFR, EST AFRICAN AMERICAN: 55 mL/min — AB (ref >90)
Glucose, Bld: 104 mg/dL — ABNORMAL HIGH (ref 70–99)
Potassium: 3.8 mEq/L (ref 3.7–5.3)
SODIUM: 134 meq/L — AB (ref 137–147)

## 2014-03-09 LAB — CBC
HCT: 39.1 % (ref 39.0–52.0)
Hemoglobin: 13.7 g/dL (ref 13.0–17.0)
MCH: 32.3 pg (ref 26.0–34.0)
MCHC: 35 g/dL (ref 30.0–36.0)
MCV: 92.2 fL (ref 78.0–100.0)
PLATELETS: 203 10*3/uL (ref 150–400)
RBC: 4.24 MIL/uL (ref 4.22–5.81)
RDW: 13.7 % (ref 11.5–15.5)
WBC: 5.2 10*3/uL (ref 4.0–10.5)

## 2014-03-09 NOTE — Progress Notes (Signed)
Subjective: Had a rough day yesterday with some cramping.  Passing stool and gas per ostomy.  Feels better today.  CT discussed with him.   Objective: Vital signs in last 24 hours: Temp:  [97.9 F (36.6 C)-98.2 F (36.8 C)] 97.9 F (36.6 C) (04/11 0511) Pulse Rate:  [61-71] 65 (04/11 0511) Resp:  [16] 16 (04/11 0511) BP: (110-129)/(69-78) 110/69 mmHg (04/11 0511) SpO2:  [94 %-95 %] 95 % (04/11 0511) Last BM Date: 03/07/14  Intake/Output from previous day: 04/10 0701 - 04/11 0700 In: -  Out: 2500 [Urine:2500] Intake/Output this shift:    PE: General- In NAD Abdomen-soft, nontender, nondistended, gas and liquid stool in colostomy with parastomal fullness, RLQ urostomy.  Lab Results:   Recent Labs  03/07/14 0753  WBC 4.7  HGB 13.0  HCT 37.5*  PLT 185   BMET  Recent Labs  03/07/14 0753  NA 139  K 4.2  CL 104  CO2 25  GLUCOSE 112*  BUN 14  CREATININE 1.08  CALCIUM 8.7   PT/INR No results found for this basename: LABPROT, INR,  in the last 72 hours Comprehensive Metabolic Panel:    Component Value Date/Time   NA 139 03/07/2014 0753   NA 144 03/04/2014 0533   NA 144 08/09/2013 0813   K 4.2 03/07/2014 0753   K 3.6* 03/04/2014 0533   CL 104 03/07/2014 0753   CL 107 03/04/2014 0533   CO2 25 03/07/2014 0753   CO2 26 03/04/2014 0533   BUN 14 03/07/2014 0753   BUN 22 03/04/2014 0533   BUN 18 08/09/2013 0813   CREATININE 1.08 03/07/2014 0753   CREATININE 0.99 03/05/2014 0552   GLUCOSE 112* 03/07/2014 0753   GLUCOSE 124* 03/04/2014 0533   GLUCOSE 87 08/09/2013 0813   CALCIUM 8.7 03/07/2014 0753   CALCIUM 9.3 03/04/2014 0533   AST 70* 02/26/2014 1428   AST 15 08/09/2013 0813   ALT 68* 02/26/2014 1428   ALT 18 08/09/2013 0813   ALKPHOS 58 02/26/2014 1428   ALKPHOS 105 08/09/2013 0813   BILITOT 0.7 02/26/2014 1428   BILITOT 0.3 08/09/2013 0813   PROT 8.3 02/26/2014 1428   PROT 6.6 08/09/2013 0813   PROT 6.6 04/05/2013 0607   ALBUMIN 3.4* 02/26/2014 1428   ALBUMIN 2.5* 04/05/2013 0607      Studies/Results: Ct Abdomen Pelvis W Contrast  03/08/2014   CLINICAL DATA:  Mid abdominal pain with persistent obstruction. Multiple abdominal surgeries. Prostate cancer.  EXAM: CT ABDOMEN AND PELVIS WITH CONTRAST  TECHNIQUE: Multidetector CT imaging of the abdomen and pelvis was performed using the standard protocol following bolus administration of intravenous contrast.  CONTRAST:  177mL OMNIPAQUE IOHEXOL 300 MG/ML  SOLN  COMPARISON:  DG ABD 2 VIEWS dated 03/04/2014; DG ABD PORTABLE 2V dated 03/02/2014; CT ABD/PELVIS W CM dated 02/26/2014  FINDINGS: There is chronic scarring and paraseptal bleb formation at both lung bases. There is increased right lower lobe atelectasis. No significant pleural or pericardial effusion is present. There are diffuse coronary artery calcifications.  The liver, spleen, gallbladder, pancreas and adrenal glands appear stable. The pancreas is atrophied.  Right-sided nephro ureteral catheter remains in place, extending into a right lower quadrant ostomy. There is stable chronic renal cortical thinning and cyst formation. There is new left-sided hydronephrosis and hydroureter with delayed contrast excretion. The left ureter appears dilated to the right lower quadrant ostomy.  Again demonstrated are multiple dilated loops of small bowel, especially proximally. The distal small bowel appears relatively decompressed.  There is a stable parastomal hernia surrounding the distal transverse colostomy in the left lower quadrant. This is not suspected to reflect the level of the bowel obstruction. No bowel perforation or extraluminal fluid collection is identified.  In the right lower quadrant, there is omental soft tissue nodularity suspicious for peritoneal tumor. The largest component measures 2.4 x 4.3 cm on image 78. There is a smaller component more superiorly on image 71. There is also irregular soft tissue in the perineum adjacent to the prostate brachytherapy seeds which could reflect  residual tumor. This measures 5.5 x 3.6 cm on image 96. Signs of chronic pelvic floor relaxation are noted.  No acute or worrisome osseous findings are seen. There is a stable chronic T12 compression deformity.  IMPRESSION: 1. Persistent distal small bowel obstruction, possibly in the inferior pelvis as previously suggested. Recurrent tumor in the prostatectomy bed cannot be excluded. 2. There is suspicion of additional tumor in the right lower quadrant. 3. New obstruction of the distal left ureter near the ostomy. The right ureter remains decompressed by a nephro ureteral catheter. 4. Stable parastomal herniation of colon around the transverse colostomy.   Electronically Signed   By: Camie Patience M.D.   On: 03/08/2014 19:12    Anti-infectives: Anti-infectives   Start     Dose/Rate Route Frequency Ordered Stop   02/27/14 1600  cefTRIAXone (ROCEPHIN) 1 g in dextrose 5 % 50 mL IVPB  Status:  Discontinued     1 g 100 mL/hr over 30 Minutes Intravenous Every 24 hours 02/26/14 2018 03/03/14 1111   02/26/14 2000  cefTRIAXone (ROCEPHIN) 1 g in dextrose 5 % 50 mL IVPB  Status:  Discontinued     1 g 100 mL/hr over 30 Minutes Intravenous Every 24 hours 02/26/14 1950 02/26/14 2017   02/26/14 1630  cefTRIAXone (ROCEPHIN) 1 g in dextrose 5 % 50 mL IVPB     1 g 100 mL/hr over 30 Minutes Intravenous  Once 02/26/14 1615 02/26/14 1743      Assessment Principal Problem:   Recurrent partial small bowel obstruction-CT reviewed;  Nodules noted on this CT concerning for tumor; point of partial obstruction appears to be in the right pelvis. Active Problems:   Hydronephrosis, right status post right nephrostomy tube placement   H/O colostomy with parastomal hernia   CKD (chronic kidney disease) stage 3, GFR 30-59 ml/min       LOS: 11 days   Plan: Check x-rays today.  Try low residue diet.  If non-operative management fails, he would require a surgery to correct the obstruction which would be very complicated  given his overall situation-more complicated than we are comfortable dealing with in [place] and so he would need to go back to [facility].  We discussed the plan and he agrees.   Rhunette Croft Darald Uzzle 03/09/2014

## 2014-03-09 NOTE — Progress Notes (Signed)
[facility] PROGRESS NOTE  Michael Novak VZD:[id number] DOB: 10/19/1930 DOA: 02/26/2014  PCP: Estill Dooms, MD  Assessment/Plan: Principal Problem:   Small bowel obstruction Active Problems:   Hydronephrosis, right status post right nephrostomy tube placement   H/O colostomy   CKD (chronic kidney disease) stage 3, GFR 30-59 ml/min   SBO (small bowel obstruction)   Brief History 80 y/o male with PMH of COPD, prostate cancer status post radioactive seed implantation complicated by prostatic rectal fistula status post cystoprostatectomy with ileal conduit as well as proctocolectomy with colostomy as well as right-sided nephrostomy tube placed in July of 2013 and exchanged every 6 weeks. He presented with no colostomy output and was admitted with bowel obstruction.  1. Recurrent small bowel obstruction  -This was a high-grade obstruction with a transition point.   - Patient was improving but then he started experiencing pain and discomfort. He underwent CT which shows persistent SBO. Surgery is managing for most part. They want to continue trying conservative approach. If patient doesn't improve he will need to be transferred to another facility such as [facility]. NG was taken out by surgery 4/7.   2. History of hydronephrosis status post right nephrostomy tube placement; h/o cystoprostatectomy  -This is exchanged every 6 weeks, not due until mid-May.  -probable UTI; started on IV atx; cultures revealed. He is no longer on antibiotics. - CT from 4/10 shows new left hydronephrosis with distal ureteral dilatation. Renal function is stable. His urologist is Dr. Amalia Hailey at [facility]. He will need to see his Urologist soon. Ct also suggests possible recurrence of tumor.  3. Chronic kidney disease stage III  -Creatinine at baseline.  4. COPD stable; cont home regimen   Code Status: DNR Family Communication: d/w Patient. Son [phone] Disposition Plan: Patient has had setback. Surgery  trying to determine best course of action. Not ready for discharge.  Consultants:  General Surgery  Procedures:  None   Antibiotics:  Ceftriaxone 4/1--4/5  HPI/Subjective: Patient with some abdominal discomfort and distension.   Objective: Filed Vitals:   03/09/14 0511  BP: 110/69  Pulse: 65  Temp: 97.9 F (36.6 C)  Resp: 16    Intake/Output Summary (Last 24 hours) at 03/09/14 0938 Last data filed at 03/09/14 0500  Gross per 24 hour  Intake      0 ml  Output   2300 ml  Net  -2300 ml   Filed Weights   02/26/14 2039  Weight: 85.458 kg (188 lb 6.4 oz)    Exam:   General:  alert  Cardiovascular: s1,s2 rrr  Respiratory: CTA BL  Abdomen: soft, colostomy present; Some distension noted. BS present. Minimally tender. BS not heard well today.  Musculoskeletal: no LE edema   Data Reviewed: Basic Metabolic Panel:  Recent Labs Lab 03/03/14 0507 03/04/14 0533 03/05/14 0552 03/07/14 0753  NA 144 144  --  139  K 3.3* 3.6*  --  4.2  CL 108 107  --  104  CO2 25 26  --  25  GLUCOSE 130* 124*  --  112*  BUN 24* 22  --  14  CREATININE 1.03 1.07 0.99 1.08  CALCIUM 8.9 9.3  --  8.7   CBC:  Recent Labs Lab 03/03/14 0507 03/07/14 0753  WBC 6.2 4.7  HGB 12.5* 13.0  HCT 36.4* 37.5*  MCV 92.6 90.8  PLT 195 185    Studies: Ct Abdomen Pelvis W Contrast  03/08/2014   CLINICAL DATA:  Mid abdominal pain with  persistent obstruction. Multiple abdominal surgeries. Prostate cancer.  EXAM: CT ABDOMEN AND PELVIS WITH CONTRAST  TECHNIQUE: Multidetector CT imaging of the abdomen and pelvis was performed using the standard protocol following bolus administration of intravenous contrast.  CONTRAST:  112mL OMNIPAQUE IOHEXOL 300 MG/ML  SOLN  COMPARISON:  DG ABD 2 VIEWS dated 03/04/2014; DG ABD PORTABLE 2V dated 03/02/2014; CT ABD/PELVIS W CM dated 02/26/2014  FINDINGS: There is chronic scarring and paraseptal bleb formation at both lung bases. There is increased right lower lobe  atelectasis. No significant pleural or pericardial effusion is present. There are diffuse coronary artery calcifications.  The liver, spleen, gallbladder, pancreas and adrenal glands appear stable. The pancreas is atrophied.  Right-sided nephro ureteral catheter remains in place, extending into a right lower quadrant ostomy. There is stable chronic renal cortical thinning and cyst formation. There is new left-sided hydronephrosis and hydroureter with delayed contrast excretion. The left ureter appears dilated to the right lower quadrant ostomy.  Again demonstrated are multiple dilated loops of small bowel, especially proximally. The distal small bowel appears relatively decompressed. There is a stable parastomal hernia surrounding the distal transverse colostomy in the left lower quadrant. This is not suspected to reflect the level of the bowel obstruction. No bowel perforation or extraluminal fluid collection is identified.  In the right lower quadrant, there is omental soft tissue nodularity suspicious for peritoneal tumor. The largest component measures 2.4 x 4.3 cm on image 78. There is a smaller component more superiorly on image 71. There is also irregular soft tissue in the perineum adjacent to the prostate brachytherapy seeds which could reflect residual tumor. This measures 5.5 x 3.6 cm on image 96. Signs of chronic pelvic floor relaxation are noted.  No acute or worrisome osseous findings are seen. There is a stable chronic T12 compression deformity.  IMPRESSION: 1. Persistent distal small bowel obstruction, possibly in the inferior pelvis as previously suggested. Recurrent tumor in the prostatectomy bed cannot be excluded. 2. There is suspicion of additional tumor in the right lower quadrant. 3. New obstruction of the distal left ureter near the ostomy. The right ureter remains decompressed by a nephro ureteral catheter. 4. Stable parastomal herniation of colon around the transverse colostomy.    Electronically Signed   By: Camie Patience M.D.   On: 03/08/2014 19:12    Scheduled Meds: . docusate sodium  100 mg Oral BID  . enoxaparin (LOVENOX) injection  40 mg Subcutaneous Q24H  . feeding supplement (RESOURCE BREEZE)  1 Container Oral TID BM  . polyethylene glycol  17 g Oral Daily  . tiotropium  18 mcg Inhalation Daily   Continuous Infusions:    Principal Problem:   Small bowel obstruction Active Problems:   Hydronephrosis, right status post right nephrostomy tube placement   H/O colostomy   CKD (chronic kidney disease) stage 3, GFR 30-59 ml/min   SBO (small bowel obstruction)   [name]  [facility] Pager [phone]  If 7PM-7AM, please contact night-coverage at [email or web address], password [facility] 03/09/2014, 9:38 AM  LOS: 11 days

## 2014-03-09 NOTE — Progress Notes (Signed)
Physical Therapy Treatment Patient Details Name: TEREK BEE MRN: [id number] DOB: 19-Jul-1930 Today's Date: 2014-04-07    History of Present Illness 80 yo male admitted with SBO. Hx of  COPD, depression, MI, prostate cancer, skin cancer, neuropathy, colostomy    PT Comments    Pt c/o increased weakness reporting very little nutrition intake and starting to feel "bad".  Also still c/o a lot ABD pain "cramping".  Amb in hallway decreased distance this session versus last.   Follow Up Recommendations  No PT follow up     Equipment Recommendations  None recommended by PT    Recommendations for Other Services       Precautions / Restrictions Precautions Precautions: Fall Precaution Comments: clear liquid diet Restrictions Weight Bearing Restrictions: No    Mobility  Bed Mobility    Pt OOB in recliner           General bed mobility comments: pt sitting in recliner/slept in recliner  Transfers Overall transfer level: Needs assistance Equipment used: Rolling walker (2 wheeled) Transfers: Sit to/from Stand Sit to Stand: Supervision;Min guard         General transfer comment: close guard for safety and increased time due to increased ABD pain   Ambulation/Gait Ambulation/Gait assistance: Min guard Ambulation Distance (Feet): 450 Feet Assistive device: Rolling walker (2 wheeled) Gait Pattern/deviations: Step-through pattern Gait velocity: decreased   General Gait Details: increased amb pain "cramping" and increased c/o fatigue.  Decreased amb distance this session due to increased c/o "feeling weak"   Amb with RW for increased support however pt will not need one for D/C.    Stairs            Wheelchair Mobility    Modified Rankin (Stroke Patients Only)       Balance                                    Cognition                            Exercises      General Comments        Pertinent Vitals/Pain     Home  Living                      Prior Function            PT Goals (current goals can now be found in the care plan section) Progress towards PT goals: Progressing toward goals    Frequency       PT Plan      Co-evaluation             End of Session Equipment Utilized During Treatment: Gait belt Activity Tolerance: Patient limited by fatigue Patient left: in chair;with call bell/phone within reach     Time: 1156-1221 PT Time Calculation (min): 25 min  Charges:  $Gait Training: 8-22 mins $Therapeutic Activity: 8-22 mins                    G Codes:      Donnel Saxon Marlaya Turck Apr 07, 2014, 3:26 PM

## 2014-03-10 ENCOUNTER — Inpatient Hospital Stay (HOSPITAL_COMMUNITY): Payer: Medicare Other

## 2014-03-10 DIAGNOSIS — N1330 Unspecified hydronephrosis: Secondary | ICD-10-CM | POA: Clinically undetermined

## 2014-03-10 LAB — BASIC METABOLIC PANEL
BUN: 19 mg/dL (ref 6–23)
CALCIUM: 8.9 mg/dL (ref 8.4–10.5)
CO2: 26 mEq/L (ref 19–32)
Chloride: 101 mEq/L (ref 96–112)
Creatinine, Ser: 1.64 mg/dL — ABNORMAL HIGH (ref 0.50–1.35)
GFR calc Af Amer: 44 mL/min — ABNORMAL LOW (ref >90)
GFR calc non Af Amer: 38 mL/min — ABNORMAL LOW (ref >90)
GLUCOSE: 115 mg/dL — AB (ref 70–99)
POTASSIUM: 4.3 meq/L (ref 3.7–5.3)
SODIUM: 136 meq/L — AB (ref 137–147)

## 2014-03-10 MED ORDER — SODIUM CHLORIDE 0.9 % IV SOLN
INTRAVENOUS | Status: AC
  Administered 2014-03-10 – 2014-03-11 (×3): via INTRAVENOUS

## 2014-03-10 NOTE — Discharge Instructions (Addendum)
Low residue diet-Soft foods.  No raw vegetables, only soft fruits.  No nuts.  Avoid foods that give you gas.  Minimize dairy products.  Small Bowel Obstruction A small bowel obstruction is a blockage (obstruction) of the small intestine (small bowel). The small bowel is a long, slender tube that connects the stomach to the colon. Its job is to absorb nutrients from the fluids and foods you consume into the bloodstream.  CAUSES  There are many causes of intestinal blockage. The most common ones include:  Hernias. This is a more common cause in children than adults.  Inflammatory bowel disease (enteritis and colitis).  Twisting of the bowel (volvulus).  Tumors.  Scar tissue (adhesions) from previous surgery or radiation treatment.  Recent surgery. This may cause an acute small bowel obstruction called an ileus. SYMPTOMS   Abdominal pain. This may be dull cramps or sharp pain. It may occur in one area or may be present in the entire abdomen. Pain can range from mild to severe, depending on the degree of obstruction.  Nausea and vomiting. Vomit may be greenish or yellow bile color.  Distended or swollen stomach. Abdominal bloating is a common symptom.  Constipation.  Lack of passing gas.  Frequent belching.  Diarrhea. This may occur if runny stool is able to leak around the obstruction. DIAGNOSIS  Your caregiver can usually diagnose small bowel obstruction by taking a history, doing a physical exam, and taking X-rays. If the cause is unclear, a CT scan (computerized tomography) of your abdomen and pelvis may be needed. TREATMENT  Treatment of the blockage depends on the cause and how bad the problem is.   Sometimes, the obstruction improves with bed rest and intravenous (IV) fluids.  Resting the bowel is very important. This means following a simple diet. Sometimes, a clear liquid diet may be required for several days.  Sometimes, a small tube (nasogastric tube) is placed into  the stomach to decompress the bowel. When the bowel is blocked, it usually swells up like a balloon filled with air and fluids. Decompression means that the air and fluids are removed by suction through that tube. This can help with pain, discomfort, and nausea. It can also help the obstruction resolve faster.  Surgery may be required if other treatments do not work. Bowel obstruction from a hernia may require early surgery and can be an emergency procedure. Adhesions that cause frequent or severe obstructions may also require surgery. HOME CARE INSTRUCTIONS If your bowel obstruction is only partial or incomplete, you may be allowed to go home.  Get plenty of rest.  Follow your diet as directed by your caregiver.  Only consume clear liquids until your condition improves.  Avoid solid foods as instructed. SEEK IMMEDIATE MEDICAL CARE IF:  You have increased pain or cramping.  You vomit blood.  You have uncontrolled vomiting or nausea.  You cannot drink fluids due to vomiting or pain.  You develop confusion.  You begin feeling very dry or thirsty (dehydrated).  You have severe bloating.  You have chills.  You have a fever.  You feel extremely weak or you faint. MAKE SURE YOU:  Understand these instructions.  Will watch your condition.  Will get help right away if you are not doing well or get worse. Document Released: 02/01/2006 Document Revised: 02/07/2012 Document Reviewed: 01/29/2011 [facility] Patient Information 2014 [place].

## 2014-03-10 NOTE — Progress Notes (Signed)
Subjective: Eating about 25% of his meals and tolerating this well.  Objective: Vital signs in last 24 hours: Temp:  [97.9 F (36.6 C)-99.8 F (37.7 C)] 98.3 F (36.8 C) (04/12 0526) Pulse Rate:  [59-68] 59 (04/12 0526) Resp:  [16] 16 (04/12 0526) BP: (104-121)/(59-75) 108/59 mmHg (04/12 0526) SpO2:  [93 %-98 %] 94 % (04/12 0834) Last BM Date: 03/07/14  Intake/Output from previous day: 04/11 0701 - 04/12 0700 In: -  Out: 770 [Urine:770] Intake/Output this shift:    PE: General- In NAD Abdomen-soft, nontender, nondistended, gas in colostomy with parastomal fullness, RLQ urostomy.  Lab Results:   Recent Labs  03/09/14 0918  WBC 5.2  HGB 13.7  HCT 39.1  PLT 203   BMET  Recent Labs  03/09/14 0918  NA 134*  K 3.8  CL 98  CO2 25  GLUCOSE 104*  BUN 16  CREATININE 1.36*  CALCIUM 9.1   PT/INR No results found for this basename: LABPROT, INR,  in the last 72 hours Comprehensive Metabolic Panel:    Component Value Date/Time   NA 134* 03/09/2014 0918   NA 139 03/07/2014 0753   NA 144 08/09/2013 0813   K 3.8 03/09/2014 0918   K 4.2 03/07/2014 0753   CL 98 03/09/2014 0918   CL 104 03/07/2014 0753   CO2 25 03/09/2014 0918   CO2 25 03/07/2014 0753   BUN 16 03/09/2014 0918   BUN 14 03/07/2014 0753   BUN 18 08/09/2013 0813   CREATININE 1.36* 03/09/2014 0918   CREATININE 1.08 03/07/2014 0753   GLUCOSE 104* 03/09/2014 0918   GLUCOSE 112* 03/07/2014 0753   GLUCOSE 87 08/09/2013 0813   CALCIUM 9.1 03/09/2014 0918   CALCIUM 8.7 03/07/2014 0753   AST 70* 02/26/2014 1428   AST 15 08/09/2013 0813   ALT 68* 02/26/2014 1428   ALT 18 08/09/2013 0813   ALKPHOS 58 02/26/2014 1428   ALKPHOS 105 08/09/2013 0813   BILITOT 0.7 02/26/2014 1428   BILITOT 0.3 08/09/2013 0813   PROT 8.3 02/26/2014 1428   PROT 6.6 08/09/2013 0813   PROT 6.6 04/05/2013 0607   ALBUMIN 3.4* 02/26/2014 1428   ALBUMIN 2.5* 04/05/2013 0607     Studies/Results: Ct Abdomen Pelvis W Contrast  03/08/2014   CLINICAL DATA:  Mid  abdominal pain with persistent obstruction. Multiple abdominal surgeries. Prostate cancer.  EXAM: CT ABDOMEN AND PELVIS WITH CONTRAST  TECHNIQUE: Multidetector CT imaging of the abdomen and pelvis was performed using the standard protocol following bolus administration of intravenous contrast.  CONTRAST:  126mL OMNIPAQUE IOHEXOL 300 MG/ML  SOLN  COMPARISON:  DG ABD 2 VIEWS dated 03/04/2014; DG ABD PORTABLE 2V dated 03/02/2014; CT ABD/PELVIS W CM dated 02/26/2014  FINDINGS: There is chronic scarring and paraseptal bleb formation at both lung bases. There is increased right lower lobe atelectasis. No significant pleural or pericardial effusion is present. There are diffuse coronary artery calcifications.  The liver, spleen, gallbladder, pancreas and adrenal glands appear stable. The pancreas is atrophied.  Right-sided nephro ureteral catheter remains in place, extending into a right lower quadrant ostomy. There is stable chronic renal cortical thinning and cyst formation. There is new left-sided hydronephrosis and hydroureter with delayed contrast excretion. The left ureter appears dilated to the right lower quadrant ostomy.  Again demonstrated are multiple dilated loops of small bowel, especially proximally. The distal small bowel appears relatively decompressed. There is a stable parastomal hernia surrounding the distal transverse colostomy in the left lower quadrant. This is  not suspected to reflect the level of the bowel obstruction. No bowel perforation or extraluminal fluid collection is identified.  In the right lower quadrant, there is omental soft tissue nodularity suspicious for peritoneal tumor. The largest component measures 2.4 x 4.3 cm on image 78. There is a smaller component more superiorly on image 71. There is also irregular soft tissue in the perineum adjacent to the prostate brachytherapy seeds which could reflect residual tumor. This measures 5.5 x 3.6 cm on image 96. Signs of chronic pelvic floor  relaxation are noted.  No acute or worrisome osseous findings are seen. There is a stable chronic T12 compression deformity.  IMPRESSION: 1. Persistent distal small bowel obstruction, possibly in the inferior pelvis as previously suggested. Recurrent tumor in the prostatectomy bed cannot be excluded. 2. There is suspicion of additional tumor in the right lower quadrant. 3. New obstruction of the distal left ureter near the ostomy. The right ureter remains decompressed by a nephro ureteral catheter. 4. Stable parastomal herniation of colon around the transverse colostomy.   Electronically Signed   By: Camie Patience M.D.   On: 03/08/2014 19:12   Dg Abd 2 Views  03/09/2014   CLINICAL DATA:  Followup partial small bowel obstruction.  EXAM: ABDOMEN - 2 VIEW  COMPARISON:  03/04/2014.  CT, 03/08/2014.  FINDINGS: Dilated loops of small bowel are noted centrally with air-fluid levels. Is apparent contrast within the colon. There is no free air.  The pigtail catheter curls in the right lower quadrant, unchanged. There multiple surgical vascular clips in the pelvis.  Bowel within a left lower anterior abdominal wall hernia is unchanged.  IMPRESSION: 1. Findings are consistent with a partial small bowel obstruction similar to the previous day's CT. No free air. No new abnormality.   Electronically Signed   By: Lajean Manes M.D.   On: 03/09/2014 10:19    Anti-infectives: Anti-infectives   Start     Dose/Rate Route Frequency Ordered Stop   02/27/14 1600  cefTRIAXone (ROCEPHIN) 1 g in dextrose 5 % 50 mL IVPB  Status:  Discontinued     1 g 100 mL/hr over 30 Minutes Intravenous Every 24 hours 02/26/14 2018 03/03/14 1111   02/26/14 2000  cefTRIAXone (ROCEPHIN) 1 g in dextrose 5 % 50 mL IVPB  Status:  Discontinued     1 g 100 mL/hr over 30 Minutes Intravenous Every 24 hours 02/26/14 1950 02/26/14 2017   02/26/14 1630  cefTRIAXone (ROCEPHIN) 1 g in dextrose 5 % 50 mL IVPB     1 g 100 mL/hr over 30 Minutes Intravenous   Once 02/26/14 1615 02/26/14 1743      Assessment Principal Problem:   Recurrent partial small bowel obstruction-Clinically better and tolerating small amounts of restricted diet.  Nodules noted on this CT concerning for tumor; point of partial obstruction appears to be in the right pelvis.  X-rays still show gaseous SB distension. Active Problems:   Hydronephrosis, right status post right nephrostomy tube placement   H/O colostomy with parastomal hernia   CKD (chronic kidney disease) stage 3, GFR 30-59 ml/min       LOS: 12 days   Plan:   Increase intake of restricted diet.  If he is able to do this and tolerate it, could be discharged.   He would need to follow up with his Urologist, Dr. Amalia Hailey regarding his hydroureter and new nodules concerning for recurrent prostate cancer.  Rhunette Croft Elodie Panameno 03/10/2014

## 2014-03-10 NOTE — Progress Notes (Signed)
[facility] PROGRESS NOTE  Michael Novak UEA:[id number] DOB: 06/15/1930 DOA: 02/26/2014  PCP: Estill Dooms, MD  Assessment/Plan: Principal Problem:   Small bowel obstruction Active Problems:   Hydronephrosis, right status post right nephrostomy tube placement   H/O colostomy   CKD (chronic kidney disease) stage 3, GFR 30-59 ml/min   SBO (small bowel obstruction)   Brief History 80 y/o male with PMH of COPD, prostate cancer status post radioactive seed implantation complicated by prostatic rectal fistula status post cystoprostatectomy with ileal conduit as well as proctocolectomy with colostomy as well as right-sided nephrostomy tube placed in July of 2013 and exchanged every 6 weeks. He presented with no colostomy output and was admitted with bowel obstruction.  1. Recurrent small bowel obstruction  - Patient was improving but then he started experiencing pain and discomfort on 4/9. He underwent CT on 4/10 which shows persistent SBO. Surgery is managing for most part. They want to continue trying conservative approach. If patient doesn't improve he will need to be transferred to another facility such as [facility]. NG was taken out by surgery 4/7. Seems to be tolerating diet so far.  2. New Left Hydronephrosis with history of Right hydronephrosis status post right nephrostomy tube placement; h/o cystoprostatectomy  -This is exchanged every 6 weeks, not due until mid-May.  -probable UTI; started on IV atx; cultures revealed. He is no longer on antibiotics. - CT from 4/10 shows new left hydronephrosis with distal ureteral dilatation. Creatinine was slightly elevated 4/11. Will repeat renal function today. He continues to make urine. His urologist is Dr. Amalia Hailey at [facility]. He will need to see his Urologist soon. Ct also suggests possible recurrence of tumor.  3. Chronic kidney disease stage III  -Stable for most part. See above as well.  4. COPD stable; cont home regimen   Code  Status: DNR Family Communication: d/w Patient. Son [phone] Disposition Plan: Patient has been slow to recover with setbacks. If he continues to tolerate current low residue diet he could be discharged 4/13.   Consultants:  General Surgery  Procedures:  None   Antibiotics:  Ceftriaxone 4/1--4/5  HPI/Subjective: Patient feels better today. Denies pain or nausea. Ambulating.  Objective: Filed Vitals:   03/10/14 0526  BP: 108/59  Pulse: 59  Temp: 98.3 F (36.8 C)  Resp: 16    Intake/Output Summary (Last 24 hours) at 03/10/14 1102 Last data filed at 03/10/14 0526  Gross per 24 hour  Intake      0 ml  Output    770 ml  Net   -770 ml   Filed Weights   02/26/14 2039  Weight: 85.458 kg (188 lb 6.4 oz)    Exam:   General:  alert  Cardiovascular: s1,s2 rrr  Respiratory: CTA BL  Abdomen: soft, colostomy present; Not distented today. BS present. Non tender. BS present.  Musculoskeletal: no LE edema   Data Reviewed: Basic Metabolic Panel:  Recent Labs Lab 03/04/14 0533 03/05/14 0552 03/07/14 0753 03/09/14 0918  NA 144  --  139 134*  K 3.6*  --  4.2 3.8  CL 107  --  104 98  CO2 26  --  25 25  GLUCOSE 124*  --  112* 104*  BUN 22  --  14 16  CREATININE 1.07 0.99 1.08 1.36*  CALCIUM 9.3  --  8.7 9.1   CBC:  Recent Labs Lab 03/07/14 0753 03/09/14 0918  WBC 4.7 5.2  HGB 13.0 13.7  HCT 37.5* 39.1  MCV 90.8 92.2  PLT 185 203    Studies: Ct Abdomen Pelvis W Contrast  03/08/2014   CLINICAL DATA:  Mid abdominal pain with persistent obstruction. Multiple abdominal surgeries. Prostate cancer.  EXAM: CT ABDOMEN AND PELVIS WITH CONTRAST  TECHNIQUE: Multidetector CT imaging of the abdomen and pelvis was performed using the standard protocol following bolus administration of intravenous contrast.  CONTRAST:  144mL OMNIPAQUE IOHEXOL 300 MG/ML  SOLN  COMPARISON:  DG ABD 2 VIEWS dated 03/04/2014; DG ABD PORTABLE 2V dated 03/02/2014; CT ABD/PELVIS W CM dated  02/26/2014  FINDINGS: There is chronic scarring and paraseptal bleb formation at both lung bases. There is increased right lower lobe atelectasis. No significant pleural or pericardial effusion is present. There are diffuse coronary artery calcifications.  The liver, spleen, gallbladder, pancreas and adrenal glands appear stable. The pancreas is atrophied.  Right-sided nephro ureteral catheter remains in place, extending into a right lower quadrant ostomy. There is stable chronic renal cortical thinning and cyst formation. There is new left-sided hydronephrosis and hydroureter with delayed contrast excretion. The left ureter appears dilated to the right lower quadrant ostomy.  Again demonstrated are multiple dilated loops of small bowel, especially proximally. The distal small bowel appears relatively decompressed. There is a stable parastomal hernia surrounding the distal transverse colostomy in the left lower quadrant. This is not suspected to reflect the level of the bowel obstruction. No bowel perforation or extraluminal fluid collection is identified.  In the right lower quadrant, there is omental soft tissue nodularity suspicious for peritoneal tumor. The largest component measures 2.4 x 4.3 cm on image 78. There is a smaller component more superiorly on image 71. There is also irregular soft tissue in the perineum adjacent to the prostate brachytherapy seeds which could reflect residual tumor. This measures 5.5 x 3.6 cm on image 96. Signs of chronic pelvic floor relaxation are noted.  No acute or worrisome osseous findings are seen. There is a stable chronic T12 compression deformity.  IMPRESSION: 1. Persistent distal small bowel obstruction, possibly in the inferior pelvis as previously suggested. Recurrent tumor in the prostatectomy bed cannot be excluded. 2. There is suspicion of additional tumor in the right lower quadrant. 3. New obstruction of the distal left ureter near the ostomy. The right ureter  remains decompressed by a nephro ureteral catheter. 4. Stable parastomal herniation of colon around the transverse colostomy.   Electronically Signed   By: Camie Patience M.D.   On: 03/08/2014 19:12   Dg Abd 2 Views  03/10/2014   CLINICAL DATA:  Partial small bowel obstruction  EXAM: ABDOMEN - 2 VIEW  COMPARISON:  Abdomen radiographs 03/09/2014 and CT abdomen and pelvis 03/08/2014.  FINDINGS: No focal opacities at the lung bases. Right-sided nephroureteral catheter which exits her right lower quadrant ostomy appears stable compared to CT abdomen pelvis 03/08/2014. Contrast material is seen within the colon from prior CT. Colon projecting lateral to the expected location of the left lower quadrant is consistent with the known colon-containing parastomal hernia. There are dilated small bowel loops, measuring up to 4.8 cm in the left upper quadrant and up to 6 cm in the right lower quadrant.  IMPRESSION: 1. Persistent small bowel obstruction. No significant change in small bowel distention.  2.  Stable position of right nephroureteral catheter.   Electronically Signed   By: Curlene Dolphin M.D.   On: 03/10/2014 09:54   Dg Abd 2 Views  03/09/2014   CLINICAL DATA:  Followup partial small bowel obstruction.  EXAM: ABDOMEN - 2 VIEW  COMPARISON:  03/04/2014.  CT, 03/08/2014.  FINDINGS: Dilated loops of small bowel are noted centrally with air-fluid levels. Is apparent contrast within the colon. There is no free air.  The pigtail catheter curls in the right lower quadrant, unchanged. There multiple surgical vascular clips in the pelvis.  Bowel within a left lower anterior abdominal wall hernia is unchanged.  IMPRESSION: 1. Findings are consistent with a partial small bowel obstruction similar to the previous day's CT. No free air. No new abnormality.   Electronically Signed   By: Lajean Manes M.D.   On: 03/09/2014 10:19    Scheduled Meds: . docusate sodium  100 mg Oral BID  . enoxaparin (LOVENOX) injection  40 mg  Subcutaneous Q24H  . feeding supplement (RESOURCE BREEZE)  1 Container Oral TID BM  . polyethylene glycol  17 g Oral Daily  . tiotropium  18 mcg Inhalation Daily   Continuous Infusions:    Principal Problem:   Small bowel obstruction Active Problems:   Hydronephrosis, right status post right nephrostomy tube placement   H/O colostomy   CKD (chronic kidney disease) stage 3, GFR 30-59 ml/min   SBO (small bowel obstruction)   Hydronephrosis, left   [facility] Pager [phone]  If 7PM-7AM, please contact night-coverage at [email or web address], password [facility] 03/10/2014, 11:02 AM  LOS: 12 days

## 2014-03-11 LAB — BASIC METABOLIC PANEL
BUN: 20 mg/dL (ref 6–23)
CHLORIDE: 102 meq/L (ref 96–112)
CO2: 25 mEq/L (ref 19–32)
Calcium: 8.6 mg/dL (ref 8.4–10.5)
Creatinine, Ser: 1.59 mg/dL — ABNORMAL HIGH (ref 0.50–1.35)
GFR, EST AFRICAN AMERICAN: 45 mL/min — AB (ref >90)
GFR, EST NON AFRICAN AMERICAN: 39 mL/min — AB (ref >90)
Glucose, Bld: 107 mg/dL — ABNORMAL HIGH (ref 70–99)
POTASSIUM: 3.9 meq/L (ref 3.7–5.3)
SODIUM: 137 meq/L (ref 137–147)

## 2014-03-11 MED ORDER — POLYETHYLENE GLYCOL 3350 17 G PO PACK: 17.0000 g | PACK | Freq: Every day | ORAL | Status: AC

## 2014-03-11 MED ORDER — DSS 100 MG PO CAPS: 100.0000 mg | ORAL_CAPSULE | Freq: Two times a day (BID) | ORAL | Status: AC

## 2014-03-11 NOTE — Progress Notes (Signed)
Nutrition Education Note  Surgeon advised pt to be on low residue diet for the next 2 weeks. Received paged from RN regarding wife's concerns/questions regarding this diet. Used teach back method to educate pt and wife on low residue diet. Sample meal plans discussed. Sources of fiber in the diet reviewed. Handouts provided with RD contact information. Expect good compliance.   Mikey College MS, [place], [place] Pager [phone] After Hours Pager

## 2014-03-11 NOTE — Progress Notes (Signed)
Subjective: He has allot of gas and some liquid stool in the ostomy, he is taking a regular diet, I can't really tell how much he is eating right now.  He thinks he may go home today, says he is walking well and knows he needs to see Dr. Amalia Hailey again.  Objective: Vital signs in last 24 hours: Temp:  [97.8 F (36.6 C)-98.3 F (36.8 C)] 98.3 F (36.8 C) (04/13 0516) Pulse Rate:  [60-61] 60 (04/13 0516) Resp:  [16] 16 (04/13 0516) BP: (97-109)/(53-65) 101/61 mmHg (04/13 0516) SpO2:  [92 %-94 %] 92 % (04/13 0516) Last BM Date: 03/07/14 Urine only I/O recorded. Soft diet started 03/09/14 Afebrile, VSS Creatinine 1.59, Film yesterday shows: 1. Persistent small bowel obstruction. No significant change in small bowel distention. 2. Stable position of right nephroureteral catheter.    Intake/Output from previous day: 03/29/2023 0701 - 04/13 0700 In: -  Out: 750 [Urine:750] Intake/Output this shift:    General appearance: alert, cooperative and no distress, up in the chair. GI:  BS hyperactive, gas and liquid in the ostomy bag, good urine flow from ileostomy  Lab Results:   Recent Labs  03/09/14 0918  WBC 5.2  HGB 13.7  HCT 39.1  PLT 203    BMET  Recent Labs  2014-03-28 1104 03/11/14 0441  NA 136* 137  K 4.3 3.9  CL 101 102  CO2 26 25  GLUCOSE 115* 107*  BUN 19 20  CREATININE 1.64* 1.59*  CALCIUM 8.9 8.6   PT/INR No results found for this basename: LABPROT, INR,  in the last 72 hours  No results found for this basename: AST, ALT, ALKPHOS, BILITOT, PROT, ALBUMIN,  in the last 168 hours   Lipase     Component Value Date/Time   LIPASE 25 02/26/2014 1428     Studies/Results: Dg Abd 2 Views  03/28/14   CLINICAL DATA:  Partial small bowel obstruction  EXAM: ABDOMEN - 2 VIEW  COMPARISON:  Abdomen radiographs 03/09/2014 and CT abdomen and pelvis 03/08/2014.  FINDINGS: No focal opacities at the lung bases. Right-sided nephroureteral catheter which exits her right  lower quadrant ostomy appears stable compared to CT abdomen pelvis 03/08/2014. Contrast material is seen within the colon from prior CT. Colon projecting lateral to the expected location of the left lower quadrant is consistent with the known colon-containing parastomal hernia. There are dilated small bowel loops, measuring up to 4.8 cm in the left upper quadrant and up to 6 cm in the right lower quadrant.  IMPRESSION: 1. Persistent small bowel obstruction. No significant change in small bowel distention.  2.  Stable position of right nephroureteral catheter.   Electronically Signed   By: Curlene Dolphin M.D.   On: 2014-03-28 09:54   Dg Abd 2 Views  03/09/2014   CLINICAL DATA:  Followup partial small bowel obstruction.  EXAM: ABDOMEN - 2 VIEW  COMPARISON:  03/04/2014.  CT, 03/08/2014.  FINDINGS: Dilated loops of small bowel are noted centrally with air-fluid levels. Is apparent contrast within the colon. There is no free air.  The pigtail catheter curls in the right lower quadrant, unchanged. There multiple surgical vascular clips in the pelvis.  Bowel within a left lower anterior abdominal wall hernia is unchanged.  IMPRESSION: 1. Findings are consistent with a partial small bowel obstruction similar to the previous day's CT. No free air. No new abnormality.   Electronically Signed   By: Lajean Manes M.D.   On: 03/09/2014 10:19  Medications: . docusate sodium  100 mg Oral BID  . enoxaparin (LOVENOX) injection  40 mg Subcutaneous Q24H  . feeding supplement (RESOURCE BREEZE)  1 Container Oral TID BM  . polyethylene glycol  17 g Oral Daily  . tiotropium  18 mcg Inhalation Daily    Assessment/Plan 1. Recurrent SBO , hospitalization 4/27-5/12/14, now readmitted on 02/26/14 with  SBO, prior Exploratory lap for SBO and lysis of adhesions. 2010  2. Ureteral stricture, chronic nephroureteral stenting  Has been followed by Dr. Mattie Marlin at [facility].  He is getting a stent replaced every 6 weeks.  3. Parastomal  hernia at left colostomy.  This has been repaired at least once at [facility].  This is a large parastomal hernia, which is not tender.  4. COPD  5. Hypercholesterolemia  6. History of prostate cancer  Had seed implant around 9485, but then complications from the seeds.  7. Rectouretheral fistula  S/p cystectomy, ileal conduit, ostomy - 2002 - [place]  8. History of coronary artery disease  9. 5.0 cm AAA  10. Mildly elevated creatinine - 02/27/2104 - 1.4  11. History of psoriasis.   Plan:  Dr. Redmond Pulling will see later, film yesterday shows ongoing obstruction, but he has some stool and allot of gas coming thru.  Dr. Zella Richer discussed going home yesterday, and follow up with Dr. Amalia Hailey.    LOS: 13 days    Earnstine Regal 03/11/2014

## 2014-03-11 NOTE — Progress Notes (Signed)
Denies abd pain. On low residue diet  Alert, sitting in chair.  Soft, nt, lots of air in colostomy bag with some stool  abd xray - still some dilated loops of SB but not as many, contrast in colon.   Clinically looks good Ok for discharge from my point of view i advised pt he should remain on low residue diet for 2 weeks and eat smaller meals than 3 bulky meals  Leighton Ruff. Redmond Pulling, MD, FACS General, Bariatric, & Minimally Invasive Surgery [facility], [place]

## 2014-03-11 NOTE — Discharge Summary (Signed)
[facility]  Physician Discharge Summary   Patient ID: Michael Novak MRN: [id number] DOB/AGE: [birth year] 80 y.o.  Admit date: 02/26/2014 Discharge date: 03/11/2014  PCP: Estill Dooms, MD  DISCHARGE DIAGNOSES:  Principal Problem:   Small bowel obstruction Active Problems:   Hydronephrosis, right status post right nephrostomy tube placement   H/O colostomy   CKD (chronic kidney disease) stage 3, GFR 30-59 ml/min   SBO (small bowel obstruction)   Hydronephrosis, left   RECOMMENDATIONS FOR OUTPATIENT FOLLOW UP: 1. Patient to see Dr. Dr. Amalia Hailey as soon as possible for new left hydronephrosis. 2. Needs to follow up at tertiary center for further problems with bowel obstruction 3. Needs renal function checked as OP in few days.  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 He presented with no colostomy output and was admitted with bowel obstruction.  Consultations:  General Surgery  Procedures:  None  HOSPITAL COURSE:   Recurrent small bowel obstruction  Patient was admitted and NG was placed. He refused to go to tertiary setting as was recommended by general surgeon. He also refused surgical intervention. Patient was managed conservatively by surgery. NG was removed on 4/7. Diet was slowly advanced. He had a setback on 4/9 with increased discomfort and distension. CT was repeated and showed persistent obstruction. Surgery felt he could not be operated here. He was however passing stool and not experiencing any nausea or vomiting. So his diet was  adjusted to Low Residue and he was observed. He improved. Abdominal x ray's were repeated. Clinically he appears to be stable. It is quite possible that surgery may decide that he is ready for discharge. In that case he will need to follow up at a tertiary center for further issues with his bowel obstruction, preferably at [place] Left Hydronephrosis with history of Right hydronephrosis status post right nephrostomy tube placement; h/o cystoprostatectomy  His right nephrostomy tube is exchanged every 6 weeks and is not due until mid-May. He was making urine. Repeat CT done for SBO revealed new left hydronephrosis. Renal function is slightly worse. But improved today with hydration. He and son know that he needs close follow up with Dr. Amalia Hailey, his Urologist at [facility]. There is also suspicion of recurrence of cancer on the CT. Please see report below.  UTI He probably had a UTI. He was started on IV atx and he has completed a course. Cultures grew E coli.   Chronic kidney disease stage III  Stable for most part. See above as well. See labs below.  COPD  Stable; cont home regimen   Patient has other medical problems such as AAA, history of prostate cancer, CAD, psoriasis all of which remained stable during this hospitalization.   If cleared by surgery he will be discharged today. Discussed with patient and his son.   PERTINENT LABS:  The results of significant diagnostics from this hospitalization (including imaging, microbiology, ancillary and laboratory) are listed below for reference.     Labs: Basic Metabolic Panel:  Recent Labs Lab 03/05/14 0552 03/07/14 0753 03/09/14 [id number] 03/10/14 1104 03/11/14 0441  NA  --  139 134* 136* 137  K  --  4.2 3.8 4.3 3.9  CL  --  104 98 101 102  CO2  --  25 25 26 25   GLUCOSE  --  112* 104* 115* 107*  BUN  --  14 16 19 20   CREATININE 0.99 1.08 1.36* 1.64* 1.59*  CALCIUM  --  8.7 9.1 8.9 8.6   CBC:  Recent Labs Lab 03/07/14 0753  03/09/14 0918  WBC 4.7 5.2  HGB 13.0 13.7  HCT 37.5* 39.1  MCV 90.8 92.2  PLT 185 203   IMAGING STUDIES Ct Abdomen Pelvis W Contrast  03/08/2014   CLINICAL DATA:  Mid abdominal pain with persistent obstruction. Multiple abdominal surgeries. Prostate cancer.  EXAM: CT ABDOMEN AND PELVIS WITH CONTRAST  TECHNIQUE: Multidetector CT imaging of the abdomen and pelvis was performed using the standard protocol following bolus administration of intravenous contrast.  CONTRAST:  138mL OMNIPAQUE IOHEXOL 300 MG/ML  SOLN  COMPARISON:  DG ABD 2 VIEWS dated 03/04/2014; DG ABD PORTABLE 2V dated 03/02/2014; CT ABD/PELVIS W CM dated 02/26/2014  FINDINGS: There is chronic scarring and paraseptal bleb formation at both lung bases. There is increased right lower lobe atelectasis. No significant pleural or pericardial effusion is present. There are diffuse coronary artery calcifications.  The liver, spleen, gallbladder, pancreas and adrenal glands appear stable. The pancreas is atrophied.  Right-sided nephro ureteral catheter remains in place, extending into a right lower quadrant ostomy. There is stable chronic renal cortical thinning and cyst formation. There is new left-sided hydronephrosis and hydroureter with delayed contrast excretion. The left ureter appears dilated to the right lower quadrant ostomy.  Again demonstrated are multiple dilated loops of small bowel, especially proximally. The distal small bowel appears relatively decompressed. There is a stable parastomal hernia surrounding the distal transverse colostomy in the left lower quadrant. This is not suspected to reflect the level of the bowel obstruction. No bowel perforation or extraluminal fluid collection is identified.  In the right lower quadrant, there is omental soft tissue nodularity suspicious for peritoneal tumor. The largest component measures 2.4 x 4.3 cm on image 78. There is a smaller component more superiorly on image 71. There is also irregular soft  tissue in the perineum adjacent to the prostate brachytherapy seeds which could reflect residual tumor. This measures 5.5 x 3.6 cm on image 96. Signs of chronic pelvic floor relaxation are noted.  No acute or worrisome osseous findings are seen. There is a stable chronic T12 compression deformity.  IMPRESSION: 1. Persistent distal small bowel obstruction, possibly in the inferior pelvis as previously suggested. Recurrent tumor in the prostatectomy bed cannot be excluded. 2. There is suspicion of additional tumor in the right lower quadrant. 3. New obstruction of the distal left ureter near the ostomy. The right ureter remains decompressed by a nephro ureteral catheter. 4. Stable parastomal herniation of colon around the transverse colostomy.   Electronically Signed   By: Camie Patience M.D.   On: 03/08/2014 19:12   Ct Abdomen Pelvis W Contrast  02/26/2014   CLINICAL DATA:  Abdominal pain, nausea and constipation. History of prostate carcinoma and prior cystectomy.  EXAM: CT ABDOMEN AND PELVIS WITH CONTRAST  TECHNIQUE: Multidetector CT imaging of the abdomen and pelvis was performed using the standard protocol following bolus administration of intravenous contrast.  CONTRAST:  12mL OMNIPAQUE IOHEXOL 300 MG/ML  SOLN  COMPARISON:  SP PERC TUBE CHANGE W/CM dated 02/07/2014; IR NEPHROSTOGRAM*R* dated 12/27/2013; IR PERC  NEPHROSTOMY*R* dated 12/17/2013; CT ABD/PELVIS W CM dated 03/25/2013; CT ABD/PELVIS W CM dated 11/08/2012  FINDINGS: Recurrent small bowel obstruction is identified with multiple dilated small bowel loops identified. Maximal caliber of small bowel in the central abdomen approaches 5 cm. As small bowel is followed inferiorly, there is enhancement of loops in the lower pelvis including a loop that extends into what appears to be a right-sided perirectal/perineal hernia extending into the perirectal fat. At this level, the patient also has brachytherapy seeds identified in the region of the prostate gland. It  is suspected that there may be strictured small bowel in this region as well.  No evidence of free intraperitoneal air, fistula or focal abscess. There is a stable chronic left-sided parastomal ventral hernia containing colon. Right-sided nephroureteral catheter enters an ileostomy and ascends up the right ureter. There is no evidence of hydronephrosis. The right kidney is chronically atrophic compared to the left.  There is a stable 5 cm infrarenal abdominal aortic aneurysm without evidence of rupture. The liver shows mild steatosis without focal mass. No biliary ductal dilatation is seen. The pancreas, spleen and adrenal glands are unremarkable. No soft tissue masses or enlarged lymph nodes are seen. Bony structures show chronic and stable compression fracture of the L1 vertebral body.  IMPRESSION: Recurrent high-grade small bowel obstruction without evidence of bowel perforation. Transition point in small bowel appears to be deep in the inferior pelvis at the level of some irregular appearing small bowel loops near brachytherapy seeds as well as a right-sided perirectal hernia. Bowel in this region may be relatively strictured.   Electronically Signed   By: Aletta Edouard M.D.   On: 02/26/2014 17:14   Dg Abd 2 Views  03/10/2014   CLINICAL DATA:  Partial small bowel obstruction  EXAM: ABDOMEN - 2 VIEW  COMPARISON:  Abdomen radiographs 03/09/2014 and CT abdomen and pelvis 03/08/2014.  FINDINGS: No focal opacities at the lung bases. Right-sided nephroureteral catheter which exits her right lower quadrant ostomy appears stable compared to CT abdomen pelvis 03/08/2014. Contrast material is seen within the colon from prior CT. Colon projecting lateral to the expected location of the left lower quadrant is consistent with the known colon-containing parastomal hernia. There are dilated small bowel loops, measuring up to 4.8 cm in the left upper quadrant and up to 6 cm in the right lower quadrant.  IMPRESSION: 1.  Persistent small bowel obstruction. No significant change in small bowel distention.  2.  Stable position of right nephroureteral catheter.   Electronically Signed   By: Curlene Dolphin M.D.   On: 03/10/2014 09:54   Dg Abd 2 Views  03/09/2014   CLINICAL DATA:  Followup partial small bowel obstruction.  EXAM: ABDOMEN - 2 VIEW  COMPARISON:  03/04/2014.  CT, 03/08/2014.  FINDINGS: Dilated loops of small bowel are noted centrally with air-fluid levels. Is apparent contrast within the colon. There is no free air.  The pigtail catheter curls in the right lower quadrant, unchanged. There multiple surgical vascular clips in the pelvis.  Bowel within a left lower anterior abdominal wall hernia is unchanged.  IMPRESSION: 1. Findings are consistent with a partial small bowel obstruction similar to the previous day's CT. No free air. No new abnormality.   Electronically Signed   By: Lajean Manes M.D.   On: 03/09/2014 10:19   Dg Abd 2 Views  03/04/2014   CLINICAL DATA:  Small bowel obstruction follow-up.  EXAM: ABDOMEN - 2 VIEW  COMPARISON:  DG ABD  PORTABLE 2V dated 03/02/2014; DG ABD 2 VIEWS dated 02/27/2014; CT ABD/PELVIS W CM dated 02/26/2014  FINDINGS: NG tube noted with its tip projected over stomach. Right ureteral stent noted in stable position. Continued improvement of bone bowel gas pattern. No free air. Aortoiliac atherosclerotic vascular disease with abdominal aortic aneurysm noted.  IMPRESSION: 1. NG tube noted projected over stomach. Right ureteral stent again noted.  2. Continued resolution small bowel distention.  3. Abdominal aortic aneurysm again noted.   Electronically Signed   By: Maisie Fus  Register   On: 03/04/2014 11:47   Dg Abd 2 Views  02/28/2014   CLINICAL DATA:  Small bowel obstruction.  EXAM: ABDOMEN - 2 VIEW  COMPARISON:  DG ABD 2 VIEWS dated 02/27/2014; CT ABD/PELVIS W CM dated 02/26/2014  FINDINGS: Right ureteral stent in stable position. NG tube noted projected over stomach. Mild basilar atelectasis.  Progressive distention of small-bowel loops with small bowel loop diameter measuring up to 7 cm. Contrast is noted colon. Large left lower quadrant lateral abdominal wall hernia is present. No free air. Surgical clips and radiation seeds at the pelvis. Degenerative changes lumbar spine and both hips.  IMPRESSION: 1. Progressive distention of small bowel. Contrast is noted in the colon. These findings are consistent with worsening adynamic ileus. Partial small bowel obstruction cannot be excluded. 2. Right ureteral stent is in stable position. NG tube noted with tip projected over stomach .   Electronically Signed   By: Maisie Fus  Register   On: 02/28/2014 08:01   Dg Abd 2 Views  02/27/2014   CLINICAL DATA:  80 year old male with diffuse abdominal pain and distension. Recent small bowel obstruction. Initial encounter.  EXAM: ABDOMEN - 2 VIEW  COMPARISON:  CT Abdomen and Pelvis 02/26/2014.  FINDINGS: Right ureteral stent re- identified and grossly stable. Persistent gas-filled small bowel loops in the mid abdomen, measuring up to 67 mm diameter (50 mm yesterday). Nevertheless, oral contrast has reached the ascending colon since yesterday. Numerous surgical clips in the pelvis. On upright views, small pleural effusions are evident without pneumoperitoneum. Stable visualized osseous structures.  IMPRESSION: 1. Partial small bowel obstruction, with interval passage of oral contrast to the ascending colon, but persistently dilated small bowel loops up to 67 mm diameter. 2. No free air identified.  Small pleural effusions.   Electronically Signed   By: Augusto Gamble M.D.   On: 02/27/2014 12:15   Dg Abd Portable 2v  03/02/2014   CLINICAL DATA:  Followup small bowel obstruction  EXAM: PORTABLE ABDOMEN - 2 VIEW  COMPARISON:  02/28/2014  FINDINGS: Contrast material remains in the colon. The degree of small bowel dilatation has improved somewhat in the interval from the prior exam. A nasogastric catheter and right-sided retrograde  nephrostomy catheter remain. No free air is seen. Vascular calcifications are noted.  IMPRESSION: Continued improved appearance of the bowel gas pattern. No obstructive changes are noted.   Electronically Signed   By: Alcide Clever M.D.   On: 03/02/2014 07:34    DISCHARGE EXAMINATION: Filed Vitals:   03/10/14 1434 03/10/14 2159 03/11/14 0516 03/11/14 0847  BP: 97/53 109/65 101/61   Pulse: 61 61 60 60  Temp: 97.8 F (36.6 C) 98.2 F (36.8 C) 98.3 F (36.8 C)   TempSrc: Oral Oral Oral   Resp: 16 16 16 16   Height:      Weight:      SpO2: 94% 92% 92% 93%   General appearance: alert, cooperative, appears stated age and no distress Resp:  clear to auscultation bilaterally Cardio: regular rate and rhythm, S1, S2 normal, no murmur, click, rub or gallop GI: soft, non-tender; bowel sounds present; no masses,  no organomegaly. Ostomy and nephrostomy tube noted.  DISPOSITION: Home  Discharge Orders   Future Appointments Provider Department Dept Phone   03/21/2014 1:30 PM [facility] [phone]   04/29/2014 8:30 AM [facility] [place] [phone]   05/01/2014 9:00 AM [facility] [phone]   Eat a light meal the night before the exam. Nothing to eat or drink for at least 8 hours before exam. No gum chewing, or smoking the morning of the exam. Please take your morning medications with small sips of water, especially blood pressure medication *Very Important* Please wear 2 piece clothing   05/01/2014 10:20 AM Viann Fish, NP [facility] [phone]   05/01/2014 11:30 AM Estill Dooms, MD [facility] [phone]   Future Orders Complete By Expires   Call MD for:  persistant nausea and vomiting  As directed    Call MD for:  severe uncontrolled pain  As directed    Call MD for:  temperature >100.4  As directed    Discharge diet:  As directed     Discharge instructions  As directed    Increase activity slowly  As directed       ALLERGIES:  Allergies  Allergen Reactions  . Augmentin [Amoxicillin-Pot Clavulanate] Other (See Comments)    Reaction unknown.    Current Discharge Medication List    START taking these medications   Details  docusate sodium 100 MG CAPS Take 100 mg by mouth 2 (two) times daily. Qty: 60 capsule, Refills: 0    polyethylene glycol (MIRALAX / GLYCOLAX) packet Take 17 g by mouth daily. Qty: 30 each, Refills: 2      CONTINUE these medications which have NOT CHANGED   Details  acetaminophen (TYLENOL) 500 MG tablet Take 1,000 mg by mouth every 6 (six) hours as needed. For pain    albuterol (PROVENTIL HFA;VENTOLIN HFA) 108 (90 BASE) MCG/ACT inhaler Inhale 2 puffs into the lungs every 4 (four) hours as needed for shortness of breath. Qty: 3 Inhaler, Refills: 1    aspirin EC 81 MG tablet Take 81 mg by mouth at bedtime.     buPROPion (WELLBUTRIN SR) 150 MG 12 hr tablet Take 1 tablet (150 mg total) by mouth 3 (three) times daily. Qty: 90 tablet, Refills: 0    Calcium-Magnesium-Vitamin D (CALCIUM MAGNESIUM PO) Take 1 tablet by mouth every morning.     !! gabapentin (NEURONTIN) 100 MG capsule Take 200 mg by mouth at bedtime. In addition to 300mg , per his dermatologist    !! gabapentin (NEURONTIN) 300 MG capsule Take 300 mg by mouth 3 (three) times daily.    glucosamine-chondroitin 500-400 MG tablet Take 1 tablet by mouth 2 (two) times daily.     ketoconazole (NIZORAL) 2 % shampoo Apply 1 application topically 2 (two) times a week. Qty: 120 mL, Refills: 2    Multiple Vitamin (MULTIVITAMIN WITH MINERALS) TABS Take 1 tablet by mouth every morning.     omega-3 acid ethyl esters (LOVAZA) 1 G capsule Take 1 g by mouth 2 (two) times daily.    selenium sulfide (SELSUN) 2.5 % shampoo Apply 1 application topically daily as needed (Applies to scalp for dandruff or itching.).     simvastatin (ZOCOR) 20 MG  tablet Take 20  mg by mouth at bedtime. One nightly to control cholesterol    sodium chloride (OCEAN) 0.65 % nasal spray Place 1 spray into the nose daily as needed. For nasal decongestant    sorbitol 70 % solution Take 15 mLs by mouth daily as needed. constipation Qty: 473 mL, Refills: 2    tiotropium (SPIRIVA) 18 MCG inhalation capsule Place 1 capsule (18 mcg total) into inhaler and inhale daily. Qty: 90 capsule, Refills: 0    triamcinolone cream (KENALOG) 0.1 % Apply 1 application topically as needed (to scalp for dandruff).    vitamin C (ASCORBIC ACID) 500 MG tablet Take 500 mg by mouth daily.    methenamine (HIPREX) 1 G tablet Take 1 tablet (1 g total) by mouth daily as needed. Taken if patient has kidney infection. Alternating with Cipro. Qty: 90 tablet, Refills: 0     !! - Potential duplicate medications found. Please discuss with provider.    STOP taking these medications     nitrofurantoin (MACRODANTIN) 50 MG capsule        Follow-up Information   Follow up with GREEN, Viviann Spare, MD. Schedule an appointment as soon as possible for a visit in 1 week. (post hospitalization follow up)    Specialty:  Internal Medicine   Contact information:   [place] [address] [phone]       Follow up with Domingo Pulse, MD. Schedule an appointment as soon as possible for a visit in 4 days. (for new left hydronephrosis)    Specialty:  Urology   Contact information:   [place] [address] [phone]       TOTAL DISCHARGE TIME: 71 mins  [name]  [facility] Pager [phone]  03/11/2014, 12:45 PM

## 2014-03-13 ENCOUNTER — Ambulatory Visit: Payer: Medicare Other | Admitting: Internal Medicine

## 2014-03-21 ENCOUNTER — Ambulatory Visit (HOSPITAL_COMMUNITY): Payer: Medicare Other

## 2014-04-29 ENCOUNTER — Other Ambulatory Visit: Payer: Medicare Other

## 2014-04-29 DEATH — deceased

## 2014-04-30 ENCOUNTER — Ambulatory Visit: Payer: Medicare Other | Admitting: Vascular Surgery

## 2014-04-30 ENCOUNTER — Other Ambulatory Visit (HOSPITAL_COMMUNITY): Payer: Medicare Other

## 2014-05-01 ENCOUNTER — Ambulatory Visit: Payer: Medicare Other | Admitting: Family

## 2014-05-01 ENCOUNTER — Other Ambulatory Visit (HOSPITAL_COMMUNITY): Payer: Medicare Other

## 2014-05-01 ENCOUNTER — Ambulatory Visit: Payer: Medicare Other | Admitting: Internal Medicine
